# Patient Record
Sex: Female | Born: 1950 | Race: Black or African American | Hispanic: No | Smoking: Former smoker
Health system: Southern US, Community
[De-identification: ages and names within clinical notes are randomized; demographics above are authoritative.]

## PROBLEM LIST (undated history)

## (undated) DIAGNOSIS — I509 Heart failure, unspecified: Secondary | ICD-10-CM

## (undated) DIAGNOSIS — C50919 Malignant neoplasm of unspecified site of unspecified female breast: Secondary | ICD-10-CM

## (undated) DIAGNOSIS — M1990 Unspecified osteoarthritis, unspecified site: Secondary | ICD-10-CM

## (undated) DIAGNOSIS — F419 Anxiety disorder, unspecified: Secondary | ICD-10-CM

## (undated) DIAGNOSIS — F329 Major depressive disorder, single episode, unspecified: Secondary | ICD-10-CM

## (undated) DIAGNOSIS — Z923 Personal history of irradiation: Secondary | ICD-10-CM

## (undated) DIAGNOSIS — F32A Depression, unspecified: Secondary | ICD-10-CM

## (undated) DIAGNOSIS — E7800 Pure hypercholesterolemia, unspecified: Secondary | ICD-10-CM

## (undated) DIAGNOSIS — R7401 Elevation of levels of liver transaminase levels: Secondary | ICD-10-CM

## (undated) DIAGNOSIS — K3184 Gastroparesis: Secondary | ICD-10-CM

## (undated) DIAGNOSIS — K5090 Crohn's disease, unspecified, without complications: Secondary | ICD-10-CM

## (undated) DIAGNOSIS — G2581 Restless legs syndrome: Secondary | ICD-10-CM

## (undated) DIAGNOSIS — R7402 Elevation of levels of lactic acid dehydrogenase (LDH): Secondary | ICD-10-CM

## (undated) DIAGNOSIS — M545 Low back pain, unspecified: Secondary | ICD-10-CM

## (undated) DIAGNOSIS — N814 Uterovaginal prolapse, unspecified: Secondary | ICD-10-CM

## (undated) DIAGNOSIS — R740 Nonspecific elevation of levels of transaminase and lactic acid dehydrogenase [LDH]: Secondary | ICD-10-CM

## (undated) DIAGNOSIS — I10 Essential (primary) hypertension: Secondary | ICD-10-CM

## (undated) DIAGNOSIS — G459 Transient cerebral ischemic attack, unspecified: Secondary | ICD-10-CM

## (undated) DIAGNOSIS — R350 Frequency of micturition: Secondary | ICD-10-CM

## (undated) DIAGNOSIS — E569 Vitamin deficiency, unspecified: Secondary | ICD-10-CM

## (undated) HISTORY — PX: ARTHROPLASTY: SHX135

## (undated) HISTORY — DX: Crohn's disease, unspecified, without complications: K50.90

## (undated) HISTORY — DX: Elevation of levels of liver transaminase levels: R74.01

## (undated) HISTORY — DX: Personal history of irradiation: Z92.3

## (undated) HISTORY — DX: Restless legs syndrome: G25.81

## (undated) HISTORY — DX: Unspecified osteoarthritis, unspecified site: M19.90

## (undated) HISTORY — DX: Vitamin deficiency, unspecified: E56.9

## (undated) HISTORY — PX: TOTAL KNEE ARTHROPLASTY: SHX125

## (undated) HISTORY — DX: Elevation of levels of lactic acid dehydrogenase (LDH): R74.02

## (undated) HISTORY — DX: Gastroparesis: K31.84

## (undated) HISTORY — DX: Nonspecific elevation of levels of transaminase and lactic acid dehydrogenase (ldh): R74.0

## (undated) HISTORY — DX: Major depressive disorder, single episode, unspecified: F32.9

## (undated) HISTORY — DX: Low back pain, unspecified: M54.50

## (undated) HISTORY — PX: WRIST SURGERY: SHX841

## (undated) HISTORY — DX: Malignant neoplasm of unspecified site of unspecified female breast: C50.919

## (undated) HISTORY — DX: Heart failure, unspecified: I50.9

## (undated) HISTORY — DX: Depression, unspecified: F32.A

## (undated) HISTORY — DX: Uterovaginal prolapse, unspecified: N81.4

## (undated) HISTORY — DX: Low back pain: M54.5

## (undated) HISTORY — DX: Pure hypercholesterolemia, unspecified: E78.00

## (undated) HISTORY — DX: Essential (primary) hypertension: I10

## (undated) HISTORY — DX: Transient cerebral ischemic attack, unspecified: G45.9

## (undated) HISTORY — DX: Anxiety disorder, unspecified: F41.9

## (undated) HISTORY — DX: Frequency of micturition: R35.0

---

## 1995-09-08 HISTORY — PX: ABDOMINAL HYSTERECTOMY: SHX81

## 1995-09-08 HISTORY — PX: RADICAL HYSTERECTOMY: SHX2283

## 1997-12-26 ENCOUNTER — Encounter: Admission: RE | Admit: 1997-12-26 | Discharge: 1997-12-26 | Payer: Self-pay | Admitting: Family Medicine

## 1998-02-20 ENCOUNTER — Ambulatory Visit: Admission: RE | Admit: 1998-02-20 | Discharge: 1998-02-20 | Payer: Self-pay | Admitting: Gynecology

## 1998-08-13 ENCOUNTER — Other Ambulatory Visit: Admission: RE | Admit: 1998-08-13 | Discharge: 1998-08-13 | Payer: Self-pay | Admitting: Gynecology

## 1998-08-13 ENCOUNTER — Ambulatory Visit: Admission: RE | Admit: 1998-08-13 | Discharge: 1998-08-13 | Payer: Self-pay | Admitting: Gynecology

## 1998-08-16 ENCOUNTER — Encounter: Admission: RE | Admit: 1998-08-16 | Discharge: 1998-08-16 | Payer: Self-pay | Admitting: Family Medicine

## 1998-09-04 ENCOUNTER — Encounter: Admission: RE | Admit: 1998-09-04 | Discharge: 1998-09-04 | Payer: Self-pay | Admitting: Sports Medicine

## 1999-03-03 ENCOUNTER — Encounter: Admission: RE | Admit: 1999-03-03 | Discharge: 1999-03-03 | Payer: Self-pay | Admitting: Family Medicine

## 1999-03-10 ENCOUNTER — Encounter: Admission: RE | Admit: 1999-03-10 | Discharge: 1999-03-10 | Payer: Self-pay | Admitting: Family Medicine

## 1999-03-18 ENCOUNTER — Ambulatory Visit: Admission: RE | Admit: 1999-03-18 | Discharge: 1999-03-18 | Payer: Self-pay | Admitting: Gynecology

## 1999-03-18 ENCOUNTER — Other Ambulatory Visit: Admission: RE | Admit: 1999-03-18 | Discharge: 1999-03-18 | Payer: Self-pay | Admitting: Gynecology

## 1999-03-21 ENCOUNTER — Encounter: Admission: RE | Admit: 1999-03-21 | Discharge: 1999-03-21 | Payer: Self-pay | Admitting: Family Medicine

## 1999-06-09 ENCOUNTER — Encounter: Admission: RE | Admit: 1999-06-09 | Discharge: 1999-06-09 | Payer: Self-pay | Admitting: Family Medicine

## 1999-06-12 ENCOUNTER — Encounter: Admission: RE | Admit: 1999-06-12 | Discharge: 1999-06-12 | Payer: Self-pay | Admitting: Family Medicine

## 1999-07-09 ENCOUNTER — Encounter: Admission: RE | Admit: 1999-07-09 | Discharge: 1999-07-09 | Payer: Self-pay | Admitting: Family Medicine

## 1999-09-05 ENCOUNTER — Encounter: Admission: RE | Admit: 1999-09-05 | Discharge: 1999-09-05 | Payer: Self-pay | Admitting: Family Medicine

## 1999-09-12 ENCOUNTER — Encounter: Admission: RE | Admit: 1999-09-12 | Discharge: 1999-09-12 | Payer: Self-pay | Admitting: Sports Medicine

## 1999-09-12 ENCOUNTER — Encounter: Payer: Self-pay | Admitting: Sports Medicine

## 1999-10-08 ENCOUNTER — Encounter: Admission: RE | Admit: 1999-10-08 | Discharge: 1999-10-08 | Payer: Self-pay | Admitting: Family Medicine

## 1999-10-22 ENCOUNTER — Encounter: Admission: RE | Admit: 1999-10-22 | Discharge: 2000-01-20 | Payer: Self-pay | Admitting: *Deleted

## 1999-12-05 ENCOUNTER — Encounter: Admission: RE | Admit: 1999-12-05 | Discharge: 1999-12-05 | Payer: Self-pay | Admitting: Sports Medicine

## 2000-03-01 ENCOUNTER — Encounter: Admission: RE | Admit: 2000-03-01 | Discharge: 2000-03-01 | Payer: Self-pay | Admitting: Family Medicine

## 2000-03-04 ENCOUNTER — Encounter: Admission: RE | Admit: 2000-03-04 | Discharge: 2000-03-04 | Payer: Self-pay | Admitting: Family Medicine

## 2000-03-15 ENCOUNTER — Encounter: Admission: RE | Admit: 2000-03-15 | Discharge: 2000-03-15 | Payer: Self-pay | Admitting: Family Medicine

## 2000-03-17 ENCOUNTER — Ambulatory Visit: Admission: RE | Admit: 2000-03-17 | Discharge: 2000-03-17 | Payer: Self-pay | Admitting: Gynecology

## 2000-03-17 ENCOUNTER — Other Ambulatory Visit: Admission: RE | Admit: 2000-03-17 | Discharge: 2000-03-17 | Payer: Self-pay | Admitting: Gynecology

## 2000-03-19 ENCOUNTER — Encounter: Admission: RE | Admit: 2000-03-19 | Discharge: 2000-03-19 | Payer: Self-pay | Admitting: *Deleted

## 2000-03-25 ENCOUNTER — Encounter: Admission: RE | Admit: 2000-03-25 | Discharge: 2000-03-25 | Payer: Self-pay | Admitting: Family Medicine

## 2000-04-16 ENCOUNTER — Encounter: Admission: RE | Admit: 2000-04-16 | Discharge: 2000-04-16 | Payer: Self-pay | Admitting: Family Medicine

## 2000-05-07 ENCOUNTER — Encounter: Admission: RE | Admit: 2000-05-07 | Discharge: 2000-05-07 | Payer: Self-pay | Admitting: *Deleted

## 2000-06-01 ENCOUNTER — Encounter: Admission: RE | Admit: 2000-06-01 | Discharge: 2000-06-01 | Payer: Self-pay | Admitting: Family Medicine

## 2000-06-01 ENCOUNTER — Encounter: Payer: Self-pay | Admitting: Family Medicine

## 2000-06-15 ENCOUNTER — Encounter: Admission: RE | Admit: 2000-06-15 | Discharge: 2000-06-15 | Payer: Self-pay | Admitting: Family Medicine

## 2000-07-16 ENCOUNTER — Encounter: Admission: RE | Admit: 2000-07-16 | Discharge: 2000-07-16 | Payer: Self-pay | Admitting: Family Medicine

## 2000-08-27 ENCOUNTER — Encounter: Admission: RE | Admit: 2000-08-27 | Discharge: 2000-08-27 | Payer: Self-pay | Admitting: Family Medicine

## 2000-08-27 ENCOUNTER — Inpatient Hospital Stay (HOSPITAL_COMMUNITY): Admission: AD | Admit: 2000-08-27 | Discharge: 2000-08-30 | Payer: Self-pay | Admitting: Family Medicine

## 2000-09-13 ENCOUNTER — Encounter: Admission: RE | Admit: 2000-09-13 | Discharge: 2000-09-13 | Payer: Self-pay | Admitting: Family Medicine

## 2000-12-02 ENCOUNTER — Encounter: Admission: RE | Admit: 2000-12-02 | Discharge: 2000-12-02 | Payer: Self-pay | Admitting: Family Medicine

## 2000-12-10 ENCOUNTER — Encounter: Admission: RE | Admit: 2000-12-10 | Discharge: 2000-12-10 | Payer: Self-pay | Admitting: Sports Medicine

## 2000-12-10 ENCOUNTER — Encounter: Payer: Self-pay | Admitting: Sports Medicine

## 2001-03-08 ENCOUNTER — Encounter: Admission: RE | Admit: 2001-03-08 | Discharge: 2001-03-08 | Payer: Self-pay | Admitting: Family Medicine

## 2001-05-10 ENCOUNTER — Other Ambulatory Visit: Admission: RE | Admit: 2001-05-10 | Discharge: 2001-05-10 | Payer: Self-pay | Admitting: Gynecology

## 2001-05-10 ENCOUNTER — Encounter (INDEPENDENT_AMBULATORY_CARE_PROVIDER_SITE_OTHER): Payer: Self-pay | Admitting: *Deleted

## 2001-05-10 ENCOUNTER — Ambulatory Visit: Admission: RE | Admit: 2001-05-10 | Discharge: 2001-05-10 | Payer: Self-pay | Admitting: Gynecology

## 2001-05-27 ENCOUNTER — Encounter: Payer: Self-pay | Admitting: Sports Medicine

## 2001-05-27 ENCOUNTER — Encounter: Admission: RE | Admit: 2001-05-27 | Discharge: 2001-05-27 | Payer: Self-pay | Admitting: *Deleted

## 2001-06-03 ENCOUNTER — Encounter: Admission: RE | Admit: 2001-06-03 | Discharge: 2001-06-03 | Payer: Self-pay | Admitting: Family Medicine

## 2001-06-10 ENCOUNTER — Encounter: Payer: Self-pay | Admitting: Sports Medicine

## 2001-06-10 ENCOUNTER — Encounter: Admission: RE | Admit: 2001-06-10 | Discharge: 2001-06-10 | Payer: Self-pay | Admitting: Sports Medicine

## 2001-09-02 ENCOUNTER — Encounter: Admission: RE | Admit: 2001-09-02 | Discharge: 2001-09-02 | Payer: Self-pay | Admitting: Family Medicine

## 2001-12-02 ENCOUNTER — Encounter: Admission: RE | Admit: 2001-12-02 | Discharge: 2001-12-02 | Payer: Self-pay | Admitting: Family Medicine

## 2002-02-28 ENCOUNTER — Encounter: Admission: RE | Admit: 2002-02-28 | Discharge: 2002-02-28 | Payer: Self-pay | Admitting: Family Medicine

## 2002-06-02 ENCOUNTER — Encounter: Admission: RE | Admit: 2002-06-02 | Discharge: 2002-06-02 | Payer: Self-pay | Admitting: Family Medicine

## 2002-06-09 ENCOUNTER — Encounter: Admission: RE | Admit: 2002-06-09 | Discharge: 2002-06-09 | Payer: Self-pay | Admitting: Sports Medicine

## 2002-06-09 ENCOUNTER — Encounter: Payer: Self-pay | Admitting: Sports Medicine

## 2002-06-21 ENCOUNTER — Encounter (INDEPENDENT_AMBULATORY_CARE_PROVIDER_SITE_OTHER): Payer: Self-pay | Admitting: Specialist

## 2002-06-21 ENCOUNTER — Ambulatory Visit: Admission: RE | Admit: 2002-06-21 | Discharge: 2002-06-21 | Payer: Self-pay | Admitting: Gynecology

## 2002-06-21 ENCOUNTER — Other Ambulatory Visit: Admission: RE | Admit: 2002-06-21 | Discharge: 2002-06-21 | Payer: Self-pay | Admitting: Gynecology

## 2002-09-04 ENCOUNTER — Encounter: Admission: RE | Admit: 2002-09-04 | Discharge: 2002-09-04 | Payer: Self-pay | Admitting: Family Medicine

## 2002-12-04 ENCOUNTER — Encounter: Admission: RE | Admit: 2002-12-04 | Discharge: 2002-12-04 | Payer: Self-pay | Admitting: Family Medicine

## 2003-03-01 ENCOUNTER — Encounter: Admission: RE | Admit: 2003-03-01 | Discharge: 2003-03-01 | Payer: Self-pay | Admitting: Family Medicine

## 2003-06-05 ENCOUNTER — Encounter: Admission: RE | Admit: 2003-06-05 | Discharge: 2003-06-05 | Payer: Self-pay | Admitting: Family Medicine

## 2003-06-13 ENCOUNTER — Encounter: Admission: RE | Admit: 2003-06-13 | Discharge: 2003-06-13 | Payer: Self-pay | Admitting: Family Medicine

## 2003-06-22 ENCOUNTER — Encounter: Payer: Self-pay | Admitting: Sports Medicine

## 2003-06-22 ENCOUNTER — Encounter: Admission: RE | Admit: 2003-06-22 | Discharge: 2003-06-22 | Payer: Self-pay | Admitting: Sports Medicine

## 2003-07-11 ENCOUNTER — Encounter (INDEPENDENT_AMBULATORY_CARE_PROVIDER_SITE_OTHER): Payer: Self-pay | Admitting: *Deleted

## 2003-07-11 ENCOUNTER — Other Ambulatory Visit: Admission: RE | Admit: 2003-07-11 | Discharge: 2003-07-11 | Payer: Self-pay | Admitting: Gynecology

## 2003-07-11 ENCOUNTER — Ambulatory Visit: Admission: RE | Admit: 2003-07-11 | Discharge: 2003-07-11 | Payer: Self-pay | Admitting: Gynecology

## 2003-09-09 ENCOUNTER — Emergency Department (HOSPITAL_COMMUNITY): Admission: AD | Admit: 2003-09-09 | Discharge: 2003-09-09 | Payer: Self-pay | Admitting: Family Medicine

## 2003-09-14 ENCOUNTER — Encounter: Admission: RE | Admit: 2003-09-14 | Discharge: 2003-09-14 | Payer: Self-pay | Admitting: Family Medicine

## 2003-11-12 ENCOUNTER — Encounter: Admission: RE | Admit: 2003-11-12 | Discharge: 2003-11-12 | Payer: Self-pay | Admitting: Family Medicine

## 2004-02-01 ENCOUNTER — Encounter: Admission: RE | Admit: 2004-02-01 | Discharge: 2004-02-01 | Payer: Self-pay | Admitting: Family Medicine

## 2004-02-21 ENCOUNTER — Encounter: Admission: RE | Admit: 2004-02-21 | Discharge: 2004-02-21 | Payer: Self-pay | Admitting: Family Medicine

## 2004-02-22 ENCOUNTER — Encounter: Admission: RE | Admit: 2004-02-22 | Discharge: 2004-02-22 | Payer: Self-pay

## 2004-05-14 ENCOUNTER — Ambulatory Visit: Payer: Self-pay | Admitting: Family Medicine

## 2004-07-11 ENCOUNTER — Encounter: Admission: RE | Admit: 2004-07-11 | Discharge: 2004-07-11 | Payer: Self-pay | Admitting: Sports Medicine

## 2004-08-05 ENCOUNTER — Encounter (INDEPENDENT_AMBULATORY_CARE_PROVIDER_SITE_OTHER): Payer: Self-pay | Admitting: *Deleted

## 2004-08-05 ENCOUNTER — Other Ambulatory Visit: Admission: RE | Admit: 2004-08-05 | Discharge: 2004-08-05 | Payer: Self-pay | Admitting: Gynecology

## 2004-08-05 ENCOUNTER — Ambulatory Visit: Admission: RE | Admit: 2004-08-05 | Discharge: 2004-08-05 | Payer: Self-pay | Admitting: Gynecology

## 2004-08-12 ENCOUNTER — Ambulatory Visit: Payer: Self-pay | Admitting: Family Medicine

## 2004-08-21 ENCOUNTER — Ambulatory Visit (HOSPITAL_COMMUNITY): Admission: RE | Admit: 2004-08-21 | Discharge: 2004-08-21 | Payer: Self-pay | Admitting: Family Medicine

## 2004-08-21 ENCOUNTER — Ambulatory Visit: Payer: Self-pay | Admitting: Family Medicine

## 2004-09-12 ENCOUNTER — Ambulatory Visit: Payer: Self-pay | Admitting: Cardiology

## 2004-09-16 ENCOUNTER — Ambulatory Visit: Payer: Self-pay

## 2004-09-17 ENCOUNTER — Inpatient Hospital Stay (HOSPITAL_COMMUNITY): Admission: RE | Admit: 2004-09-17 | Discharge: 2004-09-21 | Payer: Self-pay | Admitting: Orthopedic Surgery

## 2004-10-01 ENCOUNTER — Ambulatory Visit: Payer: Self-pay | Admitting: Family Medicine

## 2004-10-13 ENCOUNTER — Ambulatory Visit: Payer: Self-pay | Admitting: Sports Medicine

## 2004-10-13 ENCOUNTER — Ambulatory Visit: Payer: Self-pay | Admitting: Cardiology

## 2004-10-20 ENCOUNTER — Ambulatory Visit: Payer: Self-pay | Admitting: Sports Medicine

## 2004-11-13 ENCOUNTER — Ambulatory Visit: Payer: Self-pay | Admitting: Internal Medicine

## 2004-12-04 ENCOUNTER — Ambulatory Visit: Payer: Self-pay | Admitting: Internal Medicine

## 2004-12-25 ENCOUNTER — Ambulatory Visit: Payer: Self-pay | Admitting: Cardiology

## 2005-01-28 ENCOUNTER — Ambulatory Visit: Payer: Self-pay | Admitting: Cardiology

## 2005-03-13 ENCOUNTER — Ambulatory Visit: Payer: Self-pay | Admitting: Cardiology

## 2005-03-27 ENCOUNTER — Ambulatory Visit: Payer: Self-pay | Admitting: Family Medicine

## 2005-06-09 ENCOUNTER — Ambulatory Visit: Payer: Self-pay | Admitting: Internal Medicine

## 2005-07-16 ENCOUNTER — Ambulatory Visit: Payer: Self-pay | Admitting: Family Medicine

## 2005-08-07 ENCOUNTER — Encounter (INDEPENDENT_AMBULATORY_CARE_PROVIDER_SITE_OTHER): Payer: Self-pay | Admitting: *Deleted

## 2005-08-07 ENCOUNTER — Other Ambulatory Visit: Admission: RE | Admit: 2005-08-07 | Discharge: 2005-08-07 | Payer: Self-pay | Admitting: Gynecology

## 2005-08-07 ENCOUNTER — Ambulatory Visit: Admission: RE | Admit: 2005-08-07 | Discharge: 2005-08-07 | Payer: Self-pay | Admitting: Gynecology

## 2005-08-07 LAB — CONVERTED CEMR LAB

## 2005-08-14 ENCOUNTER — Encounter: Admission: RE | Admit: 2005-08-14 | Discharge: 2005-08-14 | Payer: Self-pay | Admitting: Sports Medicine

## 2005-08-19 ENCOUNTER — Ambulatory Visit: Payer: Self-pay | Admitting: Family Medicine

## 2005-09-21 ENCOUNTER — Ambulatory Visit: Payer: Self-pay | Admitting: Cardiology

## 2005-10-07 ENCOUNTER — Inpatient Hospital Stay (HOSPITAL_COMMUNITY): Admission: RE | Admit: 2005-10-07 | Discharge: 2005-10-11 | Payer: Self-pay | Admitting: Orthopedic Surgery

## 2006-02-09 ENCOUNTER — Ambulatory Visit: Payer: Self-pay | Admitting: Family Medicine

## 2006-04-05 ENCOUNTER — Ambulatory Visit: Payer: Self-pay | Admitting: Family Medicine

## 2006-04-30 ENCOUNTER — Ambulatory Visit: Payer: Self-pay | Admitting: Cardiology

## 2006-09-07 DIAGNOSIS — C50919 Malignant neoplasm of unspecified site of unspecified female breast: Secondary | ICD-10-CM

## 2006-09-07 HISTORY — PX: TOTAL KNEE ARTHROPLASTY: SHX125

## 2006-09-07 HISTORY — DX: Malignant neoplasm of unspecified site of unspecified female breast: C50.919

## 2006-09-17 ENCOUNTER — Encounter (INDEPENDENT_AMBULATORY_CARE_PROVIDER_SITE_OTHER): Payer: Self-pay | Admitting: Family Medicine

## 2006-09-17 ENCOUNTER — Ambulatory Visit: Payer: Self-pay | Admitting: Family Medicine

## 2006-09-17 ENCOUNTER — Encounter: Admission: RE | Admit: 2006-09-17 | Discharge: 2006-09-17 | Payer: Self-pay | Admitting: Sports Medicine

## 2006-09-17 LAB — CONVERTED CEMR LAB
ALT: 13 units/L (ref 0–35)
Alkaline Phosphatase: 104 units/L (ref 39–117)
CO2: 26 meq/L (ref 19–32)
Creatinine, Ser: 0.87 mg/dL (ref 0.40–1.20)
Total Bilirubin: 0.5 mg/dL (ref 0.3–1.2)

## 2006-09-24 ENCOUNTER — Encounter: Admission: RE | Admit: 2006-09-24 | Discharge: 2006-09-24 | Payer: Self-pay | Admitting: Sports Medicine

## 2006-10-08 ENCOUNTER — Encounter: Admission: RE | Admit: 2006-10-08 | Discharge: 2006-10-08 | Payer: Self-pay | Admitting: Sports Medicine

## 2006-10-15 ENCOUNTER — Encounter (INDEPENDENT_AMBULATORY_CARE_PROVIDER_SITE_OTHER): Payer: Self-pay | Admitting: Diagnostic Radiology

## 2006-10-15 ENCOUNTER — Encounter: Admission: RE | Admit: 2006-10-15 | Discharge: 2006-10-15 | Payer: Self-pay | Admitting: Sports Medicine

## 2006-10-15 ENCOUNTER — Encounter (INDEPENDENT_AMBULATORY_CARE_PROVIDER_SITE_OTHER): Payer: Self-pay | Admitting: Specialist

## 2006-10-29 ENCOUNTER — Encounter: Admission: RE | Admit: 2006-10-29 | Discharge: 2006-10-29 | Payer: Self-pay | Admitting: Sports Medicine

## 2006-11-04 DIAGNOSIS — N951 Menopausal and female climacteric states: Secondary | ICD-10-CM

## 2006-11-04 DIAGNOSIS — G4700 Insomnia, unspecified: Secondary | ICD-10-CM

## 2006-11-04 DIAGNOSIS — R141 Gas pain: Secondary | ICD-10-CM | POA: Insufficient documentation

## 2006-11-04 DIAGNOSIS — R143 Flatulence: Secondary | ICD-10-CM

## 2006-11-04 DIAGNOSIS — R142 Eructation: Secondary | ICD-10-CM

## 2006-11-04 DIAGNOSIS — E0821 Diabetes mellitus due to underlying condition with diabetic nephropathy: Secondary | ICD-10-CM

## 2006-11-04 DIAGNOSIS — I428 Other cardiomyopathies: Secondary | ICD-10-CM

## 2006-11-04 DIAGNOSIS — E7800 Pure hypercholesterolemia, unspecified: Secondary | ICD-10-CM | POA: Insufficient documentation

## 2006-11-04 DIAGNOSIS — M1710 Unilateral primary osteoarthritis, unspecified knee: Secondary | ICD-10-CM

## 2006-11-04 DIAGNOSIS — I10 Essential (primary) hypertension: Secondary | ICD-10-CM | POA: Insufficient documentation

## 2006-11-04 DIAGNOSIS — I429 Cardiomyopathy, unspecified: Secondary | ICD-10-CM | POA: Insufficient documentation

## 2006-11-05 ENCOUNTER — Encounter (INDEPENDENT_AMBULATORY_CARE_PROVIDER_SITE_OTHER): Payer: Self-pay | Admitting: *Deleted

## 2006-11-05 ENCOUNTER — Encounter (INDEPENDENT_AMBULATORY_CARE_PROVIDER_SITE_OTHER): Payer: Self-pay | Admitting: Family Medicine

## 2006-11-05 ENCOUNTER — Ambulatory Visit: Payer: Self-pay | Admitting: Family Medicine

## 2006-11-07 ENCOUNTER — Ambulatory Visit: Payer: Self-pay | Admitting: Internal Medicine

## 2006-11-09 ENCOUNTER — Ambulatory Visit (HOSPITAL_BASED_OUTPATIENT_CLINIC_OR_DEPARTMENT_OTHER): Admission: RE | Admit: 2006-11-09 | Discharge: 2006-11-09 | Payer: Self-pay | Admitting: Family Medicine

## 2006-11-17 ENCOUNTER — Encounter: Admission: RE | Admit: 2006-11-17 | Discharge: 2006-11-17 | Payer: Self-pay | Admitting: Surgery

## 2006-11-17 ENCOUNTER — Ambulatory Visit (HOSPITAL_COMMUNITY): Admission: RE | Admit: 2006-11-17 | Discharge: 2006-11-17 | Payer: Self-pay | Admitting: Surgery

## 2006-11-17 ENCOUNTER — Encounter (INDEPENDENT_AMBULATORY_CARE_PROVIDER_SITE_OTHER): Payer: Self-pay | Admitting: Specialist

## 2006-11-17 HISTORY — PX: BREAST LUMPECTOMY: SHX2

## 2006-12-03 ENCOUNTER — Encounter (INDEPENDENT_AMBULATORY_CARE_PROVIDER_SITE_OTHER): Payer: Self-pay | Admitting: *Deleted

## 2006-12-03 ENCOUNTER — Other Ambulatory Visit: Admission: RE | Admit: 2006-12-03 | Discharge: 2006-12-03 | Payer: Self-pay | Admitting: Gynecology

## 2006-12-03 ENCOUNTER — Ambulatory Visit: Admission: RE | Admit: 2006-12-03 | Discharge: 2006-12-03 | Payer: Self-pay | Admitting: Gynecology

## 2006-12-06 ENCOUNTER — Encounter (INDEPENDENT_AMBULATORY_CARE_PROVIDER_SITE_OTHER): Payer: Self-pay | Admitting: Family Medicine

## 2007-01-03 ENCOUNTER — Ambulatory Visit: Payer: Self-pay | Admitting: Family Medicine

## 2007-01-03 DIAGNOSIS — C50919 Malignant neoplasm of unspecified site of unspecified female breast: Secondary | ICD-10-CM | POA: Insufficient documentation

## 2007-01-03 DIAGNOSIS — M545 Low back pain: Secondary | ICD-10-CM | POA: Insufficient documentation

## 2007-01-03 LAB — CONVERTED CEMR LAB: Hgb A1c MFr Bld: 9.8 %

## 2007-01-05 ENCOUNTER — Telehealth (INDEPENDENT_AMBULATORY_CARE_PROVIDER_SITE_OTHER): Payer: Self-pay | Admitting: Family Medicine

## 2007-01-07 ENCOUNTER — Telehealth: Payer: Self-pay | Admitting: *Deleted

## 2007-01-21 ENCOUNTER — Ambulatory Visit: Payer: Self-pay | Admitting: Family Medicine

## 2007-01-21 ENCOUNTER — Encounter (INDEPENDENT_AMBULATORY_CARE_PROVIDER_SITE_OTHER): Payer: Self-pay | Admitting: Family Medicine

## 2007-01-21 LAB — CONVERTED CEMR LAB
Cholesterol: 163 mg/dL (ref 0–200)
HDL: 71 mg/dL (ref >39)
LDL Cholesterol: 76 mg/dL (ref 0–99)
Triglycerides: 82 mg/dL (ref <150)
VLDL: 16 mg/dL (ref 0–40)

## 2007-02-04 ENCOUNTER — Encounter (INDEPENDENT_AMBULATORY_CARE_PROVIDER_SITE_OTHER): Payer: Self-pay | Admitting: Surgery

## 2007-02-04 ENCOUNTER — Ambulatory Visit (HOSPITAL_COMMUNITY): Admission: RE | Admit: 2007-02-04 | Discharge: 2007-02-04 | Payer: Self-pay | Admitting: Surgery

## 2007-03-01 ENCOUNTER — Ambulatory Visit: Payer: Self-pay | Admitting: Oncology

## 2007-03-21 ENCOUNTER — Ambulatory Visit: Admission: RE | Admit: 2007-03-21 | Discharge: 2007-06-02 | Payer: Self-pay | Admitting: *Deleted

## 2007-06-07 ENCOUNTER — Ambulatory Visit: Admission: RE | Admit: 2007-06-07 | Discharge: 2007-06-07 | Payer: Self-pay | Admitting: Orthopedic Surgery

## 2007-06-16 ENCOUNTER — Ambulatory Visit: Payer: Self-pay | Admitting: Cardiology

## 2007-07-01 ENCOUNTER — Inpatient Hospital Stay (HOSPITAL_COMMUNITY): Admission: RE | Admit: 2007-07-01 | Discharge: 2007-07-04 | Payer: Self-pay | Admitting: Orthopedic Surgery

## 2007-08-17 ENCOUNTER — Encounter (INDEPENDENT_AMBULATORY_CARE_PROVIDER_SITE_OTHER): Payer: Self-pay | Admitting: *Deleted

## 2007-08-17 ENCOUNTER — Ambulatory Visit: Payer: Self-pay | Admitting: Family Medicine

## 2007-08-17 ENCOUNTER — Encounter (INDEPENDENT_AMBULATORY_CARE_PROVIDER_SITE_OTHER): Payer: Self-pay | Admitting: Family Medicine

## 2007-08-17 LAB — CONVERTED CEMR LAB
ALT: <8 units/L (ref 0–35)
AST: 12 units/L (ref 0–37)
CO2: 23 meq/L (ref 19–32)
Chloride: 106 meq/L (ref 96–112)
Cholesterol: 177 mg/dL (ref 0–200)
LDL Cholesterol: 83 mg/dL (ref 0–99)
Potassium: 3.8 meq/L (ref 3.5–5.3)
Total Bilirubin: 0.4 mg/dL (ref 0.3–1.2)
Total CHOL/HDL Ratio: 2.2
Total Protein: 7.8 g/dL (ref 6.0–8.3)

## 2007-09-16 ENCOUNTER — Ambulatory Visit: Payer: Self-pay | Admitting: Family Medicine

## 2007-10-10 ENCOUNTER — Encounter: Admission: RE | Admit: 2007-10-10 | Discharge: 2007-10-10 | Payer: Self-pay | Admitting: Internal Medicine

## 2007-10-12 ENCOUNTER — Telehealth: Payer: Self-pay | Admitting: *Deleted

## 2007-11-23 ENCOUNTER — Ambulatory Visit: Payer: Self-pay | Admitting: Oncology

## 2007-11-23 ENCOUNTER — Ambulatory Visit: Admission: RE | Admit: 2007-11-23 | Discharge: 2007-11-23 | Payer: Self-pay | Admitting: Radiation Oncology

## 2007-11-23 LAB — CBC WITH DIFFERENTIAL/PLATELET
Basophils Absolute: 0 10*3/uL (ref 0.0–0.1)
Eosinophils Absolute: 0.1 10*3/uL (ref 0.0–0.5)
HGB: 10.9 g/dL — ABNORMAL LOW (ref 11.6–15.9)
MCV: 85.4 fL (ref 81.0–101.0)
MONO#: 0.4 10*3/uL (ref 0.1–0.9)
NEUT#: 3.3 10*3/uL (ref 1.5–6.5)
RBC: 3.75 10*6/uL (ref 3.70–5.32)
RDW: 16.3 % — ABNORMAL HIGH (ref 11.3–14.5)
WBC: 5.3 10*3/uL (ref 3.9–10.0)

## 2007-12-13 ENCOUNTER — Ambulatory Visit: Admission: RE | Admit: 2007-12-13 | Discharge: 2007-12-13 | Payer: Self-pay | Admitting: Gynecology

## 2007-12-13 ENCOUNTER — Other Ambulatory Visit: Admission: RE | Admit: 2007-12-13 | Discharge: 2007-12-13 | Payer: Self-pay | Admitting: Gynecology

## 2007-12-13 ENCOUNTER — Encounter: Payer: Self-pay | Admitting: Gynecology

## 2007-12-13 LAB — CONVERTED CEMR LAB: Pap Smear: NORMAL

## 2008-01-02 ENCOUNTER — Encounter: Payer: Self-pay | Admitting: Family Medicine

## 2008-01-07 ENCOUNTER — Emergency Department (HOSPITAL_COMMUNITY): Admission: EM | Admit: 2008-01-07 | Discharge: 2008-01-07 | Payer: Self-pay | Admitting: Family Medicine

## 2008-02-09 ENCOUNTER — Encounter: Admission: RE | Admit: 2008-02-09 | Discharge: 2008-02-09 | Payer: Self-pay | Admitting: Family Medicine

## 2008-02-09 ENCOUNTER — Ambulatory Visit: Payer: Self-pay | Admitting: Family Medicine

## 2008-02-09 DIAGNOSIS — R0789 Other chest pain: Secondary | ICD-10-CM | POA: Insufficient documentation

## 2008-03-27 ENCOUNTER — Ambulatory Visit (HOSPITAL_COMMUNITY): Admission: RE | Admit: 2008-03-27 | Discharge: 2008-03-27 | Payer: Self-pay | Admitting: Radiation Oncology

## 2008-04-19 ENCOUNTER — Ambulatory Visit: Payer: Self-pay | Admitting: Family Medicine

## 2008-06-22 ENCOUNTER — Ambulatory Visit: Payer: Self-pay | Admitting: Cardiology

## 2008-07-06 ENCOUNTER — Encounter: Payer: Self-pay | Admitting: Cardiology

## 2008-07-06 ENCOUNTER — Ambulatory Visit: Payer: Self-pay

## 2008-10-22 ENCOUNTER — Ambulatory Visit: Payer: Self-pay | Admitting: Family Medicine

## 2008-10-22 ENCOUNTER — Encounter: Payer: Self-pay | Admitting: Family Medicine

## 2008-10-26 ENCOUNTER — Encounter: Payer: Self-pay | Admitting: Family Medicine

## 2008-10-26 ENCOUNTER — Ambulatory Visit: Payer: Self-pay | Admitting: Family Medicine

## 2008-10-26 ENCOUNTER — Encounter: Admission: RE | Admit: 2008-10-26 | Discharge: 2008-10-26 | Payer: Self-pay | Admitting: Surgery

## 2008-10-26 LAB — CONVERTED CEMR LAB
ALT: 16 units/L (ref 0–35)
Albumin: 4.3 g/dL (ref 3.5–5.2)
BUN: 33 mg/dL — ABNORMAL HIGH (ref 6–23)
BUN: 24 mg/dL — ABNORMAL HIGH (ref 6–23)
CO2: 18 meq/L — ABNORMAL LOW (ref 19–32)
Calcium: 9.2 mg/dL (ref 8.4–10.5)
Creatinine, Ser: 1.57 mg/dL — ABNORMAL HIGH (ref 0.40–1.20)
Glucose, Bld: 121 mg/dL — ABNORMAL HIGH (ref 70–99)
HDL: 72 mg/dL (ref >39)
LDL Cholesterol: 76 mg/dL (ref 0–99)
Total Bilirubin: 0.3 mg/dL (ref 0.3–1.2)
Total CHOL/HDL Ratio: 2.3
Total Protein: 7.1 g/dL (ref 6.0–8.3)
VLDL: 19 mg/dL (ref 0–40)

## 2009-02-01 ENCOUNTER — Ambulatory Visit: Admission: RE | Admit: 2009-02-01 | Discharge: 2009-02-01 | Payer: Self-pay | Admitting: Gynecology

## 2009-05-29 ENCOUNTER — Encounter: Payer: Self-pay | Admitting: Family Medicine

## 2009-05-29 ENCOUNTER — Ambulatory Visit: Payer: Self-pay | Admitting: Family Medicine

## 2009-05-29 DIAGNOSIS — M19049 Primary osteoarthritis, unspecified hand: Secondary | ICD-10-CM | POA: Insufficient documentation

## 2009-05-29 LAB — CONVERTED CEMR LAB: Hgb A1c MFr Bld: 11 %

## 2009-05-31 LAB — CONVERTED CEMR LAB
ALT: 21 units/L (ref 0–35)
AST: 22 units/L (ref 0–37)
Alkaline Phosphatase: 98 units/L (ref 39–117)
Creatinine, Ser: 1.13 mg/dL (ref 0.40–1.20)
LDL Cholesterol: 77 mg/dL (ref 0–99)
Total Bilirubin: 0.4 mg/dL (ref 0.3–1.2)
Total CHOL/HDL Ratio: 2.4
VLDL: 19 mg/dL (ref 0–40)

## 2009-06-05 ENCOUNTER — Encounter: Payer: Self-pay | Admitting: Family Medicine

## 2009-09-05 ENCOUNTER — Telehealth: Payer: Self-pay | Admitting: Family Medicine

## 2009-11-15 ENCOUNTER — Ambulatory Visit (HOSPITAL_COMMUNITY): Admission: RE | Admit: 2009-11-15 | Discharge: 2009-11-15 | Payer: Self-pay | Admitting: Family Medicine

## 2009-11-15 ENCOUNTER — Encounter: Payer: Self-pay | Admitting: Family Medicine

## 2009-11-15 ENCOUNTER — Ambulatory Visit: Payer: Self-pay | Admitting: Family Medicine

## 2009-11-15 LAB — CONVERTED CEMR LAB
Hgb A1c MFr Bld: 9.8 %
Microalbumin U total vol: 10 mg/L

## 2009-11-19 ENCOUNTER — Encounter: Admission: RE | Admit: 2009-11-19 | Discharge: 2009-11-19 | Payer: Self-pay | Admitting: Family Medicine

## 2009-11-19 LAB — CONVERTED CEMR LAB
Albumin: 4.5 g/dL (ref 3.5–5.2)
Alkaline Phosphatase: 84 units/L (ref 39–117)
BUN: 29 mg/dL — ABNORMAL HIGH (ref 6–23)
Creatinine, Ser: 1.06 mg/dL (ref 0.40–1.20)
Glucose, Bld: 101 mg/dL — ABNORMAL HIGH (ref 70–99)
Potassium: 3.9 meq/L (ref 3.5–5.3)
Total Bilirubin: 0.4 mg/dL (ref 0.3–1.2)

## 2009-11-20 ENCOUNTER — Encounter: Payer: Self-pay | Admitting: Family Medicine

## 2009-11-26 ENCOUNTER — Encounter: Payer: Self-pay | Admitting: Cardiology

## 2009-11-28 ENCOUNTER — Ambulatory Visit: Payer: Self-pay | Admitting: Cardiology

## 2010-04-04 ENCOUNTER — Ambulatory Visit: Admission: RE | Admit: 2010-04-04 | Discharge: 2010-04-04 | Payer: Self-pay | Admitting: Gynecology

## 2010-04-04 ENCOUNTER — Other Ambulatory Visit: Admission: RE | Admit: 2010-04-04 | Discharge: 2010-04-04 | Payer: Self-pay | Admitting: Gynecology

## 2010-04-04 ENCOUNTER — Encounter: Payer: Self-pay | Admitting: Family Medicine

## 2010-05-26 ENCOUNTER — Encounter: Payer: Self-pay | Admitting: Family Medicine

## 2010-05-26 ENCOUNTER — Ambulatory Visit: Payer: Self-pay | Admitting: Family Medicine

## 2010-05-26 LAB — CONVERTED CEMR LAB: Hgb A1c MFr Bld: 9.2 %

## 2010-05-27 ENCOUNTER — Encounter: Payer: Self-pay | Admitting: Family Medicine

## 2010-05-27 LAB — CONVERTED CEMR LAB
ALT: 25 units/L (ref 0–35)
CO2: 24 meq/L (ref 19–32)
Direct LDL: 83 mg/dL
Sodium: 142 meq/L (ref 135–145)
Total Bilirubin: 0.4 mg/dL (ref 0.3–1.2)
Total Protein: 7.1 g/dL (ref 6.0–8.3)

## 2010-06-26 ENCOUNTER — Encounter: Payer: Self-pay | Admitting: Family Medicine

## 2010-07-09 ENCOUNTER — Encounter (INDEPENDENT_AMBULATORY_CARE_PROVIDER_SITE_OTHER): Payer: Self-pay | Admitting: Pharmacist

## 2010-07-15 ENCOUNTER — Encounter: Payer: Self-pay | Admitting: Family Medicine

## 2010-08-06 ENCOUNTER — Emergency Department (HOSPITAL_COMMUNITY)
Admission: EM | Admit: 2010-08-06 | Discharge: 2010-08-06 | Payer: Self-pay | Source: Home / Self Care | Admitting: Family Medicine

## 2010-09-05 ENCOUNTER — Encounter: Payer: Self-pay | Admitting: Family Medicine

## 2010-09-05 LAB — CONVERTED CEMR LAB
ALT: 24 units/L
AST: 25 units/L
Creatinine, Ser: 1 mg/dL
Lymphocytes Relative: 34 %
MCV: 92.6 fL
Neutrophils Relative %: 55 %
RDW: 13.7 %
Total Bilirubin: 0.3 mg/dL

## 2010-09-18 ENCOUNTER — Telehealth: Payer: Self-pay | Admitting: Family Medicine

## 2010-09-19 ENCOUNTER — Encounter
Admission: RE | Admit: 2010-09-19 | Discharge: 2010-09-19 | Payer: Self-pay | Source: Home / Self Care | Attending: Obstetrics & Gynecology | Admitting: Obstetrics & Gynecology

## 2010-09-25 ENCOUNTER — Ambulatory Visit: Admission: RE | Admit: 2010-09-25 | Discharge: 2010-09-25 | Payer: Self-pay | Source: Home / Self Care

## 2010-09-25 LAB — CONVERTED CEMR LAB: Hgb A1c MFr Bld: 9.4 %

## 2010-10-07 NOTE — Consult Note (Signed)
Summary: Eye Exam  Eye Exam   Imported By: De Nurse 07/17/2010 11:12:51  _____________________________________________________________________  External Attachment:    Type:   Image     Comment:   External Document

## 2010-10-07 NOTE — Letter (Signed)
Summary: Results Follow-up Letter  [facility]  [address]   [place], [place] [address]   Phone: [phone]  Fax: [phone]    05/27/2010  [address] [place], [place]  [address]  Dear Ms. Hahne,   05/27/2010  [address] [place], [place]  [address]  Dear Ms. Polidore,   The following are the results of your recent test(s):  Kidney function tests: Normal  Liver Function tests: Normal  LDL (bad cholesterol): Slightly high at 83 (goal is less than 70) - please work on increasing exercise and adding more vegetables to your diet and removing fried foods         Sincerely,  Bobby Rumpf  MD [facility]           Appended Document: Results Follow-up Letter mailed.

## 2010-10-07 NOTE — Miscellaneous (Signed)
Summary: CKD QI   Clinical Lists Changes  Problems: Removed problem of RENAL INSUFFICIENCY, ACUTE (ICD-585.9)

## 2010-10-07 NOTE — Assessment & Plan Note (Signed)
Summary: f1y/jss  Medications Added MULTIVITAMINS   TABS (MULTIPLE VITAMIN) 1 by mouth daily VITAMIN B-12 2500 MCG SUBL (CYANOCOBALAMIN) 1 by mouth daily ASPIRIN 81 MG  TABS (ASPIRIN) 1 by mouth daily      Allergies Added: NKDA  Visit Type:  Follow-up Primary Provider:  Bobby Rumpf  MD  CC:  Cardiomyopathy.  History of Present Illness: The patient presents for followup of the above. She has a dilated cardiomyopathy with an ejection fraction in the past of 45%. In October 2009 after I last saw her I repeated the echo and her EF was 55%. She returns for routine followup. Since that time she has had no new cardiovascular complaints. She does get short of breath climbing a flight of stairs but this is not new. She does not report any resting shortness of breath and has no PND or orthopnea. She does not describe palpitations, presyncope or syncope. She does not exercise though she works a full-time job. She does not describe chest pressure, neck or arm discomfort. She has had no weight gain or edema.  Current Medications (verified): 1)  Diclofenac Sodium 75 Mg Tbec (Diclofenac Sodium) .... Take 1 Tablet By Mouth Twice A Day 2)  Zetia 10 Mg Tabs (Ezetimibe) .[name] Kitchen.. 1 By Mouth Daily 3)  Simvastatin 80 Mg Tabs (Simvastatin) .[name] Kitchen.. 1 By Mouth At Bedtime 4)  Lisinopril 40 Mg Tabs (Lisinopril) .[name] Kitchen.. 1 By Mouth Once Daily 5)  Carvedilol 25 Mg  Tabs (Carvedilol) .[name] Kitchen.. 1 Pill By Mouth 2 Times Daily 6)  Amlodipine Besylate 10 Mg  Tabs (Amlodipine Besylate) .... Once Daily 7)  Glipizide Xl 10 Mg Tb24 (Glipizide) .... 2 Tablet By Mouth Daily 8)  Metformin Hcl 1000 Mg Tabs (Metformin Hcl) .... Take 1 Tablet By Mouth Two Times A Day 9)  Hydrochlorothiazide 25 Mg  Tabs (Hydrochlorothiazide) .... Take 1 Tab By Mouth Every Morning 10)  One Touch Test Strp (Glucose Blood) .... Use As Directed 11)  One Touch Glucose Meter .... Use As Directed. 12)  Prodigy Blood Glucose Test  Strp (Glucose Blood) .... Check Blood  Glucose As Directed. 13)  Multivitamins   Tabs (Multiple Vitamin) .[name] Kitchen.. 1 By Mouth Daily 14)  Vitamin B-12 2500 Mcg Subl (Cyanocobalamin) .[name] Kitchen.. 1 By Mouth Daily 15)  Aspirin 81 Mg  Tabs (Aspirin) .[name] Kitchen.. 1 By Mouth Daily  Allergies (verified): No Known Drug Allergies  Past History:  Past Medical History: Mildly reduced ejection fraction felt to be nonischemic (EF 40-45 initially, repeat echo 55% 10/09, negative nuclear study), diabetes mellitus, hypertension, degenerative joint disease, goiter  Past Surgical History: Riight knee replacement in February 2006, left knee replacement in February 2007, abdominal hysterectomy with bilateral salpingo-oophorectomy, breast surgery with breast cancer (she has completed 34 radiation treatments), and carpal tunnel surgery.   Review of Systems       As stated in the HPI and negative for all other systems.   Vital Signs:  Patient profile:   60 year old female Height:      67.5 inches Weight:      221 pounds BMI:     34.23 Pulse rate:   88 / minute Resp:     16 per minute BP sitting:   112 / 82  (right arm)  Vitals Entered By: Marrion Coy, CNA (November 28, 2009 10:22 AM)  Physical Exam  General:  Well developed, well nourished, in no acute distress. Head:  normocephalic and atraumatic Eyes:  PERRLA/EOM intact; conjunctiva and lids normal. Mouth:  Teeth,  gums and palate normal. Oral mucosa normal. Neck:  Neck supple, no JVD. No masses, or abnormal cervical nodes.  Small goiter Chest Wall:  no deformities or breast masses noted Lungs:  Clear bilaterally to auscultation and percussion. Abdomen:  Bowel sounds positive; abdomen soft and non-tender without masses, organomegaly, or hernias noted. No hepatosplenomegaly. Msk:  Back normal, normal gait. Muscle strength and tone normal. Extremities:  No clubbing or cyanosis. Neurologic:  Alert and oriented x 3. Skin:  Intact without lesions or rashes. Cervical Nodes:  no significant  adenopathy Axillary Nodes:  no significant adenopathy Inguinal Nodes:  no significant adenopathy Psych:  Normal affect.   Detailed Cardiovascular Exam  Neck    Carotids: Carotids full and equal bilaterally without bruits.      Neck Veins: Normal, no JVD.    Heart    Inspection: no deformities or lifts noted.      Palpation: normal PMI with no thrills palpable.      Auscultation: regular rate and rhythm, S1, S2 without murmurs, rubs, gallops, or clicks.    Vascular    Abdominal Aorta: no palpable masses, pulsations, or audible bruits.      Femoral Pulses: normal femoral pulses bilaterally.      Pedal Pulses: normal pedal pulses bilaterally.      Radial Pulses: normal radial pulses bilaterally.      Peripheral Circulation: no clubbing, cyanosis, or edema noted with normal capillary refill.     Impression & Recommendations:  Problem # 1:  CARDIOMYOPATHY, IDIOPATHIC (ICD-425.4) At this point I would have no reason to believe that her ejection fraction is lower than 55%. I think she responded well to the medications. No further cardiovascular testing is suggested.  Problem # 2:  HYPERTENSION, BENIGN SYSTEMIC (ICD-401.1) Her blood pressure is controlled on the medicines as listed. She will remain on the meds as listed.  Problem # 3:  PREVENTIVE HEALTH CARE (ICD-V70.0) She he is overweight. She doesn't exercise. We discussed the need to exercise and diet for healthy lifestyle.  Problem # 4:  HYPERCHOLESTEROLEMIA (ICD-272.0)  Her last LDL was 76. I reviewed this. She hasn't had a recent HDL but in the past he was in the 51s. She will remain on the meds as listed.  Problem # 5:  DIABETES MELLITUS II, UNCOMPLICATED (ICD-250.00) I did not see a recent hemoglobin A1c and suggested that she followup with her primary doctor for this.  Prevention & Chronic Care Immunizations   Influenza vaccine: Not documented    Tetanus booster: 09/08/2003: Done.   Tetanus booster due:  09/07/2013    Pneumococcal vaccine: Done.  (06/08/1999)   Pneumococcal vaccine due: None    H. zoster vaccine: Not documented  Colorectal Screening   Hemoccult: Done.  (08/07/2005)   Hemoccult due: Not Indicated    Colonoscopy: Done.  (09/07/2005)   Colonoscopy due: 09/08/2015  Other Screening   Pap smear: normal  (12/13/2007)   Pap smear due: 12/13/2010    Mammogram: benign findings  (11/19/2009)   Mammogram due: 11/20/2010    DXA bone density scan: Not documented   Smoking status: quit  (11/15/2009)  Diabetes Mellitus   HgbA1C: 9.8  (11/15/2009)   HgbA1C action/deferral: Ordered  (11/15/2009)   Hemoglobin A1C due: 08/28/2009    Eye exam: normal  (12/05/2008)   Diabetic eye exam action/deferral: Ophthalmology referral  (11/15/2009)   Eye exam due: 12/2009    Foot exam: yes  (11/15/2009)   Foot exam action/deferral: Do today   High  risk foot: Not documented   Foot care education: Not documented   Foot exam due: 02/15/2010    Urine microalbumin/creatinine ratio: Not documented   Urine microalbumin action/deferral: Ordered   Urine microalbumin/cr due: 11/16/2010  Lipids   Total Cholesterol: 165  (05/29/2009)   Lipid panel action/deferral: Lipid Panel ordered   LDL: 77  (05/29/2009)   LDL Direct: 76  (11/15/2009)   HDL: 69  (05/29/2009)   Triglycerides: 96  (05/29/2009)   Lipid panel due: 11/26/2009    SGOT (AST): 21  (11/15/2009)   BMP action: Ordered   SGPT (ALT): 19  (11/15/2009)   Alkaline phosphatase: 84  (11/15/2009)   Total bilirubin: 0.4  (11/15/2009)   Liver panel due: 11/26/2009  Hypertension   Last Blood Pressure: 112 / 82  (11/28/2009)   Serum creatinine: 1.06  (11/15/2009)   BMP action: Ordered   Serum potassium 3.9  (11/15/2009)   Basic metabolic panel due: 11/26/2009  Self-Management Support :   Personal Goals (by the next clinic visit) :     Personal A1C goal: 9  (05/29/2009)     Personal blood pressure goal: 130/80  (05/29/2009)      Personal LDL goal: 70  (05/29/2009)    Diabetes self-management support: Copy of home glucose meter record, Written self-care plan, Education handout  (11/15/2009)    Hypertension self-management support: Written self-care plan, Education handout  (11/15/2009)    Lipid self-management support: Education handout  (11/15/2009)

## 2010-10-07 NOTE — Assessment & Plan Note (Signed)
Summary: f/u,df   Vital Signs:  Patient profile:   60 year old female Weight:      219.7 pounds BMI:     34.02 Temp:     96.9 degrees F Pulse rate:   80 / minute BP sitting:   165 / 97  (right arm)  Vitals Entered By: Starleen Blue RN (November 15, 2009 8:39 AM) CC: f/u DM2 Is Patient Diabetic? Yes Pain Assessment Patient in pain? no        Primary Care Provider:  Bobby Rumpf  MD  CC:  f/u DM2.  History of Present Illness: 1) DM2: Reports continued poor eating habits - skipped meals, poor meal choices due to work schedule. A1C today 11.0 in September 2010.  CBGs fasting 126 to 237. Eats two meals per day most days. Denies polyuria, polydypsia, vision change. States that she is taking all of her medications as directed. Checks feet occasionally. Last saw ophthalmologist in late March 2010 with good report.   2) HTN: BPs 120-130's / 60's to 80's when she checks at home. 165/97 today (did not take medications).  Denies headache, LE edema. Reports a single episode of chest pain different from her usual chest wall pain as below.   3) Chest pain: Reports single episode of chest wall pain about 2 weeks ago while she was sleeping. Felt "like heaviness on her chest" bilaterally, also felt short of breath. Resolved on its own after 2 minutes. No exertional chest pain history. History of idiopathic cardiomyopathy, followed by Dr. Antoine Poche at [facility] (plan for followup this month).       Habits & Providers  Alcohol-Tobacco-Diet     Tobacco Status: quit  Current Medications (verified): 1)  Diclofenac Sodium 75 Mg Tbec (Diclofenac Sodium) .... Take 1 Tablet By Mouth Twice A Day 2)  Zetia 10 Mg Tabs (Ezetimibe) .[name] Kitchen.. 1 By Mouth Daily 3)  Simvastatin 80 Mg Tabs (Simvastatin) .[name] Kitchen.. 1 By Mouth At Bedtime 4)  Lisinopril 40 Mg Tabs (Lisinopril) .[name] Kitchen.. 1 By Mouth Once Daily 5)  Carvedilol 25 Mg  Tabs (Carvedilol) .[name] Kitchen.. 1 Pill By Mouth 2 Times Daily 6)  Amlodipine Besylate 10 Mg  Tabs  (Amlodipine Besylate) .... Once Daily 7)  Glipizide Xl 10 Mg Tb24 (Glipizide) .... 2 Tablet By Mouth Daily 8)  Metformin Hcl 1000 Mg Tabs (Metformin Hcl) .... Take 1 Tablet By Mouth Two Times A Day 9)  Hydrochlorothiazide 25 Mg  Tabs (Hydrochlorothiazide) .... Take 1 Tab By Mouth Every Morning 10)  One Touch Test Strp (Glucose Blood) .... Use As Directed 11)  One Touch Glucose Meter .... Use As Directed. 12)  Prodigy Blood Glucose Test  Strp (Glucose Blood) .... Check Blood Glucose As Directed.  Allergies (verified): No Known Drug Allergies  Social History: Smoking Status:  quit  Physical Exam  General:  obese, NAD vitals reveiwed  Eyes:  PERRL, EOMI, vision grossly intact, no retinopathy noted on funduscopy  Neck:  no JVD or carotid bruits  Chest Wall:  non tender to palpation  Lungs:  Normal respiratory effort, chest expands symmetrically. Lungs are clear to auscultation, no crackles or wheezes.  No pain with inspiration  Heart:  Normal rate and regular rhythm. S1 and S2 normal without gallop, murmur, click, rub or other extra sounds. Pulses:  2 + pedal pulses Extremities:  no edema Neurologic:  alert & oriented X3. cranial nerves II-XII intact and strength normal in all extremities.    Diabetes Management Exam:    Foot Exam (  with socks and/or shoes not present):       Sensory-Pinprick/Light touch:          Left medial foot (L-4): normal          Left dorsal foot (L-5): normal          Left lateral foot (S-1): normal          Right medial foot (L-4): normal          Right dorsal foot (L-5): normal          Right lateral foot (S-1): normal       Sensory-Monofilament:          Left foot: normal          Right foot: normal       Inspection:          Left foot: normal          Right foot: normal       Nails:          Left foot: normal          Right foot: normal    Foot Exam by Podiatrist:       Results: no diabetic findings    Eye Exam:       Eye Exam done elsewhere           Date: 12/05/2008          Results: normal          Done by: outside ophthalmologist    Impression & Recommendations:  Problem # 1:  CHEST PAIN, ATYPICAL (ICD-786.59) Assessment Unchanged  Will check EKG for baseline. Different presentation from usual cehst wall pain symptoms, but single atypical episode as per HPI. Will continue to follow, continue risk reduction of CAD with control of HTN, DM, lipids. Advised to also follow up with cardiologist.   Orders: [facility]- Est  Level 4 (81191)  Problem # 2:  DIABETES MELLITUS II, UNCOMPLICATED (ICD-250.00)  Will increase glipizide. Handout provided regarding dietary recommendations for patients (from [email or web address]) Check labs as below.  Her updated medication list for this problem includes:    Lisinopril 40 Mg Tabs (Lisinopril) .[name] Kitchen... 1 by mouth once daily    Glipizide Xl 10 Mg Tb24 (Glipizide) .[name] Kitchen... 2 tablet by mouth daily    Metformin Hcl 1000 Mg Tabs (Metformin hcl) .[name] Kitchen... Take 1 tablet by mouth two times a day  Orders: UA Microalbumin-[facility] (47829) Comp Met-[facility] [phone]) Direct LDL-[facility] ([id number]) A1C-[facility] (41324) [facility]- Est  Level 4 (40102)  Labs Reviewed: Creat: 1.13 (05/29/2009)   Microalbumin: 10 (11/15/2009)  Last Eye Exam: normal (12/05/2008) Reviewed HgBA1c results: 9.8 (11/15/2009)  11.0 (05/29/2009)  Problem # 3:  HYPERTENSION, BENIGN SYSTEMIC (ICD-401.1) Assessment: Deteriorated  Not at goal, likely secondary to not having taken medications in 24+ hours. Advised regarding DASH diet. Handout provided.  Her updated medication list for this problem includes:    Lisinopril 40 Mg Tabs (Lisinopril) .[name] Kitchen... 1 by mouth once daily    Carvedilol 25 Mg Tabs (Carvedilol) .[name] Kitchen... 1 pill by mouth 2 times daily    Amlodipine Besylate 10 Mg Tabs (Amlodipine besylate) ..... Once daily    Hydrochlorothiazide 25 Mg Tabs (Hydrochlorothiazide) .[name] Kitchen... Take 1 tab by mouth every morning  BP today: 165/97 Prior BP: 131/83  (05/29/2009)  Prior 10 Yr Risk Heart Disease: 8 % (09/16/2007)  Labs Reviewed: K+: 3.5 (05/29/2009) Creat: : 1.13 (05/29/2009)   Chol: 165 (05/29/2009)   HDL: 69 (05/29/2009)   LDL: 77 (05/29/2009)  TG: 96 (05/29/2009)  Orders: [facility]- Est  Level 4 ([address]214)  Problem # 4:  HYPERCHOLESTEROLEMIA (ICD-272.0) Assessment: Unchanged  Check CMEt, LDL direct. Dietyary and exercise counselling for 15 minutes.  Her updated medication list for this problem includes:    Zetia 10 Mg Tabs (Ezetimibe) .[name] Kitchen... 1 by mouth daily    Simvastatin 80 Mg Tabs (Simvastatin) .[name] Kitchen... 1 by mouth at bedtime  Labs Reviewed: SGOT: 22 (05/29/2009)   SGPT: 21 (05/29/2009)  Lipid Goals: Chol Goal: 200 (09/16/2007)   HDL Goal: 40 (09/16/2007)   LDL Goal: 100 (09/16/2007)   TG Goal: 150 (09/16/2007)  Prior 10 Yr Risk Heart Disease: 8 % (09/16/2007)   HDL:69 (05/29/2009), 72 (10/22/2008)  LDL:77 (05/29/2009), 76 (10/22/2008)  Chol:165 (05/29/2009), 167 (10/22/2008)  Trig:96 (05/29/2009), 96 (10/22/2008)  Orders: [facility]- Est  Level 4 ([address]214)  Complete Medication List: 1)  Diclofenac Sodium 75 Mg Tbec (Diclofenac sodium) .... Take 1 tablet by mouth twice a day 2)  Zetia 10 Mg Tabs (Ezetimibe) .[name] Kitchen.. 1 by mouth daily 3)  Simvastatin 80 Mg Tabs (Simvastatin) .[name] Kitchen.. 1 by mouth at bedtime 4)  Lisinopril 40 Mg Tabs (Lisinopril) .[name] Kitchen.. 1 by mouth once daily 5)  Carvedilol 25 Mg Tabs (Carvedilol) .[name] Kitchen.. 1 pill by mouth 2 times daily 6)  Amlodipine Besylate 10 Mg Tabs (Amlodipine besylate) .... Once daily 7)  Glipizide Xl 10 Mg Tb24 (Glipizide) .... 2 tablet by mouth daily 8)  Metformin Hcl 1000 Mg Tabs (Metformin hcl) .... Take 1 tablet by mouth two times a day 9)  Hydrochlorothiazide 25 Mg Tabs (Hydrochlorothiazide) .... Take 1 tab by mouth every morning 10)  One Touch Test Strp (Glucose blood) .... Use as directed 11)  One Touch Glucose Meter  .... Use as directed. 12)  Prodigy Blood Glucose Test Strp (Glucose blood) .... Check blood  glucose as directed.  Other Orders: 12 Lead EKG (12 Lead EKG)  Patient Instructions: 1)  It was great to see you today!  2)  Follow your blood sugar every day. Check fasting blood sugars once a day (6-8 hours after your last meal)  3)  Try to eat 3 meals a day. This will help keep your blood sugars better regulated.  4)  Read the handout on diabetes and diet and follow recommendations.  5)  Check blood pressure three times a week at the same time each time.  6)  Record numbers for blood pressure and for blood sugar and bring them in (or bring in the meter). 7)  Take TWO pills of your Glipizide each day.  8)  Take all other medications as before. 9)  Continue exercising. Try to add 45 minutes to 1 hour of walking to your routine on the days you do not do weights and belts 10)  Follow up in three months.  11)  I will call you to discuss the results of your labs.  Prescriptions: GLIPIZIDE XL 10 MG TB24 (GLIPIZIDE) 2 tablet by mouth daily  #60 x 3   Entered and Authorized by:   Bobby Rumpf  MD   Signed by:   Bobby Rumpf  MD on 11/15/2009   Method used:   Electronically to        [facility]  [address] [phone]* (retail)       [address]       [place], [place]  [id number]       Ph: [phone] or [phone]  Fax: [phone]   RxID:   [id number]    Prevention & Chronic Care Immunizations   Influenza vaccine: Not documented    Tetanus booster: 09/08/2003: Done.   Tetanus booster due: 09/07/2013    Pneumococcal vaccine: Done.  (06/08/1999)   Pneumococcal vaccine due: None    H. zoster vaccine: Not documented  Colorectal Screening   Hemoccult: Done.  (08/07/2005)   Hemoccult due: Not Indicated    Colonoscopy: Done.  (09/07/2005)   Colonoscopy due: 09/08/2015  Other Screening   Pap smear: normal  (12/13/2007)   Pap smear due: 12/13/2010    Mammogram: Done.  (09/07/2006)   Mammogram due: 09/08/2007    DXA bone density scan: Not  documented   Smoking status: quit  (11/15/2009)  Diabetes Mellitus   HgbA1C: 9.8  (11/15/2009)   HgbA1C action/deferral: Ordered  (11/15/2009)   Hemoglobin A1C due: 08/28/2009    Eye exam: normal  (12/05/2008)   Diabetic eye exam action/deferral: Ophthalmology referral  (11/15/2009)   Eye exam due: 12/2009    Foot exam: yes  (11/15/2009)   Foot exam action/deferral: Do today   High risk foot: Not documented   Foot care education: Not documented   Foot exam due: 02/15/2010    Urine microalbumin/creatinine ratio: Not documented   Urine microalbumin action/deferral: Ordered   Urine microalbumin/cr due: 11/16/2010    Diabetes flowsheet reviewed?: Yes   Progress toward A1C goal: Improved  Lipids   Total Cholesterol: 165  (05/29/2009)   Lipid panel action/deferral: Lipid Panel ordered   LDL: 77  (05/29/2009)   LDL Direct: Not documented   HDL: 69  (05/29/2009)   Triglycerides: 96  (05/29/2009)   Lipid panel due: 11/26/2009    SGOT (AST): 22  (05/29/2009)   BMP action: Ordered   SGPT (ALT): 21  (05/29/2009) CMP ordered    Alkaline phosphatase: 98  (05/29/2009)   Total bilirubin: 0.4  (05/29/2009)   Liver panel due: 11/26/2009    Lipid flowsheet reviewed?: Yes   Progress toward LDL goal: Unchanged  Hypertension   Last Blood Pressure: 165 / 97  (11/15/2009)   Serum creatinine: 1.13  (05/29/2009)   BMP action: Ordered   Serum potassium 3.5  (05/29/2009) CMP ordered    Basic metabolic panel due: 11/26/2009    Hypertension flowsheet reviewed?: Yes   Progress toward BP goal: Deteriorated  Self-Management Support :   Personal Goals (by the next clinic visit) :     Personal A1C goal: 9  (05/29/2009)     Personal blood pressure goal: 130/80  (05/29/2009)     Personal LDL goal: 70  (05/29/2009)    Patient will work on the following items until the next clinic visit to reach self-care goals:     Medications and monitoring: take my medicines every day, check my blood  sugar, check my blood pressure, bring all of my medications to every visit, weigh myself weekly, examine my feet every day  (11/15/2009)     Eating: drink diet soda or water instead of juice or soda, eat more vegetables, use fresh or frozen vegetables, eat foods that are low in salt, eat baked foods instead of fried foods, eat fruit for snacks and desserts, limit or avoid alcohol  (11/15/2009)     Activity: take a 30 minute walk every day  (11/15/2009)    Diabetes self-management support: Copy of home glucose meter record, Written self-care plan, Education handout  (11/15/2009)   Diabetes care plan printed   Diabetes education handout printed  Hypertension self-management support: Written self-care plan, Education handout  (11/15/2009)   Hypertension self-care plan printed.   Hypertension education handout printed    Lipid self-management support: Education handout  (11/15/2009)     Lipid education handout printed  Laboratory Results   Urine Tests  Date/Time Received: November 15, 2009 9:45 AM  Date/Time Reported: November 15, 2009 1:43 PM   Microalbumin (urine): 10 mg/L Creatinine: 50mg /dL  A:C Ratio 16-109 Abnormal Comments: ...............test performed by......[name] KitchenBonnie A. [place], MLS (ASCP)cm   Blood Tests   Date/Time Received: November 15, 2009 9:45 AM  Date/Time Reported: November 15, 2009 1:41 PM   HGBA1C: 9.8%   (Normal Range: Non-Diabetic - 3-6%   Control Diabetic - 6-8%)  Comments: ...............test performed by......[name] KitchenBonnie A. [place], MLS (ASCP)cm     Last Flex Sig:  Done. (07/08/2000 12:00:00 AM) Flex Sig Next Due:  Not Indicated Last Hemoccult Result: Done. (08/07/2005 12:00:00 AM) Hemoccult Next Due:  Not Indicated Last PAP:  normal (12/13/2007 9:23:40 AM) PAP Next Due:  3 yr

## 2010-10-07 NOTE — Miscellaneous (Signed)
Summary: Orders Update   Clinical Lists Changes  Orders: Added new Test order of B12-[facility] [phone]) - Signed Added new Test order of CBC-[facility] ([id number]) - Signed  OK per Dr. Wallene Huh

## 2010-10-07 NOTE — Miscellaneous (Signed)
  Clinical Lists Changes  Observations: Added new observation of ECHOINTERP: -  Left ventricular size was at the upper limits of normal. Overall       left ventricular systolic function was normal. Left       ventricular ejection fraction was estimated to be 55 %. There       was no diagnostic evidence of left ventricular regional wall       motion abnormalities. Features were consistent with mild       diastolic dysfunction. -  The aortic valve was mildly calcified. -  There was mild thickening of the mitral valve. There was mild       mitral valvular regurgitation. -  The left atrium was moderately dilated. -  The estimated peak pulmonary artery systolic pressure was mildly       increased. (07/06/2008 15:30)      Echocardiogram  Procedure date:  07/06/2008  Findings:      -  Left ventricular size was at the upper limits of normal. Overall       left ventricular systolic function was normal. Left       ventricular ejection fraction was estimated to be 55 %. There       was no diagnostic evidence of left ventricular regional wall       motion abnormalities. Features were consistent with mild       diastolic dysfunction. -  The aortic valve was mildly calcified. -  There was mild thickening of the mitral valve. There was mild       mitral valvular regurgitation. -  The left atrium was moderately dilated. -  The estimated peak pulmonary artery systolic pressure was mildly       increased.
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
## 2010-10-09 NOTE — Assessment & Plan Note (Signed)
Summary: labs & refill/[facility]/Elmire Amrein   Vital Signs:  Patient profile:   60 year old female Height:      67.5 inches Weight:      219.1 pounds Temp:     97.8 degrees F Pulse rate:   72 / minute BP sitting:   150 / 90  (left arm)  Vitals Entered By: Theresia Lo RN (September 25, 2010 8:38 AM) CC: diabetic follow up Is Patient Diabetic? Yes Pain Assessment Patient in pain? yes     Location: left knee Intensity: 10 Type: aching   Primary Care Provider:  Bobby Rumpf  MD  CC:  diabetic follow up.  History of Present Illness: Last seen Sept 2011 -   1) DM2: Reports continued poor eating habits - skipped meals, poor meal choices due to work schedule (works nights). A1C today 11.0 in September 2010, 9.8 in March 2011, 9.2 in September, 9.4 today.  Not checking sugars because meter is broken. Still eats two meals per day many days, but has been trying to get three meals in since our last meeting. States that she is taking all of her medications as directed. Checks feet occasionally - notes some calluses on her 5th toes.   2) HTN: BP was 109/70 at home w/ arm monitor. 148/87 at last check, 150/90 today (did not take medications since 5AM yesterday morning).  Denies headache, LE edema, chest pain, dyspnea. Unable to exercise due to lack of time.   3) HLD: Last LDL direct 83. No exercise. Trying to eat better with more vegetables but still having difficulty due to work schedule   ROS: Denies chest pain, nausea, emesis, diarrhea, abdominal pain, vaginal bleeding, constipation, focal neurological signs   Habits & Providers  Alcohol-Tobacco-Diet     Tobacco Status: quit  Current Medications (verified): 1)  Zetia 10 Mg Tabs (Ezetimibe) .[name] Kitchen.. 1 By Mouth Daily 2)  Simvastatin 80 Mg Tabs (Simvastatin) .[name] Kitchen.. 1 By Mouth At Bedtime 3)  Lisinopril 40 Mg Tabs (Lisinopril) .[name] Kitchen.. 1 By Mouth Once Daily 4)  Carvedilol 25 Mg  Tabs (Carvedilol) .[name] Kitchen.. 1 Pill By Mouth 2 Times Daily 5)  Amlodipine Besylate 10  Mg  Tabs (Amlodipine Besylate) .... Once Daily 6)  Glipizide Xl 10 Mg Tb24 (Glipizide) .... 2 Tablet By Mouth Daily 7)  Metformin Hcl 1000 Mg Tabs (Metformin Hcl) .... Take 1 Tablet By Mouth Two Times A Day 8)  Hydrochlorothiazide 25 Mg  Tabs (Hydrochlorothiazide) .... Take 1 Tab By Mouth Every Morning 9)  One Touch Test Strp (Glucose Blood) .... Use As Directed 10)  One Touch Glucose Meter .... Use As Directed. 11)  Prodigy Blood Glucose Test  Strp (Glucose Blood) .... Check Blood Glucose As Directed. 12)  Multivitamins   Tabs (Multiple Vitamin) .[name] Kitchen.. 1 By Mouth Daily 13)  Vitamin B-12 2500 Mcg Subl (Cyanocobalamin) .[name] Kitchen.. 1 By Mouth Daily 14)  Aspirin 81 Mg  Tabs (Aspirin) .[name] Kitchen.. 1 By Mouth Daily  Allergies (verified): No Known Drug Allergies  Physical Exam  General:  obese, NAD vitals reveiwed    Impression & Recommendations:  Problem # 1:  HYPERTENSION, BENIGN SYSTEMIC (ICD-401.1) Assessment Unchanged  Not at goal in office again. Reports that she is at goal at home. Suspect some degree of medication non-adherence, though she states that she takes her medications as prescribed. Will continue medications as below. Will have nutrition follow up for discussion of DASH diet, exercise tailored to patient's work scheduled (works third shift). Follow in three months.   Her  updated medication list for this problem includes:    Lisinopril 40 Mg Tabs (Lisinopril) .[name] Kitchen... 1 by mouth once daily    Carvedilol 25 Mg Tabs (Carvedilol) .[name] Kitchen... 1 pill by mouth 2 times daily    Amlodipine Besylate 10 Mg Tabs (Amlodipine besylate) ..... Once daily    Hydrochlorothiazide 25 Mg Tabs (Hydrochlorothiazide) .[name] Kitchen... Take 1 tab by mouth every morning  BP today: 148/87 Prior BP: 112/82 (11/28/2009)  Prior 10 Yr Risk Heart Disease: 8 % (09/16/2007)  Labs Reviewed: K+: 3.9 (11/15/2009) Creat: : 1.06 (11/15/2009)   Chol: 165 (05/29/2009)   HDL: 69 (05/29/2009)   LDL: 77 (05/29/2009)   TG: 96  (05/29/2009)  Orders: [facility]- Est  Level 4 (99214)  Problem # 2:  DIABETES MELLITUS II, UNCOMPLICATED (ICD-250.00) Assessment: Unchanged Not at goal - stable since last visit. On medications as below. Reports that she has been unable to make many attempts to change her diet in the ways that we have discussed secondary to her working third shift. She has also been unable to exercise for the same reason. I believe that she might benefit from a nutrition consult to review her dietary history and to make appropriate recommendations. Will also refer to Pharmacy Clinic to discuss starting insulin therapy - I would like to see her attempt dietary and exercise modifications first, though motivation appears to be a large issue.   Her updated medication list for this problem includes:    Lisinopril 40 Mg Tabs (Lisinopril) .[name] Kitchen... 1 by mouth once daily    Glipizide Xl 10 Mg Tb24 (Glipizide) .[name] Kitchen... 2 tablet by mouth daily    Metformin Hcl 1000 Mg Tabs (Metformin hcl) .[name] Kitchen... Take 1 tablet by mouth two times a day    Aspirin 81 Mg Tabs (Aspirin) .[name] Kitchen... 1 by mouth daily  Orders: A1C-[facility] (16109) [facility]- Est  Level 4 (60454)  Problem # 3:  HYPERCHOLESTEROLEMIA (ICD-272.0) Assessment: Unchanged  Switch from Simva 80 mg to Lipitor 40mg  given that patient is on Norvasc 10mg . Follow up in three months.  Her updated medication list for this problem includes:    Zetia 10 Mg Tabs (Ezetimibe) .[name] Kitchen... 1 by mouth daily    Lipitor 40 Mg Tabs (Atorvastatin calcium) ..... One tab by mouth qday (disp generic)  Orders: [facility]- Est  Level 4 (09811)  Complete Medication List: 1)  Zetia 10 Mg Tabs (Ezetimibe) .[name] Kitchen.. 1 by mouth daily 2)  Lipitor 40 Mg Tabs (Atorvastatin calcium) .... One tab by mouth qday (disp generic) 3)  Lisinopril 40 Mg Tabs (Lisinopril) .[name] Kitchen.. 1 by mouth once daily 4)  Carvedilol 25 Mg Tabs (Carvedilol) .[name] Kitchen.. 1 pill by mouth 2 times daily 5)  Amlodipine Besylate 10 Mg Tabs (Amlodipine besylate) .... Once daily 6)   Glipizide Xl 10 Mg Tb24 (Glipizide) .... 2 tablet by mouth daily 7)  Metformin Hcl 1000 Mg Tabs (Metformin hcl) .... Take 1 tablet by mouth two times a day 8)  Hydrochlorothiazide 25 Mg Tabs (Hydrochlorothiazide) .... Take 1 tab by mouth every morning 9)  One Touch Test Strp (Glucose blood) .... Use as directed 10)  One Touch Glucose Meter  .... Use as directed. 11)  Prodigy Blood Glucose Test Strp (Glucose blood) .... Check blood glucose as directed. 12)  Multivitamins Tabs (Multiple vitamin) .[name] Kitchen.. 1 by mouth daily 13)  Vitamin B-12 2500 Mcg Subl (Cyanocobalamin) .[name] Kitchen.. 1 by mouth daily 14)  Aspirin 81 Mg Tabs (Aspirin) .[name] Kitchen.. 1 by mouth daily 15)  Tramadol Hcl 50 Mg Tabs (Tramadol hcl) .[name] KitchenMarland KitchenMarland Kitchen  One tab by mouth q 6 hrs as needed for pain  Patient Instructions: 1)  Schedule an appointment with our nutritionist for 1 month from now. 2)  Follow up with pharmacy clinic in one month to discuss getting started on insulin for your diabetes  3)  Follow up with me in three months  4)  We need to work on ways to get you to exercise as we have discussed  5)  Have the different doctors you saw send me the information about any tests they ran.  Prescriptions: TRAMADOL HCL 50 MG TABS (TRAMADOL HCL) one tab by mouth q 6 hrs as needed for pain  #30 x 0   Entered and Authorized by:   Bobby Rumpf  MD   Signed by:   Bobby Rumpf  MD on 09/25/2010   Method used:   [facility] to        [facility]  [address] [phone]* (retail)       [address]       [place], [place]  [id number]       Ph: [phone] or [phone]       Fax: [phone]   RxID:   [id number] LIPITOR 40 MG TABS (ATORVASTATIN CALCIUM) one tab by mouth qday (disp generic)  #30 x 3   Entered and Authorized by:   Bobby Rumpf  MD   Signed by:   Bobby Rumpf  MD on 09/25/2010   Method used:   [facility]        [facility]  [address] [phone]* (retail)       [address]       [place], [place]  [id number]       Ph: [phone] or [phone]       Fax: [phone]   RxID:   [id number]    Orders Added: 1)  A1C-[facility] [83036] 2)  [facility]- Est  Level 4 [62831]     Vital Signs:  Patient profile:   60 year old female Height:      67.5 inches Weight:      219.1 pounds Temp:     97.8 degrees F Pulse rate:   72 / minute BP sitting:   150 / 90  (left arm)  Vitals Entered By: Theresia Lo RN (September 25, 2010 8:38 AM)   Laboratory Results   Blood Tests   Date/Time Received: September 25, 2010 8:34 AM  Date/Time Reported: September 25, 2010 8:54 AM   HGBA1C: 9.4%   (Normal Range: Non-Diabetic - 3-6%   Control Diabetic - 6-8%)  Comments: ...............test performed by......[name] KitchenBonnie A. Swaz[place], MLS (ASCP)cm

## 2010-10-09 NOTE — Progress Notes (Signed)
Summary: Rx   Phone Note Call from Patient Call back at Home Phone [phone]   Reason for Call: Talk to Nurse Summary of Call: needs to speak with RN re: an rx ( I do not see on her med list)  Initial call taken by: Knox Royalty,  September 18, 2010 2:50 PM  Follow-up for Phone Call        [facility] [phone]   wants diclofenac back. has 2 tabs left. [facility] [address] [facility] amount until [facility] fills it.  she is taking meds for a 10am procedure. will forward this to pcp to handle she is making an appt to see pcp Follow-up by: Golden Circle RN,  September 18, 2010 2:53 PM  Additional Follow-up for Phone Call Additional follow up Details #1::        Attempted to call patient. No answer. I am not sure what procedure she is having and as such will not refill diclofenac at this time (given that it might interfere with her procedure). Will see patient at next appointment - if patient calls back I will speak with her as well.  Additional Follow-up by: Bobby Rumpf  MD,  September 19, 2010 10:23 AM    Additional Follow-up for Phone Call Additional follow up Details #2::    LM for her to call back to make an appt. last OV 05/26/10 Follow-up by: Golden Circle RN,  September 22, 2010 12:17 PM

## 2010-10-09 NOTE — Miscellaneous (Signed)
Summary: Labs from [facility]   Clinical Lists Changes  Observations: Added new observation of TSH: 0.924 microintl units/mL (09/05/2010 9:17) Added new observation of LYMPHS %: 34 % (09/05/2010 9:17) Added new observation of PMN %: 55 % (09/05/2010 9:17) Added new observation of PLATELETK/UL: 294 K/uL (09/05/2010 9:17) Added new observation of RDW: 13.7 % (09/05/2010 9:17) Added new observation of MCV: 92.6 fL (09/05/2010 9:17) Added new observation of HCT: 37.5 % (09/05/2010 9:17) Added new observation of HGB: 12.5 g/dL (98/07/9146 8:29) Added new observation of WBC COUNT: 6.5 10*3/microliter (09/05/2010 9:17) Added new observation of CALCIUM: 9.9 mg/dL (56/21/3086 5:78) Added new observation of ALBUMIN: 4.7 g/dL (46/96/2952 8:41) Added new observation of PROTEIN, TOT: 7.9 g/dL (32/44/0102 7:25) Added new observation of SGPT (ALT): 24 units/L (09/05/2010 9:17) Added new observation of SGOT (AST): 25 units/L (09/05/2010 9:17) Added new observation of ALK PHOS: 103 units/L (09/05/2010 9:17) Added new observation of BILI TOTAL: 0.3 mg/dL (36/64/4034 7:42) Added new observation of CREATININE: 1.00 mg/dL (59/56/3875 6:43) Added new observation of BUN: 33 mg/dL (32/95/1884 1:66) Added new observation of BG RANDOM: 174 mg/dL (03/06/1600 0:93) Added new observation of CO2 PLSM/SER: 21 meq/L (09/05/2010 9:17) Added new observation of CL SERUM: 106 meq/L (09/05/2010 9:17) Added new observation of K SERUM: 3.9 meq/L (09/05/2010 9:17) Added new observation of NA: 139 meq/L (09/05/2010 9:17)

## 2010-10-23 ENCOUNTER — Ambulatory Visit: Payer: Self-pay | Admitting: Family Medicine

## 2010-11-05 ENCOUNTER — Other Ambulatory Visit: Payer: Self-pay | Admitting: Radiation Oncology

## 2010-11-05 DIAGNOSIS — Z9889 Other specified postprocedural states: Secondary | ICD-10-CM

## 2010-11-19 LAB — URINE CULTURE: Culture  Setup Time: 201111301622

## 2010-11-19 LAB — POCT URINALYSIS DIPSTICK
Ketones, ur: NEGATIVE mg/dL
Nitrite: NEGATIVE
Protein, ur: NEGATIVE mg/dL
pH: 5 (ref 5.0–8.0)

## 2010-11-19 LAB — GC/CHLAMYDIA PROBE AMP, GENITAL: Chlamydia, DNA Probe: NEGATIVE

## 2010-11-19 LAB — POCT PREGNANCY, URINE: Preg Test, Ur: NEGATIVE

## 2010-11-19 LAB — WET PREP, GENITAL: Clue Cells Wet Prep HPF POC: NONE SEEN

## 2010-11-21 ENCOUNTER — Ambulatory Visit
Admission: RE | Admit: 2010-11-21 | Discharge: 2010-11-21 | Disposition: A | Discharge status: Home / Self Care | Payer: Commercial Indemnity | Source: Ambulatory Visit | Attending: Radiation Oncology | Admitting: Radiation Oncology

## 2010-11-21 DIAGNOSIS — Z9889 Other specified postprocedural states: Secondary | ICD-10-CM

## 2010-11-28 ENCOUNTER — Ambulatory Visit: Payer: Managed Care, Other (non HMO) | Attending: Radiation Oncology | Admitting: Radiation Oncology

## 2011-01-20 NOTE — Op Note (Signed)
NAME:  [name], [name]            ACCOUNT NO.:  [id number]   MEDICAL RECORD NO.:  [id number]          PATIENT TYPE:  INP   LOCATION:  5012                         FACILITY:  [facility]   PHYSICIAN:  Feliberto Gottron. Turner Daniels, M.D.   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   PROCEDURE:  Revision right total knee arthroplasty with removal of a  loose #9 Osteonics Scorpio tibial component and revision to a #9  Osteonics TS component with a 10-mm augment block and a 15 x 155 stem  triflange.  Components were cemented.   SURGEON:  Feliberto Gottron.  Turner Daniels, M.D.   FIRST ASSISTANT:  Skip Mayer, P.A.-C.   ANESTHETIC:  General endotracheal.   ESTIMATED BLOOD LOSS:  Minimal.   FLUID REPLACEMENT:  1200 mL of crystalloid.   DRAINS PLACED:  Two medium hemostats and a Foley catheter.   URINE OUTPUT:  300 mL.   TOURNIQUET TIME:  1 hour 45 minutes.   INDICATIONS FOR PROCEDURE:  Very nice 60 year old woman who had total  knees placed by Dr. Thurston Hole a few years ago.  The one on the right has  become loose.  The tibial component has shifted into varus and has  positive windshield wiper sign, and there has been gross movement on the  x-rays over the last few months.  She has gone into a varus deformity  and has severe unremitting pain.  Her workup has included aspiration  which was negative for infection.  She has never had any infectious  signs or symptoms.  In any event to decrease pain and increase function,  she desires elective revision right total knee arthroplasty.  Plan A is  to revise the tibia only, assuming that the patella and femur are not  lose.  If they are, of course, we will revise the other components as  well.  Risks and benefits of surgery were  discussed at length, questions  answered.   DESCRIPTION OF PROCEDURE:  The patient was identified by armband and  taken to the operating room at [facility] after a right  femoral nerve block was administered in the block area.  Appropriate  anesthetic monitors were attached and general endotracheal anesthesia  induced with the patient in supine position.  Lateral posts and foot  positioner applied to the table and tourniquet applied high to the right  thigh.  Right lower extremity was then prepped and draped in usual  sterile fashion from the ankle to the midthigh, limb wrapped with an  Esmarch bandage and the knee flexed to 90 degrees and the tourniquet  inflated to 350 mmHg.  Utilizing the old anterior midline incision, we  cut through the skin and subcutaneous tissue down to the residual  Ethilon sutures where the medial parapatellar arthrotomy was and  reproduced a medial parapatellar arthrotomy, cutting through the  Ethibond sutures.  Clear joint fluid  was encountered and sent for Gram  stain culture and came back with occasional monos and no organisms.  There was fairly abundant reactive synovial tissue present secondary to  some poly wear, and this was resected with the electrocautery, removing  the prepatellar fat pad and then elevating the superficial medial  collateral ligament off of the medial flare of the tibial condyle,  leaving it intact distally and going all the way around to the  semimembranosus insertion.  There was fracture noted of the medial  tibial plateau where the component had subsided into varus, and it was,  in fact, grossly loose.  After removing fairly large amounts of scar  tissue, we were able hyperflexed the knee, externally rotate the tibia  and then placed a McHale  retractor behind the tibia, levering off the  anterior aspect of the femoral component and also a lateral Homan.  This  gave [place] excellent exposure of the proximal tibia.  We  removed the  polyethylene bearing with a quarter inch osteotome and then easily  removed the tibial baseplate with a simple extractor.  No slap hammer  was required.  We then set about removing fairly exuberant amounts of  fractured cement and weakened bone.  The cement plug was then trephined  using the [place] claw cement chisel, and then using the back scratch  chisel, we went ahead and removed the rest of the cement from the canal.  We then sequentially reamed up to a 15-mm cylindrical reamer in  preparation for the long 155 stem.  The trial stem was then placed in  the canal, and a proximal tibial cut was accomplished off of the stem  removing essentially no bone medially and almost a centimeter of bone  laterally.  This would require the 1-cm cement augment block.  We then  placed the trial tibial baseplate with the augment block on the proximal  tibia, centered it with pins and performed the Delta fin keel cuts and  noted that it would probably require 4-mm medial offset of the  baseplate.  We then assembled a trial with a 4-mm offset, offsetting the  tibial tray 4 mm medial to the stem with a 10-mm augment.  This was  inserted, and we then used a trial 12, 15 and 18 polys.  The 18 poly had  the best ligamentous stability, and the knee still came to full  extension.  At this point, the trials were removed, and we assembled the  real implant which was a #9 tibial baseplate, 4-mm offset, setting the  tray 4-mm medial to the stem and a 155 x 15 triflange titanium stem.  After the implant itself was assembled, the bony surfaces were water  picked clean, dried with suction and sponges.  A double batch of Palacos  cement with 1500 mg Zinacef was mixed and applied to all bony and  metallic mating surfaces except for the shaft of the stem itself.  The  implant was then hammered into place,  excess cement removed and the  cement was allowed to cure.  We then snapped in the 18-mm poly  spacer,  reduced the knee.  It did come to full extension, flexed to 130 degrees  with minimal instability and the patella tracked normally.  The patellar  and femoral components were also checked for stability, using the  insertion device for the femoral component and going around the edge of  the patellar component to make sure there was no delamination.  Medium  Hemovac drains were then placed deep in the wound from a lateral  approach.  The wound was water picked clean, dried one more time.  The  parapatellar arthrotomy closed with running #1 Vicryl suture, the  subcutaneous tissue with 0 and 2-0 undyed Vicryl suture, and the skin  with skin staples.  A dressing of Xeroform, 4x4 dressing, sponges,  Webril and Ace wrap applied.  The tourniquet was let down.  The patient  was awakened and taken to the recovery room without difficulty.      Feliberto Gottron. Turner Daniels, M.D.  Electronically Signed     FJR/MEDQ  D:  07/01/2007  T:  07/02/2007  Job:  [id number]

## 2011-01-20 NOTE — Consult Note (Signed)
NAME:  LEGACY, CARRENDER NO.:  [id number]   MEDICAL RECORD NO.:  [id number]          PATIENT TYPE:  OUT   LOCATION:  GYN                          FACILITY:  [facility]   PHYSICIAN:  De Blanch, M.D.DATE OF BIRTH:  [birth year]   DATE OF CONSULTATION:  12/13/2007  DATE OF DISCHARGE:                                 CONSULTATION   CHIEF COMPLAINT:  Endometrial stromal sarcoma.   INTERVAL HISTORY:  The patient returns today for continuing follow-up.  Since her last visit, she has had no GYN, GI or abdominal symptoms.  She  has had to have her total knee replaced.  She also has developed a right  breast cancer and has had a lumpectomy and radiation therapy.  Otherwise, she is doing quite well.  She does not require any  chemotherapy.   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  She had been  followed since that time with no evidence disease and did not receive  any adjuvant therapy.   SOCIAL HISTORY:  The patient is married.  She does not smoke.  She works  full-time on a night shift.   PAST MEDICAL HISTORY/MEDICAL ILLNESSES:  1. Breast cancer.  2. Hypertension.  3. Obesity.  4. Diabetes.  5. Osteoarthritis.   PAST SURGICAL HISTORY:  1. Carpal tunnel release.  2. Total abdominal surgery.  3. Bilateral salpingo-oophorectomy.  4. Burch cystourethropexy.  5. Breast biopsies.  6. Breast lumpectomy.  7. Right total knee replacement 2006.  8. Right total knee replacement redo October 2008.   DRUG ALLERGIES:  None.   FAMILY HISTORY:  Negative for gynecologic, breast or colon cancer.   CURRENT MEDICATIONS:  Metformin, Carvedilol, glipizide, Simvastatin,  amlodipine, lisinopril, hydrochlorothiazide, Zetia, aspirin and  Benadryl.   REVIEW OF SYSTEMS:  A 10-point comprehensive review of systems negative  except as noted above.   PHYSICAL EXAMINATION:  VITAL SIGNS:  Weight  214 pounds, blood pressure  128/80, pulse 80, rest rate 20.  GENERAL:  The patient is a healthy black female in no acute distress.  HEENT:  Negative.  NECK:  Supple without thyromegaly.  There is no supraclavicular or  inguinal adenopathy.  ABDOMEN:  Soft, nontender.  No mass, organomegaly, ascites or hernias  noted.  PELVIC:  Exam EG/BUS, vagina, urethra are normal.  Cervix and uterus  surgically absent.  Adnexa without masses.  RECTOVAGINAL:  Exam confirms.  LOWER EXTREMITIES:  Without edema or varicosities.   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  Pap smears were obtained.  The patient  will return to see [place] in one year for continuing follow-up.      De Blanch, M.D.  Electronically Signed     DC/MEDQ  D:  12/13/2007  T:  12/14/2007  Job:  [id number]   cc:   Telford Nab, R.N.  [address]  [place], [place] [address]

## 2011-01-20 NOTE — Assessment & Plan Note (Signed)
[facility]                            CARDIOLOGY OFFICE NOTE   NAME:Bowns, NYSIA DELL                   MRN:          [id number]  DATE:06/16/2007                            DOB:          1951/03/17    PRIMARY CARE Abdirizak Richison:  [facility].   REASON FOR PRESENTATION:  Evaluate patient with cardiomyopathy.   HISTORY OF PRESENT ILLNESS:  The patient returns for followup.  She is  overdue for her yearly followup.  She is now 60 years old.  She is also  due to have right knee replacement.  She needed preoperative clearance,  but we have not seen her in quite a while.  She says that in the past  year she had done well.  She has not had any new shortness of breath,  and denies any PND or orthopnea.  Despite her knees, she is still  working.  She gets discomfort in her knees, but she does not get any  chest discomfort, neck or arm discomfort.  She has had no palpitations,  presyncope, or syncope.  She had a negative stress perfusion study in  2006, though she does have a mildly reduced ejection fraction of 48%.  This has been about 40% by echo.   PAST MEDICAL HISTORY:  1. 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  2. Diabetes mellitus.  3. Hypertension.  4. Degenerative joint disease.  5. Goiter.  6. Right knee replacement, February 2006.  7. Left knee replacement, February 2007.  8. Abdominal hysterectomy with bilateral salpingo-oophorectomy.  9. Breast surgery with breast cancer.  She has recently completed 34      radiation treatments.  10.Carpal tunnel surgery release.   ALLERGIES:  None.   MEDICATIONS:  1. Carvedilol 50 mg b.i.d.  2. Metformin 1000 mg b.i.d.  3. Hydrochlorothiazide 25 mg daily.  4. Glipizide 10 mg daily.  5. Lisinopril 40 mg daily.  6. Aspirin 81 mg daily.  7. Simvastatin 80 mg daily.   REVIEW OF SYSTEMS:  As stated in the HPI and otherwise negative for  other systems.   PHYSICAL EXAMINATION:   Patient is in no distress.  Blood pressure 118/78, heart rate 76 and regular.  HEENT:  Eyelids unremarkable, pupils are equal, round, and reactive to  light, fundi not visualized.  Oral mucosa unremarkable.  NECK:  No jugular venous distension at 45 degrees, carotid upstroke  brisk and symmetrical.  No bruit.  No thyromegaly.  LYMPHATICS:  No cervical, axillary, inguinal adenopathy.  LUNGS:  Clear to auscultation bilaterally.  BACK:  No costovertebral angle tenderness.  CHEST:  Unremarkable.  HEART:  PMI not displaced or sustained.  S1 and S2 are within normal  limits.  No S3, no S4.  No clicks, no rubs, no murmurs.  ABDOMEN:  Flat, positive bowel sounds, normal in frequency and pitch.  No bruits, no rebound, no guarding.  No midline pulsatile mass.  No  hepatomegaly, no splenomegaly.  SKIN:  No rashes, no nodules.  EXTREMITIES:  2+ pulses, no edema.   EKG sinus rhythm, 76, axis within normal limits, poor anterior R-wave  progression, nonspecific T-wave flattening.   ASSESSMENT AND PLAN:  1. Cardiomyopathy:  The patient is having no new symptoms.  She is on      a stable medical regimen.  This is presumed to be a nonischemic      cardiomyopathy with a negative Cardiolite in 2006.  At this point,      she is at acceptable risk for the knee surgery and does not need      further testing.  She does need strict I's and O's in followup.  I      do not think telemetry would be indicated.  2. Preoperative clearance:  As above.  3. Diabetes:  Per her primary care physician.  4. Hypertension:  Blood pressure is well controlled.  She will      continue on the medications as listed.     Rollene Rotunda, MD, [facility]  Electronically Signed    JH/MedQ  DD: 06/16/2007  DT: 06/16/2007  Job #: [id number]   cc:   Feliberto Gottron. Turner Daniels, M.D.  [facility]

## 2011-01-20 NOTE — Op Note (Signed)
NAME:  [name], [name]            ACCOUNT NO.:  [id number]   MEDICAL RECORD NO.:  [id number]          PATIENT TYPE:  AMB   LOCATION:  SDS                          FACILITY:  [facility]   PHYSICIAN:  Thomas A. Cornett, M.D.DATE OF BIRTH:  06-23-1951   DATE OF PROCEDURE:  DATE OF DISCHARGE:                               OPERATIVE REPORT   PREOP DIAGNOSIS:  History of right breast carcinoma, status post  lumpectomy with positive margin.   POSTOP DIAGNOSIS:  History of right breast carcinoma, status post  lumpectomy with positive margin.   PROCEDURE:  Re-excision, right breast lumpectomy.   SURGEON:  Harriette Bouillon, MD   ANESTHESIA:  General endotracheal anesthesia.   SPECIMEN:  Right breast tissue which is the entire right lumpectomy  cavity reexcised, oriented appropriately, and sent to pathology for  evaluation.   DRAINS:  None.   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  Her margins were close and positive, and I felt that  re-excision was warranted in this setting to obtain negative margins.  Unfortunately, she had other issues to attend to and was unable to  reschedule her re-excision lumpectomy until now.  I felt that re-  excision of the entire lumpectomy specimen was warranted as opposed to  just taking margins in and of themselves, and she returns today for me  to do that.   DESCRIPTION OF PROCEDURE:  The patient was brought to the operating  room, placed supine.  After LMA anesthesia was initiated, right breast  was prepped and draped in sterile fashion.  The previous incision in the  right lateral breast was identified.  I infiltrated this with local  anesthesia.  I excised an ellipse of skin and then excised the entire  lumpectomy cavity with attention to the inferior, lateral, and deep  margins, particularly.  I went through what appeared to be  native breast  tissue and did not appear to encounter the lumpectomy cavity and felt  that I got around this with a very large margin this time.  This was  oriented and sent to pathology.  She had a significant cavity which I  closed it with some 3-0 Vicryl.  Unfortunately, I did not want to close  up too much because it would have puckered the skin.  I then closed the  skin with 4-0 Monocryl.  There was a slight cosmetic defect, but I felt  that fluid would  help to replace this and push the skin up toward as it filled the cavity  and then, hopefully, scar in that position.  Dermabond was placed to  seal the skin.  All final counts of sponge, needle, and instruments were  found to be correct at this portion of the case.  The patient was then  awakened, taken to recovery in satisfactory condition.      Thomas A. Cornett, M.D.  Electronically Signed     TAC/MEDQ  D:  02/04/2007  T:  02/04/2007  Job:  [id number]

## 2011-01-20 NOTE — Discharge Summary (Signed)
NAME:  [name], [name]            ACCOUNT NO.:  [id number]   MEDICAL RECORD NO.:  [id number]          PATIENT TYPE:  INP   LOCATION:  5012                         FACILITY:  [facility]   PHYSICIAN:  Feliberto Gottron. Turner Daniels, M.D.   DATE OF BIRTH:  10-Mar-1951   DATE OF ADMISSION:  07/01/2007  DATE OF DISCHARGE:  07/04/2007                               DISCHARGE SUMMARY   FINAL DIAGNOSIS:  Grossly loose right total knee component.   PROCEDURE WHILE IN HOSPITAL:  Revision of right total knee arthroplasty.   HISTORY OF PRESENT ILLNESS:  The patient is a 60 year old woman who had  total knee replacement by Dr. Thurston Hole several years prior to this  admission.  The component has become grossly loose, tip of component has  shifted into varus and has positive windshield wiper sign on x-ray.  There has been gross movement over the serial x-rays of the last several  months.  She has gone into a varus deformity and has severe unremitting  pain.  Workup has included aspiration which was negative for infection.  She has never had any infectious signs or symptoms.  In any event,  because of pain and lack of function, the patient desires to proceed  with elective revision total knee arthroplasty of the right knee with  plan to revise only the tibia assuming patella and femur are not loose.  The risks and benefits of the surgery were discussed and the patient  wishes to proceed with the operation after this.   PAST MEDICAL HISTORY:  1. Significant for allergy to ampicillin which she did not actually      remember initially.  2. Usual childhood diseases.  3. History of heart disease.  4. Hypertension.  5. Diabetes.  6. Arthritis.  7. Breast cancer.   PAST SURGICAL HISTORY:  1. Mastectomy.  2. Knee replacement.  3. No difficulty with GET.   CURRENT MEDICATIONS AT TIME OF ADMISSION:  1. Zetia.  2. Metformin.  3. Glipizide.  4. Zocor.  5. Amiodarone.  6. Lisinopril.  7. Hydrochlorothiazide.  8. Baby  aspirin.  9. Diclofenac.   FAMILY HISTORY:  Significant for mother alive at 89 with history of  diabetes and DJD.  Father history unsure.   REVIEW OF SYSTEMS:  Positive for upper and lower dentures.  Reading  glasses.  Denies any recent illness, chest pain, or shortness of breath.   SOCIAL HISTORY:  The patient is married, lives with an able-bodied  husband.  She does not use tobacco or alcohol.  No history of IV drug  abuse.   PHYSICAL EXAMINATION:  The patient's physical examination is not  available for review at the time of this discharge summary.  However,  there were no abnormalities noted that would interfere with proceeding  with the procedure.   ADMITTING LABORATORY DATA:  Admitting labs including a CBC, CMP, chest x-  ray, EKG, PT and PTT were all within normal limits with the exception of  hemoglobin of 11.6, hematocrit of 34.4, potassium of 3.3, albumin of  3.4.   HOSPITAL COURSE:  On the date of admission, the patient was  taken to the  Operating Room at [facility] where she underwent a revision  right total knee arthroplasty after removal of loose #9 Osteonics  Scorpio tibial component and revision to an Osteonics #9 PS component  with a 10 mm augmentation block and a 15 x 155 stem Triflange.  Components were all cemented.  A medium Hemovac was placed in the wound  and double-armed.  A Foley catheter was placed perioperative.  The  patient was placed on perioperative antibiotics, placed on postoperative  Coumadin prophylaxis with a target INR of 1.5 to 2 and bridging Lovenox  until she became therapeutic.  Physical therapy was begun on  postoperative day one.  The patient was placed on a PCA Dilaudid pump  for pain control.  Postop day one, the patient was complaining of  moderate pain, taking p.o.'s well.  T-max 99.6.  Hemoglobin 10.2.  Vitals were stable.  INR 1.1.  Dressing was dry.  Drain was intact.  Physical therapy was begun in earnest.  Postoperative  day two, the  patient was taken p.o., voiding well, progressing well with physical  therapy.  She was afebrile.  Knee dressing was clean and dry.  Neurovascularly intact.  Hemoglobin 10.5, INR 1.2.  The drain was  discontinued.  IV and PCA pump were discontinued.  She continued with  physical therapy, but had not yet met physical therapy goals.  Postoperative day three, the patient was without complaint, she was  afebrile.  Hemoglobin 9.5, INR 1.1.  Dressing was dry, wound was fine,  calf soft and nontender.  She was otherwise medically stable and  orthopedically improved, and was discharged home after passing her  physical therapy goals for safe transfer and ambulation in independent  manner.   DISCHARGE INSTRUCTIONS:  Diet is ADA.  Dressing changes daily.  Return  to clinic in one week's time with [facility].  Call for appointment [phone].  Home Health PT, Home Health RN, CPM per [facility].  Home med rec sheet is  signed and all medications are continued other than the diclofenac and  aspirin.  She was also given prescription for Tylox and Coumadin.  She  will be followed for Coumadin by her Home Health agency.  We will also  provide DMEs  necessary for her daily care.      [name]. [name].      Feliberto Gottron. Turner Daniels, M.D.  Electronically Signed   JBR/MEDQ  D:  09/04/2007  T:  09/05/2007  Job:  [id number]

## 2011-01-20 NOTE — Assessment & Plan Note (Signed)
[facility]                            CARDIOLOGY OFFICE NOTE   NAME:[name], [name]                   MRN:          [id number]  DATE:06/22/2008                            DOB:          13-Jun-1951    PRIMARY CARE PHYSICIAN:  Ruthe Mannan, MD   REASON FOR PRESENTATION:  Evaluate the patient with fatigue and  cardiomyopathy.   HISTORY OF PRESENT ILLNESS:  The patient returns for yearly followup.  She is now 60 years old.  Since I last saw her, she has had no acute  cardiac problems.  She did have some chest pain requiring an ER visit in  May.  This was felt to be chest wall pain.  Her biggest complaint has  been fatigue.  She has been profoundly fatigued during her waking hours.  She said even just walking from the parking lot into the building, she  felt like she wanted to sit down.  She is not describing overt shortness  of breath.  She is not having any chest discomfort, neck, or arm  discomfort.  She does not have any palpitation, presyncope, or syncope.  She denies any PND or orthopnea.  She may get dyspneic doing some more  moderate exertion like climbing a flight of stairs.  Her problem is  simply tiredness.  By her report, however, she only sleeps 4 hours or  less per day.  She works in [employer]  She has had blood work, she  reports, though I cannot find this in the [facility] system.  She has been  told that she was not hypothyroid or anemic, but again I do not have  documentation of this.   PAST MEDICAL HISTORY:  Mildly reduced ejection fraction felt to be  nonischemic (EF 40-45%, negative nuclear study), diabetes mellitus,  hypertension, degenerative joint disease, goiter, right knee replacement  in February 2006, left knee replacement in February 2007, abdominal  hysterectomy with bilateral salpingo-oophorectomy, breast surgery with  breast cancer (she has completed 34 radiation treatments), and carpal  tunnel surgery.   ALLERGIES:   None.   MEDICATIONS:  1. Carvedilol 50 mg b.i.d.  2. Metformin 1000 mg b.i.d.  3. Hydrochlorothiazide 25 mg daily.  4. Glipizide 10 mg daily.  5. Lisinopril 40 mg daily.  6. Aspirin 81 mg daily.  7. Simvastatin 80 mg daily.  8. Amlodipine 10 mg daily.  9. Zetia 10 mg daily.  10.Diclofenac 75 mg b.i.d.   REVIEW OF SYSTEMS:  As stated in HPI and otherwise negative for other  systems.   PHYSICAL EXAMINATION:  GENERAL:  The patient is in no distress.  VITAL SIGNS:  Blood pressure 113/74, heart rate 84 and regular, weight  219 pounds, body mass index 33.  HEENT:  Eyelids unremarkable.  Pupils are equal, round, and reactive to  light.  Fundi not visualized.  Oral mucosa unremarkable.  NECK:  No jugular distention at 45 degrees.  Carotid upstroke is brisk  and symmetric.  No bruits.  No thyromegaly.  LYMPHATICS:  No cervical, axillary, or inguinal adenopathy.  LUNGS:  Clear to auscultation bilaterally.  BACK:  No costovertebral angle tenderness.  CHEST:  Unremarkable.  HEART:  PMI not displaced or sustained.  S1 and S2 within normal limits.  No S3, no S4, no clicks, no rubs, no murmurs.  ABDOMEN:  Obese, positive bowel sounds normal in frequency and pitch.  No bruits, rebound, guarding, or midline pulsatile mass.  No  hepatomegaly, splenomegaly.  SKIN:  No rashes, no nodules.  EXTREMITIES:  2+ pulse throughout, no edema, no cyanosis, or clubbing.  NEUROLOGIC :  Oriented to person, place, and time.  Cranial nerves II  through XII are grossly intact, motor grossly intact throughout.   ASSESSMENT/PLAN:  1. Cardiomyopathy.  The patient has a mildly reduced ejection      fraction.  She has had pronounced fatigue.  At this point, I am      going to check another echocardiogram as it has been 3 years.      However, I am not strongly suspecting a cardiac etiology.  This may      very well be related to her bad sleeping habits.  I deferred her      primary care doctor any a workup to  include vitamin D levels,      anemia workup, and thyroid.  She is due to see them soon.  2. Obesity.  We discussed the need to lose weight with diet and      exercise.  3. Diabetes mellitus.  Her hemoglobin A1c was 8.3.  This is being      followed by her primary care physician.  4. Hypertension.  Blood pressure is controlled, and she will continue      the medicines as listed.  5. Followup.  I will see her back in 1 year or sooner based on the      results of the above study.     Rollene Rotunda, MD, [facility]  Electronically Signed    JH/MedQ  DD: 06/22/2008  DT: 06/22/2008  Job #: [id number]   cc:   Ruthe Mannan, M.D.

## 2011-01-20 NOTE — Group Therapy Note (Signed)
NAME:  [name], [name] NO.:  [id number]   MEDICAL RECORD NO.:  [id number]          PATIENT TYPE:  OUT   LOCATION:  GYN                          [facility]ACILITY[facility]  [facility]   PHYSICIAN:  De Blanch, M.D.DATE OF BIRTH:  June 07, 1951                                 PROGRESS NOTE   CHIEF COMPLAINT:  Endometrial stromal sarcoma.   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  Since her last visit she has done  well.  She denies any GI or GU symptoms.  Has no pelvic pain, pressure,  vaginal bleeding or discharge.  She does not have any urinary  incontinence.   HISTORY OF PRESENT ILLNESS:  In 1995 the patient underwent a total abdominal hysterectomy with  bilateral salpingo-oophorectomy and Burch cystourethropexy.  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  She has  been followed since that time with no evidence of recurrent disease.  She did not receive any adjuvant therapy.   SOCIAL HISTORY:  The patient is married.  She does not smoke.  She works full time on the  night shift.   PAST MEDICAL HISTORY AND MEDICAL ILLNESSES:  1. Breast cancer.  2. Hypertension.  3. Obesity.  4. Diabetes.  5. Osteoarthritis.   She gets her medical care from the [facility].   PAST SURGICAL HISTORY:  1. Carpal tunnel release.  2. Total abdominal hysterectomy.  3. Bilateral salpingo-oophorectomy.  4. Burch cystourethropexy.  5. Breast biopsies.  6. Breast lumpectomy.  7. Right total knee replacement in 2006.  8. Right total knee replacement redo, October 2008.   DRUG ALLERGIES:  None.   FAMILY HISTORY:  Negative for gynecologic, breast or colon cancer.   CURRENT MEDICATIONS:  1. Metformin.  2. Carbedilol.  3. Glipizide.  4. Simvastatin.  5. Amlodopine.  6. Lisinopril.  7. Hydrochlorothiazide.  8. Zetia.  9. Aspirin.  10.Benadryl.   REVIEW OF SYSTEMS:  A 10-point comprehensive review of systems was negative, except as noted  above.   PHYSICAL EXAMINATION:  Weight 223 pounds, blood pressure 142/80.  GENERAL:  The patient is a healthy African American female in no acute  distress.  HEENT:  Negative.  NECK:  Supple without thyromegaly.  There is no supraclavicular or  inguinal adenopathy.  ABDOMEN:  Soft, nontender.  No masses or organomegaly, ascites or  hernias noted.  PELVIC EXAM:  EG BUS.  Vagina, bladder and urethra are normal.  Cervix  and uterus surgically absent.  Adnexa without masses.  Rectovaginal exam  confirms.  EXTREMITIES:  Lower extremities 1+ ankle edema.   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   PLAN:  The patient will return in one year for continuing followup.      De Blanch, M.D.  Electronically Signed     DC/MEDQ  D:  02/01/2009  T:  02/01/2009  Job:  [id number]   cc:   Telford Nab, R.N.  [address]  [place], [place] [address]   [facility]

## 2011-01-23 NOTE — Discharge Summary (Signed)
NAME:  [name], [name]            ACCOUNT NO.:  [id number]   MEDICAL RECORD NO.:  [id number]          PATIENT TYPE:  INP   LOCATION:  5015                         FACILITY:  [facility]   PHYSICIAN:  Robert A. Thurston Hole, M.D. DATE OF BIRTH:  05-20-1951   DATE OF ADMISSION:  10/07/2005  DATE OF DISCHARGE:  10/11/2005                                 DISCHARGE SUMMARY   ADMISSION DIAGNOSES:  1.  End-stage degenerative joint disease, left knee.  2.  Hypertension.  3.  Diabetes.  4.  Coronary artery disease.   DISCHARGE DIAGNOSES:  1.  End-stage degenerative joint disease, left knee.  2.  Hypertension.  3.  Diabetes.  4.  Coronary artery disease.  5.  Chronic obstructive pulmonary disease.  6.  Postoperative blood loss anemia.  7.  Hypokalemia.   HISTORY OF PRESENT ILLNESS:  The patient is a 60 year old black female who  has end-stage DJD of her left knee.  She has failed conservative care,  including anti-inflammatories, intra-articular cortisone injections, intra-  articular hyaluronic acid injections, and arthroscopic debridement.  She  understands the risks, benefits, and possible complications of a left total  knee replacement and is without question because she has had a right total  knee replacement.   PROCEDURE:  On October 07, 2005, the patient underwent a left total knee  replacement by Dr. Thurston Hole and a left femoral block by anesthesia.  She  tolerated both procedures well and was admitted for pain control, DVT  prophylaxis, and physical therapy.   HOSPITAL COURSE:  Coumadin was started on postoperative day #0 in the  evening as well as Lovenox 30 mg subcutaneously for DVT prophylaxis.   On postoperative day #1, T max was 100.5, pulse 102.  Her INR was 1.1,  hemoglobin 9.1, potassium low at 3.2; otherwise, she was metabolically  stable.  She was given potassium 40 mEq b.i.d.  Her dressing was changed.  Her PCA was discontinued.  She was started on Percocet for pain, and an  order was written to add wheels for her walker.  The diabetes team was  consulted to help manage her diabetes.  They added Lantus 20 units q.h.s.  and mealtime.  The patient mobilized on postoperative day #1 with minimal  assist and 15 feet.   On postoperative day #2, the patient continued to improve.  She had no  dizziness, no shortness of breath.  Blood pressure was 98/62, pulse 105.  Hemoglobin was 8.2.  Her INR was 1.3.  Her IV was discontinued.   On postoperative day #3, hemoglobin was 7.9.  She was transfused 2 units of  packed red blood cells.  Her surgical wound was well-approximated.   On postoperative day #4, her hemoglobin was 9.6 following 2 units of packed  red blood cells.  She felt much better and continued to improve.  She was  discharged to home in stable condition, weightbearing as tolerated, on a  diabetic diet, using a CPM 0-90 degrees eight hours a day, elevating her  left heel on a folded pillow for 30 minutes every morning.  She is to change  her  dressing daily.  She is to call with increased redness, increased  swelling, increased drainage or pain.  She is also to call if she has a  temperature greater than 101.  She will receive home health physical  therapy, occupational therapy, and will return to the office in 10 days for  suture removal and x-rays.      Kirstin Shepperson, P.A.      Robert A. Thurston Hole, M.D.  Electronically Signed    KS/MEDQ  D:  11/14/2005  T:  11/16/2005  Job:  [id number]

## 2011-01-23 NOTE — Op Note (Signed)
NAME:  [name], [name]            ACCOUNT NO.:  [id number]   MEDICAL RECORD NO.:  [id number]          PATIENT TYPE:  AMB   LOCATION:  SDS                          FACILITY:  [facility]   PHYSICIAN:  Thomas A. Cornett, M.D.DATE OF BIRTH:  1951-03-10   DATE OF PROCEDURE:  11/17/2006  DATE OF DISCHARGE:  11/17/2006                               OPERATIVE REPORT   PREOPERATIVE DIAGNOSIS:  Right breast ductal carcinoma in situ.   POSTOPERATIVE DIAGNOSIS:  Right breast ductal carcinoma in situ.   PROCEDURE:  Right breast needle-localized lumpectomy.   SURGEON:  Maisie Fus A. Cornett, MD   ANESTHESIA:  MAC with 0.25% Sensorcaine.   ESTIMATED BLOOD LOSS:  10 mL.   SPECIMEN:  Right breast tissue with calcifications and wire to  pathology.   INDICATIONS FOR PROCEDURE:  The patient is a 60 year old postmenopausal  female who had biopsy-proven right breast DCIS.  She presents today for  lumpectomy.   DESCRIPTION OF PROCEDURE:  The patient was brought to the operating room  after undergoing right breast needle localization.  The right breast was  then prepped and draped in a sterile fashion.  MAC anesthesia and local  anesthesia were infiltrated.  Incision was made around the wire and  dissection was carried down circumferentially to encompass the mass.  The entire area was excised, sent to pathology for evaluation.  An  additional right medial margin was also excised.  Radiographs revealed  this to be adequate.  The wound was closed in layers with 3-0 Vicryl and  4-0 Monocryl.  All final counts of sponge, needle and instruments were  found be correct for this portion of the case.  The patient was taken to  recovery in satisfactory condition after placement of sterile dressings.      Thomas A. Cornett, M.D.  Electronically Signed     TAC/MEDQ  D:  12/10/2006  T:  12/10/2006  Job:  [id number]

## 2011-01-23 NOTE — Consult Note (Signed)
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   INTERVAL HISTORY:  Since her last visit, the patient has had no gynecologic  problems.  She denies any GI or GU symptoms.  She has no pelvic pain,  pressure, vaginal bleeding or discharge.   Last year, she had a right total knee replacement and that is improving  significantly.  She is scheduled to have a left total knee replacement in  January 2003.  She is going to have mammograms next Friday.   HISTORY OF PRESENT ILLNESS:  The patient underwent TAH/BSO and Burch  cystourethropexy in 1995.  At that time, she was found to have a low-grade  endometrial stromal sarcoma.  She has been followed since then with no  evidence of recurrent disease.   SOCIAL HISTORY:  The patient is married.  She does not smoke.  She works  full-time on night shift.   PAST SURGICAL HISTORY:  1.  Carpal tunnel release.  2.  Total abdominal hysterectomy.  3.  Bilateral salpingo-oophorectomy.  4.  Breast biopsies.  5.  Right total knee replacement in 2006.   ALLERGIES:  No known drug allergies.   FAMILY HISTORY:  Negative for gynecologic, breast or colon cancer.   PAST MEDICAL HISTORY:  1.  Obesity.  2.  Diabetes.  3.  Hypertension.  4.  Osteoarthritis of the knees.   REVIEW OF SYSTEMS:  A 10-point comprehensive review of systems negative  except as noted above.   PHYSICAL EXAMINATION:  VITAL SIGNS:  Weight 222 pounds, blood pressure  130/70, pulse 80, respirations 20.  GENERAL:  The  patient is a healthy, black female in no acute distress.  HEENT:  Negative.  NECK:  Supple without thyromegaly.  No supraclavicular or inguinal  adenopathy.  ABDOMEN:  Obese, soft, nontender.  No masses, organomegaly, ascites or  hernias noted.  PELVIC:  EG/BUS reveals two large sebaceous cysts on the right labia majora.  These are entirely asymptomatic.  Bimanual and rectovaginal exam reveal no  masses, induration or nodularity.  EXTREMITIES:  Lower extremities reveal a well-healed knee scar.   IMPRESSION:  Stage I, low-grade endometrial stromal carcinoma in 1995.  No  evidence of recurrent disease.   PLAN:  Pap smear was obtained.  The patient will follow through with  mammograms as scheduled for next Friday.  She will return to see me in 1  year for a continuing surveillance.      De Blanch, M.D.  Electronically Signed     DC/MEDQ  D:  08/07/2005  T:  08/07/2005  Job:  [id number]   cc:   Telford Nab, R.N.  [address]  [place], [place] [address]   Bing Neighbors. Clearance Coots, M.D.  Fax: [phone]   [facility]

## 2011-01-23 NOTE — Consult Note (Signed)
[facility]  Patient:    [name], [name] Visit Number: [id number] MRN: [id number]          Service Type: GON Location: GYN Attending Physician:  Jeannette Corpus Dictated by:   Rande Brunt. Clarke-Pearson, M.D. Proc. Date: 05/10/01 Admit Date:  05/10/2001   CC:         Heath Gold, M.D.  Telford Nab, R.N.  Charles A. Clearance Coots, M.D.   Consultation Report  REASON FOR CONSULTATION:  The patient is a 60 year old African-American female who returns for continue in followup of a low grade endometrial stromal sarcoma initially resected with abdominal hysterectomy in 1995.  She has been followed with no evidence of recurrent disease.  INTERVAL HISTORY:  Since her last visit she has done well.  She denies any GI or GU symptoms, has no pelvic pain, pressure, vaginal bleeding, or discharge. She has continued taking Depo-Provera 150 mg every three months.  She has no hot flashes.  Her main complaint is that of pain in her knees.  REVIEW OF SYSTEMS:  Negative, except as noted above.  She does have hypertension, diabetes mellitus.  Mammograms are scheduled on an annual basis through the [facility].  FAMILY HISTORY:  Reviewed, and unchanged.  SOCIAL HISTORY:  Reviewed, and unchanged.  The patient works full-time.  PHYSICAL EXAMINATION:  VITAL SIGNS:  Weight 200 pounds (stable), blood pressure 132/100.  GENERAL:  The patient is a healthy black female in no acute distress.  HEENT:  Negative.  NECK:  Supple without thyromegaly.  There is no supraclavicular or inguinal adenopathy.  ABDOMEN:  Soft, nontender, no mass, organomegaly, ascites, hernias are noted.  PELVIC:  EGBUS is normal.  Vagina is clean, well supported, no lesions are noted.  Bimanual and rectovaginal exam reveal no masses, induration, or nodularity.  IMPRESSION: 1. Endometrial stromal carcinoma, status post total abdominal hysterectomy in    1995, no  evidence of recurrent disease. 2. Hot flashes, currently being managed with Depo-Provera.  Pap smears are repeated today.  The patient returns to see [place] in one year for continuing followup. Dictated by:   Rande Brunt. Clarke-Pearson, M.D. Attending Physician:  Jeannette Corpus DD:  05/10/01 TD:  05/10/01 Job: [id number]

## 2011-01-23 NOTE — Cardiovascular Report (Signed)
[place]  Patient:    [name], [name]                     MRN: [id number] Proc. Date: 08/30/00 Adm. Date:  [id number] Attending:  Willow Ora CC:         Asencion Partridge, M.D., Teaching Service, [facility]  Gerrit Friends. Dietrich Pates, M.D. [facility]  Cardiopulmonary Lab   Cardiac Catheterization  INDICATIONS:  Ms. [name] is 59 years old and has no prior history of known heart disease, but does have multiple risk factors, including hypertension, diabetes and hyperlipidemia.  She was admitted with chest pain suggestive of angina and seen in consultation by Dr. Dietrich Pates and scheduled for catheterization.  PROCEDURE:  The procedure was performed with a right femoral arterial sheath and 6-French preformed coronary catheters.  The following ______ were performed and Omnipaque contrast was used.  A distal aortogram was performed to rule out renal vascular causes for hypertension.  The patient tolerated the procedure well and left the laboratory in satisfactory condition.  RESULTS:  The aortic pressure was 100/62 with a mean of 77.  Left ventricular pressure was 100/4.  The left main coronary artery was free of significant disease.  Left anterior descending artery gave rise to two diagonal branches and two septal perforators.  These in the ______   were free of significant disease.  The circumflex artery gave rise to an intermediate branch, marginal branch, an atrial branch and a posterolateral branch.  These vessels were free of significant disease.  The right coronary artery was a small to moderate-sized vessel.  It gave rise only to a posterior descending branch.  This vessel was free of significant disease.  LEFT VENTRICULOGRAM:  Performed in the RAO projection showed global hypokinesis with an estimated ejection fraction of 40%.  DISTAL AORTOGRAM:  Showed no renal artery stenosis and no significant aortoiliac obstruction.  CONCLUSION: 1.  Normal coronary angiography. 2. Mild to moderate global left ventricular dysfunction with an ejection    fraction of 40%.  RECOMMENDATIONS:  Patient has no source for ischemia, but does have left ventricular dysfunction.  This indicates a cardiomyopathy of some etiology. This could possibly be diabetic.  There is no alcohol history.  She is currently on Ace and I would recommend further evaluation of her LV function with an echocardiogram and continued treatment with the Ace inhibitor.  She may be able to go home later today. DD:  08/30/00 TD:  08/30/00 Job: [id number]

## 2011-01-23 NOTE — Consult Note (Signed)
NAME:  [name], [name]                        ACCOUNT NO.:  [id number]   MEDICAL RECORD NO.:  [id number]                   PATIENT TYPE:  OUT   LOCATION:  GYN                                  FACILITY:  [facility]   PHYSICIAN:  De Blanch, M.D.         DATE OF BIRTH:  04-07-1951   DATE OF CONSULTATION:  07/11/2003  DATE OF DISCHARGE:                                   CONSULTATION   REASON FOR CONSULTATION:  A 60 year old African-American female returns for  continuing followup of low grade endometrial stromal sarcoma.   INTERVAL HISTORY:  Since her last visit, the patient has done well. She has  had no change in any medical problems. She specifically denies any GI or GU  symptoms and has no pelvic pain, pressure, vaginal bleeding or discharge.  Her functional status is excellent.   HISTORY OF PRESENT ILLNESS:  The patient underwent total abdominal  hysterectomy and bilateral salpingo-oophorectomy and a Burch  cystourethropexy in 1995. She was found to have a low grade endometrial  stromal sarcoma at that time and has been followed subsequently with no  evidence of recurrent disease. She uses Depo-Provera every three months  administered at the [facility].   SOCIAL HISTORY:  The patient is married, she does not smoke, she works full  time.   PAST SURGICAL HISTORY:  1. Carpal tunnel release.  2. Total abdominal hysterectomy.  3. Bilateral salpingo-oophorectomy.  4. Breast biopsies.   ALLERGIES:  None.   FAMILY HISTORY:  Negative for gynecologic, breast or colon cancers.   MEDICAL ILLNESSES:  1. Hypertension.  2. Diabetes.   REVIEW OF SYMPTOMS:  Negative.   PHYSICAL EXAMINATION:  VITAL SIGNS:  Weight 207 pounds, blood pressure  142/88.  GENERAL:  The patient is a healthy African-American female in no acute  distress.  HEENT:  Negative.  NECK:  Supple without thyromegaly. There was no supraclavicular or inguinal  adenopathy.  ABDOMEN:  Soft,  nontender, no mass, organomegaly, ascites or hernias are  noted.  PELVIC:  EGBUS, vagina, bladder, urethra are normal except for modest  cystocele and rectocele. No lesions are noted. Bimanual and rectovaginal  exam reveal no masses, induration or nodularity.   IMPRESSION:  Endometrial stromal sarcoma in 1995, no evidence of recurrent  disease.   PLAN:  Pap smears are obtained.   At the end of the conversation, the patient indicated that she has had a  minimal amount of pruritus associated with her rectocele and cystocele.  Given that I see no lesions to biopsy, I think we will treat this  symptomatically using Aristocort 0.1% twice a day as needed for pruritus.  She was given a prescription for this. She will return to see me in one year  for continuing followup.  De Blanch, M.D.    DC/MEDQ  D:  07/11/2003  T:  07/11/2003  Job:  [id number]   cc:   Telford Nab, R.N.  [address]  [place], [place] [address]   Bing Neighbors. Clearance Coots, M.D.  [address]  [place]  [place] [address]  Fax: [phone]   [facility]

## 2011-01-23 NOTE — Discharge Summary (Signed)
[place]  Patient:    [name], BEGIN                     MRN: [id number] Adm. Date:  [id number] Disc. Date: 08/30/00 Attending:  Willow Ora                           Discharge Summary  DATE OF BIRTH:  June 08, 1951.  DISCHARGE DIAGNOSES: 1. Chest pain, rule out unstable angina versus myocardial infarction. 2. Shortness of breath and dyspnea on exertion. 3. 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 4. Hypertension. 5. Diabetes mellitus, Type 2. 6. Hypercholesterolemia. 7. Goiter with normal thyroid function. 8. Osteoarthritis.  DISCHARGE MEDICATIONS: 1. Aspirin q.d. 2. Glucophage 1 g p.o. b.i.d. 3. Glucotrol 5 mg p.o. q.d. 4. HCTZ 25 mg p.o. q.d. 5. Prinivil 40 mg p.o. q.d. 6. Zocor 20 mg p.o. q.d. 7. Depo-Provera 300 mg IM q 3 months.  ACTIVITY:  Patient is to avoid any heavy lifting or exertion x 3 days.  DIET:  1800-calorie ADA diet.  FOLLOW-UP:  With Dr. Arlie Solomons at [facility] in the next 1-2 weeks for a recheck.  CONSULTATIONS:  Dr. Juanda Chance in cardiology, December 23.  STUDIES: 1. Cardiac catheterization on December 24 demonstrated normal coronaries,    global hypokinesis with an EF of 40%. 2. 2D echocardiogram on December 24 is pending.  BRIEF HISTORY OF PRESENT ILLNESS:  This is a very pleasant 60 year old African-American female with multiple cardiac risk factors who presented to the [facility] with 2 weeks of gradually worsening substernal chest pressure associated with exertion, diaphoresis, nausea, and shortness of breath.  She had also had increasing fatigue and dyspnea on exertion.  She was admitted to the hospital for what was thought to be unstable angina.  HOSPITAL COURSE:  Problem 1. Chest pain.  The patient is admitted to telemetry.  She was continued on her home medication regimen.  Since she was not having chest pain, she was not started on IV  nitroglycerin or heparin.  She subsequently did not develop any further chest pain while in the hospital.  Her cardiac enzymes were negative.  Follow-up EKG was normal sinus rhythm.  She remained in sinus rhythm while on telemetry.  Cardiology was consulted, and she underwent cardiac catheterization by Dr. Juanda Chance which demonstrated normal coronary arteries and global hypokinesis with an estimated EF of 40%.  Her cardiomyopathy is felt to be secondary either to diabetes mellitus versus idiopathic since she does not have an alcohol history or other risk factors for a cardiomyopathy.  She is discharged on her home medication regimen which already includes an ACE inhibitor.  She will be followed up at [facility].  If she continues to have shortness of breath and dyspnea on exertion, we can increase her ACE inhibitor.  She underwent 2D echo on December 24 which is still pending.  We will check that result when she follows up.  Problem 2. Hypertension.  Upon admission, she was started on a beta blocker; however, since she does not have any significant coronary artery disease, she is switched back to her medication regimen at discharge which had controlled her blood pressure well prior to admission.  Problem 3. Diabetes.  Her sugars remained stable while in house.  We did hold her Glucophage for her cardiac catheterization.  This was restarted at discharge.  She is followed by nutrition  diabetic management, and she will continue to attend classes.  She is very determined to improve her diet and begin exercise.  Problem 4. Hypercholesterolemia.  The patient has been on Zocor.  A fasting lipid panel was checked while in house which demonstrated a total cholesterol 156, triglycerides 59, HDL 66, LDL 78.  She will continue on the same dose of Zocor.  Subsequently, her liver enzymes were also normal on this admission.  Problem 5. Goiter.  A full RA panel was checked and was all within  normal limits.  She will continue to have q 6 months thyroid ultrasounds to follow up the hypoacholic area in the lower pole.  She otherwise remains stable. Follow-up is as above with Arlie Solomons at [facility] in the next 1-2 weeks. DD:  08/30/00 TD:  08/30/00 Job: [id number]

## 2011-01-23 NOTE — Consult Note (Signed)
[facility]  Patient:    [name], [name]                     MRN: [id number] Adm. Date:  [id number] Attending:  Jeannette Corpus CC:         [facility]             Telford Nab, N.P.             Bing Neighbors. Clearance Coots, M.D.                          Consultation Report  CHIEF COMPLAINT:  This 60 year old black female returns for continuing followup of an endometrial stromal sarcoma (low grade), initially diagnosed in 1995.  She had an abdominal hysterectomy and has been followed since that time with no evidence of recurrent disease.  HISTORY OF PRESENT ILLNESS:  She has developed menopause over the past year, and has been taking Depo-Provera 150 mg q.3 months.  Prior to the end of that three-month interval, she starts having hot flashes, and is fairly uncomfortable with these.  Otherwise the patient has no GI or GU symptoms.  She has no pelvic  pain, pressure, vaginal bleed, or discharge.  REVIEW OF SYSTEMS:  The patient is being managed in the [facility] for hypertension and diabetes mellitus.  FAMILY HISTORY/SOCIAL HISTORY:  Is unchanged.  The patient continues to work full-time.  PHYSICAL EXAMINATION:  VITAL SIGNS:  Weight 196 pounds, blood pressure 130/80.  GENERAL:  The patient is a healthy woman, in no acute distress.  HEENT:  Negative.  NECK:  Supple without thyromegaly.  NODES:  There is no supraclavicular or inguinal adenopathy.  ABDOMEN:  Soft, nontender.  No masses or organomegaly, ascites, or hernia noted.  PELVIC:  EG, BUS normal.  Vagina is clean, well-supported.  BIMANUAL/RECTOVAGINAL:  Examinations reveal no masses, induration, or nodularity.  EXTREMITIES:  Lower extremities are without varicosities or edema.  IMPRESSION: 1. Endometrial stromal sarcoma, status post total abdominal hysterectomy in    1995.  No evidence of recurrent disease. 2. Hot  flashes.  PLAN:  We will increase her dose of Depo-Provera to 300 mg q. three months. Pap smears are returned today.  She will return to see me in one year for continued  followup. DD:  03/17/00 TD:  03/17/00 Job: [id number]

## 2011-01-23 NOTE — Consult Note (Signed)
NAME:  [name], [name] NO.:  [id number]   MEDICAL RECORD NO.:  [id number]          PATIENT TYPE:  OUT   LOCATION:  GYN                          FACILITY:  [facility]   PHYSICIAN:  De Blanch, M.D.DATE OF BIRTH:  1951-08-24   DATE OF CONSULTATION:  08/05/2004  DATE OF DISCHARGE:                                   CONSULTATION   A 60 year old African-American female returns for continuing follow-up of a  low grade endometrial stromal sarcoma initially diagnosed in 1995.   INTERVAL HISTORY:  Since her last visit patient has had no gynecologic  symptoms.  She denies any pelvic pain, pressure, vaginal bleeding,  discharge, or any GI/GU symptoms.  She has had difficulty with pain in her  right knee and is scheduled to undergo total knee replacement in the near  future.  She has also stopped her Depo Provera approximately a year ago.  She does have hot flushes, but claims she is tolerating them sufficiently.  She had mammograms recently that were negative.   HISTORY OF PRESENT ILLNESS:  In 1995 the patient underwent a total abdominal  hysterectomy, bilateral salpingo-oophorectomy, and Burch cystourethropexy.  Final pathology showed a low grade endometrial stromal sarcoma.  The patient  received no adjuvant therapy and has been followed with no evidence of  recurrent disease for the past 10 years.   SOCIAL HISTORY:  The patient is married.  She does not smoke.  She works  full-time on the night shift.   PAST SURGICAL HISTORY:  1.  Carpal tunnel release.  2.  Total abdominal hysterectomy.  3.  Bilateral salpingo-oophorectomy.  4.  Breast biopsies.   ALLERGIES:  None.   FAMILY HISTORY:  Negative for gynecologic, breast, or colon cancer.   PAST MEDICAL HISTORY:  1.  Diabetes.  2.  Hypertension.  3.  Osteoarthritis of the knees.   REVIEW OF SYSTEMS:  Negative except as noted above.   PHYSICAL EXAMINATION:  VITAL SIGNS:  Weight 214 pounds.  GENERAL:  The  patient is a healthy black female in no acute distress.  HEENT:  Negative.  NECK:  Supple without thyromegaly.  LYMPH:  There is no supraclavicular or inguinal adenopathy.  BREASTS:  Without masses, discharge, or skin changes.  ABDOMEN:  Obese, soft, nontender.  No masses, organomegaly, ascites, or  hernias are noted.  PELVIC:  EGBUS, vagina, bladder, urethra are normal.  There are no lesions  normal.  Bimanual and rectovaginal examination reveal no masses, induration,  or nodularity.  EXTREMITIES:  Lower extremities without edema or varicosities.  BACK:  Without pain.   IMPRESSION:  Endometrial stromal sarcoma 1995.  No evidence of recurrent  disease.   PLAN:  Pap smears are obtained.  The patient will return to see [place] in one  year.  She is encouraged to watch her weight and begin a weight reduction  program.     Valentino Saxon   DC/MEDQ  D:  08/05/2004  T:  08/05/2004  Job:  [id number]   cc:   Telford Nab, R.N.  [address]  [place], [place] [address]   Bing Neighbors. Clearance Coots, M.D.  [facility]

## 2011-01-23 NOTE — Assessment & Plan Note (Signed)
[facility]                              CARDIOLOGY OFFICE NOTE   NAME:[name]                   MRN:          [id number]  DATE:04/30/2006                            DOB:          [birth year]    PRIMARY CARE PHYSICIAN:  [facility].   REASON FOR PRESENTATION:  A patient with cardiomyopathy.   HISTORY OF PRESENT ILLNESS:  The patient returns for followup.  She has had  her knee replaced since I last saw her.  She did well with this.  She is  tired because she does not sleep well.  She works nights.  She denies any  chest discomfort or shortness of breath.  She has had no PND or orthopnea.  She has had no palpations, presyncope, or syncope.   PAST MEDICAL HISTORY:  1. 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  2. Diabetes mellitus.  3. Hypertension.  4. Degenerative joint disease.  5. Goiter.  6. Right knee replacement, February 2006.  7. Left knee replacement, February 2007.  8. Abdominal hysterectomy with bilateral salpingo-oophorectomy.  9. Breast biopsy.  10.Carpal tunnel surgery release.   ALLERGIES:  NONE.   MEDICATIONS:  1. Prinivil 40 mg every day.  2. Glucophage 1,000 mg b.i.d.  3. Aspirin.  4. Glipizide 10 mg every day.  5. Diclofenac.  6. Hydrochlorothiazide 25 mg every day.  7. Zocor 80 mg every day.  8. Norvasc 10 mg.  9. Coreg 50 mg b.i.d.   REVIEW OF SYSTEMS:  As stated in the HPI and otherwise negative for other  systems.   PHYSICAL EXAMINATION:  GENERAL:  The patient is in no distress.  VITAL SIGNS:  Blood pressure 112/72, heart rate 70 and regular, body mass  index 31.  HEENT:  Eyes unremarkable.  Pupils are equal, round, and reactive to light.  Fundi not visualized.  NECK:  No jugular venous distention.  Wave form within normal limits.  Carotid upstroke brisk and symmetric.  No bruits.  No thyromegaly.  LYMPHATICS:  No nodes.  LUNGS:  Clear to auscultation bilaterally.  BACK:  No  costovertebral angle tenderness.  CHEST:  Unremarkable.  HEART:  PMI not displaced or sustained.  S1 and S2 within normal limits.  No  S3, no S4, no murmurs.  ABDOMEN:  Obese.  Positive bowel sounds, normal in frequency and pitch.  No  bruits.  No rebound.  No guarding.  No midline pulsatile mass.  No  organomegaly.  SKIN:  No rashes.  No nodules.  EXTREMITIES:  With 2+ pulses.  No edema.   EKG:  Sinus rhythm, rate 71, axis within normal limits, intervals within  normal limits, no acute ST-T wave changes.   ASSESSMENT/PLAN:  1. Cardiomyopathy.  The patient's cardiomyopathy is mild.  She is not      having symptoms.  No further cardiovascular testing is suggested.  2. Hypertension.  Blood pressure is well controlled.  She will continue      medications as listed.  3. Insomnia.  The patient is bothered by this.  It keeps her from      exercising  because she is so tired during the day.  I took the liberty      of giving her 5 mg of Ambien p.o. q.h.s. p.r.n.  I gave her 25 pills      only with no refills.  I have asked her that if this works and she      needs refills to consult with her primary physician.   FOLLOWUP:  I will see her in 12 months or sooner if needed.                               Rollene Rotunda, MD, [facility]    JH/MedQ  DD:  04/30/2006  DT:  04/30/2006  Job #:  [id number]   cc:   [facility]

## 2011-01-23 NOTE — Procedures (Signed)
NAME:  [name], FAIR NO.:  [id number]   MEDICAL RECORD NO.:  [id number]          PATIENT TYPE:  OUT   LOCATION:  SLEEP CENTER                 FACILITY:  [facility]   PHYSICIAN:  Makayla D. Young, MD, FCCP, FACPDATE OF BIRTH:  1951/05/09   DATE OF STUDY:                            NOCTURNAL POLYSOMNOGRAM   INDICATION FOR STUDY:  Hypersomnia with sleep apnea.  Epworth sleepiness  score 10/24, BMP 33.3, weight 213 pounds.   HOSPITAL COURSE:  Listed and reviewed.  Diagnostic MPSG protocol was  requested.   SLEEP ARCHITECTURE:  Total sleep time 453 minutes with sleep efficiency  93%.  Stage I was 5%, stage II was 63%, stages III and IV 1%.  REM 27%  of total sleep time.  Sleep latency was 3 minutes.  REM latency was 52  minutes.  Awake after sleep onset 30 minutes.  Arousal index 10.2.  No  bedtime medication was taken.   RESPIRATORY DATA:  Apnea-hypopnea index (AHI, RDI) 4.2 obstructive  events per hour, which is within normal limits (normal range 0-5 per  hour).  There were 3 obstructive apneas and 29 hypopneas.  REM AHI 13.2  per hour.  Events were not positional.   OXYGEN DATA:  Moderate to loud snoring with oxygen desaturation to a  nadir of 84%.  Mean oxygen saturation through the study was 93% on room  air.   CARDIAC DATA:  Normal sinus rhythm.   MOVEMENT/PARASOMNIA:  A total of 33 limb jerks were recorded, of which 6  were associated with arousal or awakening, for a periodic limb movement  with arousal index of 0.8 per hour, which is insignificant.   IMPRESSION/RECOMMENDATION:  1. Unremarkable sleep architecture without medication.  2. Occasional sleep disordered breathing events, AHI 4.2 per hour,      which is within normal limits (normal range 0.25 per hour).  Events      were not positional.  Snoring was moderate to loud with oxygen      desaturation to a nadir of 84%.  Mean oxygen saturation was normal      at 93%.  3. Consider encouraging weight  loss and therapy to address any      significant nasal congestion.  CPAP therapy would not ordinarily be      indication for scores in this range.     Makayla D. Maple Hudson, MD, [facility], FACP  Diplomate, Biomedical engineer of Sleep Medicine  Electronically Signed    CDY/MEDQ  D:  11/07/2006 12:03:04  T:  11/07/2006 20:33:00  Job:  [id number]

## 2011-01-23 NOTE — Discharge Summary (Signed)
NAME:  [name], CARACCI            ACCOUNT NO.:  [id number]   MEDICAL RECORD NO.:  [id number]          PATIENT TYPE:  INP   LOCATION:  5008                         FACILITY:  [facility]   PHYSICIAN:  Robert A. Thurston Hole, M.D. DATE OF BIRTH:  May 02, 1951   DATE OF ADMISSION:  09/17/2004  DATE OF DISCHARGE:  09/21/2004                                 DISCHARGE SUMMARY   ADMISSION DIAGNOSIS:  1.  End stage degenerative joint disease, right knee.  2.  Hypertension.  3.  Diabetes.   DISCHARGE DIAGNOSIS:  1.  End stage degenerative joint disease, right knee, status post total knee      replacement.  2.  Hypertension.  3.  Diabetes.  4.  High cholesterol.   HISTORY OF PRESENT ILLNESS:  The patient is a 60 year old white female with  end stage DJD of both knees, her right is  more painful than her left.  She  has failed conservative care including anti-inflammatories, cortisone  injections, and shoe parts.  She understands the risks, benefits, and  possible complications of the right total knee replacement and is without  question.   HOSPITAL COURSE:  On September 17, 2004, the patient underwent right total  knee replacement by Dr. Thurston Hole.  Postoperatively, she underwent a right  femoral nerve block by anesthesia, she tolerated both procedures well.  She  was admitted postoperatively for pain control, DVT prophylaxis, and physical  therapy.  On postoperative day one, vital signs were stable, she was  afebrile.  The surgical wound was well approximated, her drain was  discontinued.  Her hemoglobin is 9.3, glucose was high at 236.  Her  potassium was 3.9.  Physical therapy was started.  She was placed on sliding  scale for insulin.  Pharmacy was consulted for Coumadin management.  Diabetes was consulted for hyperglycemia.  On postop day two, the patient  was doing well, her INR was 1.2, her hemoglobin was 8.8.  She was given a  suppository to move her bowels.  Postop day three, the patient was  tired and  struggling with physical therapy.  Her hemoglobin was 7.9, her heart rate  was rapid but regular.  She was given 2 units of packed red blood cells with  10 mg Lasix between units.  She tolerated the transfusion well.  She did  much better with physical therapy post transfusion.  On postop day four, she  was feeling better.  Her surgical wound was well approximated.  Her  hemoglobin was 10.4.  She was discharged to home in stable condition.  Weight bearing as tolerated on a regular diet on Percocet for pain 1-2 p.o.  q.4-6h. p.r.n. pain, Robaxin 500 mg 1 p.o. q. 4-6h. p.r.n. spasm, Coumadin  per pharmacy protocol to maintain her INR between 2.0 and 3, Glipizide ER 10  mg 1 p.o. daily, Toprol XL 100 mg 1  p.o. daily, Lisinopril 40 mg 1 p.o. daily, Zocor 40 mg 1 p.o. daily,  hydrochlorothiazide 25 mg 1 p.o. daily, Metformin 1000 mg p.o. b.i.d.,  aspirin 81 mg p.o. b.i.d.  She is weight bearing as tolerated on a regular  diabetic diet.  She will follow up with Dr. Thurston Hole two weeks after surgery.       KS/MEDQ  D:  11/28/2004  T:  11/28/2004  Job:  [id number]

## 2011-01-23 NOTE — Consult Note (Signed)
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  She underwent total abdominal  hysterectomy and bilateral salpingo-oophorectomy and Burch cystourethropexy  in 1995 when the primary tumor was found.  She has been followed since that  time with no evidence of recurrent disease being treated with suppressive  Depo Provera.   Since her last visit the patient has noted increasing bulging in her vagina.  She denies any stress incontinence and the bulging is only noticeable when  she is standing up at work and straining lifting boxes.  She denies any  other GI or GU symptoms and has no pelvic or abdominal pain.  Her functional  status is excellent.   SOCIAL HISTORY:  The patient has recently become married.  She does not  smoke.   PAST SURGICAL HISTORY:  Carpal tunnel release, total abdominal hysterectomy,  breast biopsies.   ALLERGIES:  None.   FAMILY HISTORY:  Negative for gynecologic, breast, or colon cancers.   REVIEW OF SYMPTOMS:  Otherwise negative.   PHYSICAL EXAMINATION:  GENERAL:  The patient is a healthy black female in no  acute distress.  HEENT:  Negative.  NECK:  Supple without thyromegaly.  LYMPH:  There is no supraclavicular or inguinal adenopathy.  ABDOMEN:  Soft and nontender.  No mass, organomegaly, ascites, or hernias  are noted.  PELVIC:  EGBUS, vagina, bladder, urethra are normal except for a second  degree cystocele and an enterocele which appears to extend down to at least  the introitus.  She has a  small rectocele.  Bimanual and rectovaginal  examination reveal no masses, induration, or nodularity.  EXTREMITIES:  Lower extremities without edema or varicosities.   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  No  evidence of recurrent disease.   The patient is reassured regarding the findings.  She will continue to  receive Depo Provera every three months and will return to see [place] in one  year.                                                 De Blanch, MD    DC/MEDQ  D:  06/21/2002  T:  06/21/2002  Job:  [id number]   cc:   Telford Nab, R.N.  [address] [place], [place] [address]  Fax: 1   Charles A. Clearance Coots, M.D.   [facility]

## 2011-01-23 NOTE — Consult Note (Signed)
NAME:  [name], [name] NO.:  [id number]   MEDICAL RECORD NO.:  [id number]          PATIENT TYPE:  OUT   LOCATION:  GYN                          FACILITY:  [facility]   PHYSICIAN:  De Blanch, M.D.DATE OF BIRTH:  September 27, 1950   DATE OF CONSULTATION:  12/03/2006  DATE OF DISCHARGE:                                 CONSULTATION   GYNECOLOGY/ONCOLOGY CLINIC   CHIEF COMPLAINT:  Endometrial stromal sarcoma.   INTERVAL HISTORY:  Since her last visit, the patient has done well.  She  denies any GI or GU symptoms.  She has no pelvic pain, pressure, vaginal  bleed or discharge.  Unfortunately, she has apparently been diagnosed  with an early breast cancer.  She has undergone breast biopsy and is  scheduled to begin radiation therapy.  The patient did not have an  axillary node dissection.   HISTORY OF PRESENT ILLNESS:  The patient has stage I, low grade  endometrial stromal sarcoma found at the time of TAH/BSO and Burch  cystourethropexy in 1995.  She had been followed since that time with no  evidence of recurrent disease.   SOCIAL HISTORY:  The patient is married.  She does not smoke.  She works  full-time on night shift.   PAST SURGICAL HISTORY:  1. Carpal tunnel release.  2. Total abdominal hysterectomy with bilateral salpingo-oophorectomy.  3. Breast biopsies.  4. Right total knee replacement in 2006.   ALLERGIES:  No known drug allergies.   FAMILY HISTORY:  Negative for gynecologic, breast or colon cancer.   PAST MEDICAL HISTORY:  1. Obesity.  2. Diabetes.  3. Hypertension.  4. Osteoarthritis.   CURRENT MEDICATIONS:  Metformin, Carvedilol, glipizide, simvastatin,  amlodipine besylate, lisinopril, hydrochlorothiazide, Zetia, aspirin and  Benadryl.   REVIEW OF SYSTEMS:  A 10-point comprehensive review of systems negative  except as noted above.   PHYSICAL EXAMINATION:  VITAL SIGNS:  Weight 210 pounds.  GENERAL:  The patient is a healthy,  African-American female in no acute  distress.  HEENT:  Negative.  NECK:  Supple without thyromegaly.  There is no supraclavicular or  inguinal adenopathy.  ABDOMEN:  Soft, nontender.  No mass, organomegaly, ascites or hernias  noted.  PELVIC:  EG/BUS, vagina and urethra are normal.  Cervix and uterus  surgically absent.  Adnexa without masses.  Rectovaginal exam confirms.  EXTREMITIES:  Lower extremities without edema or varicosities.   IMPRESSION:  Low-grade endometrial stromal sarcoma in 1995.  No evidence  of recurrent disease.   PLAN:  Pap smear was obtained.  The patient will return to see [place] in 6  months.      De Blanch, M.D.  Electronically Signed     DC/MEDQ  D:  12/03/2006  T:  12/04/2006  Job:  [id number]   cc:   Telford Nab, R.N.  [address]  [place], [place] [address]   Bing Neighbors. Clearance Coots, M.D.  Fax: [phone]

## 2011-01-23 NOTE — Op Note (Signed)
NAME:  [name], [name]            ACCOUNT NO.:  [id number]   MEDICAL RECORD NO.:  [id number]          PATIENT TYPE:  INP   LOCATION:  2550                         FACILITY:  [facility]   PHYSICIAN:  Robert A. Thurston Hole, M.D. DATE OF BIRTH:  Sep 25, 1950   DATE OF PROCEDURE:  09/17/2004  DATE OF DISCHARGE:                                 OPERATIVE REPORT   PREOPERATIVE DIAGNOSIS:  Right knee degenerative joint disease.   POSTOPERATIVE DIAGNOSIS:  Right knee degenerative joint disease.   PROCEDURE:  Right total knee replacement using Osteonic Scorpio total knee  system with #9 cemented femur, #9 cemented tibia with 15 mm polyethelene  flexed tibial spacer and 20 mm polyethelene cemented patella.   SURGEON:  Elana Alm. Thurston Hole, M.D.   ASSISTANT:  Julien Girt, P.A.   ANESTHESIA:  General.   OPERATIVE TIME:  One hour and 45 minutes.   COMPLICATIONS:  None.   DESCRIPTION OF PROCEDURE:  [name] is brought to the operating room  on September 17, 2004 after a femoral nerve block had been placed in the  holding room by anesthesia for postoperative pain control.  She was placed  on the operating table in the supine position.  After being placed under  general anesthesia, she received Ancef 1 gram IV preoperatively for  prophylaxis.  She had a Foley catheter placed under sterile conditions.  Her  right knee was examined.  From from minus 8 to 125 degrees, mild varus  deformity, knee stability and ligamentous exam were normal with patellar  tracking.  The right leg was then prepped using sterile DuraPrep and draped  using sterile technique.  The leg was exsanguinated and a thigh tourniquet  elevated 375 mm.  Initially, through a 15 cm longitudinal incision based  over the patella, initial exposure was made.  The line of the subcutaneous  tissues were incised along with the skin incision.  A median arthrotomy was  performed revealing an excessive amount of normal-appearing joint  fluid.  The articular surfaces were inspected.  She had grade IV changes medially,  grade III and IV changes laterally and grade III and IV changes in the  patellofemoral joint.  Osteophytes were removed off the femoral condyles and  tibial plateau.  The intermedullary drill was then drilled up the femoral  canal for placement of the distal femoral cutting jig which was placed in  the appropriate amount of rotation and the distal 10 mm cut was made.  The  distal femur was incised.  A #9 was found to be the appropriate size.  A #9  cutting jig was placed and then these cuts were made.  After this was done,  the proximal tibia was exposed.  The tibial spines were removed with an  oscillating saw.  Each medullary drill was then drilled down the tibial  canal for placement of proximal tibial cutting jig which was placed in the  appropriate amount of rotation and the proximal 6 mm cut was made.  After  this was done, the Scorpio PCL cutter was placed back on the distal femur  and these cuts were made.  At this point, the #9 femoral trial was placed.  The #9 tibial base plate trial was placed and with a 15 mm polyethelene  flexed tibial spacer, there was found to be excellent restoration of normal  alignment, excellent stability, range of motion 0-125 degrees.  The tibial  base plate was then marked for rotation and the keel cut was made.  After  this was done, the patella was sized.  A 26 mm was found to be the  appropriate size and recessed 10 mm x 26 mm cut was made and three locking  holes were placed.  After this was done, it was felt that all the trial  components were of excellent size, fit and stability.  They were then  removed.  The knee was then jet lavaged irrigated with 3 liters of saline  solution.  The proximal tibia was then exposed and the #9 tibial base plate  with cement backing was hammered into position with an excellent fit with  excess cement being removed from around the  edges.  The #9 femoral component  with cement backing was hammered into position also with an excellent fit  with excess cement being removed from around the edges.  The 15 mm  polyethelene flexed tibial spacer was then locked on the tibial base plate.  The knee was taken through a range of motion, 0-125 degrees with excellent  stability and no lift off on the tray.  The 26 mm polyethelene cement-backed  patella was then locked into its recessed hole and held there with a clamp.  After the cement hardened, the patellofemoral tracking was evaluated and  this was found to be normal.  At this point, it was felt that all the  components were of excellent size, fit and stability.  The knee was further  irrigated with saline and then the arthrotomy was closed with #1 Ethibond  suture over two medium Hemovac drains.  The subcutaneous tissues were closed  with 0 and 2-0 Vicryl, the skin closed with skin staples.  Sterile dressings  were applied.  The Hemovac injected with 0.25% Marcaine with epinephrine and  clamp.  The tourniquet was released.  The patient then awakened, extubated  and taken to the recovery room in stable condition.  The needle and sponge  counts were correct x2 at the end of the case.       RAW/MEDQ  D:  09/17/2004  T:  09/17/2004  Job:  [id number]

## 2011-01-23 NOTE — Op Note (Signed)
NAME:  [name], [name]            ACCOUNT NO.:  [id number]   MEDICAL RECORD NO.:  [id number]           PATIENT TYPE:   LOCATION:                               FACILITY:  [facility]   PHYSICIAN:  Robert A. Thurston Hole, M.D. DATE OF BIRTH:  Nov 02, 1950   DATE OF PROCEDURE:  10/07/2005  DATE OF DISCHARGE:                                 OPERATIVE REPORT   PREOPERATIVE DIAGNOSIS:  Left knee degenerative joint disease.   POSTOPERATIVE DIAGNOSIS:  Left knee degenerative joint disease.   PROCEDURE:  Left total knee replacement using DePuy cemented total knee  system with #4 cemented femur, #4 cemented tibia, with a 17.5-mm  polyethylene RP-tibial spacer, and 35-mm polyethylene cemented patella.   SURGEON:  Elana Alm. Thurston Hole, M.D.   ASSISTANT:  Julien Girt, P.A.   ANESTHESIA:  General.   OPERATIVE TIME:  1 hour 45 minutes.   COMPLICATIONS:  None.   DESCRIPTION OF PROCEDURE:  [name] was brought to operating room on  October 07, 2005 after a femoral nerve block had been placed in the holding  room by anesthesia. She was placed on the operating table in the supine  position. She received Ancef 1 gram IV preoperatively for prophylaxis. After  being placed under general anesthesia, she had a Foley catheter placed under  sterile conditions. Her left knee was examined. Range of motion from minus  10 to 120 degrees with moderate varus deformity; and knee stable ligamentous  exam with normal patellar tracking. The left leg was prepped using sterile  DuraPrep and draped using sterile technique. Leg was then exsanguinated and  a tourniquet elevated to 365 mm. Initially through a 15-cm longitudinal  incision, based over the patella, initial exposure was made.   The underlying subcutaneous tissues were incised along with a skin incision.  A median arthrotomy was performed revealing an excessive amount of normal-  appearing joint fluid. The articular surfaces were inspected. She had grade  4 changes medially, laterally, and in the patellofemoral joint. Osteophytes  were removed from the femoral condyles and tibial plateau. Medial and  lateral meniscal remnants were removed as well as the anterior cruciate  ligament. Intramedullary drill was then drilled up the femoral canal for  placement of the distal femoral cutting jig which was placed in the  appropriate amount of rotation and a distal 11-mm cut was made. The distal  femur was incised.  A #4 was found to be the appropriate size. A #4 cutting  jig was placed and then these cuts were made.   After this was done, then the proximal tibia was exposed. The tibial spines  were removed with an oscillating saw and intramedullary drill was drilled  down the tibial canal for placement of proximal tibial cutting jig which was  placed in the appropriate amount of rotation and a proximal 8 mm cut was  made based off the medial or lower side. After this was done, a spacer block  was placed in flexion and extension. A 17.5-mm block gave excellent  stability and balancing in both flexion and extension; and corrected her  flexion and varus deformities.  After this was done, a #4 tibial base trial  was placed on the proximal tibia and the keel cut was made. After this was  done, then the PCL box cutter was placed on the distal femur and then these  cuts were made.   At this point the #4 femoral trial was placed, a #4 tibial base plate trial  was placed in, with a 17.5 polyethylene tibial spacer. The knee was reduced,  taken through a range of motion from 0-125 degrees with excellent stability  and excellent correction of her flexion and varus deformities. The patella  was incised and a resurfacing 8-mm cut was made; and three locking holes  were placed for a 35-mm polyethylene patellar trial which was placed.  Patellofemoral tracking was then evaluated; and this was found be normal. At  this point, it was felt that all the trial components  were of excellent size  and good stability. They were then removed; and the knee was then jet lavage  irrigated with 3 liters of saline solution. The proximal tibia was then  exposed and the #4 tibial baseplate with cement backing was hammered into  position with an excellent fit with excess cement being removed from around  the edges. The #4 femoral component with cement backing was hammered into  position; also with an excellent fit with excess cement being removed around  the edges. The 17.5 mm polyethylene RP-tibial spacer was then placed on the  tibial baseplate; the knee reduced, and taken through a range of motion from  0-125 degrees with excellent stability and excellent correction of her  flexion and varus deformities. A 35-mm polyethylene cement backed patella  was then placed in its position and held there with a clamp. After the  cement hardened, patellofemoral tracking was evaluated and this was found be  normal.   At this point, it was felt that all the components were excellent size, fit,  and stability. The knee was further irrigated with saline; and then the  tourniquet was released. Hemostasis was obtained with cautery. The  arthrotomy was then closed with #1 Ethibond suture over two medium Hemovac  drains. Subcutaneous tissues were closed with 0 and 2-0 Vicryl. Skin closed  with Monocryl. Steri-Strips were applied. Sterile dressings were applied.  The Hemovac injected with 0.2 mL of 1/2% Marcaine with epinephrine and 4 mg  of morphine. Sterile dressings were applied; then a long-leg splint; and  then the patient awakened, and taken to recovery in stable condition. Needle  and sponge counts correct x2 at the end of the case.      Robert A. Thurston Hole, M.D.  Electronically Signed     RAW/MEDQ  D:  10/07/2005  T:  10/07/2005  Job:  [id number]

## 2011-01-26 ENCOUNTER — Ambulatory Visit (INDEPENDENT_AMBULATORY_CARE_PROVIDER_SITE_OTHER): Payer: Managed Care, Other (non HMO) | Admitting: Family Medicine

## 2011-01-26 ENCOUNTER — Encounter: Payer: Self-pay | Admitting: Family Medicine

## 2011-01-26 DIAGNOSIS — E7800 Pure hypercholesterolemia, unspecified: Secondary | ICD-10-CM

## 2011-01-26 DIAGNOSIS — E119 Type 2 diabetes mellitus without complications: Secondary | ICD-10-CM

## 2011-01-26 DIAGNOSIS — I10 Essential (primary) hypertension: Secondary | ICD-10-CM

## 2011-01-26 LAB — POCT GLYCOSYLATED HEMOGLOBIN (HGB A1C): Hemoglobin A1C: 9.3

## 2011-01-26 MED ORDER — TRAMADOL HCL 50 MG PO TABS
50.0000 mg | ORAL_TABLET | Freq: Two times a day (BID) | ORAL | Status: DC | PRN
Start: 2011-01-26 — End: 2013-04-29

## 2011-01-26 NOTE — Assessment & Plan Note (Signed)
Not at goal. Continue medications. Advised to take medications regularly. Advised regarding DASH diet. Follow up in three months. Would adjust regimen if still not at goal.

## 2011-01-26 NOTE — Assessment & Plan Note (Signed)
Direct LDL today. Consider stop Zetia based on results.

## 2011-01-26 NOTE — Patient Instructions (Signed)
It was great to see you today! You will have a new doctor after June 30 Follow up in three months. Get Dr. Stanford Breed and Dr. Tamela Oddi to send me their records.

## 2011-01-26 NOTE — Assessment & Plan Note (Signed)
A1C virtually unchanged. Patient does not wish to start insulin therapy. Advised on importance of continued dietary and activity change. Follow up in three months. Consider adjust regimen at that time.

## 2011-01-26 NOTE — Progress Notes (Signed)
  Subjective:    Patient ID: [name], female    DOB: [birth year], 60 y.o.   MRN: [id number]  HPI  1) DM2: Has improved eating habits - avoiding all fried foods, red meat, has increased vegetables. Still has skipped meals due to work schedule (works nights). A1C today 11.0 in September 2010, 9.8 in March 2011, 9.2 in September, 9.4 January, 9.3 today.  Not checking sugars because meter is broken - has not gotten replacement as we discussed. Referred to Pharmacy clinic regarding starting insulin but she did not follow up as she decided she did not want to start on insulin therapy. Still eats two meals per day many days, but has been trying to get three meals in since our last meeting. States that she is taking all of her medications as directed. Checks feet occasionally - notes some calluses on her 5th toes.   2) HTN: BP 150/90 today (did not take medications since 5AM yesterday morning).  Denies headache, LE edema, chest pain, dyspnea. Has increased her activity with walking   3) HLD: Last LDL direct 83 in Sept 2011.  Trying to eat better with more vegetables but still having difficulty due to work schedule. Patient would like to stop Zetia due to cost.    Pertinent past medical history reviewed.   Review of Systems As per HPI    Objective:   Physical Exam  Constitutional: She appears well-developed and well-nourished. No distress.  Neck: No JVD present.  Cardiovascular: Normal rate, regular rhythm, normal heart sounds and intact distal pulses.   No murmur heard. Pulmonary/Chest: Effort normal and breath sounds normal. She has no wheezes.          Assessment & Plan:

## 2011-01-28 ENCOUNTER — Telehealth: Payer: Self-pay | Admitting: Family Medicine

## 2011-01-28 NOTE — Telephone Encounter (Signed)
Called patient to let her know that her LDL was 75 and that we could have her stop her Zetia (given that she is unable to afford it) for now. Left message.

## 2011-03-24 ENCOUNTER — Telehealth: Payer: Self-pay | Admitting: Family Medicine

## 2011-03-24 NOTE — Telephone Encounter (Signed)
Called [facility] reguarding fax I received.  Fax reports patient has gained about 10 lbs in the past week.  It says she is fatigued but no shortness of breath or chest pain.  The fax also says she has been instructed to call for an appointment.  She has not called for an appointment that I can see.  I called to notify that she has not scheduled an appointment, and to see if there is anything I can do over the phone.  Left office number to call back if questions.

## 2011-05-20 ENCOUNTER — Ambulatory Visit (INDEPENDENT_AMBULATORY_CARE_PROVIDER_SITE_OTHER): Payer: Managed Care, Other (non HMO) | Admitting: Family Medicine

## 2011-05-20 ENCOUNTER — Encounter: Payer: Self-pay | Admitting: Family Medicine

## 2011-05-20 ENCOUNTER — Other Ambulatory Visit (HOSPITAL_COMMUNITY)
Admission: RE | Admit: 2011-05-20 | Discharge: 2011-05-20 | Disposition: A | Discharge status: Home / Self Care | Payer: Managed Care, Other (non HMO) | Source: Ambulatory Visit | Attending: Family Medicine | Admitting: Family Medicine

## 2011-05-20 VITALS — BP 152/87 | HR 92 | Ht 67.0 in | Wt 214.0 lb

## 2011-05-20 DIAGNOSIS — E7800 Pure hypercholesterolemia, unspecified: Secondary | ICD-10-CM

## 2011-05-20 DIAGNOSIS — R5383 Other fatigue: Secondary | ICD-10-CM

## 2011-05-20 DIAGNOSIS — E119 Type 2 diabetes mellitus without complications: Secondary | ICD-10-CM

## 2011-05-20 DIAGNOSIS — I10 Essential (primary) hypertension: Secondary | ICD-10-CM

## 2011-05-20 DIAGNOSIS — R5381 Other malaise: Secondary | ICD-10-CM

## 2011-05-20 DIAGNOSIS — Z124 Encounter for screening for malignant neoplasm of cervix: Secondary | ICD-10-CM

## 2011-05-20 DIAGNOSIS — Z01419 Encounter for gynecological examination (general) (routine) without abnormal findings: Secondary | ICD-10-CM | POA: Insufficient documentation

## 2011-05-20 LAB — BASIC METABOLIC PANEL
BUN: 30 mg/dL — ABNORMAL HIGH (ref 6–23)
Calcium: 9.6 mg/dL (ref 8.4–10.5)
Creat: 0.9 mg/dL (ref 0.50–1.10)
Glucose, Bld: 100 mg/dL — ABNORMAL HIGH (ref 70–99)

## 2011-05-20 LAB — LIPID PANEL
Cholesterol: 226 mg/dL — ABNORMAL HIGH (ref 0–200)
HDL: 82 mg/dL (ref >39)
Total CHOL/HDL Ratio: 2.8 Ratio
Triglycerides: 74 mg/dL (ref <150)
VLDL: 15 mg/dL (ref 0–40)

## 2011-05-20 LAB — POCT GLYCOSYLATED HEMOGLOBIN (HGB A1C): Hemoglobin A1C: 8.1

## 2011-05-20 LAB — TSH: TSH: 1.002 u[IU]/mL (ref 0.350–4.500)

## 2011-05-20 LAB — CBC
MCV: 92.5 fL (ref 78.0–100.0)
Platelets: 356 10*3/uL (ref 150–400)
RBC: 3.89 MIL/uL (ref 3.87–5.11)
RDW: 14.4 % (ref 11.5–15.5)
WBC: 5.7 10*3/uL (ref 4.0–10.5)

## 2011-05-20 NOTE — Assessment & Plan Note (Signed)
Poorly controlled in clinic, but pt did not take medications today and reports good control.  No changes today.

## 2011-05-20 NOTE — Assessment & Plan Note (Signed)
Pt is fasting, will check lipids today.

## 2011-05-20 NOTE — Progress Notes (Signed)
  Subjective:    Patient ID: [name], female    DOB: 10-Mar-1951, 60 y.o.   MRN: [id number]  HPI  Patient presents for her annual exam.  She complains of having some fatigue, but is also still having problems with insomnia.  She reports she only sleeps 4-5 hours a night and it has been like that for years.  She denies depressive symptoms.   Patient says that early this year she saw her Gynecologist for vaginal bleeding.  She had a hysterectomy about 15 years ago.  The Gynecologist got a CT scan, but [name] never heard the results of that scan, and she does not want to see that doctor any more.  She says she was told she needs a pap smear every year, but is not sure why she had her hysterectomy or if she still has a cervix.  She wears a pessary, and has been wearing it for about 4 years due to prolapse without difficulty.    Pt says she has not been checking her blood sugars because she does not like her new meter and does not know how it works.  She does not want to go on insulin.  She is taking her glipizide and metformin.    Patient says she checks her blood pressure at home sometimes, and it runs 130's/70's at home.  She did not take her medications today because she did not eat anything.  She knows she needs to have her full cholesterol panel checked, but has stopped taking her simvastatin as Dr. Wallene Stewart had recommended.   Review of Systems Negative except HPI.     Objective:   Physical Exam BP 152/87  Pulse 92  Ht 5\' 7"  (1.702 m)  Wt 214 lb (97.07 kg)  BMI 33.52 kg/m2 General appearance: alert, cooperative and no distress Throat: lips, mucosa, and tongue normal; teeth and gums normal Neck: no adenopathy, no carotid bruit, no JVD, supple, symmetrical, trachea midline and thyroid not enlarged, symmetric, no tenderness/mass/nodules Lungs: clear to auscultation bilaterally Breasts: s/p lumpectomy of Right breast, scar visible.  Normal appearance of left breast, no masses or  tenderness. Heart: regular rate and rhythm, S1, S2 normal, no murmur, click, rub or gallop Abdomen: soft, non-tender; bowel sounds normal; no masses,  no organomegaly Pelvic: external genitalia normal, no adnexal masses or tenderness, rectovaginal septum normal, uterus surgically absent, vagina normal without discharge and Normal appearance of vaginal cuff, cervix surgically absent. Pulses: 2+ and symmetric Neurologic: Grossly normal       Assessment & Plan:  Health Maintenance- patient up to date on mammograms s/p breast cancer, Pap done today.  Pt unsure reason for hysterectomy and unclear if pt needs to continue yearly paps.  Will try to get records.  DIABETES MELLITUS II, UNCOMPLICATED Awaiting A1c, anticipate poor control.  Will refer pt to Rx clinic for diabetes management, and for help with her meter (she does not have it with her today).    HYPERCHOLESTEROLEMIA Pt is fasting, will check lipids today.   HYPERTENSION, BENIGN SYSTEMIC Poorly controlled in clinic, but pt did not take medications today and reports good control.  No changes today.

## 2011-05-20 NOTE — Assessment & Plan Note (Signed)
Awaiting A1c, anticipate poor control.  Will refer pt to Rx clinic for diabetes management, and for help with her meter (she does not have it with her today).

## 2011-05-20 NOTE — Patient Instructions (Signed)
It was nice to meet you.  I am going to check your cholesterol, your kidney function, and your electrolytes, as well as your Hemoglobin A1c.  I will send you a letter with your lab and pap results.  I want you to make an appointment on your way out to see Dr. Raymondo Band in Pharmacy clinic.  Please bring your meter and all your medications to that appointment.  I want you to have a meter you are comfortable using, and I want you to check your blood sugar at least twice a day.  Your blood pressure today was BP: 152/87 mmHg.  Remember your goal blood pressure is about 120/80.  Please be sure to take your medication every day.    Please come back and see me in about 3 months for follow up.

## 2011-05-29 ENCOUNTER — Encounter: Payer: Self-pay | Admitting: Pharmacist

## 2011-05-29 ENCOUNTER — Ambulatory Visit (INDEPENDENT_AMBULATORY_CARE_PROVIDER_SITE_OTHER): Payer: Managed Care, Other (non HMO) | Admitting: Pharmacist

## 2011-05-29 DIAGNOSIS — I10 Essential (primary) hypertension: Secondary | ICD-10-CM

## 2011-05-29 DIAGNOSIS — E119 Type 2 diabetes mellitus without complications: Secondary | ICD-10-CM

## 2011-05-29 MED ORDER — GLUCOSE BLOOD VI STRP: ORAL_STRIP | Status: DC

## 2011-05-29 NOTE — Assessment & Plan Note (Addendum)
Diabetes of >20 yrs duration currently meeting goals for blood glucose control including with recent weight loss.  Improved control is evidenced by  Lab Results  Component Value Date   HGBA1C 8.1 05/20/2011     And limited home fasting CBG readings in target range.  New meter provided. Patient able to demonstrate proper use.  She will obtain supply form local pharmacy and also from mail order for strips (prescription written by Dr. Laurel Dimmer).  Denies hypoglycemic events.   Reports adherence with medication.  No changed to DM meds at this time given recent improvement and goal of losing additional weight.  Written patient instructions provided.  Follow up in Pharmacist Clinic Visit PRN after surgery.  Encouraged increase in activity after join replacement and continued success with dietary efforts for improved glycemic control.    TTFFC 40 minutes.

## 2011-05-29 NOTE — Assessment & Plan Note (Signed)
BP at goal on multi drug regimen.   Reviewed s/sx of orthostatic hypotension.  Patient will let us know if additional weight loss with improved BP control makes her symptomatic.   This could be possible following surgery while she is using narcotics as well during her rehab process.   Asked to to communicate sx of hypotension to our office.

## 2011-05-29 NOTE — Patient Instructions (Signed)
Continue to work on weight loss.  Continue glucotrol and metformin at this time.  Please use new blood glucose monitor two or three time daily.  Next visit with Dr. Linton Ham in 3 months.

## 2011-05-29 NOTE — Progress Notes (Signed)
  Subjective:    Patient ID: [name], female    DOB: February 12, 1951, 60 y.o.   MRN: [id number]  HPI Patient arrives in good spirits.  She states she has a surgery scheduled for October 3rd to have her knee replacement in her left knee "REDONE".   She has been working hard on her diet to lose weight AND to has been able to lose about 7 pounds over the last few weeks.    He was excited to find out that her last A1C = 8.1 was better than anticipated.  She understands that her weight loss ( mostly due to improved diet as she cannot exercise on her knee that is pending surgery revision).  She has had problems with her blood glucose meter.  She was provided a Medical sales representative approved "one Touch Meter" and educated on use.  She desires to use of her old lancet device with the new meter. .     Review of Systems     Objective:   Physical Exam        Assessment & Plan:   Diabetes of >20 yrs duration currently meeting goals for blood glucose control including with recent weight loss.  Improved control is evidenced by  Lab Results  Component Value Date   HGBA1C 8.1 05/20/2011     And limited home fasting CBG readings in target range.  New meter provided. Patient able to demonstrate proper use.  She will obtain supply form local pharmacy and also from mail order for strips (prescription written by Dr. Laurel Dimmer).  Denies hypoglycemic events.   Reports adherence with medication.  Written patient instructions provided.  Follow up in Pharmacist Clinic Visit PRN after surgery.  Encouraged increase in activity after join replacement and continued success with dietary efforts for improved glycemic control.    TTFFC 40 minutes.

## 2011-06-01 NOTE — Progress Notes (Signed)
  Subjective:    Patient ID: [name], female    DOB: May 15, 1951, 60 y.o.   MRN: [id number]  HPI Reviewed and agree with Dr. Macky Lower management.    Review of Systems     Objective:   Physical Exam        Assessment & Plan:

## 2011-06-02 ENCOUNTER — Ambulatory Visit (HOSPITAL_COMMUNITY)
Admission: RE | Admit: 2011-06-02 | Discharge: 2011-06-02 | Disposition: A | Discharge status: Home / Self Care | Payer: Managed Care, Other (non HMO) | Source: Ambulatory Visit | Attending: Orthopedic Surgery | Admitting: Orthopedic Surgery

## 2011-06-02 ENCOUNTER — Other Ambulatory Visit (HOSPITAL_COMMUNITY): Payer: Self-pay | Admitting: Orthopedic Surgery

## 2011-06-02 ENCOUNTER — Encounter (HOSPITAL_COMMUNITY)
Admission: RE | Admit: 2011-06-02 | Discharge: 2011-06-02 | Disposition: A | Discharge status: Home / Self Care | Payer: Managed Care, Other (non HMO) | Source: Ambulatory Visit | Attending: Orthopedic Surgery | Admitting: Orthopedic Surgery

## 2011-06-02 DIAGNOSIS — I10 Essential (primary) hypertension: Secondary | ICD-10-CM | POA: Insufficient documentation

## 2011-06-02 DIAGNOSIS — Z01812 Encounter for preprocedural laboratory examination: Secondary | ICD-10-CM | POA: Insufficient documentation

## 2011-06-02 DIAGNOSIS — Z01811 Encounter for preprocedural respiratory examination: Secondary | ICD-10-CM

## 2011-06-02 DIAGNOSIS — Z01810 Encounter for preprocedural cardiovascular examination: Secondary | ICD-10-CM | POA: Insufficient documentation

## 2011-06-02 DIAGNOSIS — Z01818 Encounter for other preprocedural examination: Secondary | ICD-10-CM | POA: Insufficient documentation

## 2011-06-02 LAB — URINALYSIS, ROUTINE W REFLEX MICROSCOPIC
Bilirubin Urine: NEGATIVE
Ketones, ur: NEGATIVE mg/dL
Nitrite: NEGATIVE
Urobilinogen, UA: 0.2 mg/dL (ref 0.0–1.0)

## 2011-06-02 LAB — DIFFERENTIAL
Basophils Absolute: 0 10*3/uL (ref 0.0–0.1)
Basophils Relative: 0 % (ref 0–1)
Monocytes Absolute: 0.4 10*3/uL (ref 0.1–1.0)
Neutro Abs: 3.3 10*3/uL (ref 1.7–7.7)
Neutrophils Relative %: 47 % (ref 43–77)

## 2011-06-02 LAB — BASIC METABOLIC PANEL
BUN: 29 mg/dL — ABNORMAL HIGH (ref 6–23)
Creatinine, Ser: 0.9 mg/dL (ref 0.50–1.10)
GFR calc Af Amer: >60 mL/min (ref >60)
GFR calc non Af Amer: >60 mL/min (ref >60)
Potassium: 3.6 mEq/L (ref 3.5–5.1)

## 2011-06-02 LAB — SURGICAL PCR SCREEN: MRSA, PCR: NEGATIVE

## 2011-06-02 LAB — CBC
Hemoglobin: 12.4 g/dL (ref 12.0–15.0)
MCHC: 34.2 g/dL (ref 30.0–36.0)
WBC: 7 10*3/uL (ref 4.0–10.5)

## 2011-06-02 LAB — APTT: aPTT: 25 seconds (ref 24–37)

## 2011-06-02 LAB — PROTIME-INR
INR: 0.99 (ref 0.00–1.49)
Prothrombin Time: 13.3 seconds (ref 11.6–15.2)

## 2011-06-02 LAB — URINE MICROSCOPIC-ADD ON

## 2011-06-05 ENCOUNTER — Ambulatory Visit
Admission: RE | Admit: 2011-06-05 | Discharge: 2011-06-05 | Disposition: A | Discharge status: Home / Self Care | Payer: Managed Care, Other (non HMO) | Source: Ambulatory Visit | Attending: Radiation Oncology | Admitting: Radiation Oncology

## 2011-06-08 ENCOUNTER — Telehealth: Payer: Self-pay | Admitting: Family Medicine

## 2011-06-08 DIAGNOSIS — E119 Type 2 diabetes mellitus without complications: Secondary | ICD-10-CM

## 2011-06-08 DIAGNOSIS — I10 Essential (primary) hypertension: Secondary | ICD-10-CM

## 2011-06-08 DIAGNOSIS — I428 Other cardiomyopathies: Secondary | ICD-10-CM

## 2011-06-08 DIAGNOSIS — E7800 Pure hypercholesterolemia, unspecified: Secondary | ICD-10-CM

## 2011-06-08 NOTE — Telephone Encounter (Signed)
Ms. Melder is calling because she was her early in September and she forgot to request refills on all of her medications.  She uses a mail order so she needs the refills to be printed and she will pick them up to mail to [facility].

## 2011-06-10 ENCOUNTER — Inpatient Hospital Stay (HOSPITAL_COMMUNITY): Payer: Managed Care, Other (non HMO)

## 2011-06-10 ENCOUNTER — Inpatient Hospital Stay (HOSPITAL_COMMUNITY)
Admission: RE | Admit: 2011-06-10 | Discharge: 2011-06-14 | DRG: 467 | Disposition: A | Discharge status: Home Health | Payer: Managed Care, Other (non HMO) | Source: Ambulatory Visit | Attending: Orthopedic Surgery | Admitting: Orthopedic Surgery

## 2011-06-10 DIAGNOSIS — J449 Chronic obstructive pulmonary disease, unspecified: Secondary | ICD-10-CM | POA: Diagnosis present

## 2011-06-10 DIAGNOSIS — D62 Acute posthemorrhagic anemia: Secondary | ICD-10-CM | POA: Diagnosis not present

## 2011-06-10 DIAGNOSIS — Y831 Surgical operation with implant of artificial internal device as the cause of abnormal reaction of the patient, or of later complication, without mention of misadventure at the time of the procedure: Secondary | ICD-10-CM | POA: Diagnosis present

## 2011-06-10 DIAGNOSIS — T84059A Periprosthetic osteolysis of unspecified internal prosthetic joint, initial encounter: Secondary | ICD-10-CM | POA: Diagnosis present

## 2011-06-10 DIAGNOSIS — E119 Type 2 diabetes mellitus without complications: Secondary | ICD-10-CM | POA: Diagnosis present

## 2011-06-10 DIAGNOSIS — J4489 Other specified chronic obstructive pulmonary disease: Secondary | ICD-10-CM | POA: Diagnosis present

## 2011-06-10 DIAGNOSIS — E669 Obesity, unspecified: Secondary | ICD-10-CM | POA: Diagnosis present

## 2011-06-10 DIAGNOSIS — I10 Essential (primary) hypertension: Secondary | ICD-10-CM | POA: Diagnosis present

## 2011-06-10 DIAGNOSIS — T84039A Mechanical loosening of unspecified internal prosthetic joint, initial encounter: Principal | ICD-10-CM | POA: Diagnosis present

## 2011-06-10 DIAGNOSIS — Z87891 Personal history of nicotine dependence: Secondary | ICD-10-CM

## 2011-06-10 DIAGNOSIS — M8560 Other cyst of bone, unspecified site: Secondary | ICD-10-CM | POA: Diagnosis present

## 2011-06-10 DIAGNOSIS — Z96659 Presence of unspecified artificial knee joint: Secondary | ICD-10-CM

## 2011-06-10 HISTORY — PX: TOTAL KNEE ARTHROPLASTY: SHX125

## 2011-06-10 LAB — GLUCOSE, CAPILLARY: Glucose-Capillary: 280 mg/dL — ABNORMAL HIGH (ref 70–99)

## 2011-06-11 ENCOUNTER — Encounter: Payer: Self-pay | Admitting: Family Medicine

## 2011-06-11 LAB — BASIC METABOLIC PANEL
Calcium: 7.8 mg/dL — ABNORMAL LOW (ref 8.4–10.5)
Creatinine, Ser: 0.78 mg/dL (ref 0.50–1.10)
GFR calc Af Amer: >90 mL/min (ref >90)
GFR calc non Af Amer: 88 mL/min — ABNORMAL LOW (ref >90)
Sodium: 135 mEq/L (ref 135–145)

## 2011-06-11 LAB — CBC
HCT: 20.9 % — ABNORMAL LOW (ref 36.0–46.0)
MCHC: 33.5 g/dL (ref 30.0–36.0)
Platelets: 177 10*3/uL (ref 150–400)
RDW: 14 % (ref 11.5–15.5)
WBC: 5.8 10*3/uL (ref 4.0–10.5)

## 2011-06-11 LAB — PROTIME-INR
INR: 1.2 (ref 0.00–1.49)
Prothrombin Time: 15.5 seconds — ABNORMAL HIGH (ref 11.6–15.2)

## 2011-06-11 LAB — GLUCOSE, CAPILLARY
Glucose-Capillary: 308 mg/dL — ABNORMAL HIGH (ref 70–99)
Glucose-Capillary: 212 mg/dL — ABNORMAL HIGH (ref 70–99)

## 2011-06-11 LAB — GRAM STAIN

## 2011-06-11 MED ORDER — VITAMIN B-12 1000 MCG SL SUBL: 1000.0000 ug | SUBLINGUAL_TABLET | Freq: Every day | SUBLINGUAL | Status: DC

## 2011-06-11 MED ORDER — HYDROCHLOROTHIAZIDE 25 MG PO TABS: 25.0000 mg | ORAL_TABLET | Freq: Every day | ORAL | Status: DC

## 2011-06-11 MED ORDER — SIMVASTATIN 40 MG PO TABS: 40.0000 mg | ORAL_TABLET | Freq: Every day | ORAL | Status: DC

## 2011-06-11 MED ORDER — MULTIVITAMINS PO TABS: 1.0000 | ORAL_TABLET | Freq: Every day | ORAL | Status: DC

## 2011-06-11 MED ORDER — GLIPIZIDE ER 10 MG PO TB24: 10.0000 mg | ORAL_TABLET | Freq: Every day | ORAL | Status: DC

## 2011-06-11 MED ORDER — CARVEDILOL 25 MG PO TABS: 25.0000 mg | ORAL_TABLET | Freq: Two times a day (BID) | ORAL | Status: DC

## 2011-06-11 MED ORDER — ASPIRIN 81 MG PO TABS: 81.0000 mg | ORAL_TABLET | Freq: Every day | ORAL | Status: DC

## 2011-06-11 MED ORDER — LISINOPRIL 40 MG PO TABS: 40.0000 mg | ORAL_TABLET | Freq: Every day | ORAL | Status: DC

## 2011-06-11 MED ORDER — METFORMIN HCL 1000 MG PO TABS: 1000.0000 mg | ORAL_TABLET | Freq: Two times a day (BID) | ORAL | Status: DC

## 2011-06-11 MED ORDER — AMLODIPINE BESYLATE 10 MG PO TABS: 10.0000 mg | ORAL_TABLET | Freq: Every day | ORAL | Status: DC

## 2011-06-11 NOTE — Telephone Encounter (Signed)
LVM for pt to pick up scripts.

## 2011-06-11 NOTE — Telephone Encounter (Signed)
Prescriptions printed placed at front desk, please notify pt.

## 2011-06-12 LAB — CBC
HCT: 22.8 % — ABNORMAL LOW (ref 36.0–46.0)
Hemoglobin: 7.8 g/dL — ABNORMAL LOW (ref 12.0–15.0)
MCH: 30.8 pg (ref 26.0–34.0)
MCHC: 34.2 g/dL (ref 30.0–36.0)
RDW: 13.8 % (ref 11.5–15.5)

## 2011-06-12 LAB — GLUCOSE, CAPILLARY
Glucose-Capillary: 144 mg/dL — ABNORMAL HIGH (ref 70–99)
Glucose-Capillary: 200 mg/dL — ABNORMAL HIGH (ref 70–99)

## 2011-06-13 LAB — WOUND CULTURE

## 2011-06-13 LAB — TYPE AND SCREEN: Unit division: 0

## 2011-06-13 LAB — PROTIME-INR
INR: 1.39 (ref 0.00–1.49)
Prothrombin Time: 17.3 seconds — ABNORMAL HIGH (ref 11.6–15.2)

## 2011-06-13 LAB — GLUCOSE, CAPILLARY: Glucose-Capillary: 224 mg/dL — ABNORMAL HIGH (ref 70–99)

## 2011-06-13 LAB — CBC
Hemoglobin: 8.7 g/dL — ABNORMAL LOW (ref 12.0–15.0)
MCH: 30 pg (ref 26.0–34.0)
Platelets: 162 10*3/uL (ref 150–400)
RBC: 2.9 MIL/uL — ABNORMAL LOW (ref 3.87–5.11)
WBC: 9.1 10*3/uL (ref 4.0–10.5)

## 2011-06-13 NOTE — Op Note (Signed)
[name], [name] NO.:  [id number]  MEDICAL RECORD NO.:  [id number]  LOCATION:  5030                         FACILITY:  [facility]  PHYSICIAN:  Feliberto Gottron. Turner Daniels, M.D.   DATE OF BIRTH:  October 17, 1950  DATE OF PROCEDURE:  06/10/2011 DATE OF DISCHARGE:                              OPERATIVE REPORT   PREOPERATIVE DIAGNOSIS:  Loose left total knee arthroplasty.  POSTOPERATIVE DIAGNOSIS:  Loose left total knee arthroplasty.  PROCEDURES:  Revision of left total knee arthroplasty with removal of DePuy Sigma RP primary components and revision to a 5 femur with a 35-mm module and a 16 x 115 femoral sleeve on the tibial side, a 5 MBT tray, 45-mm conical module and a 16 x 115 stem.  We used a 20 mm TC3 bearing #5.  The patella itself was not loose.  SURGEON:  Feliberto Gottron. Turner Daniels, MD  FIRST ASSISTANT:  Shirl Harris PA-C  ANESTHETIC:  General endotracheal.  ESTIMATED BLOOD LOSS:  500 mL.  FLUID REPLACEMENT:  1500 mL of crystalloid.  DRAINS PLACED:  Foley catheter, two medium Hemovacs.  URINE OUTPUT:  300 mL.  TOURNIQUET TIME:  2 hours.  INDICATIONS FOR PROCEDURE:  A 60 year old woman who had a primary left total knee done by another physician in town in 2006, developed aseptic loosening over the last year.  We have been following it on the plain radiographs.  She can see where the femoral component is rocking a little bit and now because of loosening of the component, she desires elective revision arthroplasty.  She has had aspiration.  The cultures were negative.  She is not on any fever or wound problems, but is getting progressively more painful.  On the tibial side, there are large cyst beneath the tibial implant.  It did not appear to be loose, but we were not revise that as well because of the progressive nature of the cyst.  Risks and benefits of surgery have been discussed, questions answered.  DESCRIPTION OF PROCEDURE:  The patient was identified by  armband, received preoperative IV antibiotics in the Holding Area at [facility] on left femoral nerve block, taken to operating room #8, appropriate anesthetic monitors were attached, general endotracheal anesthesia induced.  The patient in supine position.  Foley catheter inserted.  Lateral post and foot positioner applied to the table. Tourniquet applied high the left thigh.  The left lower extremity was prepped and draped in usual sterile fashion from the ankle to the tourniquet.  Time-out procedure performed.  We began the operation for the first hour, the tourniquet down, made an anterior midline incision and excised the old anterior midline scar.  The incision itself was about 20 cm in length extending from a handbreadth above the patella going over the patella 1 cm medial to and 2 cm distal to the tibial tubercle.  Small bleeders in the skin and subcutaneous tissue identified and cauterized.  Medial parapatellar arthrotomy accomplished and there were large amounts of frond-like synovium that had bits of polyethylene and cement and then once we entered the joint.  We elevated the superficial medial collateral ligament from anterior to posterior of the proximal tibia, everted the patella and flexed  the knee up and then performed a fairly radical synovectomy.  We worked our way around posteromedial stripping off the medial collateral ligament leaving the intact distally.  Once we had accomplished this, we were able to extract the bearing without any difficulty and the femoral component basically came off in our hands.  At this point, we assessed the patella and found it to be stable.  The tibia was also stable, but because of the large cyst in the medial tibial plateau that was progressive, we went ahead and broke the interface between the tibial implant and the cement using a small ACL saw and osteotomes, and extracted the tibial implant without too much difficulty.  We then  removed the rest of the cement column and hand reamed the tibia up to 16 mm to the appropriate depth for 115 stem. We did some modular broaching up to 45 module and left the broach with the trial stem in place and sized for #5 tibial implant.  At this point, we directed our attention to the distal femur.  The posterior condyles were quite deficient secondary to the osteolysis from the loose cement and polyethylene.  Large amounts of scar tissue removed posteriorly including frond-like synovium and once we had gotten down to bare bone, it was obvious we had have to use a stems with cones on the femoral side as well.  We went ahead and reamed up to 16 mm the appropriate depth for 115 stem, broach the modules up to a 34 broach and went ahead and checked our distal femur.  No cut was necessary.  We kept at the same level as the old cut.  The chamfer cutting guide was placed and once again, we really cut no bone.  We did cut some extra bone for the notch preparation.  A trial femur was then assembled using a 16 x 115 stem, 34 module, a #5 femoral component offset 2 mm anteriorly and we set the rotation for the 34 module and hammered the trial femoral component into place.  We then placed the plastic #5 spacer on the tibial broach and inserted a 17.5 bearing which is the same size we taken out and a 20 the best fit and filled this finally with a 20 and the collateral ligaments were actually intact in stabilizing with the 20-mm bearing.  At this point, all trial components were removed.  The surface were water picked, clean, dried with suction and sponges.  A quadruple batch of the DePuy HV cement was mixed at the back table.  After first assembling on the tibial side, a 5 MBT tray with a 45 module set at the correct rotation and a 16 x 115 stem on the femoral side, it was a 5 TC3 femur, a 34 module and a 16 x 115 stem.  All bony surfaces and metallic mating surface were then coated in cement and  the implants were hammered into place, the tibial side first and excess cement removed and on the femoral side, again hammered into place and excess cement removed and the rotation was along the epicondylar axis.  Once this had been accomplished, we inserted a 20-mm TC3 #5 bearing, reduced the knee, brought into full extension and stability was found to be excellent. Patella tracked with no thumb pressure.  All surfaces were once again pulse lavaged, cleaned.  Medium Hemovac drains were placed deep in the wound, which was then closed in layers with running #1 Vicryl suture and the capsule and  the quad tendon 0 and 2-0 undyed Vicryl suture and the subcutaneous tissue and staples in the skin.  A dressing of Xeroform, 4x4 dressing, sponges, Webril and an Ace wrap applied.  The patient was then awakened, extubated, and taken to the recovery room without difficulty after first letting the tourniquet down.     Feliberto Gottron. Turner Daniels, M.D.     Ovid Curd  D:  06/10/2011  T:  06/10/2011  Job:  [id number]  Electronically Signed by Gean Birchwood M.D. on 06/13/2011 01:29:39 PM

## 2011-06-14 LAB — GLUCOSE, CAPILLARY
Glucose-Capillary: 161 mg/dL — ABNORMAL HIGH (ref 70–99)
Glucose-Capillary: 170 mg/dL — ABNORMAL HIGH (ref 70–99)

## 2011-06-15 LAB — ANAEROBIC CULTURE

## 2011-06-17 LAB — CBC
HCT: 28.4 — ABNORMAL LOW
Hemoglobin: 11.6 — ABNORMAL LOW
MCHC: 33.2
MCHC: 33.5
MCHC: 33.6
MCV: 90
MCV: 90.3
MCV: 91.1
Platelets: 299
RBC: 3.82 — ABNORMAL LOW
RDW: 13.7
RDW: 13.8
WBC: 10.1
WBC: 4.2

## 2011-06-17 LAB — PROTIME-INR
INR: 1.1
INR: 1.1
INR: 1.2
Prothrombin Time: 13.6
Prothrombin Time: 14

## 2011-06-17 LAB — COMPREHENSIVE METABOLIC PANEL
ALT: 30
AST: 31
CO2: 26
Chloride: 106
Creatinine, Ser: 0.81
GFR calc Af Amer: >60
GFR calc non Af Amer: >60
Sodium: 139
Total Bilirubin: 0.3

## 2011-06-17 LAB — DIFFERENTIAL
Basophils Absolute: 0
Eosinophils Absolute: 0.2
Eosinophils Relative: 4

## 2011-06-17 LAB — URINALYSIS, ROUTINE W REFLEX MICROSCOPIC
Glucose, UA: NEGATIVE
Hgb urine dipstick: NEGATIVE
Ketones, ur: NEGATIVE
Protein, ur: NEGATIVE

## 2011-06-17 LAB — BASIC METABOLIC PANEL
BUN: 8
CO2: 26
Calcium: 8.5
Chloride: 100
Creatinine, Ser: 0.83
GFR calc Af Amer: >60
Glucose, Bld: 254 — ABNORMAL HIGH

## 2011-06-17 LAB — BODY FLUID CULTURE: Culture: NO GROWTH

## 2011-06-17 LAB — TYPE AND SCREEN: Antibody Screen: NEGATIVE

## 2011-06-17 LAB — ANAEROBIC CULTURE

## 2011-06-17 LAB — GRAM STAIN

## 2011-06-17 LAB — URINE MICROSCOPIC-ADD ON

## 2011-06-18 LAB — BASIC METABOLIC PANEL
BUN: 22
Chloride: 106
Potassium: 4.1

## 2011-06-18 LAB — TYPE AND SCREEN
ABO/RH(D): A NEG
Antibody Screen: NEGATIVE

## 2011-06-18 LAB — URINALYSIS, ROUTINE W REFLEX MICROSCOPIC
Glucose, UA: NEGATIVE
Hgb urine dipstick: NEGATIVE
Specific Gravity, Urine: 1.014
pH: 6.5

## 2011-06-18 LAB — DIFFERENTIAL
Eosinophils Absolute: 0.2
Eosinophils Relative: 4
Lymphs Abs: 1.4
Monocytes Absolute: 0.5
Monocytes Relative: 11

## 2011-06-18 LAB — URINE MICROSCOPIC-ADD ON

## 2011-06-18 LAB — CBC
HCT: 34.8 — ABNORMAL LOW
MCV: 92.7
Platelets: 343
RBC: 3.76 — ABNORMAL LOW
WBC: 4.6

## 2011-06-23 ENCOUNTER — Other Ambulatory Visit: Payer: Self-pay | Admitting: Family Medicine

## 2011-06-23 DIAGNOSIS — E7800 Pure hypercholesterolemia, unspecified: Secondary | ICD-10-CM

## 2011-06-23 DIAGNOSIS — I10 Essential (primary) hypertension: Secondary | ICD-10-CM

## 2011-06-23 DIAGNOSIS — I428 Other cardiomyopathies: Secondary | ICD-10-CM

## 2011-06-23 DIAGNOSIS — E119 Type 2 diabetes mellitus without complications: Secondary | ICD-10-CM

## 2011-06-23 MED ORDER — CARVEDILOL 25 MG PO TABS: 25.0000 mg | ORAL_TABLET | Freq: Two times a day (BID) | ORAL | Status: DC

## 2011-06-23 MED ORDER — METFORMIN HCL 1000 MG PO TABS: 1000.0000 mg | ORAL_TABLET | Freq: Two times a day (BID) | ORAL | Status: DC

## 2011-06-23 MED ORDER — AMLODIPINE BESYLATE 10 MG PO TABS: 10.0000 mg | ORAL_TABLET | Freq: Every day | ORAL | Status: DC

## 2011-06-23 MED ORDER — SIMVASTATIN 40 MG PO TABS: 40.0000 mg | ORAL_TABLET | Freq: Every day | ORAL | Status: DC

## 2011-06-23 MED ORDER — LISINOPRIL 40 MG PO TABS: 40.0000 mg | ORAL_TABLET | Freq: Every day | ORAL | Status: DC

## 2011-06-23 MED ORDER — HYDROCHLOROTHIAZIDE 25 MG PO TABS: 25.0000 mg | ORAL_TABLET | Freq: Every day | ORAL | Status: DC

## 2011-06-23 MED ORDER — GLIPIZIDE ER 10 MG PO TB24: 10.0000 mg | ORAL_TABLET | Freq: Every day | ORAL | Status: DC

## 2011-06-25 ENCOUNTER — Other Ambulatory Visit: Payer: Self-pay | Admitting: Family Medicine

## 2011-06-25 DIAGNOSIS — E119 Type 2 diabetes mellitus without complications: Secondary | ICD-10-CM

## 2011-06-25 MED ORDER — GLIPIZIDE ER 10 MG PO TB24: 20.0000 mg | ORAL_TABLET | Freq: Every day | ORAL | Status: DC

## 2011-06-29 ENCOUNTER — Other Ambulatory Visit: Payer: Self-pay | Admitting: Family Medicine

## 2011-06-29 DIAGNOSIS — E7800 Pure hypercholesterolemia, unspecified: Secondary | ICD-10-CM

## 2011-06-29 MED ORDER — SIMVASTATIN 20 MG PO TABS: 20.0000 mg | ORAL_TABLET | Freq: Every day | ORAL | Status: DC

## 2011-07-03 NOTE — Discharge Summary (Signed)
NAMEMAHALIA, [name] NO.:  [id number]  MEDICAL RECORD NO.:  [id number]  LOCATION:  5030                         FACILITY:  [facility]  PHYSICIAN:  Feliberto Gottron. Turner Daniels, M.D.   DATE OF BIRTH:  24-Dec-1950  DATE OF ADMISSION:  06/10/2011 DATE OF DISCHARGE:  06/14/2011                              DISCHARGE SUMMARY   CHIEF COMPLAINT:  Left knee pain.  HISTORY OF PRESENT ILLNESS:  This is a 60 year old lady who underwent primary left knee replacement in 2006.  She developed aseptic loosening. She has had aspiration with negative cultures.  No fever or other constitutional symptoms.  She now desires surgical revision of her knee. All risks and benefits of surgery were discussed with the patient.  PAST MEDICAL HISTORY:  Significant for diabetes and hypertension.  PAST SURGICAL HISTORY:  Significant for bilateral knee replacement, hysterectomy, and breast cancer surgery.  ALLERGIES:  She has no known drug allergies.  SOCIAL HISTORY:  She denies use of alcohol or tobacco.  OBJECTIVE:  Gross examination of the left knee demonstrates a well- healed surgical incision.  Range of motion is 5-60 degrees.  She is diffusely tender to palpation and is neurovascularly intact.  X-ray of the left knee demonstrates delamination between cement and the implant on the femoral side.  She also has cystic changes under the tibial plate.  The patella appears to be well fixed.  PREOPERATIVE LABS:  White blood cells 7.0, red blood cells 4.02, hemoglobin 12.4, hematocrit 36.3, platelets 304.  PT 13.3, INR 0.99, PTT 25.  Sodium 137, potassium 3.6, glucose 101, BUN 29, creatinine 0.90. Urinalysis was within normal limits.  HOSPITAL COURSE:  Mr. Templin was admitted to [facility] on June 10, 2011 when she underwent revision of the tibial and femoral components of her total knee.  The procedure was performed by Dr. Gean Birchwood and the patient tolerated it well.  Two Hemovac drains were  placed.  She was transferred to the floor on Lovenox and Coumadin for DVT prophylaxis.  A perioperative Foley catheter was placed.  On the first postoperative day, she was awake and alert and denied any nausea or vomiting.  Her surgical drain was removed by Dr. Turner Daniels.  On the second postoperative day, she was making slow progress with physical therapy.  She complained of fatigue, hemoglobin was 7.8, surgical site remained clean and she was transfused 1 unit of packed red blood cells.  On postoperative day 3, she reported improvement in her energy level.  Her hemoglobin had increased to 9.0 and she was making more progress with physical therapy. The following day, she was doing well and had met all of her physical therapy goals, so she was discharged home.  DISPOSITION:  The patient was discharged home on June 14, 2011.  She was weightbearing as tolerated and would return to the clinic in 10 days for x-rays and staple removal.  She would remain on Coumadin for a total of 2 weeks with a target INR of 1.5-2.0.  Home health care would manage her wound, Coumadin, and physical therapy.  FINAL DIAGNOSIS:  Loose left total knee with a secondary diagnosis of acute blood loss anemia.     Makayla Dike  Mayford Stewart, [place]   ______________________________ Feliberto Gottron. Turner Daniels, M.D.    JW/MEDQ  D:  07/01/2011  T:  07/01/2011  Job:  [id number]  Electronically Signed by Shirl Harris PA on 07/02/2011 10:48:27 AM Electronically Signed by Gean Birchwood M.D. on 07/03/2011 01:28:12 PM

## 2011-07-08 ENCOUNTER — Ambulatory Visit (INDEPENDENT_AMBULATORY_CARE_PROVIDER_SITE_OTHER): Payer: Managed Care, Other (non HMO) | Admitting: Family Medicine

## 2011-07-08 ENCOUNTER — Encounter: Payer: Self-pay | Admitting: Family Medicine

## 2011-07-08 VITALS — BP 143/85 | HR 87 | Temp 98.4°F | Ht 66.5 in | Wt 206.0 lb

## 2011-07-08 DIAGNOSIS — L299 Pruritus, unspecified: Secondary | ICD-10-CM | POA: Insufficient documentation

## 2011-07-08 LAB — URIC ACID: Uric Acid, Serum: 5.7 mg/dL (ref 2.4–7.0)

## 2011-07-08 MED ORDER — CAMPHOR-MENTHOL 0.5-0.5 % EX LOTN: TOPICAL_LOTION | CUTANEOUS | Status: AC | PRN

## 2011-07-08 MED ORDER — HYDROXYZINE HCL 25 MG PO TABS: 25.0000 mg | ORAL_TABLET | Freq: Three times a day (TID) | ORAL | Status: AC | PRN

## 2011-07-08 NOTE — Patient Instructions (Signed)
Thank you for coming in today. We will do some labs today.  Use the sarna cream as needed.  Take the hydroxyzine at night as needed for itching.  Come back in 1 week unless you get all better.  Good luck.

## 2011-07-08 NOTE — Assessment & Plan Note (Signed)
I can't see any definitive etiology. Plan to start hydroxyzine and Sarna. We'll obtain CMP and uric acid, to assess for other causes of pruritus. Follow up with PCP in one week

## 2011-07-08 NOTE — Progress Notes (Signed)
Ms. Cammon notes itching over her entire body for one week. She denies any rash. She denies any new medications or any possibility of causes itching. She's tried Benadryl which has helped some. She has been taking Vicodin but hasn't taken any in over a week.  4 weeks ago she had a knee replacement surgery. No chest pain fevers palpitations upset stomach or abdominal pain.  PMH reviewed.  ROS as above otherwise neg Medications reviewed.  Exam:  BP 143/85  Pulse 87  Temp(Src) 98.4 F (36.9 C) (Oral)  Ht 5' 6.5" (1.689 m)  Wt 206 lb (93.441 kg)  BMI 32.75 kg/m2 Gen: Well NAD, appears uncomfortable and. Itchy HEENT: EOMI,  MMM Lungs: CTABL Nl WOB Heart: RRR no MRG Abd: NABS, NT, ND Skin: No rash some excoriation noted.

## 2011-07-09 LAB — COMPLETE METABOLIC PANEL WITH GFR
AST: 18 U/L (ref 0–37)
Alkaline Phosphatase: 126 U/L — ABNORMAL HIGH (ref 39–117)
BUN: 20 mg/dL (ref 6–23)
Calcium: 9.7 mg/dL (ref 8.4–10.5)
Creat: 0.82 mg/dL (ref 0.50–1.10)
GFR, Est Non African American: 77 mL/min — ABNORMAL LOW (ref >90)
Glucose, Bld: 150 mg/dL — ABNORMAL HIGH (ref 70–99)

## 2011-07-16 ENCOUNTER — Encounter: Payer: Self-pay | Admitting: Family Medicine

## 2011-08-03 ENCOUNTER — Encounter: Payer: Self-pay | Admitting: Family Medicine

## 2011-08-03 ENCOUNTER — Ambulatory Visit (INDEPENDENT_AMBULATORY_CARE_PROVIDER_SITE_OTHER): Payer: Managed Care, Other (non HMO) | Admitting: Family Medicine

## 2011-08-03 VITALS — BP 131/78 | HR 92 | Temp 98.1°F | Ht 66.6 in | Wt 211.6 lb

## 2011-08-03 DIAGNOSIS — L299 Pruritus, unspecified: Secondary | ICD-10-CM

## 2011-08-03 MED ORDER — HYDROXYZINE HCL 25 MG PO TABS: 25.0000 mg | ORAL_TABLET | Freq: Three times a day (TID) | ORAL | Status: AC | PRN

## 2011-08-03 NOTE — Patient Instructions (Signed)
I am sorry you are still itching.  I am not sure what is causing it, but I do not think it is dangerous.  It may be dry skin.  Please keep taking the hydroxyzine as needed and using the Sarna lotion.   Also, remember to keep your showers short and use warm, not hot water.  Also, make sure all your lotions, detergents and soaps are dye and fragrance free.

## 2011-08-03 NOTE — Assessment & Plan Note (Signed)
Again, no definite cause seen on exam.  No anaphylaxis symptoms, no rash.  Labs normal last time.  I am concerned this may be an eczematous picture.  Discussed proper skin care for drys skin, see patient instructions.  Continue Sarna and hydroxyzine as needed.

## 2011-08-03 NOTE — Progress Notes (Signed)
  Subjective:    Patient ID: [name], female    DOB: 11/17/1950, 60 y.o.   MRN: [id number]  HPI  Ms. Mertz comes in for follow up of her itching all over. It has been going on for several weeks.  It started about two weeks after her total knee replacement.  She saw Dr. Denyse Amass about this, and he started Hydroxyzine and Sarna.  She says she only takes the hydroxyzine once a day instead of three times a day.  She puts the lotion on twice a day.  This does help, but she feels like the itching should have gone away by now.   She cannot think of any new soaps, detergents, lotions, foods, etc.  She does not have a history of eczema.    She did have percocet after her knee surgery, but has not taken it in more than a week, and is still itching.  She has no other new medications. She denies any wheezing, shortness of breath, mouth or throat swelling.  She denies any rash.   Review of Systems Pertinent items noted in HPI.     Objective:   Physical Exam  BP 131/78  Pulse 92  Temp(Src) 98.1 F (36.7 C) (Oral)  Ht 5' 6.6" (1.692 m)  Wt 211 lb 9 oz (95.964 kg)  BMI 33.53 kg/m2 General appearance: alert, cooperative and no distress Eyes: conjunctivae/corneas clear. PERRL, EOM's intact. Fundi benign. Ears: normal TM's and external ear canals both ears Nose: Nares normal. Septum midline. Mucosa normal. No drainage or sinus tenderness. Throat: lips, mucosa, and tongue normal; teeth and gums normal Neck: no adenopathy, supple, symmetrical, trachea midline and thyroid not enlarged, symmetric, no tenderness/mass/nodules Lungs: clear to auscultation bilaterally Heart: regular rate and rhythm, S1, S2 normal, no murmur, click, rub or gallop Skin: Skin color, texture, turgor normal. No rashes or lesions or Skin is mildly dry.       Assessment & Plan:

## 2011-11-17 ENCOUNTER — Other Ambulatory Visit: Payer: Self-pay | Admitting: Internal Medicine

## 2011-11-17 DIAGNOSIS — Z853 Personal history of malignant neoplasm of breast: Secondary | ICD-10-CM

## 2011-11-25 ENCOUNTER — Ambulatory Visit
Admission: RE | Admit: 2011-11-25 | Discharge: 2011-11-25 | Disposition: A | Discharge status: Home / Self Care | Payer: Managed Care, Other (non HMO) | Source: Ambulatory Visit | Attending: Internal Medicine | Admitting: Internal Medicine

## 2011-11-25 DIAGNOSIS — Z853 Personal history of malignant neoplasm of breast: Secondary | ICD-10-CM

## 2011-12-16 ENCOUNTER — Other Ambulatory Visit: Payer: Self-pay | Admitting: *Deleted

## 2011-12-28 ENCOUNTER — Encounter: Payer: Self-pay | Admitting: *Deleted

## 2011-12-28 DIAGNOSIS — M1990 Unspecified osteoarthritis, unspecified site: Secondary | ICD-10-CM | POA: Insufficient documentation

## 2011-12-28 DIAGNOSIS — C50919 Malignant neoplasm of unspecified site of unspecified female breast: Secondary | ICD-10-CM | POA: Insufficient documentation

## 2011-12-28 DIAGNOSIS — Z923 Personal history of irradiation: Secondary | ICD-10-CM | POA: Insufficient documentation

## 2011-12-28 NOTE — Progress Notes (Signed)
Married, works for [employer]

## 2012-01-01 ENCOUNTER — Ambulatory Visit
Admission: RE | Admit: 2012-01-01 | Discharge: 2012-01-01 | Disposition: A | Discharge status: Home / Self Care | Payer: Managed Care, Other (non HMO) | Source: Ambulatory Visit | Attending: Radiation Oncology | Admitting: Radiation Oncology

## 2012-01-01 ENCOUNTER — Encounter: Payer: Self-pay | Admitting: Radiation Oncology

## 2012-01-01 VITALS — BP 149/83 | HR 93 | Temp 97.3°F | Resp 20 | Wt 215.3 lb

## 2012-01-01 DIAGNOSIS — C50419 Malignant neoplasm of upper-outer quadrant of unspecified female breast: Secondary | ICD-10-CM

## 2012-01-01 DIAGNOSIS — Z853 Personal history of malignant neoplasm of breast: Secondary | ICD-10-CM | POA: Insufficient documentation

## 2012-01-01 NOTE — Progress Notes (Signed)
Pt had left knee replacement 06/10/11. States no problems w/right breast.

## 2012-01-01 NOTE — Progress Notes (Signed)
Radiation Oncology         ([phone]) [phone] ________________________________  Name: [name] MRN: [id number]  Date: 01/01/2012  DOB: [birth year]  Follow-Up Visit Note  CC: Oneal Grout, MD, MD  Bobby Rumpf, MD Harriette Bouillon M.D.   Diagnosis:   DCIS of the right breast  Interval Since Last Radiation:  Patient completed treatment in 2008   Narrative:  The patient returns today for routine follow-up.  She indicates that she has done well. The patient did have a left knee replacement last October. This was her fourth one and she feels that she is done well with this. She denies any new symptoms in the breast area. No noted masses no nipple changes or discharge. The patient had a mammogram in March of 2013 with benign findings.                              ALLERGIES:  is allergic to hydrocodone.  Meds: Current Outpatient Prescriptions  Medication Sig Dispense Refill  . amLODipine (NORVASC) 10 MG tablet Take 1 tablet (10 mg total) by mouth daily.  90 tablet  3  . aspirin 81 MG tablet Take 1 tablet (81 mg total) by mouth daily.  30 tablet  11  . Blood Glucose Monitoring Suppl (ONE TOUCH TEST STRIP HOLDER) MISC as directed.        . camphor-menthol (SARNA) lotion Apply topically as needed for itching.  222 mL  0  . carvedilol (COREG) 25 MG tablet Take 1 tablet (25 mg total) by mouth 2 (two) times daily with a meal.  180 tablet  3  . Cyanocobalamin (VITAMIN B-12) 1000 MCG SUBL Place 1 tablet (1,000 mcg total) under the tongue daily.  30 each  11  . glipiZIDE (GLUCOTROL XL) 10 MG 24 hr tablet Take 2 tablets (20 mg total) by mouth daily.  180 tablet  3  . glucose blood (ONE TOUCH TEST STRIPS) test strip Use as instructed- supply enough for twice daily testing.  100 each  12  . hydrochlorothiazide (HYDRODIURIL) 25 MG tablet Take 1 tablet (25 mg total) by mouth daily.  90 tablet  3  . metFORMIN (GLUCOPHAGE) 1000 MG tablet Take 1 tablet (1,000 mg total) by mouth 2 (two) times daily with a  meal.  180 tablet  3  . multivitamin (THERAGRAN) per tablet Take 1 tablet by mouth daily.  30 tablet  11  . simvastatin (ZOCOR) 20 MG tablet Take 1 tablet (20 mg total) by mouth at bedtime.  90 tablet  3  . traMADol (ULTRAM) 50 MG tablet Take 1 tablet (50 mg total) by mouth 2 (two) times daily as needed for pain.  60 tablet  3    Physical Findings: The patient is in no acute distress. Patient is alert and oriented.  weight is 215 lb 4.8 oz (97.659 kg). Her oral temperature is 97.3 F (36.3 C). Her blood pressure is 149/83 and her pulse is 93. Her respiration is 20. [name] Kitchen   General: Well-developed, in no acute distress HEENT: Normocephalic, atraumatic; oral cavity clear Neck: Supple without any lymphadenopathy Cardiovascular: Regular rate and rhythm Respiratory: Clear to auscultation bilaterally GI: Soft, nontender, normal bowel sounds Extremities: No edema present Neuro: No focal deficits Breast exam: Postoperative changes are present within the lateral aspect of the right breast. No suspicious nodules. No nipple changes. No axillary adenopathy. No suspicious findings on the left within the breast or axilla  Lab Findings: Lab Results  Component Value Date   WBC 9.1 06/13/2011   HGB 8.7* 06/13/2011   HCT 25.8* 06/13/2011   MCV 89.0 06/13/2011   PLT 162 06/13/2011     Radiographic Findings: No results found.  Impression:    Pleasant 61 year old female with a history of right-sided DCIS  Plan:  She is doing well and remains clinically NED. The patient's mammogram in March looked fine and she is due for repeat mammogram in March of 2014. I scheduled her to return to our clinic in 6 months for ongoing followup. I believe that we could see her once again after her next mammogram and then release her from our clinic if all looks good.  I spent 10 minutes with the patient today, the majority of which was spent counseling the patient on the diagnosis of cancer and coordinating care.   Radene Gunning, M.D., Ph.D.

## 2012-06-30 ENCOUNTER — Encounter: Payer: Self-pay | Admitting: Radiation Oncology

## 2012-06-30 ENCOUNTER — Ambulatory Visit
Admission: RE | Admit: 2012-06-30 | Discharge: 2012-06-30 | Disposition: A | Discharge status: Home / Self Care | Payer: Managed Care, Other (non HMO) | Source: Ambulatory Visit | Attending: Radiation Oncology | Admitting: Radiation Oncology

## 2012-06-30 VITALS — BP 148/81 | HR 91 | Temp 97.6°F | Resp 20 | Wt 202.7 lb

## 2012-06-30 DIAGNOSIS — C50419 Malignant neoplasm of upper-outer quadrant of unspecified female breast: Secondary | ICD-10-CM

## 2012-06-30 NOTE — Progress Notes (Signed)
Patient here f/u right breast rad txs: 04/04/07-05/20/07 No c/o pain, alert,oriented x3, slightly depressed still loss of spouse back August 10/2013meds unchanged, last mammogram March 2013 10:25 AM

## 2012-07-01 ENCOUNTER — Ambulatory Visit: Payer: Managed Care, Other (non HMO) | Admitting: Radiation Oncology

## 2012-07-01 NOTE — Progress Notes (Signed)
Radiation Oncology         [phone]) [phone] ________________________________  Name: [name] MRN: [id number]  Date: 06/30/2012  DOB: Nov 26, 1950  Follow-Up Visit Note  CC: Oneal Grout, MD  Oneal Grout, MD  Diagnosis:   DCIS of the right breast  Interval Since Last Radiation:  Patient completed treatment in 2008   Narrative:  The patient returns today for routine follow-up.  She states that she is done very well since she was last seen. She denies any new symptoms in the breast area. She has not had any mammogram since her last visit but is scheduled for her next one in March of 2014.                              ALLERGIES:  is allergic to hydrocodone.  Meds: Current Outpatient Prescriptions  Medication Sig Dispense Refill  . amLODipine (NORVASC) 10 MG tablet Take 1 tablet (10 mg total) by mouth daily.  90 tablet  3  . aspirin 81 MG tablet Take 1 tablet (81 mg total) by mouth daily.  30 tablet  11  . Blood Glucose Monitoring Suppl (ONE TOUCH TEST STRIP HOLDER) MISC as directed.        . camphor-menthol (SARNA) lotion Apply topically as needed for itching.  222 mL  0  . carvedilol (COREG) 25 MG tablet Take 1 tablet (25 mg total) by mouth 2 (two) times daily with a meal.  180 tablet  3  . Cyanocobalamin (VITAMIN B-12) 1000 MCG SUBL Place 1 tablet (1,000 mcg total) under the tongue daily.  30 each  11  . glipiZIDE (GLUCOTROL XL) 10 MG 24 hr tablet Take 2 tablets (20 mg total) by mouth daily.  180 tablet  3  . hydrochlorothiazide (HYDRODIURIL) 25 MG tablet Take 1 tablet (25 mg total) by mouth daily.  90 tablet  3  . metFORMIN (GLUCOPHAGE) 1000 MG tablet Take 1 tablet (1,000 mg total) by mouth 2 (two) times daily with a meal.  180 tablet  3  . multivitamin (THERAGRAN) per tablet Take 1 tablet by mouth daily.  30 tablet  11  . simvastatin (ZOCOR) 20 MG tablet Take 1 tablet (20 mg total) by mouth at bedtime.  90 tablet  3  . traMADol (ULTRAM) 50 MG tablet Take 1 tablet (50 mg  total) by mouth 2 (two) times daily as needed for pain.  60 tablet  3  . glucose blood (ONE TOUCH TEST STRIPS) test strip Use as instructed- supply enough for twice daily testing.  100 each  12    Physical Findings: The patient is in no acute distress. Patient is alert and oriented.  weight is 202 lb 11.2 oz (91.944 kg). Her oral temperature is 97.6 F (36.4 C). Her blood pressure is 148/81 and her pulse is 91. Her respiration is 20. .   No suspicious findings within the right breast region. Continue dimpling in the lateral aspect. No axillary adenopathy. General: Well-developed, in no acute distress HEENT: Normocephalic, atraumatic Cardiovascular: Regular rate and rhythm Respiratory: Clear to auscultation bilaterally GI: Soft, nontender, normal bowel sounds Extremities: No edema present   Lab Findings: Lab Results  Component Value Date   WBC 9.1 06/13/2011   HGB 8.7* 06/13/2011   HCT 25.8* 06/13/2011   MCV 89.0 06/13/2011   PLT 162 06/13/2011     Radiographic Findings: No results found.  Impression:    The patient is a  61 year old female who remains clinically NED for DCIS of the right breast.  Plan:  She will undergo a mammogram in March of 2014 and will followup in our clinic in 6 months.  I spent 10 minutes with the patient today, the majority of which was spent counseling the patient on the diagnosis of cancer and coordinating care.   Radene Gunning, M.D., Ph.D.

## 2012-10-04 ENCOUNTER — Other Ambulatory Visit: Payer: Self-pay | Admitting: Internal Medicine

## 2012-10-04 DIAGNOSIS — R1011 Right upper quadrant pain: Secondary | ICD-10-CM

## 2012-10-05 ENCOUNTER — Ambulatory Visit
Admission: RE | Admit: 2012-10-05 | Discharge: 2012-10-05 | Disposition: A | Discharge status: Home / Self Care | Payer: Managed Care, Other (non HMO) | Source: Ambulatory Visit | Attending: Internal Medicine | Admitting: Internal Medicine

## 2012-10-05 DIAGNOSIS — R1011 Right upper quadrant pain: Secondary | ICD-10-CM

## 2012-11-01 ENCOUNTER — Other Ambulatory Visit: Payer: Self-pay

## 2012-11-01 ENCOUNTER — Other Ambulatory Visit: Payer: Self-pay | Admitting: Internal Medicine

## 2012-11-01 DIAGNOSIS — Z853 Personal history of malignant neoplasm of breast: Secondary | ICD-10-CM

## 2012-11-25 ENCOUNTER — Ambulatory Visit
Admission: RE | Admit: 2012-11-25 | Discharge: 2012-11-25 | Disposition: A | Discharge status: Home / Self Care | Payer: Managed Care, Other (non HMO) | Source: Ambulatory Visit | Attending: Internal Medicine | Admitting: Internal Medicine

## 2012-11-25 DIAGNOSIS — Z853 Personal history of malignant neoplasm of breast: Secondary | ICD-10-CM

## 2012-12-01 ENCOUNTER — Other Ambulatory Visit: Payer: Self-pay | Admitting: *Deleted

## 2012-12-01 DIAGNOSIS — I10 Essential (primary) hypertension: Secondary | ICD-10-CM

## 2012-12-01 DIAGNOSIS — E1365 Other specified diabetes mellitus with hyperglycemia: Secondary | ICD-10-CM

## 2012-12-01 DIAGNOSIS — E785 Hyperlipidemia, unspecified: Secondary | ICD-10-CM

## 2012-12-12 ENCOUNTER — Other Ambulatory Visit: Payer: Managed Care, Other (non HMO)

## 2012-12-12 DIAGNOSIS — E785 Hyperlipidemia, unspecified: Secondary | ICD-10-CM

## 2012-12-12 DIAGNOSIS — I10 Essential (primary) hypertension: Secondary | ICD-10-CM

## 2012-12-12 DIAGNOSIS — E138 Other specified diabetes mellitus with unspecified complications: Secondary | ICD-10-CM

## 2012-12-13 LAB — HEMOGLOBIN A1C
Est. average glucose Bld gHb Est-mCnc: 160 mg/dL
Hgb A1c MFr Bld: 7.2 % — ABNORMAL HIGH (ref 4.8–5.6)

## 2012-12-13 LAB — CBC WITH DIFFERENTIAL/PLATELET
Basos: 0 % (ref 0–3)
Hemoglobin: 11.5 g/dL (ref 11.1–15.9)
Immature Grans (Abs): 0 10*3/uL (ref 0.0–0.1)
Lymphs: 39 % (ref 14–46)
Monocytes: 7 % (ref 4–12)
Neutrophils Absolute: 3.7 10*3/uL (ref 1.4–7.0)
Neutrophils Relative %: 52 % (ref 40–74)
RBC: 3.74 x10E6/uL — ABNORMAL LOW (ref 3.77–5.28)
WBC: 7.2 10*3/uL (ref 3.4–10.8)

## 2012-12-13 LAB — COMPREHENSIVE METABOLIC PANEL
ALT: 26 IU/L (ref 0–32)
AST: 30 IU/L (ref 0–40)
CO2: 22 mmol/L (ref 19–28)
Calcium: 9.4 mg/dL (ref 8.6–10.2)
Chloride: 99 mmol/L (ref 97–108)
Creatinine, Ser: 1.52 mg/dL — ABNORMAL HIGH (ref 0.57–1.00)
Potassium: 3.5 mmol/L (ref 3.5–5.2)
Sodium: 140 mmol/L (ref 134–144)

## 2012-12-14 ENCOUNTER — Ambulatory Visit (INDEPENDENT_AMBULATORY_CARE_PROVIDER_SITE_OTHER): Payer: Managed Care, Other (non HMO) | Admitting: Internal Medicine

## 2012-12-14 ENCOUNTER — Encounter: Payer: Self-pay | Admitting: Internal Medicine

## 2012-12-14 VITALS — BP 142/86 | HR 61 | Temp 97.8°F | Resp 16 | Ht 67.0 in | Wt 194.6 lb

## 2012-12-14 DIAGNOSIS — E0821 Diabetes mellitus due to underlying condition with diabetic nephropathy: Secondary | ICD-10-CM

## 2012-12-14 DIAGNOSIS — R141 Gas pain: Secondary | ICD-10-CM

## 2012-12-14 DIAGNOSIS — R634 Abnormal weight loss: Secondary | ICD-10-CM

## 2012-12-14 DIAGNOSIS — E1329 Other specified diabetes mellitus with other diabetic kidney complication: Secondary | ICD-10-CM

## 2012-12-14 DIAGNOSIS — K3184 Gastroparesis: Secondary | ICD-10-CM | POA: Insufficient documentation

## 2012-12-14 DIAGNOSIS — I10 Essential (primary) hypertension: Secondary | ICD-10-CM

## 2012-12-14 DIAGNOSIS — Z1211 Encounter for screening for malignant neoplasm of colon: Secondary | ICD-10-CM

## 2012-12-14 DIAGNOSIS — N058 Unspecified nephritic syndrome with other morphologic changes: Secondary | ICD-10-CM

## 2012-12-14 DIAGNOSIS — R142 Eructation: Secondary | ICD-10-CM

## 2012-12-14 DIAGNOSIS — R143 Flatulence: Secondary | ICD-10-CM

## 2012-12-14 DIAGNOSIS — N289 Disorder of kidney and ureter, unspecified: Secondary | ICD-10-CM

## 2012-12-14 DIAGNOSIS — I4892 Unspecified atrial flutter: Secondary | ICD-10-CM

## 2012-12-14 LAB — SPECIMEN STATUS REPORT

## 2012-12-14 MED ORDER — LISINOPRIL 5 MG PO TABS: 5.0000 mg | ORAL_TABLET | Freq: Every day | ORAL | Status: DC

## 2012-12-14 MED ORDER — SIMETHICONE 80 MG PO CHEW: 80.0000 mg | CHEWABLE_TABLET | Freq: Four times a day (QID) | ORAL | Status: DC | PRN

## 2012-12-14 NOTE — Assessment & Plan Note (Addendum)
New symptoms and is associated with weight loss and early satiety. Had nausea and vomiting for few weeks as well. Will provide gi referral for EGD to rule out gastric mass/ malignancy vs gastritis/ ulcer. Will provide simethicone prn for gas. Will also check h.pylori ig G ab for now

## 2012-12-14 NOTE — Patient Instructions (Signed)
Keep your appointment with gastroenterology and cardiology Get blood work prior to your next visit Continue blood pressure and sugar check

## 2012-12-14 NOTE — Assessment & Plan Note (Signed)
Reviewed home bp reading- average is 126/71- SBP 100-136 and DBP 68-80. SBP slightly elevated this office visit- working overnight and rushing for her appointment could be contributing to this. Continue current dose of amlodipine, carvedilol and hctz and continue bp monitor at home.

## 2012-12-14 NOTE — Assessment & Plan Note (Signed)
Symptoms under control at present. Pt off metoclopramide.

## 2012-12-14 NOTE — Assessment & Plan Note (Signed)
Last colonoscopy in 2007 showed diverticulae but otherwise normal and repeat recommended in 10 years. Will provide fobt card today.

## 2012-12-14 NOTE — Assessment & Plan Note (Addendum)
Recent a1c is 7.2 and is trending down. cbg at home 70-200 with 2 readings of 59 and 60 Continue metformin 1000 mg bid and glipizide 20 mg daily. No hypoglycemic episodes- warned about hypoglycemic episodes. Normal foot exam. Due for her eye exam. Urine microalbumin in September was normal but creatinine clearance has decreased in recent lab. Will add lisinopril 5 mg daily for renal protection. Continue to monitor cbg. Continue asa and simvastatin Nausea and vomiting have resolved. Metoclopramide has been discontinued by pt due to resolving of symptoms

## 2012-12-14 NOTE — Assessment & Plan Note (Addendum)
New rise in creatinine and decrease in cr clearance. htn and dm (long standing) could be contributing to this. Will encourage hydration and avoid nsaids for now. Recheck bmp in 3 months. Will also add lisinopril 5 mg daily for renal protection

## 2012-12-15 DIAGNOSIS — I4892 Unspecified atrial flutter: Secondary | ICD-10-CM | POA: Insufficient documentation

## 2012-12-15 DIAGNOSIS — R634 Abnormal weight loss: Secondary | ICD-10-CM | POA: Insufficient documentation

## 2012-12-15 NOTE — Assessment & Plan Note (Signed)
New ekg changes. Has history of htn and dm. Will provide cardiology referral for echocardiogram to assess ventricular functions and atrial function and further management. Currently on coreg 25 mg bid for rate control

## 2012-12-15 NOTE — Assessment & Plan Note (Signed)
New and unintentional weight loss with early satiety. Will rule out h.pylori infection but will also probably need EGD to rule out mass/ lesion vs ulcer/ gastritis given recent nausea, vomiting and now this new symptoms. usg abdomen and lft normal

## 2012-12-15 NOTE — Progress Notes (Signed)
PCP: Oneal Grout, MD   Allergies  Allergen Reactions  . Hydrocodone Nausea And Vomiting    Chief Complaint: physical, htn, dm  HPI:  Patient here for her routine visit. She has been having early satiety and decreased appetite. She has not taken her medication this am. She works night shift and came here from work directly. She has been compliant with her medications. Her sugar readings have been well controlled at home. Reviewed her labs. a1c is improved. bp reading elevated in office today  Review of Systems:  Review of Systems - General ROS: negative for - chills, fever, hot flashes or night sweats Psychological ROS: negative Ophthalmic ROS: negative ENT ROS: negative Hematological and Lymphatic ROS: negative Endocrine ROS: negative for - breast changes, malaise/lethargy, mood swings, palpitations, polydipsia/polyuria or temperature intolerance Breast ROS: negative for breast lumps Respiratory ROS: no cough, shortness of breath, or wheezing Cardiovascular ROS: no chest pain or dyspnea on exertion Gastrointestinal ROS: no abdominal pain, change in bowel habits, or black or bloody stools Genito-Urinary ROS: no dysuria, trouble voiding, or hematuria Musculoskeletal ROS: negative for - joint pain, joint stiffness, joint swelling or muscle pain Neurological ROS: no TIA or stroke symptoms Dermatological ROS: negative  Past Medical History  Diagnosis Date  . Hypertension   . Arthritis     osteoarthritis  . Diabetes mellitus     TYPE 2  . Breast cancer 2008    right, ER/PR -  . Hx of radiation therapy 04/04/07 to 05/20/07    right breast  . Hypercholesterolemia   . CHF (congestive heart failure)   . Gastroparesis   . Nonspecific elevation of levels of transaminase or lactic acid dehydrogenase (LDH)   . Regional enteritis of unspecified site   . Restless legs syndrome (RLS)   . Vitamin deficiency   . Depression   . Anxiety   . Malignant neoplasm of breast (female),  unspecified site   . Uterine prolapse without mention of vaginal wall prolapse   . Osteoarthrosis, unspecified whether generalized or localized, unspecified site   . Lumbago   . Urinary frequency    Past Surgical History  Procedure Laterality Date  . Breast lumpectomy  11/17/06    re-excision 01/13/07  . Arthroplasty      left knee  . Wrist surgery      carpel tunnel  . Total knee arthroplasty  06/10/2011    right  . Abdominal hysterectomy  1997    TAH/BSO, ENDOMETRIAL SARCOMA   Social History:   reports that she quit smoking about 21 years ago. She does not have any smokeless tobacco history on file. She reports that she does not drink alcohol or use illicit drugs.  Family History  Problem Relation Age of Onset  . Diabetes Mother   . Diabetes Father   . Aneurysm Sister   . Arthritis Sister   . Diabetes Brother   . Heart Problems Brother     Medications: reviewed   Physical Exam:  Filed Vitals:   12/14/12 0835  BP: 142/86  Pulse: 61  Temp: 97.8 F (36.6 C)  TempSrc: Oral  Resp: 16  Height: 5\' 7"  (1.702 m)  Weight: 194 lb 9.6 oz (88.27 kg)   Adult female patient in no acute distress Moist mucus membrane, normal ear exam, no sinus tenderness, EOMI, PERRLA Normal s1,s2, no murmur normal b/l air entry, no wheeze/ rhonchi Bowel sounds present, non tender, no mass/ guarding or rigidity Skin intact, no rash Able to move all  4 extremities and normal ROM No cranial nerve deficit, normal reflexes, normal pinprick and vibration Alert and oriented x 3, normal behavior  Labs reviewed: Basic Metabolic Panel:  Recent Labs  16/10/96 0827  NA 140  K 3.5  CL 99  CO2 22  GLUCOSE 64*  BUN 27  CREATININE 1.52*  CALCIUM 9.4   Liver Function Tests:  Recent Labs  12/12/12 0827  AST 30  ALT 26  ALKPHOS 103  BILITOT 0.3  PROT 6.8   CBC:  Recent Labs  12/12/12 0827  WBC 7.2  NEUTROABS 3.7  HGB 11.5  HCT 34.6  MCV 93    EKG: Independently reviewed. New  atrial flutter noted  Assessment/Plan HYPERTENSION, BENIGN SYSTEMIC Reviewed home bp reading- average is 126/71- SBP 100-136 and DBP 68-80. SBP slightly elevated this office visit- working overnight and rushing for her appointment could be contributing to this. Continue current dose of amlodipine, carvedilol and hctz and continue bp monitor at home.  Diabetes mellitus due to underlying condition with diabetic nephropathy Recent a1c is 7.2 and is trending down. cbg at home 70-200 with 2 readings of 59 and 60 Continue metformin 1000 mg bid and glipizide 20 mg daily. No hypoglycemic episodes- warned about hypoglycemic episodes. Normal foot exam. Due for her eye exam. Urine microalbumin in September was normal but creatinine clearance has decreased in recent lab. Will add lisinopril 5 mg daily for renal protection. Continue to monitor cbg. Continue asa and simvastatin Nausea and vomiting have resolved. Metoclopramide has been discontinued by pt due to resolving of symptoms  Gastroparesis Symptoms under control at present. Pt off metoclopramide.  Special screening for malignant neoplasms, colon Last colonoscopy in 2007 showed diverticulae but otherwise normal and repeat recommended in 10 years. Will provide fobt card today.  GAS/BLOATING New symptoms and is associated with weight loss and early satiety. Had nausea and vomiting for few weeks as well. Will provide gi referral for EGD to rule out gastric mass/ malignancy vs gastritis/ ulcer. Will provide simethicone prn for gas. Will also check h.pylori ig G ab for now  Renal impairment New rise in creatinine and decrease in cr clearance. htn and dm (long standing) could be contributing to this. Will encourage hydration and avoid nsaids for now. Recheck bmp in 3 months. Will also add lisinopril 5 mg daily for renal protection  Atrial flutter New ekg changes. Has history of htn and dm. Will provide cardiology referral for echocardiogram to assess  ventricular functions and atrial function and further management. Currently on coreg 25 mg bid for rate control  Loss of weight New and unintentional weight loss with early satiety. Will rule out h.pylori infection but will also probably need EGD to rule out mass/ lesion vs ulcer/ gastritis given recent nausea, vomiting and now this new symptoms. usg abdomen and lft normal

## 2012-12-20 LAB — H. PYLORI ANTIBODY, IGG: H Pylori IgG: 6.9 U/mL — ABNORMAL HIGH (ref 0.0–0.8)

## 2012-12-21 ENCOUNTER — Ambulatory Visit (INDEPENDENT_AMBULATORY_CARE_PROVIDER_SITE_OTHER): Payer: Managed Care, Other (non HMO) | Admitting: Internal Medicine

## 2012-12-21 ENCOUNTER — Encounter: Payer: Self-pay | Admitting: Internal Medicine

## 2012-12-21 VITALS — BP 144/82 | HR 79 | Temp 98.0°F | Resp 15 | Ht 67.0 in | Wt 192.0 lb

## 2012-12-21 DIAGNOSIS — K2970 Gastritis, unspecified, without bleeding: Secondary | ICD-10-CM

## 2012-12-21 DIAGNOSIS — I10 Essential (primary) hypertension: Secondary | ICD-10-CM

## 2012-12-21 DIAGNOSIS — R768 Other specified abnormal immunological findings in serum: Secondary | ICD-10-CM

## 2012-12-21 DIAGNOSIS — R894 Abnormal immunological findings in specimens from other organs, systems and tissues: Secondary | ICD-10-CM

## 2012-12-21 DIAGNOSIS — K2990 Gastroduodenitis, unspecified, without bleeding: Secondary | ICD-10-CM

## 2012-12-21 MED ORDER — OMEPRAZOLE 20 MG PO CPDR: 20.0000 mg | DELAYED_RELEASE_CAPSULE | Freq: Every day | ORAL | Status: DC

## 2012-12-21 MED ORDER — AMOXICILLIN 500 MG PO TABS: 500.0000 mg | ORAL_TABLET | Freq: Two times a day (BID) | ORAL | Status: DC

## 2012-12-21 MED ORDER — CLARITHROMYCIN 500 MG PO TABS: 500.0000 mg | ORAL_TABLET | Freq: Two times a day (BID) | ORAL | Status: DC

## 2012-12-21 NOTE — Progress Notes (Signed)
  Subjective:    Patient ID: [name], female    DOB: [birth year], 62 y.o.   MRN: [id number]  HPI Patient here with complaints of continuing epigastric discomfort, ocassional nausea, gasey feeling and loss of appetite.She has been having early satiety. She had blood test for h.pylori antibody IgG last visit and results are back as positive. Denies hematemesis Denies melena Has been losing weight   Review of Systems    General ROS: negative for - chills, fever, hot flashes or night sweats Psychological ROS: negative Respiratory ROS: no cough, shortness of breath, or wheezing Cardiovascular ROS: no chest pain or dyspnea on exertion Gastrointestinal ROS: no abdominal pain, change in bowel habits, or black or bloody stools Neurological ROS: no TIA or stroke symptoms Dermatological ROS: negative Objective:   Physical Exam  BP 144/82  Pulse 79  Temp(Src) 98 F (36.7 C) (Oral)  Resp 15  Ht 5\' 7"  (1.702 m)  Wt 192 lb (87.091 kg)  BMI 30.06 kg/m2  GI- Mild epigastric discomfort on palpation, no abdominal distension,no guarding, no rigidity, normal bowel sounds HEENT- Moist mucus membrane, PERRLA cvs- normal s1,s2, no murmur respi- b/l cta     Assessment & Plan:  Gastritis with h.pylori infection- hpylori ab result is positive s/o h.pylori infection. Will treat her with triple therapy with amoxicillin 1 g bid, clarithromycin 500 mg bid and omeprazole 20 mg bid for 2 weeks. Has set up gi appointment given her age, early satiey and weight loss along with symptoms of gastritis/ ulcer.she will probably need EGD to rule out mass vs malignancy. Encouraged to avoid skipping meals and complete 2 weeks therapy. counselled about alarming signs like sever epigastric pain, vomiting blood, feeling dizzy/lightheaded to report immediately.   htn- bp overall stable this visit. Continue current regimen and monitor

## 2012-12-29 ENCOUNTER — Ambulatory Visit: Payer: Managed Care, Other (non HMO) | Admitting: Radiation Oncology

## 2012-12-30 ENCOUNTER — Encounter: Payer: Self-pay | Admitting: Gastroenterology

## 2013-01-02 ENCOUNTER — Ambulatory Visit: Payer: Managed Care, Other (non HMO) | Admitting: Gastroenterology

## 2013-01-03 ENCOUNTER — Encounter: Payer: Self-pay | Admitting: Radiation Oncology

## 2013-01-04 ENCOUNTER — Encounter: Payer: Self-pay | Admitting: Radiation Oncology

## 2013-01-04 ENCOUNTER — Ambulatory Visit
Admission: RE | Admit: 2013-01-04 | Discharge: 2013-01-04 | Disposition: A | Discharge status: Home / Self Care | Payer: Managed Care, Other (non HMO) | Source: Ambulatory Visit | Attending: Radiation Oncology | Admitting: Radiation Oncology

## 2013-01-04 VITALS — BP 110/69 | HR 78 | Temp 98.1°F | Ht 67.0 in | Wt 193.5 lb

## 2013-01-04 DIAGNOSIS — C50411 Malignant neoplasm of upper-outer quadrant of right female breast: Secondary | ICD-10-CM

## 2013-01-04 NOTE — Progress Notes (Signed)
Ms. Gates is here for a fu asssesament  Following radiation to her right breast in 2008.   Denies any pain nor discomfort.  #/21/14 mammogram states "No evidence of malignancy."    Her husband died 6 months ago and she states she is coping better.  She works full-time presently.

## 2013-01-04 NOTE — Progress Notes (Signed)
Radiation Oncology         [phone]) [phone] ________________________________  Name: [name] MRN: [id number]  Date: 01/04/2013  DOB: 1951/06/13  Follow-Up Visit Note  CC: Oneal Grout, MD  Bobby Rumpf, MD  Diagnosis:   DCIS of the right breast  Interval Since Last Radiation:  5-1/2 years   Narrative:  The patient returns today for routine follow-up.  The patient states that she is done very well. She denies any symptoms in the breast area. No masses, skin changes, nipple changes. She had a recent mammogram which showed no evidence of malignancy.                              ALLERGIES:  is allergic to hydrocodone.  Meds: Current Outpatient Prescriptions  Medication Sig Dispense Refill  . amLODipine (NORVASC) 10 MG tablet Take 1 tablet (10 mg total) by mouth daily.  90 tablet  3  . aspirin 81 MG tablet Take 1 tablet (81 mg total) by mouth daily.  30 tablet  11  . atorvastatin (LIPITOR) 40 MG tablet Take one tablet by mouth once daily for cholesterol      . Blood Glucose Monitoring Suppl (ONE TOUCH TEST STRIP HOLDER) MISC as directed.        . carvedilol (COREG) 25 MG tablet Take 1 tablet (25 mg total) by mouth 2 (two) times daily with a meal.  180 tablet  3  . Cyanocobalamin (VITAMIN B-12) 1000 MCG SUBL Place 1 tablet (1,000 mcg total) under the tongue daily.  30 each  11  . glipiZIDE (GLUCOTROL XL) 10 MG 24 hr tablet Take 2 tablets (20 mg total) by mouth daily.  180 tablet  3  . hydrochlorothiazide (HYDRODIURIL) 25 MG tablet Take 1 tablet (25 mg total) by mouth daily.  90 tablet  3  . lisinopril (PRINIVIL,ZESTRIL) 5 MG tablet Take 1 tablet (5 mg total) by mouth daily.  90 tablet  3  . metFORMIN (GLUCOPHAGE) 1000 MG tablet Take 1 tablet (1,000 mg total) by mouth 2 (two) times daily with a meal.  180 tablet  3  . multivitamin (THERAGRAN) per tablet Take 1 tablet by mouth daily.  30 tablet  11  . traMADol (ULTRAM) 50 MG tablet Take 1 tablet (50 mg total) by mouth 2 (two) times  daily as needed for pain.  60 tablet  3  . glucose blood (ONE TOUCH TEST STRIPS) test strip Use as instructed- supply enough for twice daily testing.  100 each  12   No current facility-administered medications for this encounter.    Physical Findings: The patient is in no acute distress. Patient is alert and oriented.  height is 5\' 7"  (1.702 m) and weight is 193 lb 8 oz (87.771 kg). Her temperature is 98.1 F (36.7 C). Her blood pressure is 110/69 and her pulse is 78. .   General: Well-developed, in no acute distress HEENT: Normocephalic, atraumatic Cardiovascular: Regular rate and rhythm Respiratory: Clear to auscultation bilaterally Breasts: Right sided lumpectomy incision laterally with some depression, no nodularity or worrisome findings. No masses in the right breast and right axilla. Left breast exam and left axilla unremarkable. GI: Soft, nontender, normal bowel sounds Extremities: No edema present   Lab Findings: Lab Results  Component Value Date   WBC 7.2 12/12/2012   HGB 11.5 12/12/2012   HCT 34.6 12/12/2012   MCV 93 12/12/2012   PLT 162 06/13/2011  Radiographic Findings: No results found.  Impression:    The patient is doing very well, she remains clinically NED from her right-sided breast cancer.   Plan:  The patient will continue routine medical management with her primary care physician. She will return to our clinic on a period basis.   I spent 10 minutes with the patient today, the majority of which was spent counseling the patient on the diagnosis of cancer and coordinating care.   Radene Gunning, M.D., Ph.D.

## 2013-01-17 ENCOUNTER — Ambulatory Visit (INDEPENDENT_AMBULATORY_CARE_PROVIDER_SITE_OTHER): Payer: Managed Care, Other (non HMO) | Admitting: Internal Medicine

## 2013-01-17 ENCOUNTER — Encounter: Payer: Self-pay | Admitting: Internal Medicine

## 2013-01-17 VITALS — BP 106/62 | HR 80 | Ht 67.0 in | Wt 197.0 lb

## 2013-01-17 DIAGNOSIS — I4892 Unspecified atrial flutter: Secondary | ICD-10-CM

## 2013-01-17 DIAGNOSIS — I428 Other cardiomyopathies: Secondary | ICD-10-CM

## 2013-01-17 DIAGNOSIS — N289 Disorder of kidney and ureter, unspecified: Secondary | ICD-10-CM

## 2013-01-17 DIAGNOSIS — I10 Essential (primary) hypertension: Secondary | ICD-10-CM

## 2013-01-17 MED ORDER — APIXABAN 5 MG PO TABS: 5.0000 mg | ORAL_TABLET | Freq: Two times a day (BID) | ORAL | Status: DC

## 2013-01-17 NOTE — Assessment & Plan Note (Signed)
Her blood pressure is falling. We will decreased amlodipine

## 2013-01-17 NOTE — Assessment & Plan Note (Signed)
  The patient was identified as having atrial flutter. We have not yet had a chance to review these ECGs. She however has had an episode where she had transient left body numbness that lasted about 30 minutes concerning for a thromboembolic episode. Her thromboembolic risks are high with a history of cardiomyopathy, diabetes, hypertension, gender and now this for a CHADS-VASc score of 6. We'll begin her on apixaban 5 mg twice daily.

## 2013-01-17 NOTE — Assessment & Plan Note (Signed)
As above Renal function and other parameters are sufficient to allow her to take apixaban 5 mg twice daily

## 2013-01-17 NOTE — Patient Instructions (Signed)
Your physician has recommended you make the following change in your medication:  1) Stop aspirin. 2) Start Eliquis 5 mg one tablet by mouth twice daily 3) Decrease amlodipine to 10 mg 1/2 tablet by mouth once daily.  Your physician has requested that you have an echocardiogram. Echocardiography is a painless test that uses sound waves to create images of your heart. It provides your doctor with information about the size and shape of your heart and how well your heart's chambers and valves are working. This procedure takes approximately one hour. There are no restrictions for this procedure.  Your physician recommends that you schedule a follow-up appointment in: 4 weeks with Dr. Graciela Husbands.

## 2013-01-17 NOTE — Assessment & Plan Note (Addendum)
The patient carries a diagnosis of cardiomyopathy is on ACE inhibitors and beta blockers for care by Dr. Davonna Belling years ago. 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 This patient had been exposed to chemotherapy for breast cancer and so there are a variety of other explanations for her cardiomyopathy process of trying to understand the family risks would be important.  We'll plan to reassess left ventricular function by echo. She notes that her blood pressures decreasing. We will keep her on the lisinopril and carvedilol. The event that we can get her off amlodipine and her LV function is in fact poor I would consider the use of hydralazine nitrates  Surprisingly she has only class II symptoms  There also issues related to her family. Her brother had a defibrillator in no nonobstructive disease. She has 6 surviving family member

## 2013-01-17 NOTE — Progress Notes (Signed)
ELECTROPHYSIOLOGY CONSULT NOTE  Patient ID: [name], MRN: [id number], DOB/AGE: Jul 04, 1951 62 y.o. Admit date: (Not on file) Date of Consult: 01/17/2013  Primary Physician: Oneal Grout, MD Primary Cardiologist:  new  Chief Complaint: flutter   HPI [name] is a 62 y.o. female   Referred because "something was wrong with my ECG." The records suggest that she had atrial flutter. The patient had no interval symptoms of palpitations or exercise intolerance.  She has a remote history of a cardiac problem from which she understood that it was weak and beta blockers and ACE inhibitors were started. She understood that she was released from cardiac followup. We have no records and the current medical record related to her prior care at  [facility]    Functional status is notable for some limitations. She is short of breath at the top of a flight of stairs she denies nocturnal dyspnea orthopnea or peripheral edema. She has no chest pain.   SHe describes an episode a few weeks ago where she had transient left body numbness that lasted about 30 minutes. She has had no residual issues    Past Medical History  Diagnosis Date  . Hypertension   . Arthritis     osteoarthritis  . Diabetes mellitus     TYPE 2  . Breast cancer 2008    right, ER/PR -  . Hx of radiation therapy 04/04/07 to 05/20/07    right breast/6260 cGy  . Hypercholesterolemia   . CHF (congestive heart failure)   . Gastroparesis   . Nonspecific elevation of levels of transaminase or lactic acid dehydrogenase (LDH)   . Regional enteritis of unspecified site   . Restless legs syndrome (RLS)   . Vitamin deficiency   . Depression   . Anxiety   . Malignant neoplasm of breast (female), unspecified site   . Uterine prolapse without mention of vaginal wall prolapse   . Osteoarthrosis, unspecified whether generalized or localized, unspecified site   . Lumbago   . Urinary frequency        Surgical History:  Past Surgical History  Procedure Laterality Date  . Breast lumpectomy  11/17/06    re-excision 01/13/07  . Arthroplasty      left knee  . Wrist surgery      carpel tunnel  . Total knee arthroplasty  06/10/2011    right  . Abdominal hysterectomy  1997    TAH/BSO, ENDOMETRIAL SARCOMA     Home Meds: Prior to Admission medications   Medication Sig Start Date End Date Taking? Authorizing Provider  amLODipine (NORVASC) 10 MG tablet Take 1 tablet (10 mg total) by mouth daily. 06/23/11  Yes Ardyth Gal, MD  aspirin 81 MG tablet Take 1 tablet (81 mg total) by mouth daily. 06/11/11  Yes Ardyth Gal, MD  atorvastatin (LIPITOR) 40 MG tablet Take one tablet by mouth once daily for cholesterol   Yes Historical Provider, MD  Blood Glucose Monitoring Suppl (ONE TOUCH TEST STRIP HOLDER) MISC as directed.     Yes Historical Provider, MD  carvedilol (COREG) 25 MG tablet Take 1 tablet (25 mg total) by mouth 2 (two) times daily with a meal. 06/23/11  Yes Ardyth Gal, MD  Cyanocobalamin (VITAMIN B-12) 1000 MCG SUBL Place 1 tablet (1,000 mcg total) under the tongue daily. 06/11/11  Yes Ardyth Gal, MD  glipiZIDE (GLUCOTROL XL) 10 MG 24 hr tablet Take 2 tablets (20 mg total) by mouth daily. 06/25/11  Yes Ardyth Gal,  MD  hydrochlorothiazide (HYDRODIURIL) 25 MG tablet Take 1 tablet (25 mg total) by mouth daily. 06/23/11  Yes Ardyth Gal, MD  lisinopril (PRINIVIL,ZESTRIL) 5 MG tablet Take 1 tablet (5 mg total) by mouth daily. 12/14/12  Yes Mahima Glade Lloyd, MD  metFORMIN (GLUCOPHAGE) 1000 MG tablet Take 1 tablet (1,000 mg total) by mouth 2 (two) times daily with a meal. 06/23/11  Yes Ardyth Gal, MD  multivitamin [facility]) per tablet Take 1 tablet by mouth daily. 06/11/11  Yes Ardyth Gal, MD  traMADol (ULTRAM) 50 MG tablet Take 1 tablet (50 mg total) by mouth 2 (two) times daily as needed for pain. 01/26/11  Yes Bobby Rumpf, MD  glucose blood  (ONE TOUCH TEST STRIPS) test strip Use as instructed- supply enough for twice daily testing. 05/29/11 05/28/12  Sanjuana Letters, MD      Allergies:  Allergies  Allergen Reactions  . Hydrocodone Nausea And Vomiting    History   Social History  . Marital Status: Married    Spouse Name: N/A    Number of Children: N/A  . Years of Education: N/A   Occupational History  . Not on file.   Social History Main Topics  . Smoking status: Former Smoker -- 0.50 packs/day for 20 years    Quit date: 09/08/1991  . Smokeless tobacco: Not on file  . Alcohol Use: No  . Drug Use: No  . Sexually Active: Not on file   Other Topics Concern  . Not on file   Social History Narrative  . No narrative on file     Family History  Problem Relation Age of Onset  . Diabetes Mother   . Diabetes Father   . Aneurysm Sister   . Arthritis Sister   . Diabetes Brother   . Heart Problems Brother      ROS:  Please see the history of present illness.     All other systems reviewed and negative.    Physical Exam   Blood pressure 106/62, pulse 80, height 5\' 7"  (1.702 m), weight 197 lb (89.359 kg). General: Well developed, well nourished female in no acute distress. Head: Normocephalic, atraumatic, sclera non-icteric, no xanthomas, nares are without discharge. EENT: normal Lymph Nodes:  none Back: without scoliosis/kyphosis , no CVA tendersness Neck: Negative for carotid bruits. JVD not elevated. Lungs: Clear bilaterally to auscultation without wheezes, rales, or rhonchi. Breathing is unlabored. Heart: RRR with S1 S2. +s4  No murmur , rubs,  appreciated. Abdomen: Soft, non-tender, non-distended with normoactive bowel sounds. No hepatomegaly. No rebound/guarding. No obvious abdominal masses. Msk:  Strength and tone appear normal for age. Extremities: No clubbing or cyanosis. No edema.  Distal pedal pulses are 2+ and equal bilaterally. Skin: Warm and Dry Neuro: Alert and oriented X 3. CN III-XII  intact Grossly normal sensory and motor function . Psych:  Responds to questions appropriately with a normal affect.      Labs: Cardiac Enzymes No results found for this basename: CKTOTAL, CKMB, TROPONINI,  in the last 72 hours CBC Lab Results  Component Value Date   WBC 7.2 12/12/2012   HGB 11.5 12/12/2012   HCT 34.6 12/12/2012   MCV 93 12/12/2012   PLT 162 06/13/2011   PROTIME: No results found for this basename: LABPROT, INR,  in the last 72 hours Chemistry No results found for this basename: NA, K, CL, CO2, BUN, CREATININE, CALCIUM, LABALBU, PROT, BILITOT, ALKPHOS, ALT, AST, GLUCOSE,  in the last 168 hours Lipids Lab Results  Component  Value Date   CHOL 226* 05/20/2011   HDL 82 05/20/2011   LDLCALC 129* 05/20/2011   TRIG 74 05/20/2011   BNP No results found for this basename: probnp   Miscellaneous No results found for this basename: DDIMER    Radiology/Studies:  No results found.  EKG:  Sinus rhythm at 80 intervals 23/09/38 Axis    Assessment and Plan:    Sherryl Manges

## 2013-01-18 ENCOUNTER — Other Ambulatory Visit: Payer: Self-pay | Admitting: Internal Medicine

## 2013-01-23 ENCOUNTER — Ambulatory Visit: Payer: Managed Care, Other (non HMO) | Admitting: Gastroenterology

## 2013-01-24 ENCOUNTER — Encounter: Payer: Self-pay | Admitting: *Deleted

## 2013-01-24 ENCOUNTER — Ambulatory Visit (INDEPENDENT_AMBULATORY_CARE_PROVIDER_SITE_OTHER): Payer: Managed Care, Other (non HMO) | Admitting: Nurse Practitioner

## 2013-01-24 ENCOUNTER — Encounter: Payer: Self-pay | Admitting: Nurse Practitioner

## 2013-01-24 VITALS — BP 154/90 | HR 88 | Temp 98.2°F | Resp 20 | Ht 67.0 in | Wt 186.0 lb

## 2013-01-24 DIAGNOSIS — T7840XA Allergy, unspecified, initial encounter: Secondary | ICD-10-CM

## 2013-01-24 MED ORDER — PREDNISONE 20 MG PO TABS: ORAL_TABLET | ORAL | Status: DC

## 2013-01-24 NOTE — Progress Notes (Signed)
Patient ID: [name], female   DOB: 1951-07-03, 62 y.o.   MRN: [id number]  Allergies  Allergen Reactions  . Hydrocodone Nausea And Vomiting    Chief Complaint  Patient presents with  . Acute Visit    face swollen and sore since yesterday    HPI: Patient is a 62 y.o. female seen in the office today for swelling in her face. Reports this has been going on for a year on and off. Reports a lump comes up in her jaw and her face swells. This lump will move from side to side. She noticed the past 2 times she has peanuts her face starts to swell and is tender.  The most recent episode was 4 days ago and she had peanuts and coke and then she started itching- no rash or hives; Has had chills but is unsure of fever Sees dentist regularly who she ordinally reported episodes too a year ago when they first started happening; dentist could not find cause  Denies shortness of breath, difficulty swallowing, swollen tongue but occasionally will have swollen lips. No chest pain or palpitations. No hives or rashes noted.   Review of Systems:  Review of Systems  Constitutional: Positive for chills. Negative for fever and malaise/fatigue.  HENT: Negative for sore throat.   Respiratory: Negative for cough, shortness of breath, wheezing and stridor.   Cardiovascular: Negative for chest pain and leg swelling.  Gastrointestinal: Negative for heartburn, abdominal pain, diarrhea and constipation.  Skin: Positive for itching. Negative for rash.  Neurological: Negative for dizziness and weakness.     Past Medical History  Diagnosis Date  . Hypertension   . Arthritis     osteoarthritis  . Diabetes mellitus     TYPE 2  . Breast cancer 2008    right, ER/PR -  . Hx of radiation therapy 04/04/07 to 05/20/07    right breast/6260 cGy  . Hypercholesterolemia   . CHF (congestive heart failure)   . Gastroparesis   . Nonspecific elevation of levels of transaminase or lactic acid dehydrogenase (LDH)   .  Regional enteritis of unspecified site   . Restless legs syndrome (RLS)   . Vitamin deficiency   . Depression   . Anxiety   . Malignant neoplasm of breast (female), unspecified site   . Uterine prolapse without mention of vaginal wall prolapse   . Osteoarthrosis, unspecified whether generalized or localized, unspecified site   . Lumbago   . Urinary frequency    Past Surgical History  Procedure Laterality Date  . Breast lumpectomy  11/17/06    re-excision 01/13/07  . Arthroplasty      left knee  . Wrist surgery      carpel tunnel  . Total knee arthroplasty  06/10/2011    right  . Abdominal hysterectomy  1997    TAH/BSO, ENDOMETRIAL SARCOMA   Social History:   reports that she quit smoking about 21 years ago. She does not have any smokeless tobacco history on file. She reports that she does not drink alcohol or use illicit drugs.  Family History  Problem Relation Age of Onset  . Diabetes Mother   . Diabetes Father   . Aneurysm Sister   . Arthritis Sister   . Diabetes Brother   . Heart Problems Brother     Medications: Patient's Medications  New Prescriptions   No medications on file  Previous Medications   AMLODIPINE (NORVASC) 10 MG TABLET    Take 1/2 tablet by mouth  once daily   APIXABAN (ELIQUIS) 5 MG TABS TABLET    Take 1 tablet (5 mg total) by mouth 2 (two) times daily.   ATORVASTATIN (LIPITOR) 40 MG TABLET    Take one tablet by mouth once daily for cholesterol   BLOOD GLUCOSE MONITORING SUPPL (ONE TOUCH TEST STRIP HOLDER) MISC    as directed.     CARVEDILOL (COREG) 25 MG TABLET    Take 1 tablet (25 mg total) by mouth 2 (two) times daily with a meal.   [employer] GAS RELIEF 80 MG CHEWABLE TABLET    CHEW 1 TABLET (80 MG TOTAL) BY MOUTH EVERY 6 (SIX) HOURS AS NEEDED FOR FLATULENCE.   CYANOCOBALAMIN (VITAMIN B-12) 1000 MCG SUBL    Place 1 tablet (1,000 mcg total) under the tongue daily.   GLIPIZIDE (GLUCOTROL XL) 10 MG 24 HR TABLET    Take 2 tablets (20 mg total) by mouth daily.    GLUCOSE BLOOD (ONE TOUCH TEST STRIPS) TEST STRIP    Use as instructed- supply enough for twice daily testing.   HYDROCHLOROTHIAZIDE (HYDRODIURIL) 25 MG TABLET    TAKE ONE TABLET BY MOUTH DAILY FOR BLOOD PRESSURE   LISINOPRIL (PRINIVIL,ZESTRIL) 5 MG TABLET    Take 1 tablet (5 mg total) by mouth daily.   METFORMIN (GLUCOPHAGE) 1000 MG TABLET    Take 1 tablet (1,000 mg total) by mouth 2 (two) times daily with a meal.   MULTIVITAMIN (THERAGRAN) PER TABLET    Take 1 tablet by mouth daily.   OMEPRAZOLE (PRILOSEC) 20 MG CAPSULE    TAKE ONE CAPSULE BY MOUTH EVERY DAY   TRAMADOL (ULTRAM) 50 MG TABLET    Take 1 tablet (50 mg total) by mouth 2 (two) times daily as needed for pain.  Modified Medications   No medications on file  Discontinued Medications   ASPIRIN 81 MG TABLET    Take 1 tablet (81 mg total) by mouth daily.     Physical Exam: Physical Exam  Constitutional: She is oriented to person, place, and time and well-developed, well-nourished, and in no distress. No distress.  HENT:  Head: Normocephalic and atraumatic.  Nose: Nose normal.  Mouth/Throat: Oropharynx is clear and moist.  Eyes: Conjunctivae and EOM are normal. Pupils are equal, round, and reactive to light.  Neck: Normal range of motion. Neck supple.  Cardiovascular: Normal rate, regular rhythm and normal heart sounds.   Pulmonary/Chest: Effort normal and breath sounds normal.  Abdominal: Soft. Bowel sounds are normal.  Neurological: She is alert and oriented to person, place, and time.  Skin: Skin is warm and dry. No rash noted. She is not diaphoretic.    Filed Vitals:   01/24/13 1442  BP: 154/90  Pulse: 88  Temp: 98.2 F (36.8 C)  TempSrc: Oral  Resp: 20  Height: 5\' 7"  (1.702 m)  Weight: 186 lb (84.369 kg)  SpO2: 96%      Labs reviewed: Basic Metabolic Panel:  Recent Labs  16/10/96 0827  NA 140  K 3.5  CL 99  CO2 22  GLUCOSE 64*  BUN 27  CREATININE 1.52*  CALCIUM 9.4   Liver Function  Tests:  Recent Labs  12/12/12 0827  AST 30  ALT 26  ALKPHOS 103  BILITOT 0.3  PROT 6.8   No results found for this basename: LIPASE, AMYLASE,  in the last 8760 hours No results found for this basename: AMMONIA,  in the last 8760 hours CBC:  Recent Labs  12/12/12 0827  WBC  7.2  NEUTROABS 3.7  HGB 11.5  HCT 34.6  MCV 93   Lipid Panel: No results found for this basename: CHOL, HDL, LDLCALC, TRIG, CHOLHDL, LDLDIRECT,  in the last 8760 hours   Assessment/Plan Allergy, initial encounter- educated pt to avoid peanuts and anything with potential peanuts  Will give prednisone and referral to allergist to confirm peanut allergy Educated on when to seek medical attention because allergic reactions can progress Pt understands when to go to ER

## 2013-01-24 NOTE — Patient Instructions (Addendum)
AVOID PEANUTS AND PEANUT PRODUCTS    Allergic Reaction, Mild to Moderate Allergies may happen from anything your body is sensitive to. This may be food, medications, pollens, chemicals, and nearly anything around you in everyday life that produces allergens. An allergen is anything that causes an allergy producing substance. Allergens cause your body to release allergic antibodies. Through a chain of events, they cause a release of histamine into the blood stream. Histamines are meant to protect you, but they also cause your discomfort. This is why antihistamines are often used for allergies. Heredity is often a factor in causing allergic reactions. This means you may have some of the same allergies as your parents. Allergies happen in all age groups. You may have some idea of what caused your reaction. There are many allergens around [place]. It may be difficult to know what caused your reaction. If this is a first time event, it may never happen again. Allergies cannot be cured but can be controlled with medications. SYMPTOMS  You may get some or all of the following problems from allergies.  Swelling and itching in and around the mouth.   Tearing, itchy eyes.   Nasal congestion and runny nose.   Sneezing and coughing.   An itchy red rash or hives.   Vomiting or diarrhea.   Difficulty breathing.  Seasonal allergies occur in all age groups. They are seasonal because they usually occur during the same season every year. They may be a reaction to molds, grass pollens, or tree pollens. Other causes of allergies are house dust mite allergens, pet dander and mold spores. These are just a common few of the thousands of allergens around [place]. All of the symptoms listed above happen when you come in contact with pollens and other allergens. Seasonal allergies are usually not life threatening. They are generally more of a nuisance that can often be handled using medications. Hay fever is a combination of all  or some of the above listed allergy problems. It may often be treated with simple over-the-counter medications such as diphenhydramine. Take medication as directed. Check with your caregiver or package insert for child dosages. TREATMENT AND HOME CARE INSTRUCTIONS If hives or rash are present:  Take medications as directed.   You may use an over-the-counter antihistamine (diphenhydramine) for hives and itching as needed. Do not drive or drink alcohol until medications used to treat the reaction have worn off. Antihistamines tend to make people sleepy.   Apply cold cloths (compresses) to the skin or take baths in cool water. This will help itching. Avoid hot baths or showers. Heat will make a rash and itching worse.   If your allergies persist and become more severe, and over the counter medications are not effective, there are many new medications your caretaker can prescribe. Immunotherapy or desensitizing injections can be used if all else fails. Follow up with your caregiver if problems continue.  SEEK MEDICAL CARE IF:   Your allergies are becoming progressively more troublesome.   You suspect a food allergy. Symptoms generally happen within 30 minutes of eating a food.   Your symptoms have not gone away within 2 days or are getting worse.   You develop new symptoms.   You want to retest yourself or your child with a food or drink you think causes an allergic reaction. Never test yourself or your child of a suspected allergy without being under the watchful eye of your caregivers. A second exposure to an allergen may be life-threatening.  SEEK IMMEDIATE MEDICAL CARE IF:  You develop difficulty breathing or wheezing, or have a tight feeling in your chest or throat.   You develop a swollen mouth, hives, swelling, or itching all over your body.  A severe reaction with any of the above problems should be considered life-threatening. If you suddenly develop difficulty breathing call for  local emergency medical help. THIS IS AN EMERGENCY. MAKE SURE YOU:   Understand these instructions.   Will watch your condition.   Will get help right away if you are not doing well or get worse.  Document Released: 06/21/2007 Document Revised: 08/13/2011 Document Reviewed: 06/21/2007 [facility] Patient Information 2012 [place], [place].  Anaphylactic Reaction An anaphylactic reaction is a sudden, severe allergic reaction that involves the whole body. It can be life threatening. A hospital stay is often required. People with asthma, eczema, or hay fever are slightly more likely to have an anaphylactic reaction. CAUSES  An anaphylactic reaction may be caused by anything to which you are allergic. After being exposed to the allergic substance, your immune system becomes sensitized to it. When you are exposed to that allergic substance again, an allergic reaction can occur. Common causes of an anaphylactic reaction include:  Medicines.  Foods, especially peanuts, wheat, shellfish, milk, and eggs.  Insect bites or stings.  Blood products.  Chemicals, such as dyes, latex, and contrast material used for imaging tests. SYMPTOMS  When an allergic reaction occurs, the body releases histamine and other substances. These substances cause symptoms such as tightening of the airway. Symptoms often develop within seconds or minutes of exposure. Symptoms may include:  Skin rash or hives.  Itching.  Chest tightness.  Swelling of the eyes, tongue, or lips.  Trouble breathing or swallowing.  Lightheadedness or fainting.  Anxiety or confusion.  Stomach pains, vomiting, or diarrhea.  Nasal congestion.  A fast or irregular heartbeat (palpitations). DIAGNOSIS  Diagnosis is based on your history of recent exposure to allergic substances, your symptoms, and a physical exam. Your caregiver may also perform blood or urine tests to confirm the diagnosis. TREATMENT  Epinephrine medicine is the main  treatment for an anaphylactic reaction. Other medicines that may be used for treatment include antihistamines, steroids, and albuterol. In severe cases, fluids and medicine to support blood pressure may be given through an intravenous line (IV). Even if you improve after treatment, you need to be observed to make sure your condition does not get worse. This may require a stay in the hospital. HOME CARE INSTRUCTIONS   Wear a medical alert bracelet or necklace stating your allergy.  You and your family must learn how to use an anaphylaxis kit or give an epinephrine injection to temporarily treat an emergency allergic reaction. Always carry your epinephrine injection or anaphylaxis kit with you. This can be lifesaving if you have a severe reaction.  Do not drive or perform tasks after treatment until the medicines used to treat your reaction have worn off, or until your caregiver says it is okay.  If you have hives or a rash:  Take medicines as directed by your caregiver.  You may use an over-the-counter antihistamine (diphenhydramine) as needed.  Apply cold compresses to the skin or take baths in cool water. Avoid hot baths or showers. SEEK MEDICAL CARE IF:   You develop symptoms of an allergic reaction to a new substance. Symptoms may start right away or minutes later.  You develop a rash, hives, or itching.  You develop new symptoms. SEEK IMMEDIATE  MEDICAL CARE IF:   You have swelling of the mouth, difficulty breathing, or wheezing.  You have a tight feeling in your chest or throat.  You develop hives, swelling, or itching all over your body.  You develop severe vomiting or diarrhea.  You feel faint or pass out. This is an emergency. Use your epinephrine injection or anaphylaxis kit as you have been instructed. Call your local emergency services (911 in [place] Even if you improve after the injection, you need to be examined at a hospital emergency department. MAKE SURE YOU:    Understand these instructions.  Will watch your condition.  Will get help right away if you are not doing well or get worse. Document Released: 08/24/2005 Document Revised: 02/23/2012 Document Reviewed: 11/25/2011 [facility] Patient Information 2013 [place], [place].

## 2013-01-25 ENCOUNTER — Telehealth: Payer: Self-pay | Admitting: Nurse Practitioner

## 2013-01-25 MED ORDER — EPINEPHRINE 0.3 MG/0.3ML IJ SOAJ: 0.3000 mg | Freq: Once | INTRAMUSCULAR | Status: DC

## 2013-01-25 NOTE — Telephone Encounter (Signed)
Per Makayla Celeste NP, call patient and tell them that she has phoned in an epi pen just in case she has another reaction, not to use for any type of reaction only when her throat closes, and hard for her to breathe  and call 911.   Daughter [name] has been notified

## 2013-01-27 ENCOUNTER — Other Ambulatory Visit: Payer: Self-pay | Admitting: Internal Medicine

## 2013-02-15 ENCOUNTER — Encounter: Payer: Self-pay | Admitting: Gastroenterology

## 2013-02-15 ENCOUNTER — Ambulatory Visit (INDEPENDENT_AMBULATORY_CARE_PROVIDER_SITE_OTHER): Payer: Managed Care, Other (non HMO) | Admitting: Gastroenterology

## 2013-02-15 VITALS — BP 100/60 | HR 80 | Ht 67.0 in | Wt 188.0 lb

## 2013-02-15 DIAGNOSIS — R194 Change in bowel habit: Secondary | ICD-10-CM

## 2013-02-15 DIAGNOSIS — R198 Other specified symptoms and signs involving the digestive system and abdomen: Secondary | ICD-10-CM

## 2013-02-15 DIAGNOSIS — K3189 Other diseases of stomach and duodenum: Secondary | ICD-10-CM

## 2013-02-15 DIAGNOSIS — R1013 Epigastric pain: Secondary | ICD-10-CM

## 2013-02-15 DIAGNOSIS — R634 Abnormal weight loss: Secondary | ICD-10-CM

## 2013-02-15 DIAGNOSIS — F5000 Anorexia nervosa, unspecified: Secondary | ICD-10-CM

## 2013-02-15 NOTE — Patient Instructions (Addendum)
We will get records sent from your previous gastroenterologist for review.  This will include any endoscopic (colonoscopy or upper endoscopy) procedures and any associated pathology reports (was done here in [place], if you can recall the name of the physician or location of their practice that would help [place] track down those records).  These records may be at Dr. Tad Moore office. You will be set up for a colonoscopy (for change in bowels, diarrhea). You will be set up for an upper endoscopy (for anorexia, nausea). We will contact Dr. Graciela Husbands about holding the eloquis prior to those procedures for 2-3 days.  Please call [phone] to schedule your appointment after reviewing your schedule.                                               We are excited to introduce MyChart, a new best-in-class service that provides you online access to important information in your electronic medical record. We want to make it easier for you to view your health information - all in one secure location - when and where you need it. We expect MyChart will enhance the quality of care and service we provide.  When you register for MyChart, you can:    View your test results.    Request appointments and receive appointment reminders via email.    Request medication renewals.    View your medical history, allergies, medications and immunizations.    Communicate with your physician's office through a password-protected site.    Conveniently print information such as your medication lists.  To find out if MyChart is right for you, please talk to a member of our clinical staff today. We will gladly answer your questions about this free health and wellness tool.  If you are age 64 or older and want a member of your family to have access to your record, you must provide written consent by completing a proxy form available at our office. Please speak to our clinical staff about guidelines regarding accounts for patients  younger than age 25.  As you activate your MyChart account and need any technical assistance, please call the MyChart technical support line at [phone]) or email your question to [email or web address]. If you email your question(s), please include your name, a return phone number and the best time to reach you.  If you have non-urgent health-related questions, you can send a message to our office through MyChart at [email or web address]. If you have a medical emergency, call 911.  Thank you for using MyChart as your new health and wellness resource!   MyChart licensed from [employer],  [phone]. Patents Pending.

## 2013-02-15 NOTE — Progress Notes (Signed)
HPI: This is a  very pleasant 62 year old woman whom I am meeting for first time today.   She had a colonoscopy here in [place] somewhere about 5-6 years ago. She does not recall exactly who did it or where they're practice was located and she does not recall findings either.  She was told she should have another colonoscopy at 10 year interval.  Recently seen by cardiologist and started on a blood thinner called eloquis.  She was also set up with an echocardiogram however she has not had it yet.  Sent by Dr. Audrie Gallus.  Has been having a lot of belching, vomiting. Started about 6 months ago. Started shortly after her husband died last summer.  Vomiting completely stopped.  She was given some "pills" for 7 days and the vomiting went away. This was many months ago. She does not know what pills these were.    She has lost 20-30 pounds in past year.  She has lack of appetite.  No pain after eating.  CBC, complete metabolic profile were both normal 2 months ago except for slightly elevated creatinine. H. pylori antibody was quite elevated.    Does have bloating.    Has been having diarrhea for many months.  This is not getting better. She will take 1-2 immodium per day.  NEver sees blood  In her stool. Without imodium she would have 3-4 stools per day.  She was on abx about a month ago.  She believes she is depressed.  Everything started after her husband died.  Has knee pains, was on aleve periodically but no others.  Usually takes tylenol.   Review of systems: Pertinent positive and negative review of systems were noted in the above HPI section. Complete review of systems was performed and was otherwise normal.    Past Medical History  Diagnosis Date  . Hypertension   . Arthritis     osteoarthritis  . Diabetes mellitus     TYPE 2  . Breast cancer 2008    right, ER/PR -  . Hx of radiation therapy 04/04/07 to 05/20/07    right breast/6260 cGy  . Hypercholesterolemia   . CHF  (congestive heart failure)   . Gastroparesis   . Nonspecific elevation of levels of transaminase or lactic acid dehydrogenase (LDH)   . Regional enteritis of unspecified site   . Restless legs syndrome (RLS)   . Vitamin deficiency   . Depression   . Anxiety   . Malignant neoplasm of breast (female), unspecified site   . Uterine prolapse without mention of vaginal wall prolapse   . Osteoarthrosis, unspecified whether generalized or localized, unspecified site   . Lumbago   . Urinary frequency     Past Surgical History  Procedure Laterality Date  . Breast lumpectomy  11/17/06    re-excision 01/13/07  . Arthroplasty      left knee  . Wrist surgery      carpel tunnel  . Total knee arthroplasty  06/10/2011    right  . Abdominal hysterectomy  1997    TAH/BSO, ENDOMETRIAL SARCOMA    Current Outpatient Prescriptions  Medication Sig Dispense Refill  . amLODipine (NORVASC) 10 MG tablet Take 1/2 tablet by mouth once daily      . apixaban (ELIQUIS) 5 MG TABS tablet Take 1 tablet (5 mg total) by mouth 2 (two) times daily.  60 tablet  11  . atorvastatin (LIPITOR) 40 MG tablet TAKE 1 TABLET DAILY FOR CHOLESTEROL  90 tablet  3  . Blood Glucose Monitoring Suppl (ONE TOUCH TEST STRIP HOLDER) MISC as directed.        . carvedilol (COREG) 25 MG tablet Take 1 tablet (25 mg total) by mouth 2 (two) times daily with a meal.  180 tablet  3  . [employer] GAS RELIEF 80 MG chewable tablet CHEW 1 TABLET (80 MG TOTAL) BY MOUTH EVERY 6 (SIX) HOURS AS NEEDED FOR FLATULENCE.  30 tablet  0  . Cyanocobalamin (VITAMIN B-12) 1000 MCG SUBL Place 1 tablet (1,000 mcg total) under the tongue daily.  30 each  11  . EPINEPHrine (EPIPEN) 0.3 mg/0.3 mL DEVI Inject 0.3 mLs (0.3 mg total) into the muscle once.  1 Device  0  . glipiZIDE (GLUCOTROL XL) 10 MG 24 hr tablet Take 2 tablets (20 mg total) by mouth daily.  180 tablet  3  . hydrochlorothiazide (HYDRODIURIL) 25 MG tablet TAKE ONE TABLET BY MOUTH DAILY FOR BLOOD PRESSURE  90  tablet  0  . lisinopril (PRINIVIL,ZESTRIL) 5 MG tablet Take 1 tablet (5 mg total) by mouth daily.  90 tablet  3  . metFORMIN (GLUCOPHAGE) 1000 MG tablet Take 1 tablet (1,000 mg total) by mouth 2 (two) times daily with a meal.  180 tablet  3  . multivitamin (THERAGRAN) per tablet Take 1 tablet by mouth daily.  30 tablet  11  . omeprazole (PRILOSEC) 20 MG capsule TAKE ONE CAPSULE BY MOUTH EVERY DAY  28 capsule  0  . predniSONE (DELTASONE) 20 MG tablet Take 3 tablets by mouth for 5 days then decrease by one tablet daily until done  18 tablet  0  . traMADol (ULTRAM) 50 MG tablet Take 1 tablet (50 mg total) by mouth 2 (two) times daily as needed for pain.  60 tablet  3  . glucose blood (ONE TOUCH TEST STRIPS) test strip Use as instructed- supply enough for twice daily testing.  100 each  12   No current facility-administered medications for this visit.    Allergies as of 02/15/2013 - Review Complete 02/15/2013  Allergen Reaction Noted  . Hydrocodone Nausea And Vomiting 01/01/2012    Family History  Problem Relation Age of Onset  . Diabetes Mother   . Diabetes Father   . Aneurysm Sister   . Arthritis Sister   . Diabetes Brother   . Heart Problems Brother     History   Social History  . Marital Status: Married    Spouse Name: N/A    Number of Children: N/A  . Years of Education: N/A   Occupational History  . Not on file.   Social History Main Topics  . Smoking status: Former Smoker -- 0.50 packs/day for 20 years    Quit date: 09/08/1991  . Smokeless tobacco: Never Used  . Alcohol Use: No  . Drug Use: No  . Sexually Active: Not on file   Other Topics Concern  . Not on file   Social History Narrative  . No narrative on file       Physical Exam: BP 100/60  Pulse 80  Ht 5\' 7"  (1.702 m)  Wt 188 lb (85.276 kg)  BMI 29.44 kg/m2 Constitutional: generally well-appearing Psychiatric: alert and oriented x3 Eyes: extraocular movements intact Mouth: oral pharynx moist, no  lesions Neck: supple no lymphadenopathy Cardiovascular: heart regular rate and rhythm Lungs: clear to auscultation bilaterally Abdomen: soft, nontender, nondistended, no obvious ascites, no peritoneal signs, normal bowel sounds Extremities: no lower extremity edema bilaterally Skin: no  lesions on visible extremities    Assessment and plan: 62 y.o. female with  change in bowels, looser stools than usual, anorexia, nausea, weight loss  we will try to track down her previous gastroenterology testing. It was done somewhere here in [place] but she does not remember exactly where or when. That was 5-6 years ago and since her symptoms have clearly started just in the past few months I think we will need to proceed with colonoscopy for her chronic diarrhea, bowel changes as well as upper endoscopy for her dyspepsia, bloating, anorexia and nausea. She was just started on a blood thinner for I believe cardiomyopathy. She is due for an echocardiogram later this week and I would like to wait to see exactly what that shows prior to scheduling her tests. We will also need permission to hold her blood thinning medicine from her cardiologist which we will look into.

## 2013-02-16 ENCOUNTER — Telehealth: Payer: Self-pay | Admitting: Gastroenterology

## 2013-02-16 NOTE — Telephone Encounter (Signed)
Noted  

## 2013-02-17 ENCOUNTER — Other Ambulatory Visit (HOSPITAL_COMMUNITY): Payer: Managed Care, Other (non HMO)

## 2013-02-17 ENCOUNTER — Other Ambulatory Visit: Payer: Self-pay

## 2013-02-17 ENCOUNTER — Ambulatory Visit (HOSPITAL_COMMUNITY): Payer: Managed Care, Other (non HMO) | Attending: Internal Medicine | Admitting: Radiology

## 2013-02-17 ENCOUNTER — Ambulatory Visit (INDEPENDENT_AMBULATORY_CARE_PROVIDER_SITE_OTHER): Payer: Managed Care, Other (non HMO) | Admitting: Internal Medicine

## 2013-02-17 ENCOUNTER — Encounter: Payer: Self-pay | Admitting: Internal Medicine

## 2013-02-17 VITALS — BP 120/72 | HR 80 | Ht 67.0 in | Wt 191.0 lb

## 2013-02-17 DIAGNOSIS — I4892 Unspecified atrial flutter: Secondary | ICD-10-CM

## 2013-02-17 DIAGNOSIS — E785 Hyperlipidemia, unspecified: Secondary | ICD-10-CM | POA: Insufficient documentation

## 2013-02-17 DIAGNOSIS — C50919 Malignant neoplasm of unspecified site of unspecified female breast: Secondary | ICD-10-CM | POA: Insufficient documentation

## 2013-02-17 DIAGNOSIS — I428 Other cardiomyopathies: Secondary | ICD-10-CM | POA: Insufficient documentation

## 2013-02-17 DIAGNOSIS — I10 Essential (primary) hypertension: Secondary | ICD-10-CM | POA: Insufficient documentation

## 2013-02-17 DIAGNOSIS — I6350 Cerebral infarction due to unspecified occlusion or stenosis of unspecified cerebral artery: Secondary | ICD-10-CM

## 2013-02-17 DIAGNOSIS — I509 Heart failure, unspecified: Secondary | ICD-10-CM | POA: Insufficient documentation

## 2013-02-17 DIAGNOSIS — I639 Cerebral infarction, unspecified: Secondary | ICD-10-CM

## 2013-02-17 DIAGNOSIS — E119 Type 2 diabetes mellitus without complications: Secondary | ICD-10-CM | POA: Insufficient documentation

## 2013-02-17 NOTE — Progress Notes (Signed)
Echocardiogram performed.  

## 2013-02-17 NOTE — Assessment & Plan Note (Signed)
Well controlled. We'll discontinue her amlodipine. If she needs more medication up titration of her ACE inhibitor might make more sense

## 2013-02-17 NOTE — Assessment & Plan Note (Signed)
We have discussed the importance of the presence of atrial fibrillation given her TIA. I am thinking in terms of using an implantable recorder as her thromboembolic risk profile is still very high and continuing the apixaban until we know for sure. We'll also undertake an MRI to see if we can find evidence of ischemic infarct

## 2013-02-17 NOTE — Patient Instructions (Addendum)
Your physician wants you to follow-up in:   6 MONTHS WITH  DR Logan Bores will receive a reminder letter in the mail two months in advance. If you don't receive a letter, please call our office to schedule the follow-up appointment. Your physician has recommended you make the following change in your medication: STOP  AMLODIPINE MRI OF BRAIN

## 2013-02-17 NOTE — Assessment & Plan Note (Signed)
We'll continue her on her beta blockers and ACE inhibitors. She has diabetes; the latter can be used for renal protection

## 2013-02-17 NOTE — Progress Notes (Signed)
Patient Care Team: Oneal Grout, MD as PCP - General (Internal Medicine)   HPI  [name] is a 62 y.o. female Seen in followup for a diagnosis of  atrial flutter/fibrillation in the context of a cardiomyopathy and a prior TIA. At our last visit in May she was started on apixaban.  She had an echo  this morning  We reviewed the electrocardiogram that was diagnosed his atrial flutter. It shows sinus rhythm. I have reviewed all ECGs in epic. There is no evidence of atrial arrhythmia.  She feels she is doing much better. When I ask her to it, she said it was because she was getting were used to life after her husband died about a year ago. The 2 of them have been married 10 years. She had [facility] at [facility]  Catheterization 2001 demonstrated ejection fraction of 40%1; repeat assessment in 2006 by nuclear study had an ejection fraction of 48% By  2009 ejection fraction had normalized by echocardiogram. Preliminary report for echocardiogram today confirms normalization of LV function  Hospital records reviewed   Past Medical History  Diagnosis Date  . Hypertension   . Arthritis     osteoarthritis  . Diabetes mellitus     TYPE 2  . Breast cancer 2008    right, ER/PR -  . Hx of radiation therapy 04/04/07 to 05/20/07    right breast/6260 cGy  . Hypercholesterolemia   . CHF (congestive heart failure)   . Gastroparesis   . Nonspecific elevation of levels of transaminase or lactic acid dehydrogenase (LDH)   . Regional enteritis of unspecified site   . Restless legs syndrome (RLS)   . Vitamin deficiency   . Depression   . Anxiety   . Malignant neoplasm of breast (female), unspecified site   . Uterine prolapse without mention of vaginal wall prolapse   . Osteoarthrosis, unspecified whether generalized or localized, unspecified site   . Lumbago   . Urinary frequency     Past Surgical History  Procedure Laterality Date  . Breast lumpectomy  11/17/06    re-excision 01/13/07   . Arthroplasty      left knee  . Wrist surgery      carpel tunnel  . Total knee arthroplasty  06/10/2011    right  . Abdominal hysterectomy  1997    TAH/BSO, ENDOMETRIAL SARCOMA    Current Outpatient Prescriptions  Medication Sig Dispense Refill  . amLODipine (NORVASC) 10 MG tablet Take 1/2 tablet by mouth once daily      . apixaban (ELIQUIS) 5 MG TABS tablet Take 1 tablet (5 mg total) by mouth 2 (two) times daily.  60 tablet  11  . atorvastatin (LIPITOR) 40 MG tablet TAKE 1 TABLET DAILY FOR CHOLESTEROL  90 tablet  3  . Blood Glucose Monitoring Suppl (ONE TOUCH TEST STRIP HOLDER) MISC as directed.        . carvedilol (COREG) 25 MG tablet Take 1 tablet (25 mg total) by mouth 2 (two) times daily with a meal.  180 tablet  3  . [employer]S GAS RELIEF 80 MG chewable tablet CHEW 1 TABLET (80 MG TOTAL) BY MOUTH EVERY 6 (SIX) HOURS AS NEEDED FOR FLATULENCE.  30 tablet  0  . Cyanocobalamin (VITAMIN B-12) 1000 MCG SUBL Place 1 tablet (1,000 mcg total) under the tongue daily.  30 each  11  . EPINEPHrine (EPIPEN) 0.3 mg/0.3 mL DEVI Inject 0.3 mLs (0.3 mg total) into the muscle once.  1 Device  0  . glipiZIDE (GLUCOTROL XL) 10 MG 24 hr tablet Take 2 tablets (20 mg total) by mouth daily.  180 tablet  3  . glucose blood (ONE TOUCH TEST STRIPS) test strip Use as instructed- supply enough for twice daily testing.  100 each  12  . hydrochlorothiazide (HYDRODIURIL) 25 MG tablet TAKE ONE TABLET BY MOUTH DAILY FOR BLOOD PRESSURE  90 tablet  0  . lisinopril (PRINIVIL,ZESTRIL) 5 MG tablet Take 1 tablet (5 mg total) by mouth daily.  90 tablet  3  . metFORMIN (GLUCOPHAGE) 1000 MG tablet Take 1 tablet (1,000 mg total) by mouth 2 (two) times daily with a meal.  180 tablet  3  . multivitamin (THERAGRAN) per tablet Take 1 tablet by mouth daily.  30 tablet  11  . omeprazole (PRILOSEC) 20 MG capsule TAKE ONE CAPSULE BY MOUTH EVERY DAY  28 capsule  0  . predniSONE (DELTASONE) 20 MG tablet Take 3 tablets by mouth for 5 days then  decrease by one tablet daily until done  18 tablet  0  . traMADol (ULTRAM) 50 MG tablet Take 1 tablet (50 mg total) by mouth 2 (two) times daily as needed for pain.  60 tablet  3   No current facility-administered medications for this visit.    Allergies  Allergen Reactions  . Hydrocodone Nausea And Vomiting    Review of Systems negative except from HPI and PMH  Physical Exam BP 120/72  Pulse 80  Ht 5\' 7"  (1.702 m)  Wt 191 lb (86.637 kg)  BMI 29.91 kg/m2 Well developed and well nourished in no acute distress HENT normal E scleral and icterus clear Neck Supple JVP flat; carotids brisk and full Clear to ausculation  Regular rate and rhythm, no murmurs gallops or rub Soft with active bowel sounds No clubbing cyanosis none Edema Alert and oriented, grossly normal motor and sensory function Skin Warm and Dry   ECG today demonstrates sinus rhythm at 79 Intervals 20/08/37 Possible septal MI Nonspecific ST changes   Assessment and  Plan

## 2013-02-20 ENCOUNTER — Telehealth: Payer: Self-pay | Admitting: Internal Medicine

## 2013-02-20 DIAGNOSIS — I10 Essential (primary) hypertension: Secondary | ICD-10-CM

## 2013-02-20 NOTE — Telephone Encounter (Signed)
BMP needs to be ordered for the patient and she also needs something for claustrophobia for her MRI. Will forward to Dr. Graciela Husbands for pre-med order.

## 2013-02-21 ENCOUNTER — Other Ambulatory Visit (INDEPENDENT_AMBULATORY_CARE_PROVIDER_SITE_OTHER): Payer: Managed Care, Other (non HMO)

## 2013-02-21 DIAGNOSIS — I10 Essential (primary) hypertension: Secondary | ICD-10-CM

## 2013-02-22 LAB — BASIC METABOLIC PANEL
BUN: 32 mg/dL — ABNORMAL HIGH (ref 6–23)
Chloride: 99 mEq/L (ref 96–112)
Creatinine, Ser: 1.4 mg/dL — ABNORMAL HIGH (ref 0.4–1.2)
GFR: 47.89 mL/min — ABNORMAL LOW (ref >60.00)
Glucose, Bld: 191 mg/dL — ABNORMAL HIGH (ref 70–99)

## 2013-02-24 ENCOUNTER — Other Ambulatory Visit: Payer: Managed Care, Other (non HMO)

## 2013-02-27 ENCOUNTER — Other Ambulatory Visit: Payer: Managed Care, Other (non HMO)

## 2013-02-27 ENCOUNTER — Telehealth: Payer: Self-pay

## 2013-02-27 NOTE — Telephone Encounter (Signed)
Message copied by Donata Duff on Mon Feb 27, 2013  8:13 AM ------      Message from: Donata Duff      Created: Thu Feb 16, 2013  9:24 AM       Pt would like the endo colon scheduled for 8/13 or 8/14.  Schedule is not out yet I will check and get it scheduled  ------

## 2013-02-28 NOTE — Telephone Encounter (Signed)
No available appt for 8/13 or 8/14 pt was asleep and family member was notified to have the pt call as soon as available.

## 2013-03-01 ENCOUNTER — Telehealth: Payer: Self-pay

## 2013-03-01 NOTE — Telephone Encounter (Signed)
Waiting for anticoag from Dr Graciela Husbands

## 2013-03-01 NOTE — Telephone Encounter (Signed)
See alternate note  

## 2013-03-01 NOTE — Telephone Encounter (Signed)
Message copied by Donata Duff on Wed Mar 01, 2013  8:17 AM ------      Message from: Donata Duff      Created: Wed Feb 15, 2013  1:47 PM       Waiting for anticoag from Dr Graciela Husbands       ------

## 2013-03-01 NOTE — Telephone Encounter (Signed)
Pt has been unable to schedule procedures because of her work schedule she will call back and schedule as soon as she can get time off.  Anti coag letter also resent to Dr Graciela Husbands

## 2013-03-02 ENCOUNTER — Telehealth: Payer: Self-pay | Admitting: Gastroenterology

## 2013-03-02 NOTE — Telephone Encounter (Signed)
Message copied by Rachael Fee on Thu Mar 02, 2013  2:00 PM ------      Message from: Duke Salvia      Created: Thu Mar 02, 2013  1:09 PM       Jesusita Oka, . Issue on holding her   apixaban is the concern based on her recent evaluation of transient numbness that she had a relatively recent TIA. Unfortunately her insurance company is not making it is he to clarify this unless the need for her colonoscopy is urgent/emergent I would recommend that it be deferred thanks [name] ------

## 2013-03-02 NOTE — Telephone Encounter (Signed)
Pt endo colon has not been scheduled pt is aware and will call to make appt in 5-6 weeks

## 2013-03-02 NOTE — Telephone Encounter (Signed)
[name], Thanks.  I agree with holding on her endoscopic procedures for now since she may have had TIA recently    [name], Can you call her, cancel the upcoming colonoscopy/egd if she already has times set.  ROV with me in 5-6 weeks to discuss further.

## 2013-03-24 ENCOUNTER — Other Ambulatory Visit: Payer: Managed Care, Other (non HMO)

## 2013-03-24 DIAGNOSIS — E0821 Diabetes mellitus due to underlying condition with diabetic nephropathy: Secondary | ICD-10-CM

## 2013-03-25 LAB — BASIC METABOLIC PANEL
Chloride: 104 mmol/L (ref 97–108)
GFR calc Af Amer: 58 mL/min/{1.73_m2} — ABNORMAL LOW (ref >59)
GFR calc non Af Amer: 50 mL/min/{1.73_m2} — ABNORMAL LOW (ref >59)
Glucose: 66 mg/dL (ref 65–99)
Potassium: 3.7 mmol/L (ref 3.5–5.2)
Sodium: 140 mmol/L (ref 134–144)

## 2013-03-25 LAB — HEMOGLOBIN A1C: Hgb A1c MFr Bld: 7.5 % — ABNORMAL HIGH (ref 4.8–5.6)

## 2013-03-28 ENCOUNTER — Ambulatory Visit (INDEPENDENT_AMBULATORY_CARE_PROVIDER_SITE_OTHER): Payer: Managed Care, Other (non HMO) | Admitting: Internal Medicine

## 2013-03-28 ENCOUNTER — Encounter: Payer: Self-pay | Admitting: Internal Medicine

## 2013-03-28 VITALS — BP 144/82 | HR 62 | Temp 98.1°F | Resp 14 | Ht 67.0 in | Wt 190.8 lb

## 2013-03-28 DIAGNOSIS — I10 Essential (primary) hypertension: Secondary | ICD-10-CM

## 2013-03-28 DIAGNOSIS — E0821 Diabetes mellitus due to underlying condition with diabetic nephropathy: Secondary | ICD-10-CM

## 2013-03-28 DIAGNOSIS — E7800 Pure hypercholesterolemia, unspecified: Secondary | ICD-10-CM

## 2013-03-28 DIAGNOSIS — R634 Abnormal weight loss: Secondary | ICD-10-CM

## 2013-03-28 DIAGNOSIS — N058 Unspecified nephritic syndrome with other morphologic changes: Secondary | ICD-10-CM

## 2013-03-28 DIAGNOSIS — I428 Other cardiomyopathies: Secondary | ICD-10-CM

## 2013-03-28 DIAGNOSIS — E1329 Other specified diabetes mellitus with other diabetic kidney complication: Secondary | ICD-10-CM

## 2013-03-28 MED ORDER — LOSARTAN POTASSIUM 25 MG PO TABS: 25.0000 mg | ORAL_TABLET | Freq: Every day | ORAL | Status: DC

## 2013-03-28 NOTE — Progress Notes (Signed)
Patient ID: [name], female   DOB: 09-Apr-1951, 62 y.o.   MRN: [id number]  Chief Complaint  Patient presents with  . Medical Managment of Chronic Issues   Allergies  Allergen Reactions  . Hydrocodone Nausea And Vomiting    HPI:   Patient here for her routine visit. She has been doing well. She has been seen by cardiology and started on apixaban for cardiomyopathy. Reviewed cardiology note and echocardiogram showing normal LV function. She has also been seen by GI since I saw her last for her weight loss and early satiety. She had H Pylori infection and was treated in the office- completed treatment. But her symptoms persisted and was seen by Gi. Reviewed notes from GI - plan is to get EGD and colonoscopy. She has also seen the allergist and there is concerns for allergy to lisinopril. She continues to take this and takes allergy medications. Her diarrhea has resolved. her appetite is improving and she has gained some weight She has been compliant with her medications.  Her sugar readings was not checked this am. Her cbg was 126 in the afternoon. cbg at hoem ranging between 80-90 in am and 110-150 after meals. No reading above 200   Review of Systems:  General ROS: negative for - chills, fever, hot flashes or night sweats Psychological ROS: negative, mood improved Ophthalmic ROS: negative ENT ROS: negative Hematological and Lymphatic ROS: negative Endocrine ROS: negative for - breast changes, malaise/lethargy, mood swings, palpitations, polydipsia/polyuria or temperature intolerance Breast ROS: negative for breast lumps Respiratory ROS: no cough, shortness of breath, or wheezing Cardiovascular ROS: no chest pain or dyspnea on exertion Gastrointestinal ROS: no abdominal pain, change in bowel habits, or black or bloody stools Genito-Urinary ROS: no dysuria, trouble voiding, or hematuria Musculoskeletal ROS: negative for - joint pain, joint stiffness, joint swelling or muscle  pain Neurological ROS: no TIA or stroke symptoms Dermatological ROS: negative   Physical Exam  Constitutional: She is oriented to person, place, and time. She appears well-developed and well-nourished. No distress.  HENT:  Head: Normocephalic and atraumatic.  Mouth/Throat: Oropharynx is clear and moist.  Eyes: Conjunctivae are normal. Pupils are equal, round, and reactive to light.  Neck: Normal range of motion. Neck supple. No JVD present.  Cardiovascular: Normal rate, regular rhythm and normal heart sounds.   No murmur heard. Pulmonary/Chest: Effort normal and breath sounds normal. No respiratory distress. She has no wheezes. She exhibits no tenderness.  Abdominal: Soft. Bowel sounds are normal. She exhibits no distension and no mass. There is no tenderness.  Musculoskeletal: Normal range of motion. She exhibits no edema and no tenderness.  Lymphadenopathy:    She has no cervical adenopathy.  Neurological: She is alert and oriented to person, place, and time. No cranial nerve deficit.  Skin: Skin is warm and dry. No rash noted. She is not diaphoretic. No erythema.  Psychiatric: She has a normal mood and affect. Her behavior is normal.    CBC    Component Value Date/Time   WBC 7.2 12/12/2012 0827   WBC 9.1 06/13/2011 0500   WBC 5.3 11/23/2007 1536   RBC 3.74* 12/12/2012 0827   RBC 2.90* 06/13/2011 0500   RBC 3.75 11/23/2007 1536   HGB 11.5 12/12/2012 0827   HGB 10.9* 11/23/2007 1536   HCT 34.6 12/12/2012 0827   HCT 32.0* 11/23/2007 1536   PLT 162 06/13/2011 0500   PLT 288 11/23/2007 1536   MCV 93 12/12/2012 0827   MCV 85.4 11/23/2007  1536   MCH 30.7 12/12/2012 0827   MCH 30.0 06/13/2011 0500   MCH 29.2 11/23/2007 1536   MCHC 33.2 12/12/2012 0827   MCHC 33.7 06/13/2011 0500   MCHC 34.2 11/23/2007 1536   RDW 14.2 12/12/2012 0827   RDW 15.0 06/13/2011 0500   RDW 16.3* 11/23/2007 1536   LYMPHSABS 2.8 12/12/2012 0827   LYMPHSABS 2.8 06/02/2011 1123   LYMPHSABS 1.6 11/23/2007 1536   MONOABS 0.4  06/02/2011 1123   MONOABS 0.4 11/23/2007 1536   EOSABS 0.1 12/12/2012 0827   EOSABS 0.5 06/02/2011 1123   BASOSABS 0.0 12/12/2012 0827   BASOSABS 0.0 06/02/2011 1123   BASOSABS 0.0 11/23/2007 1536   CMP     Component Value Date/Time   NA 140 03/24/2013 0832   NA 134* 02/21/2013 0953   K 3.7 03/24/2013 0832   CL 104 03/24/2013 0832   CO2 21 03/24/2013 0832   GLUCOSE 66 03/24/2013 0832   GLUCOSE 191* 02/21/2013 0953   BUN 24 03/24/2013 0832   BUN 32* 02/21/2013 0953   CREATININE 1.15* 03/24/2013 0832   CREATININE 0.82 07/08/2011 1637   CALCIUM 9.7 03/24/2013 0832   PROT 6.8 12/12/2012 0827   PROT 7.4 07/08/2011 1637   ALBUMIN 4.3 07/08/2011 1637   AST 30 12/12/2012 0827   ALT 26 12/12/2012 0827   ALKPHOS 103 12/12/2012 0827   BILITOT 0.3 12/12/2012 0827   GFRNONAA 50* 03/24/2013 0832   GFRAA 58* 03/24/2013 0832   a1c- 7.5  ASSESSMENT/PLAN  HYPERTENSION, BENIGN SYSTEMIC bp under control. With concerns for swelling being caused by lisinopril, will d/c this and start her on losartan 25 mg daily. Continue current dose of amlodipine, carvedilol and hctz   Diabetes mellitus due to underlying condition with diabetic nephropathy Recent a1c is 7.5 . Continue metformin 1000 mg bid and glipizide 20 mg daily. No hypoglycemic episodes- warned about hypoglycemic episodes.uptodate with foot exam, urine microalbumin. Continue asa and simvastatin. Gastroparesis under control  Cardiomyopathy Continue b blocker and apixaban. ARB started today  Loss of weight Weight has been stable now - she has gained some. Has pending EGD and colonoscopy  Hyperlipidemia continue lipitor for now

## 2013-04-29 ENCOUNTER — Other Ambulatory Visit: Payer: Self-pay | Admitting: Internal Medicine

## 2013-06-01 ENCOUNTER — Telehealth: Payer: Self-pay

## 2013-06-01 NOTE — Telephone Encounter (Signed)
Pt procedures were cancelled and the pt is to call back to schedule follow up

## 2013-06-01 NOTE — Telephone Encounter (Signed)
Message copied by Donata Duff on Thu Jun 01, 2013  8:46 AM ------      Message from: Donata Duff      Created: Wed Mar 01, 2013  2:27 PM       Dr Graciela Husbands anti coag ------

## 2013-07-17 ENCOUNTER — Other Ambulatory Visit: Payer: Managed Care, Other (non HMO)

## 2013-07-17 DIAGNOSIS — E0821 Diabetes mellitus due to underlying condition with diabetic nephropathy: Secondary | ICD-10-CM

## 2013-07-17 DIAGNOSIS — E7800 Pure hypercholesterolemia, unspecified: Secondary | ICD-10-CM

## 2013-07-17 DIAGNOSIS — I10 Essential (primary) hypertension: Secondary | ICD-10-CM

## 2013-07-18 LAB — BASIC METABOLIC PANEL
CO2: 21 mmol/L (ref 18–29)
Calcium: 9.5 mg/dL (ref 8.6–10.2)
Creatinine, Ser: 1.29 mg/dL — ABNORMAL HIGH (ref 0.57–1.00)
GFR calc Af Amer: 51 mL/min/{1.73_m2} — ABNORMAL LOW (ref >59)
GFR calc non Af Amer: 44 mL/min/{1.73_m2} — ABNORMAL LOW (ref >59)
Glucose: 58 mg/dL — ABNORMAL LOW (ref 65–99)
Sodium: 141 mmol/L (ref 134–144)

## 2013-07-18 LAB — LIPID PANEL
Cholesterol, Total: 204 mg/dL — ABNORMAL HIGH (ref 100–199)
HDL: 104 mg/dL (ref >39)
Triglycerides: 78 mg/dL (ref 0–149)
VLDL Cholesterol Cal: 16 mg/dL (ref 5–40)

## 2013-07-22 ENCOUNTER — Other Ambulatory Visit: Payer: Self-pay | Admitting: Internal Medicine

## 2013-07-29 ENCOUNTER — Other Ambulatory Visit: Payer: Self-pay | Admitting: Internal Medicine

## 2013-08-04 ENCOUNTER — Other Ambulatory Visit: Payer: Self-pay | Admitting: Internal Medicine

## 2013-08-15 ENCOUNTER — Encounter: Payer: Self-pay | Admitting: *Deleted

## 2013-08-16 ENCOUNTER — Ambulatory Visit (INDEPENDENT_AMBULATORY_CARE_PROVIDER_SITE_OTHER): Payer: Managed Care, Other (non HMO) | Admitting: Internal Medicine

## 2013-08-16 ENCOUNTER — Encounter: Payer: Self-pay | Admitting: Internal Medicine

## 2013-08-16 VITALS — BP 158/90 | HR 75 | Temp 98.3°F | Resp 12 | Wt 196.8 lb

## 2013-08-16 DIAGNOSIS — N189 Chronic kidney disease, unspecified: Secondary | ICD-10-CM | POA: Insufficient documentation

## 2013-08-16 DIAGNOSIS — E1329 Other specified diabetes mellitus with other diabetic kidney complication: Secondary | ICD-10-CM

## 2013-08-16 DIAGNOSIS — N182 Chronic kidney disease, stage 2 (mild): Secondary | ICD-10-CM

## 2013-08-16 DIAGNOSIS — G4700 Insomnia, unspecified: Secondary | ICD-10-CM

## 2013-08-16 DIAGNOSIS — E0821 Diabetes mellitus due to underlying condition with diabetic nephropathy: Secondary | ICD-10-CM

## 2013-08-16 DIAGNOSIS — M1710 Unilateral primary osteoarthritis, unspecified knee: Secondary | ICD-10-CM

## 2013-08-16 DIAGNOSIS — E119 Type 2 diabetes mellitus without complications: Secondary | ICD-10-CM

## 2013-08-16 DIAGNOSIS — I10 Essential (primary) hypertension: Secondary | ICD-10-CM

## 2013-08-16 DIAGNOSIS — N058 Unspecified nephritic syndrome with other morphologic changes: Secondary | ICD-10-CM

## 2013-08-16 MED ORDER — MELATONIN 5 MG PO CAPS: 1.0000 | ORAL_CAPSULE | Freq: Every day | ORAL | Status: DC

## 2013-08-16 MED ORDER — METFORMIN HCL 1000 MG PO TABS: 1000.0000 mg | ORAL_TABLET | Freq: Two times a day (BID) | ORAL | Status: DC

## 2013-08-16 MED ORDER — TRAMADOL HCL 50 MG PO TABS: ORAL_TABLET | ORAL | Status: DC

## 2013-08-16 MED ORDER — ATORVASTATIN CALCIUM 40 MG PO TABS: ORAL_TABLET | ORAL | Status: DC

## 2013-08-16 MED ORDER — GLIPIZIDE ER 5 MG PO TB24: 5.0000 mg | ORAL_TABLET | Freq: Every day | ORAL | Status: DC

## 2013-08-16 MED ORDER — LOSARTAN POTASSIUM 50 MG PO TABS: 50.0000 mg | ORAL_TABLET | Freq: Every day | ORAL | Status: DC

## 2013-08-16 MED ORDER — ZOSTER VACCINE LIVE 19400 UNT/0.65ML ~~LOC~~ SOLR: 0.6500 mL | Freq: Once | SUBCUTANEOUS | Status: DC

## 2013-08-16 MED ORDER — HYDROCHLOROTHIAZIDE 25 MG PO TABS: ORAL_TABLET | ORAL | Status: DC

## 2013-08-16 MED ORDER — CARVEDILOL 25 MG PO TABS: 25.0000 mg | ORAL_TABLET | Freq: Two times a day (BID) | ORAL | Status: DC

## 2013-08-16 NOTE — Progress Notes (Signed)
Patient ID: [name], female   DOB: 1951/04/25, 62 y.o.   MRN: [id number]  Chief Complaint  Patient presents with  . Follow-up  . Sleeping Problem    Trouble falling and staying asleep     Allergies  Allergen Reactions  . Hydrocodone Nausea And Vomiting  . Lisinopril     HPI 62 y/o female patient is here for routine follow up. Her bp has been running high at home. She is unable to stay asleep. She works at night and then goes to sleep during daytime. Denies any new stressors. Compliant with her other medications. Labs reviewed. No other concerns. No hypoglycemic episodes after reducing the dose of glipizide    Review of Systems:  Constitutional: Negative for fever, chills, weight loss, malaise/fatigue and diaphoresis.  HENT: Negative for congestion, hearing loss and sore throat.   Eyes: Negative for blurred vision, double vision and discharge.  Respiratory: Negative for cough, sputum production, shortness of breath and wheezing.   Cardiovascular: Negative for chest pain, palpitations, orthopnea and leg swelling.  Gastrointestinal: Negative for heartburn, nausea, vomiting, abdominal pain, diarrhea and constipation.  Genitourinary: Negative for dysuria, urgency, frequency and flank pain.  Musculoskeletal: Negative for back pain, falls, joint pain and myalgias.  Skin: Negative for itching and rash.  Neurological: Negative for dizziness, tingling, focal weakness and headaches.  Psychiatric/Behavioral: Negative for depression and memory loss. The patient is not nervous/anxious.    Physical Exam  BP 158/90  Pulse 75  Temp(Src) 98.3 F (36.8 C) (Oral)  Resp 12  Wt 196 lb 12.8 oz (89.268 kg)  SpO2 99%  Constitutional: She is oriented to person, place, and time. She appears well-developed and well-nourished. No distress.  HENT:   Head: Normocephalic and atraumatic.   Mouth/Throat: Oropharynx is clear and moist.  Eyes: Conjunctivae are normal. Pupils are equal, round, and  reactive to light.  Neck: Normal range of motion. Neck supple. No JVD present.  Cardiovascular: Normal rate, regular rhythm and normal heart sounds.    No murmur heard. Pulmonary/Chest: Effort normal and breath sounds normal. No respiratory distress. She has no wheezes. She exhibits no tenderness.  Abdominal: Soft. Bowel sounds are normal. She exhibits no distension and no mass. There is no tenderness.  Musculoskeletal: Normal range of motion. She exhibits no edema and no tenderness.  Lymphadenopathy:    She has no cervical adenopathy.  Neurological: She is alert and oriented to person, place, and time. No cranial nerve deficit.  Skin: Skin is warm and dry. No rash noted. She is not diaphoretic. No erythema.  Psychiatric: She has a normal mood and affect. Her behavior is normal.   Labs- Lab Results  Component Value Date   HGBA1C 6.7* 07/17/2013   CBC    Component Value Date/Time   WBC 7.2 12/12/2012 0827   WBC 9.1 06/13/2011 0500   WBC 5.3 11/23/2007 1536   RBC 3.74* 12/12/2012 0827   RBC 2.90* 06/13/2011 0500   RBC 3.75 11/23/2007 1536   HGB 11.5 12/12/2012 0827   HGB 10.9* 11/23/2007 1536   HCT 34.6 12/12/2012 0827   HCT 32.0* 11/23/2007 1536   PLT 162 06/13/2011 0500   PLT 288 11/23/2007 1536   MCV 93 12/12/2012 0827   MCV 85.4 11/23/2007 1536   MCH 30.7 12/12/2012 0827   MCH 30.0 06/13/2011 0500   MCH 29.2 11/23/2007 1536   MCHC 33.2 12/12/2012 0827   MCHC 33.7 06/13/2011 0500   MCHC 34.2 11/23/2007 1536   RDW 14.2  12/12/2012 0827   RDW 15.0 06/13/2011 0500   RDW 16.3* 11/23/2007 1536   LYMPHSABS 2.8 12/12/2012 0827   LYMPHSABS 2.8 06/02/2011 1123   LYMPHSABS 1.6 11/23/2007 1536   MONOABS 0.4 06/02/2011 1123   MONOABS 0.4 11/23/2007 1536   EOSABS 0.1 12/12/2012 0827   EOSABS 0.5 06/02/2011 1123   BASOSABS 0.0 12/12/2012 0827   BASOSABS 0.0 06/02/2011 1123   BASOSABS 0.0 11/23/2007 1536    CMP     Component Value Date/Time   NA 141 07/17/2013 0854   NA 134* 02/21/2013 0953   K 3.4* 07/17/2013 0854    CL 98 07/17/2013 0854   CO2 21 07/17/2013 0854   GLUCOSE 58* 07/17/2013 0854   GLUCOSE 191* 02/21/2013 0953   BUN 25 07/17/2013 0854   BUN 32* 02/21/2013 0953   CREATININE 1.29* 07/17/2013 0854   CREATININE 0.82 07/08/2011 1637   CALCIUM 9.5 07/17/2013 0854   PROT 6.8 12/12/2012 0827   PROT 7.4 07/08/2011 1637   ALBUMIN 4.3 07/08/2011 1637   AST 30 12/12/2012 0827   ALT 26 12/12/2012 0827   ALKPHOS 103 12/12/2012 0827   BILITOT 0.3 12/12/2012 0827   GFRNONAA 44* 07/17/2013 0854   GFRAA 51* 07/17/2013 0854    ASSESSMENT/PLAN  1. HYPERTENSION, BENIGN SYSTEMIC Elevated bp readings at home and in offcie. Will increase his lisinopril to 50 mg daily.  Continue coreg and hctz - carvedilol (COREG) 25 MG tablet; Take 1 tablet (25 mg total) by mouth 2 (two) times daily with a meal.  Dispense: 180 tablet; Refill: 3 - Basic Metabolic Panel; Future  2. Cardiomyopathy Continue b blocker and apixaban with ARB  3. Diabetes mellitus due to underlying condition with diabetic nephropathy With improved sugar reading and a1c, will decrease glipizide to 5 mg daily. Continue metformin. Monitor renal function. Normal foot exam and normal urine microalbumin. Continue asa and simvastatin.  - glipiZIDE (GLUCOTROL XL) 5 MG 24 hr tablet; Take 1 tablet (5 mg total) by mouth daily.  Dispense: 90 tablet; Refill: 3 - metFORMIN (GLUCOPHAGE) 1000 MG tablet; Take 1 tablet (1,000 mg total) by mouth 2 (two) times daily with a meal.  Dispense: 180 tablet; Refill: 3 - Hemoglobin A1c; Future  4. OSTEOARTHRITIS, LOWER LEG Continue prn tramadol which has been helpful  5. CKD (chronic kidney disease), stage 2 (mild) Avoid NSAIDs. Monitor sugar, BP and renal function  6. Insomnia Will start her on melatonin 5 mg daily and reassess

## 2013-08-18 ENCOUNTER — Ambulatory Visit (INDEPENDENT_AMBULATORY_CARE_PROVIDER_SITE_OTHER): Payer: Managed Care, Other (non HMO) | Admitting: Internal Medicine

## 2013-08-18 ENCOUNTER — Encounter: Payer: Self-pay | Admitting: Internal Medicine

## 2013-08-18 VITALS — BP 152/90 | HR 72 | Ht 67.0 in | Wt 198.4 lb

## 2013-08-18 DIAGNOSIS — I10 Essential (primary) hypertension: Secondary | ICD-10-CM

## 2013-08-18 DIAGNOSIS — I428 Other cardiomyopathies: Secondary | ICD-10-CM

## 2013-08-18 DIAGNOSIS — I4892 Unspecified atrial flutter: Secondary | ICD-10-CM

## 2013-08-18 NOTE — Assessment & Plan Note (Signed)
Continue apixoban

## 2013-08-18 NOTE — Progress Notes (Signed)
Patient Care Team: Oneal Grout, MD as PCP - General (Internal Medicine)   HPI  [name] is a 62 y.o. female Seen in followup for a diagnosis of atrial flutter/fibrillation in the context of a cardiomyopathy and a prior TIA.    At our last visit in May she was started on apixaban. Echo 6/14   Echo >> normal EF  We reviewed the electrocardiogram that was diagnosed his atrial flutter. It shows sinus rhythm. I have reviewed all ECGs in epic. There is no evidence of atrial arrhythmia.   The patient denies chest pain, shortness of breath, nocturnal dyspnea, orthopnea or peripheral edema.  There have been no palpitations, lightheadedness or syncope.     Catheterization 2001 demonstrated ejection fraction of 40%1; repeat assessment in 2006 by nuclear study had an ejection fraction of 48% By 2009 ejection fraction had normalized by echocardiogram.    She works at night  And loves to crochet  And knit   Past Medical History  Diagnosis Date  . Hypertension   . Arthritis     osteoarthritis  . Diabetes mellitus     TYPE 2  . Breast cancer 2008    right, ER/PR -  . Hx of radiation therapy 04/04/07 to 05/20/07    right breast/6260 cGy  . Hypercholesterolemia   . CHF (congestive heart failure)   . Gastroparesis   . Nonspecific elevation of levels of transaminase or lactic acid dehydrogenase (LDH)   . Regional enteritis of unspecified site   . Restless legs syndrome (RLS)   . Vitamin deficiency   . Depression   . Anxiety   . Malignant neoplasm of breast (female), unspecified site   . Uterine prolapse without mention of vaginal wall prolapse   . Osteoarthrosis, unspecified whether generalized or localized, unspecified site   . Lumbago   . Urinary frequency     Past Surgical History  Procedure Laterality Date  . Breast lumpectomy Right 11/17/06    re-excision 01/13/07  . Arthroplasty      left knee  . Wrist surgery      carpel tunnel  . Total knee arthroplasty  Left 06/10/2011       . Abdominal hysterectomy  1997    TAH/BSO, ENDOMETRIAL SARCOMA  . Total knee arthroplasty Right 2006, 2009    Dr Wyline Mood  . Total knee arthroplasty Left 2008    Dr Wyline Mood  . Radical hysterectomy  1997    Current Outpatient Prescriptions  Medication Sig Dispense Refill  . amLODipine (NORVASC) 10 MG tablet Take 1/2 tablet by mouth once daily      . apixaban (ELIQUIS) 5 MG TABS tablet Take 1 tablet (5 mg total) by mouth 2 (two) times daily.  60 tablet  11  . atorvastatin (LIPITOR) 40 MG tablet TAKE 1 TABLET DAILY FOR CHOLESTEROL  90 tablet  3  . Blood Glucose Monitoring Suppl (ONE TOUCH TEST STRIP HOLDER) MISC as directed.        . carvedilol (COREG) 25 MG tablet Take 1 tablet (25 mg total) by mouth 2 (two) times daily with a meal.  180 tablet  3  . Cyanocobalamin (VITAMIN B-12) 1000 MCG SUBL Place 1 tablet (1,000 mcg total) under the tongue daily.  30 each  11  . EPINEPHrine (EPIPEN) 0.3 mg/0.3 mL DEVI Inject 0.3 mLs (0.3 mg total) into the muscle once.  1 Device  0  . glipiZIDE (GLUCOTROL XL) 5 MG 24 hr tablet Take 1 tablet (  5 mg total) by mouth daily.  90 tablet  3  . hydrochlorothiazide (HYDRODIURIL) 25 MG tablet TAKE 1 TABLET BY MOUTH DAILY FOR BLOOD PRESSURE  90 tablet  3  . losartan (COZAAR) 50 MG tablet Take 1 tablet (50 mg total) by mouth daily.  90 tablet  3  . Melatonin 5 MG CAPS Take 1 capsule (5 mg total) by mouth daily.  30 capsule  3  . metFORMIN (GLUCOPHAGE) 1000 MG tablet Take 1 tablet (1,000 mg total) by mouth 2 (two) times daily with a meal.  180 tablet  3  . multivitamin (THERAGRAN) per tablet Take 1 tablet by mouth daily.  30 tablet  11  . rOPINIRole (REQUIP) 0.25 MG tablet As directed      . traMADol (ULTRAM) 50 MG tablet TAKE 1 TABLET BY MOUTH TWICE DAILY AS NEEDED FOR PAIN  180 tablet  1  . zoster vaccine live, PF, (ZOSTAVAX) 96045 UNT/0.65ML injection Inject 19,400 Units into the skin once.  1 each  0  . glucose blood (ONE TOUCH TEST STRIPS) test  strip Use as instructed- supply enough for twice daily testing.  100 each  12   No current facility-administered medications for this visit.    Allergies  Allergen Reactions  . Hydrocodone Nausea And Vomiting  . Lisinopril     Review of Systems negative except from HPI and PMH  Physical Exam BP 152/90  Pulse 72  Ht 5\' 7"  (1.702 m)  Wt 198 lb 6.4 oz (89.994 kg)  BMI 31.07 kg/m2 Well developed and well nourished in no acute distress HENT normal E scleral and icterus clear Neck Supple JVP flat; carotids brisk and full Clear to ausculation Regular rate and rhythm, no murmurs gallops or rub Soft with active bowel sounds No clubbing cyanosis none Edema Alert and oriented, grossly normal motor and sensory function Skin Warm and Dry  NSR  septal mi   Assessment and  Plan

## 2013-08-18 NOTE — Patient Instructions (Signed)
Your physician recommends that you continue on your current medications as directed. Please refer to the Current Medication list given to you today.  Please increase your Losartan 2 weeks before you see your PCP, per Dr. Graciela Husbands.  Your physician wants you to follow-up in: 1 year with Dr. Graciela Husbands.  You will receive a reminder letter in the mail two months in advance. If you don't receive a letter, please call our office to schedule the follow-up appointment.

## 2013-08-18 NOTE — Assessment & Plan Note (Signed)
Continue current meds 

## 2013-08-18 NOTE — Assessment & Plan Note (Signed)
BP is mildly elevated we will have her increase her losartan 50--102 weeks before she sees Dr. Annice Pih

## 2013-09-27 ENCOUNTER — Telehealth: Payer: Self-pay | Admitting: Internal Medicine

## 2013-09-27 NOTE — Telephone Encounter (Signed)
New problem:  Pt is requesting a cheaper option for her medication elliquis... Pt states it is too expensive.. Pt would like to know what other medications she can try. Pt would like a call back from the nurse.

## 2013-09-28 NOTE — Telephone Encounter (Signed)
Pt explains that she has already contacted manufacturer and they are giving her the medication for $10 a month for next year. (this is what I was going to advise her to do).

## 2013-10-30 ENCOUNTER — Other Ambulatory Visit: Payer: Self-pay | Admitting: Internal Medicine

## 2013-11-03 ENCOUNTER — Other Ambulatory Visit: Payer: Self-pay | Admitting: Internal Medicine

## 2013-11-03 DIAGNOSIS — Z853 Personal history of malignant neoplasm of breast: Secondary | ICD-10-CM

## 2013-11-03 DIAGNOSIS — Z9889 Other specified postprocedural states: Secondary | ICD-10-CM

## 2013-11-27 ENCOUNTER — Other Ambulatory Visit: Payer: BC Managed Care – PPO

## 2013-11-27 DIAGNOSIS — I10 Essential (primary) hypertension: Secondary | ICD-10-CM

## 2013-11-27 DIAGNOSIS — E119 Type 2 diabetes mellitus without complications: Secondary | ICD-10-CM

## 2013-11-28 LAB — HEMOGLOBIN A1C
Est. average glucose Bld gHb Est-mCnc: 151 mg/dL
Hgb A1c MFr Bld: 6.9 % — ABNORMAL HIGH (ref 4.8–5.6)

## 2013-11-28 LAB — BASIC METABOLIC PANEL
BUN / CREAT RATIO: 20 (ref 11–26)
BUN: 26 mg/dL (ref 8–27)
CHLORIDE: 99 mmol/L (ref 97–108)
CO2: 23 mmol/L (ref 18–29)
CREATININE: 1.29 mg/dL — AB (ref 0.57–1.00)
Calcium: 9.3 mg/dL (ref 8.7–10.3)
GFR calc non Af Amer: 44 mL/min/{1.73_m2} — ABNORMAL LOW (ref >59)
GFR, EST AFRICAN AMERICAN: 51 mL/min/{1.73_m2} — AB (ref >59)
Glucose: 61 mg/dL — ABNORMAL LOW (ref 65–99)
Potassium: 3.6 mmol/L (ref 3.5–5.2)
Sodium: 140 mmol/L (ref 134–144)

## 2013-11-29 ENCOUNTER — Encounter: Payer: Self-pay | Admitting: Internal Medicine

## 2013-11-29 ENCOUNTER — Ambulatory Visit (INDEPENDENT_AMBULATORY_CARE_PROVIDER_SITE_OTHER): Payer: BC Managed Care – PPO | Admitting: Internal Medicine

## 2013-11-29 VITALS — BP 134/76 | HR 76 | Wt 195.0 lb

## 2013-11-29 DIAGNOSIS — G4700 Insomnia, unspecified: Secondary | ICD-10-CM

## 2013-11-29 DIAGNOSIS — E0821 Diabetes mellitus due to underlying condition with diabetic nephropathy: Secondary | ICD-10-CM

## 2013-11-29 DIAGNOSIS — E785 Hyperlipidemia, unspecified: Secondary | ICD-10-CM | POA: Insufficient documentation

## 2013-11-29 DIAGNOSIS — G2581 Restless legs syndrome: Secondary | ICD-10-CM | POA: Insufficient documentation

## 2013-11-29 DIAGNOSIS — Z23 Encounter for immunization: Secondary | ICD-10-CM

## 2013-11-29 DIAGNOSIS — N058 Unspecified nephritic syndrome with other morphologic changes: Secondary | ICD-10-CM

## 2013-11-29 DIAGNOSIS — I10 Essential (primary) hypertension: Secondary | ICD-10-CM

## 2013-11-29 DIAGNOSIS — E1329 Other specified diabetes mellitus with other diabetic kidney complication: Secondary | ICD-10-CM

## 2013-11-29 DIAGNOSIS — M1990 Unspecified osteoarthritis, unspecified site: Secondary | ICD-10-CM

## 2013-11-29 MED ORDER — CLONIDINE HCL 0.1 MG PO TABS: 0.1000 mg | ORAL_TABLET | Freq: Every day | ORAL | Status: DC | PRN

## 2013-11-29 NOTE — Progress Notes (Signed)
Patient ID: [name], female   DOB: [birth year], 63 y.o.   MRN: [id number]    Chief Complaint  Patient presents with  . Medical Managment of Chronic Issues    3 month follow-up    Allergies  Allergen Reactions  . Hydrocodone Nausea And Vomiting  . Lisinopril    HPI 63 y/o female patient is here for routine follow up. She was under stress recently with her receiving a call from her insurance about paying $ 400 a month for her eliquis and her car breaking down. During this period her bp was running 160-200/ 90-110. Now she was informed that her eliquis will come out to be $10 a month and her car has been fixed. She has been careful with her diet recently. No exercises.Compliant with her other medications. Labs reviewed. No other concerns. No hypoglycemic episodes     Review of Systems:  Constitutional: Negative for fever, chills, weight loss, malaise/fatigue and diaphoresis.  HENT: Negative for congestion, hearing loss and sore throat.   Eyes: Negative for blurred vision, double vision and discharge.  Respiratory: Negative for cough, sputum production, shortness of breath and wheezing.   Cardiovascular: Negative for chest pain, palpitations, orthopnea and leg swelling.  Gastrointestinal: Negative for heartburn, nausea, vomiting, abdominal pain, diarrhea and constipation.  Genitourinary: Negative for dysuria, urgency, frequency and flank pain.  Musculoskeletal: Negative for back pain, falls, joint pain and myalgias.  Skin: Negative for itching and rash.  Neurological: Negative for dizziness, tingling, focal weakness and headaches.  Psychiatric/Behavioral: Negative for depression and memory loss. The patient is not nervous/anxious.    Past Medical History  Diagnosis Date  . Hypertension   . Arthritis     osteoarthritis  . Diabetes mellitus     TYPE 2  . Breast cancer 2008    right, ER/PR -  . Hx of radiation therapy 04/04/07 to 05/20/07    right breast/6260 cGy  .  Hypercholesterolemia   . CHF (congestive heart failure)   . Gastroparesis   . Nonspecific elevation of levels of transaminase or lactic acid dehydrogenase (LDH)   . Regional enteritis of unspecified site   . Restless legs syndrome (RLS)   . Vitamin deficiency   . Depression   . Anxiety   . Malignant neoplasm of breast (female), unspecified site   . Uterine prolapse without mention of vaginal wall prolapse   . Osteoarthrosis, unspecified whether generalized or localized, unspecified site   . Lumbago   . Urinary frequency    Past Surgical History  Procedure Laterality Date  . Breast lumpectomy Right 11/17/06    re-excision 01/13/07  . Arthroplasty      left knee  . Wrist surgery      carpel tunnel  . Total knee arthroplasty Left 06/10/2011       . Abdominal hysterectomy  1997    TAH/BSO, ENDOMETRIAL SARCOMA  . Total knee arthroplasty Right 2006, 2009    Dr Para March  . Total knee arthroplasty Left 2008    Dr Para March  . Radical hysterectomy  1997   Current Outpatient Prescriptions on File Prior to Visit  Medication Sig Dispense Refill  . amLODipine (NORVASC) 10 MG tablet Take 1/2 tablet by mouth once daily      . apixaban (ELIQUIS) 5 MG TABS tablet Take 1 tablet (5 mg total) by mouth 2 (two) times daily.  60 tablet  11  . atorvastatin (LIPITOR) 40 MG tablet TAKE 1 TABLET DAILY FOR CHOLESTEROL  90 tablet  3  . Blood Glucose Monitoring Suppl (ONE TOUCH TEST STRIP HOLDER) MISC as directed.        . carvedilol (COREG) 25 MG tablet Take 1 tablet (25 mg total) by mouth 2 (two) times daily with a meal.  180 tablet  3  . Cyanocobalamin (VITAMIN B-12) 1000 MCG SUBL Place 1 tablet (1,000 mcg total) under the tongue daily.  30 each  11  . EPINEPHrine (EPIPEN) 0.3 mg/0.3 mL DEVI Inject 0.3 mLs (0.3 mg total) into the muscle once.  1 Device  0  . glipiZIDE (GLUCOTROL XL) 5 MG 24 hr tablet Take 1 tablet (5 mg total) by mouth daily.  90 tablet  3  . hydrochlorothiazide (HYDRODIURIL) 25 MG tablet  TAKE 1 TABLET BY MOUTH DAILY FOR BLOOD PRESSURE  90 tablet  3  . losartan (COZAAR) 50 MG tablet Take 1 tablet (50 mg total) by mouth daily.  90 tablet  3  . Melatonin 5 MG CAPS Take 1 capsule (5 mg total) by mouth daily.  30 capsule  3  . metFORMIN (GLUCOPHAGE) 1000 MG tablet Take 1 tablet (1,000 mg total) by mouth 2 (two) times daily with a meal.  180 tablet  3  . multivitamin (THERAGRAN) per tablet Take 1 tablet by mouth daily.  30 tablet  11  . rOPINIRole (REQUIP) 0.25 MG tablet TAKE 1 TABLET BY MOUTH 2 TIMES DAILY FOR RESTLESS LEGS SYNDROME  60 tablet  3  . traMADol (ULTRAM) 50 MG tablet TAKE 1 TABLET BY MOUTH TWICE DAILY AS NEEDED FOR PAIN  180 tablet  1  . glucose blood (ONE TOUCH TEST STRIPS) test strip Use as instructed- supply enough for twice daily testing.  100 each  12   No current facility-administered medications on file prior to visit.    Physical exam BP 134/76  Pulse 76  Wt 195 lb (88.451 kg)  SpO2 96%  Constitutional: She is oriented to person, place, and time. She appears well-developed and well-nourished. No distress.   HENT:   Head: Normocephalic and atraumatic.   Mouth/Throat: Oropharynx is clear and moist.   Eyes: Conjunctivae are normal. Pupils are equal, round, and reactive to light.   Neck: Normal range of motion. Neck supple. No JVD present.   Cardiovascular: Normal rate, regular rhythm and normal heart sounds.    No murmur heard. Pulmonary/Chest: Effort normal and breath sounds normal. No respiratory distress. She has no wheezes. She exhibits no tenderness.   Abdominal: Soft. Bowel sounds are normal. She exhibits no distension and no mass. There is no tenderness.  Musculoskeletal: Normal range of motion. She exhibits no edema and no tenderness.  Lymphadenopathy:    She has no cervical adenopathy.  Neurological: She is alert and oriented to person, place, and time. No cranial nerve deficit.   Skin: Skin is warm and dry. No rash noted. She is not diaphoretic.  No erythema.  Psychiatric: She has a normal mood and affect. Her behavior is normal.   Labs-  Lab Results  Component Value Date   HGBA1C 6.9* 11/27/2013   CMP     Component Value Date/Time   NA 140 11/27/2013 0823   NA 134* 02/21/2013 0953   K 3.6 11/27/2013 0823   CL 99 11/27/2013 0823   CO2 23 11/27/2013 0823   GLUCOSE 61* 11/27/2013 0823   GLUCOSE 191* 02/21/2013 0953   BUN 26 11/27/2013 0823   BUN 32* 02/21/2013 0953   CREATININE 1.29* 11/27/2013 0823   CREATININE 0.82 07/08/2011  1637   CALCIUM 9.3 11/27/2013 0823   PROT 6.8 12/12/2012 0827   PROT 7.4 07/08/2011 1637   ALBUMIN 4.3 07/08/2011 1637   AST 30 12/12/2012 0827   ALT 26 12/12/2012 0827   ALKPHOS 103 12/12/2012 0827   BILITOT 0.3 12/12/2012 0827   GFRNONAA 44* 11/27/2013 0823   GFRAA 51* 11/27/2013 0823   cbg- home readings:  in 150, lowest is 98, highest 170  Assessment/plan  1. Need for prophylactic vaccination with combined diphtheria-tetanus-pertussis (DTP) vaccine Script provided - Tdap vaccine greater than or equal to 7yo IM  2. HYPERTENSION, BENIGN SYSTEMIC Well controlled bp in office. Also checked bp reading today with her home machine and provides same reading as office. Continue lisinopril 50 mg daily, HCTZ 25 mg daily and coreg 25 mg bid for now. Monitor renal function - CMP; Future  3. Diabetes mellitus due to underlying condition with diabetic nephropathy Slight increase in average cbg reading per a1c. Continue metformin 1000 mg bid and glipizide 5 mg daily. Reviewed renal function. If cr > 1.4, consider switching metformin. If a1c on next reading is less than 7, consider decreasing metformin dosing. Check urine microalbumin and foot exam next visit. Continue lipitor and losartan - Hemoglobin A1c; Future - CMP; Future - CBC with Differential; Future  4. Insomnia Melatonin has been helpful. Continue this  5. Restless leg syndrome Continue requip, pt finds this helpful  6. Other and unspecified  hyperlipidemia Check lipid next visit. Continue lipitor and dietary modification for now - Lipid Panel; Future  7. Osteoarthritis Continue prn tramadol. Check vitamin d level - Vitamin D, 1,25-dihydroxy; Future

## 2013-12-04 ENCOUNTER — Ambulatory Visit
Admission: RE | Admit: 2013-12-04 | Discharge: 2013-12-04 | Disposition: A | Discharge status: Home / Self Care | Payer: BC Managed Care – PPO | Source: Ambulatory Visit | Attending: Internal Medicine | Admitting: Internal Medicine

## 2013-12-04 DIAGNOSIS — Z853 Personal history of malignant neoplasm of breast: Secondary | ICD-10-CM

## 2013-12-04 DIAGNOSIS — Z9889 Other specified postprocedural states: Secondary | ICD-10-CM

## 2014-02-25 ENCOUNTER — Other Ambulatory Visit: Payer: Self-pay | Admitting: Internal Medicine

## 2014-03-05 ENCOUNTER — Other Ambulatory Visit: Payer: BC Managed Care – PPO

## 2014-03-05 DIAGNOSIS — I10 Essential (primary) hypertension: Secondary | ICD-10-CM

## 2014-03-05 DIAGNOSIS — E785 Hyperlipidemia, unspecified: Secondary | ICD-10-CM

## 2014-03-05 DIAGNOSIS — M1990 Unspecified osteoarthritis, unspecified site: Secondary | ICD-10-CM

## 2014-03-05 DIAGNOSIS — E0821 Diabetes mellitus due to underlying condition with diabetic nephropathy: Secondary | ICD-10-CM

## 2014-03-06 LAB — LIPID PANEL
CHOL/HDL RATIO: 1.9 ratio (ref 0.0–4.4)
Cholesterol, Total: 178 mg/dL (ref 100–199)
HDL: 96 mg/dL (ref >39)
LDL CALC: 70 mg/dL (ref 0–99)
TRIGLYCERIDES: 58 mg/dL (ref 0–149)
VLDL CHOLESTEROL CAL: 12 mg/dL (ref 5–40)

## 2014-03-06 LAB — CBC WITH DIFFERENTIAL/PLATELET
Basophils Absolute: 0 10*3/uL (ref 0.0–0.2)
Basos: 0 %
EOS: 2 %
Eosinophils Absolute: 0.1 10*3/uL (ref 0.0–0.4)
HEMATOCRIT: 31.3 % — AB (ref 34.0–46.6)
Hemoglobin: 10.5 g/dL — ABNORMAL LOW (ref 11.1–15.9)
IMMATURE GRANS (ABS): 0 10*3/uL (ref 0.0–0.1)
Immature Granulocytes: 0 %
LYMPHS: 45 %
Lymphocytes Absolute: 2.7 10*3/uL (ref 0.7–3.1)
MCH: 30.7 pg (ref 26.6–33.0)
MCHC: 33.5 g/dL (ref 31.5–35.7)
MCV: 92 fL (ref 79–97)
Monocytes Absolute: 0.5 10*3/uL (ref 0.1–0.9)
Monocytes: 8 %
NEUTROS PCT: 45 %
Neutrophils Absolute: 2.6 10*3/uL (ref 1.4–7.0)
RBC: 3.42 x10E6/uL — ABNORMAL LOW (ref 3.77–5.28)
RDW: 15.2 % (ref 12.3–15.4)
WBC: 5.9 10*3/uL (ref 3.4–10.8)

## 2014-03-06 LAB — COMPREHENSIVE METABOLIC PANEL
A/G RATIO: 1.6 (ref 1.1–2.5)
ALBUMIN: 3.9 g/dL (ref 3.6–4.8)
ALT: 31 IU/L (ref 0–32)
AST: 39 IU/L (ref 0–40)
Alkaline Phosphatase: 88 IU/L (ref 39–117)
BUN/Creatinine Ratio: 19 (ref 11–26)
BUN: 22 mg/dL (ref 8–27)
CALCIUM: 9.5 mg/dL (ref 8.7–10.3)
CO2: 22 mmol/L (ref 18–29)
CREATININE: 1.14 mg/dL — AB (ref 0.57–1.00)
Chloride: 104 mmol/L (ref 97–108)
GFR calc Af Amer: 58 mL/min/{1.73_m2} — ABNORMAL LOW (ref >59)
GFR calc non Af Amer: 51 mL/min/{1.73_m2} — ABNORMAL LOW (ref >59)
GLOBULIN, TOTAL: 2.4 g/dL (ref 1.5–4.5)
Glucose: 53 mg/dL — ABNORMAL LOW (ref 65–99)
Potassium: 3.8 mmol/L (ref 3.5–5.2)
Sodium: 142 mmol/L (ref 134–144)
Total Bilirubin: 0.3 mg/dL (ref 0.0–1.2)
Total Protein: 6.3 g/dL (ref 6.0–8.5)

## 2014-03-06 LAB — VITAMIN D 1,25 DIHYDROXY: VIT D 1 25 DIHYDROXY: 46.3 pg/mL (ref 19.9–79.3)

## 2014-03-06 LAB — HEMOGLOBIN A1C
ESTIMATED AVERAGE GLUCOSE: 137 mg/dL
Hgb A1c MFr Bld: 6.4 % — ABNORMAL HIGH (ref 4.8–5.6)

## 2014-03-07 ENCOUNTER — Encounter: Payer: Self-pay | Admitting: Internal Medicine

## 2014-03-07 ENCOUNTER — Ambulatory Visit (INDEPENDENT_AMBULATORY_CARE_PROVIDER_SITE_OTHER): Payer: BC Managed Care – PPO | Admitting: Internal Medicine

## 2014-03-07 VITALS — BP 140/80 | HR 90 | Temp 97.5°F | Resp 10 | Ht 66.5 in | Wt 199.0 lb

## 2014-03-07 DIAGNOSIS — E7800 Pure hypercholesterolemia, unspecified: Secondary | ICD-10-CM

## 2014-03-07 DIAGNOSIS — IMO0002 Reserved for concepts with insufficient information to code with codable children: Secondary | ICD-10-CM

## 2014-03-07 DIAGNOSIS — E0821 Diabetes mellitus due to underlying condition with diabetic nephropathy: Secondary | ICD-10-CM

## 2014-03-07 DIAGNOSIS — N183 Chronic kidney disease, stage 3 unspecified: Secondary | ICD-10-CM | POA: Insufficient documentation

## 2014-03-07 DIAGNOSIS — N058 Unspecified nephritic syndrome with other morphologic changes: Secondary | ICD-10-CM

## 2014-03-07 DIAGNOSIS — E2839 Other primary ovarian failure: Secondary | ICD-10-CM

## 2014-03-07 DIAGNOSIS — G4700 Insomnia, unspecified: Secondary | ICD-10-CM

## 2014-03-07 DIAGNOSIS — G2581 Restless legs syndrome: Secondary | ICD-10-CM

## 2014-03-07 DIAGNOSIS — Z5181 Encounter for therapeutic drug level monitoring: Secondary | ICD-10-CM

## 2014-03-07 DIAGNOSIS — Z1211 Encounter for screening for malignant neoplasm of colon: Secondary | ICD-10-CM

## 2014-03-07 DIAGNOSIS — M1710 Unilateral primary osteoarthritis, unspecified knee: Secondary | ICD-10-CM

## 2014-03-07 DIAGNOSIS — I10 Essential (primary) hypertension: Secondary | ICD-10-CM

## 2014-03-07 DIAGNOSIS — E1129 Type 2 diabetes mellitus with other diabetic kidney complication: Secondary | ICD-10-CM

## 2014-03-07 DIAGNOSIS — E1122 Type 2 diabetes mellitus with diabetic chronic kidney disease: Secondary | ICD-10-CM

## 2014-03-07 DIAGNOSIS — Z0000 Encounter for general adult medical examination without abnormal findings: Secondary | ICD-10-CM

## 2014-03-07 DIAGNOSIS — E1329 Other specified diabetes mellitus with other diabetic kidney complication: Secondary | ICD-10-CM

## 2014-03-07 DIAGNOSIS — Z23 Encounter for immunization: Secondary | ICD-10-CM

## 2014-03-07 MED ORDER — HYDROCHLOROTHIAZIDE 25 MG PO TABS: ORAL_TABLET | ORAL | Status: DC

## 2014-03-07 NOTE — Patient Instructions (Signed)
Stop taking your GLIPIZIDE  Cut your hydrochlorthiazide into half and take half a tablet daily for now  Check blood pressure while on reduced dose of medication and bring reading to office for review on next appointment

## 2014-03-07 NOTE — Progress Notes (Signed)
Patient ID: [name], female   DOB: [birth year], 63 y.o.   MRN: [id number]      Allergies  Allergen Reactions  . Hydrocodone Nausea And Vomiting  . Lisinopril     Chief Complaint  Patient presents with  . Annual Exam    Yearly check-up, EKG is up-to-date, Pap completed by GYN   . Referral    Colonoscopy- [facility], Bone Density referral (ALL appointments to be scheduled for Friday's)     HPI:  63 y/o female patient is here for her annual exam. She has history of hypertension, gastroparesis, atrial flutter, DM, OA, hyperlipidemia and CKD She had mammogram 12/04/13 was normal and pap smear is pending in aug 2015 with her gyn Never had colonoscopy. Denies melena, rectal bleed, constipation Never had dexa scan before Quit smoking 20 years back She has been complaint with her medications Did not check her cbg for couple of days at present- last checked a week and a half back, cbg in am was 130. Most of her cbgs has been less than 200. No hypoglycemic symptom or readings reported  Review of Systems:  Constitutional: Negative for fever, chills, weight loss, malaise/fatigue and diaphoresis.  HENT: Negative for congestion, hearing loss and sore throat.   Eyes: Negative for eye pain, blurred vision, double vision and discharge. diabetic eye exam 12/09/13 Respiratory: Negative for cough, sputum production, shortness of breath and wheezing.   Cardiovascular: Negative for chest pain, palpitations, orthopnea. Continues to have leg swelling towards end of the day but denies claudication  Gastrointestinal: Negative for heartburn, nausea, vomiting, abdominal pain, diarrhea and constipation.  Genitourinary: Negative for dysuria, urgency, frequency, hematuria and flank pain.  Musculoskeletal: Negative for back pain, falls, joint pain and myalgias.  Skin: Negative for itching and rash.  Neurological: Negative for weakness,dizziness, tingling, focal weakness and headaches.    Psychiatric/Behavioral: Negative for depression and memory loss. The patient is not nervous/anxious. Unable to sleep for more than 3 hours at a stretch    Wt Readings from Last 3 Encounters:  03/07/14 199 lb (90.266 kg)  11/29/13 195 lb (88.451 kg)  08/18/13 198 lb 6.4 oz (89.994 kg)    Past Medical History  Diagnosis Date  . Hypertension   . Arthritis     osteoarthritis  . Diabetes mellitus     TYPE 2  . Breast cancer 2008    right, ER/PR -  . Hx of radiation therapy 04/04/07 to 05/20/07    right breast/6260 cGy  . Hypercholesterolemia   . CHF (congestive heart failure)   . Gastroparesis   . Nonspecific elevation of levels of transaminase or lactic acid dehydrogenase (LDH)   . Regional enteritis of unspecified site   . Restless legs syndrome (RLS)   . Vitamin deficiency   . Depression   . Anxiety   . Malignant neoplasm of breast (female), unspecified site   . Uterine prolapse without mention of vaginal wall prolapse   . Osteoarthrosis, unspecified whether generalized or localized, unspecified site   . Lumbago   . Urinary frequency    Past Surgical History  Procedure Laterality Date  . Breast lumpectomy Right 11/17/06    re-excision 01/13/07  . Arthroplasty      left knee  . Wrist surgery      carpel tunnel  . Total knee arthroplasty Left 06/10/2011       . Abdominal hysterectomy  1997    TAH/BSO, ENDOMETRIAL SARCOMA  . Total knee arthroplasty Right 2006, 2009  Dr Para March  . Total knee arthroplasty Left 2008    Dr Para March  . Radical hysterectomy  1997   Social History:   reports that she quit smoking about 22 years ago. She has never used smokeless tobacco. She reports that she does not drink alcohol or use illicit drugs.  Family History  Problem Relation Age of Onset  . Diabetes Mother   . Diabetes Father   . Aneurysm Sister   . Arthritis Sister   . Diabetes Brother   . Heart Problems Brother     Medications: Patient's Medications  New Prescriptions    No medications on file  Previous Medications   AMLODIPINE (NORVASC) 10 MG TABLET    Take 1/2 tablet by mouth once daily   ATORVASTATIN (LIPITOR) 40 MG TABLET    TAKE 1 TABLET DAILY FOR CHOLESTEROL   BLOOD GLUCOSE MONITORING SUPPL (ONE TOUCH TEST STRIP HOLDER) MISC    as directed.     CARVEDILOL (COREG) 25 MG TABLET    Take 1 tablet (25 mg total) by mouth 2 (two) times daily with a meal.   CLONIDINE (CATAPRES) 0.1 MG TABLET    Take 1 tablet (0.1 mg total) by mouth daily as needed. Only if blood pressure is greater than 160/100   CYANOCOBALAMIN (VITAMIN B-12) 1000 MCG SUBL    Place 1 tablet (1,000 mcg total) under the tongue daily.   ELIQUIS 5 MG TABS TABLET    TAKE 1 TABLET BY MOUTH TWICE A DAY   EPINEPHRINE (EPIPEN) 0.3 MG/0.3 ML DEVI    Inject 0.3 mLs (0.3 mg total) into the muscle once.   GLIPIZIDE (GLUCOTROL XL) 5 MG 24 HR TABLET    Take 1 tablet (5 mg total) by mouth daily.   GLUCOSE BLOOD (ONE TOUCH TEST STRIPS) TEST STRIP    Use as instructed- supply enough for twice daily testing.   HYDROCHLOROTHIAZIDE (HYDRODIURIL) 25 MG TABLET    TAKE 1 TABLET BY MOUTH DAILY FOR BLOOD PRESSURE   LOSARTAN (COZAAR) 50 MG TABLET    Take 1 tablet (50 mg total) by mouth daily.   MELATONIN 5 MG CAPS    Take 1 capsule (5 mg total) by mouth daily.   METFORMIN (GLUCOPHAGE) 1000 MG TABLET    Take 1 tablet (1,000 mg total) by mouth 2 (two) times daily with a meal.   MULTIVITAMIN (THERAGRAN) PER TABLET    Take 1 tablet by mouth daily.   ROPINIROLE (REQUIP) 0.25 MG TABLET    TAKE 1 TABLET BY MOUTH 2 TIMES DAILY FOR RESTLESS LEGS SYNDROME   TRAMADOL (ULTRAM) 50 MG TABLET    TAKE 1 TABLET BY MOUTH TWICE DAILY AS NEEDED FOR PAIN  Modified Medications   No medications on file  Discontinued Medications   TDAP (BOOSTRIX) 5-2.5-18.5 LF-MCG/0.5 INJECTION    Inject 0.5 mLs into the muscle once.     Physical Exam:  Filed Vitals:   03/07/14 0819  BP: 140/80  Pulse: 90  Temp: 97.5 F (36.4 C)  TempSrc: Oral  Resp:  10  Height: 5' 6.5" (1.689 m)  Weight: 199 lb (90.266 kg)  SpO2: 98%    General- elderly female in no acute distress Head- atraumatic, normocephalic Eyes- PERRLA, EOMI, no pallor, no icterus, no discharge Neck- no lymphadenopathy, no thyromegaly, no jugular vein distension, no carotid bruit Ears- left ear normal tympanic membrane and normal external ear canal , right ear normal tympanic membrane and normal external ear canal Throat- moist mucus membrane, normal oropharynx Nose-  normal nasal mucosa, no maxillary or frontal sinus tenderness Chest- no chest wall deformities, no chest wall tenderness Breast- no masses, no palpable lumps, normal nipple and areola exam, no axillary lymphadenopathy Cardiovascular- normal s1,s2, no murmurs/ rubs/ gallops, intact distal pulses Respiratory- bilateral clear to auscultation, no wheeze, no rhonchi, no crackles, no use of accessory muscles Abdomen- bowel sounds present, soft, non tender, no organomegaly, no abdominal bruits, no guarding or rigidity, no CVA tenderness Pelvic exam- with obgyn Musculoskeletal- able to move all 4 extremities, no spinal and paraspinal tenderness, steady gait, no use of assistive device, normal range of motion, no leg edema Neurological- no focal deficit, normal reflexes, normal muscle strength, normal sensation to fine touch and vibration Skin- warm and dry, varicose veins Psychiatry- alert and oriented to person, place and time, normal mood and affect    Labs reviewed: Basic Metabolic Panel:  Recent Labs  07/17/13 0854 11/27/13 0823 03/05/14 0809  NA 141 140 142  K 3.4* 3.6 3.8  CL 98 99 104  CO2 21 23 22   GLUCOSE 58* 61* 53*  BUN 25 26 22   CREATININE 1.29* 1.29* 1.14*  CALCIUM 9.5 9.3 9.5   Liver Function Tests:  Recent Labs  03/05/14 0809  AST 39  ALT 31  ALKPHOS 88  BILITOT 0.3  PROT 6.3   CBC:  Recent Labs  03/05/14 0809  WBC 5.9  NEUTROABS 2.6  HGB 10.5*  HCT 31.3*  MCV 92   Lipid  Panel     Component Value Date/Time   CHOL 226* 05/20/2011 0947   TRIG 58 03/05/2014 0809   HDL 96 03/05/2014 0809   HDL 82 05/20/2011 0947   CHOLHDL 1.9 03/05/2014 0809   CHOLHDL 2.8 05/20/2011 0947   VLDL 15 05/20/2011 0947   LDLCALC 70 03/05/2014 0809   LDLCALC 129* 05/20/2011 0947   Lab Results  Component Value Date   HGBA1C 6.4* 03/05/2014   Normal vitamin d level  Immunization History  Administered Date(s) Administered  . Influenza-Unspecified 07/05/2013  . Pneumococcal Polysaccharide-23 06/08/1999  . Td 09/08/2003  . Tdap 11/29/2013  . Zoster 08/08/2013   Assessment/Plan  1. Encounter for therapeutic drug monitoring Genetic swab done to check efficacy and interaction of her medications  2. Routine general medical examination at a health care facility the patient was counseled regarding the appropriate use of alcohol, regular self-examination of the breasts on a monthly basis, prevention of dental and periodontal disease, diet, regular sustained exercise for at least 30 minutes 5 times per week, routine screening interval for mammogram as recommended by the [place] and ACOG, the proper use of sunscreen and protective clothing, tobacco use, and recommended schedule for GI hemoccult testing, colonoscopy, cholesterol, thyroid and diabetes screening. prevnar immunization provided FOBT card provided. Reviewed recent ekg. Labs reviewed. dexa scan ordered. Does not have a living will and POA. Patient to work on her paper work  3. HYPERTENSION, BENIGN SYSTEMIC Stable, impeoved infact. Has not required her prn clonidine at all. With her impaired renal function, will decrease her HCTZ to 12.5 mg daily for now. Check bp at home. Reassess in 2 months. Warning signs with elevated bp provided. Continue losartan and coreg and amlodipine at current dose. On eliquis, won't add aspirin as was taen off by cardiology with increased risk for bleeding  4. Diabetes mellitus due to  underlying condition with diabetic nephropathy Reviewed a1c 6.4 from 6.9 and 7.4. Will discontinue glipizide for now. Continue metformin and monitor cbg. uptodate with  eye exam. Continue lipitor and losartan.  - Microalbumin/Creatinine Ratio, Urine  5. OSTEOARTHRITIS, LOWER LEG Continue prn tramadol. Check dexa scan. Normal vit d level  6. CKD stage 3 due to type 2 diabetes mellitus Avoid NSAIDs. Continue losartan for renal protection  7. Estrogen deficiency - [facility] Density; Future  8. HYPERCHOLESTEROLEMIA Continue lipitor 40 mg daily for now  9. Insomnia Melatonin not working. She mentions plain tea helps her relax before going to bed. i want to avoid medicated sleeping aids given she works at night  10. Restless leg syndrome requip helping at present, continue this  Blanchie Serve, MD  [facility] [phone] (Monday-Friday 8 am - 5 pm) [phone] (afterhours)

## 2014-03-12 ENCOUNTER — Other Ambulatory Visit: Payer: BC Managed Care – PPO

## 2014-03-12 DIAGNOSIS — E0821 Diabetes mellitus due to underlying condition with diabetic nephropathy: Secondary | ICD-10-CM

## 2014-03-12 DIAGNOSIS — Z1211 Encounter for screening for malignant neoplasm of colon: Secondary | ICD-10-CM

## 2014-03-13 LAB — MICROALBUMIN / CREATININE URINE RATIO
CREATININE UR: 52.6 mg/dL (ref 15.0–278.0)
MICROALB/CREAT RATIO: <5.7 mg/g creat (ref 0.0–30.0)

## 2014-03-14 LAB — FECAL OCCULT BLOOD, IMMUNOCHEMICAL: Fecal Occult Bld: NEGATIVE

## 2014-03-20 ENCOUNTER — Other Ambulatory Visit: Payer: Self-pay | Admitting: Nurse Practitioner

## 2014-05-08 ENCOUNTER — Ambulatory Visit (INDEPENDENT_AMBULATORY_CARE_PROVIDER_SITE_OTHER): Payer: BC Managed Care – PPO | Admitting: Internal Medicine

## 2014-05-08 ENCOUNTER — Encounter: Payer: Self-pay | Admitting: Internal Medicine

## 2014-05-08 VITALS — BP 146/70 | HR 83 | Temp 98.0°F | Resp 18 | Ht 66.5 in | Wt 197.0 lb

## 2014-05-08 DIAGNOSIS — E119 Type 2 diabetes mellitus without complications: Secondary | ICD-10-CM

## 2014-05-08 DIAGNOSIS — L853 Xerosis cutis: Secondary | ICD-10-CM

## 2014-05-08 DIAGNOSIS — I10 Essential (primary) hypertension: Secondary | ICD-10-CM

## 2014-05-08 DIAGNOSIS — L851 Acquired keratosis [keratoderma] palmaris et plantaris: Secondary | ICD-10-CM

## 2014-05-08 MED ORDER — AMLODIPINE BESYLATE 10 MG PO TABS: ORAL_TABLET | ORAL | Status: DC

## 2014-05-08 MED ORDER — ATORVASTATIN CALCIUM 40 MG PO TABS: ORAL_TABLET | ORAL | Status: DC

## 2014-05-08 MED ORDER — VITAMIN B-12 1000 MCG SL SUBL: 1000.0000 ug | SUBLINGUAL_TABLET | Freq: Every day | SUBLINGUAL | Status: DC

## 2014-05-08 MED ORDER — CARVEDILOL 25 MG PO TABS: 25.0000 mg | ORAL_TABLET | Freq: Two times a day (BID) | ORAL | Status: DC

## 2014-05-08 MED ORDER — TRAMADOL HCL 50 MG PO TABS: ORAL_TABLET | ORAL | Status: DC

## 2014-05-08 MED ORDER — ROPINIROLE HCL 0.25 MG PO TABS: ORAL_TABLET | ORAL | Status: DC

## 2014-05-08 MED ORDER — METFORMIN HCL 1000 MG PO TABS: 1000.0000 mg | ORAL_TABLET | Freq: Two times a day (BID) | ORAL | Status: DC

## 2014-05-08 MED ORDER — CLONIDINE HCL 0.1 MG PO TABS: 0.1000 mg | ORAL_TABLET | Freq: Every day | ORAL | Status: DC | PRN

## 2014-05-08 MED ORDER — LOSARTAN POTASSIUM 50 MG PO TABS: 50.0000 mg | ORAL_TABLET | Freq: Every day | ORAL | Status: DC

## 2014-05-08 NOTE — Patient Instructions (Signed)
Stop hydrochlorthiazide  Start taking amlodipine 10 mg daily

## 2014-05-08 NOTE — Progress Notes (Signed)
Patient ID: [name], female   DOB: 01-28-1951, 63 y.o.   MRN: [id number]    Chief Complaint  Patient presents with  . Medical Management of Chronic Issues    f/u on BP   HPI:   63 y/o female patient is here for follow up on her bp. Her bp has been stable overall with few readings above 140/90. She is talking half a tablet of amlodipine and continues to take half a tab of her hctz (has not stopped it). Denies any headache or chest pain. No dyspnea. Has leg swelling that increases by end of the day and comes down with rest.  She has noticed a dry spot on her nose fold that itches and has noticed a small bump in the area  Review of Systems:  Constitutional: Negative for fever, chills, weight loss, malaise/fatigue and diaphoresis.  HENT: Negative for congestion, hearing loss and sore throat.   Eyes: Negative for eye pain, blurred vision, double vision and discharge. diabetic eye exam 12/09/13 Respiratory: Negative for cough, sputum production, shortness of breath and wheezing.   Cardiovascular: Negative for chest pain, palpitations, orthopnea. Continues to have leg swelling towards end of the day but denies claudication  Psychiatric/Behavioral: Negative for depression and memory loss.   Past Medical History  Diagnosis Date  . Hypertension   . Arthritis     osteoarthritis  . Diabetes mellitus     TYPE 2  . Breast cancer 2008    right, ER/PR -  . Hx of radiation therapy 04/04/07 to 05/20/07    right breast/6260 cGy  . Hypercholesterolemia   . CHF (congestive heart failure)   . Gastroparesis   . Nonspecific elevation of levels of transaminase or lactic acid dehydrogenase (LDH)   . Regional enteritis of unspecified site   . Restless legs syndrome (RLS)   . Vitamin deficiency   . Depression   . Anxiety   . Malignant neoplasm of breast (female), unspecified site   . Uterine prolapse without mention of vaginal wall prolapse   . Osteoarthrosis, unspecified whether generalized or  localized, unspecified site   . Lumbago   . Urinary frequency    Current Outpatient Prescriptions on File Prior to Visit  Medication Sig Dispense Refill  . Blood Glucose Monitoring Suppl (ONE TOUCH TEST STRIP HOLDER) MISC as directed.        [name] Kitchen ELIQUIS 5 MG TABS tablet TAKE 1 TABLET BY MOUTH TWICE A DAY  60 tablet  5  . EPINEPHrine (EPIPEN) 0.3 mg/0.3 mL DEVI Inject 0.3 mLs (0.3 mg total) into the muscle once.  1 Device  0  . hydrochlorothiazide (HYDRODIURIL) 25 MG tablet TAKE half a TABLET BY MOUTH DAILY FOR BLOOD PRESSURE  90 tablet  3  . multivitamin (THERAGRAN) per tablet Take 1 tablet by mouth daily.  30 tablet  11  . glucose blood (ONE TOUCH TEST STRIPS) test strip Use as instructed- supply enough for twice daily testing.  100 each  12   No current facility-administered medications on file prior to visit.   Physical exam BP 146/70  Pulse 83  Temp(Src) 98 F (36.7 C) (Oral)  Resp 18  Ht 5' 6.5" (1.689 m)  Wt 197 lb (89.359 kg)  BMI 31.32 kg/m2  SpO2 98%  Constitutional: She is oriented to person, place, and time. She appears well-developed and well-nourished. No distress.   Neck: Normal range of motion. Neck supple. No JVD present.   Cardiovascular: Normal rate, regular rhythm and normal heart  sounds.    No murmur heard. Pulmonary/Chest: Effort normal and breath sounds normal. No respiratory distress. She has no wheezes. She exhibits no tenderness.   Abdominal: Soft. Bowel sounds are normal. She exhibits no distension and no mass. There is no tenderness.  Musculoskeletal: Normal range of motion. She exhibits trace edema Neurological: She is alert and oriented to person, place, and time. No cranial nerve deficit.   Skin: Skin is warm and dry. Has a dry skin area on both nasolabial fold and a small raised surface on right , no pus noted, no drainage   Lab Results  Component Value Date   HGBA1C 6.4* 03/05/2014   Lab Results  Component Value Date   CREATININE 1.14* 03/05/2014    Assessment/plan  1. DIABETES MELLITUS II, UNCOMPLICATED - metFORMIN (GLUCOPHAGE) 1000 MG tablet; Take 1 tablet (1,000 mg total) by mouth 2 (two) times daily with a meal.  Dispense: 180 tablet; Refill: 3 - Hemoglobin A1c; Future  2. HYPERTENSION, BENIGN SYSTEMIC Will increase her amlodipine to 10 mg daily and d/c hctz completely for now. Continue coreg and losartan current dose and prn clonidine - carvedilol (COREG) 25 MG tablet; Take 1 tablet (25 mg total) by mouth 2 (two) times daily with a meal.  Dispense: 180 tablet; Refill: 3 - amLODipine (NORVASC) 10 MG tablet; Take one tablet by mouth once daily  Dispense: 90 tablet; Refill: 3 - Comprehensive metabolic panel; Future - TSH; Future  3. Dry skin Advised to use moisturizer and to apply hydrocortisone cream 0.01% on the nasolabial fold at night for a week. Reassess if no improvement

## 2014-06-07 ENCOUNTER — Other Ambulatory Visit: Payer: Self-pay | Admitting: *Deleted

## 2014-06-07 MED ORDER — TRAMADOL HCL 50 MG PO TABS: ORAL_TABLET | ORAL | Status: DC

## 2014-06-07 NOTE — Telephone Encounter (Signed)
[facility] 

## 2014-06-15 ENCOUNTER — Other Ambulatory Visit: Payer: Self-pay | Admitting: *Deleted

## 2014-06-15 MED ORDER — TRAMADOL HCL 50 MG PO TABS: ORAL_TABLET | ORAL | Status: DC

## 2014-06-15 NOTE — Telephone Encounter (Signed)
[facility] 

## 2014-07-14 ENCOUNTER — Other Ambulatory Visit: Payer: Self-pay | Admitting: Internal Medicine

## 2014-08-09 ENCOUNTER — Other Ambulatory Visit: Payer: Self-pay | Admitting: Internal Medicine

## 2014-08-10 ENCOUNTER — Other Ambulatory Visit: Payer: BC Managed Care – PPO

## 2014-08-15 ENCOUNTER — Ambulatory Visit: Payer: BC Managed Care – PPO | Admitting: Internal Medicine

## 2014-08-21 ENCOUNTER — Other Ambulatory Visit: Payer: Self-pay | Admitting: *Deleted

## 2014-08-21 MED ORDER — TRAMADOL HCL 50 MG PO TABS: ORAL_TABLET | ORAL | Status: DC

## 2014-08-21 NOTE — Telephone Encounter (Signed)
[facility] [address] 

## 2014-09-06 ENCOUNTER — Other Ambulatory Visit: Payer: BC Managed Care – PPO

## 2014-09-06 DIAGNOSIS — I10 Essential (primary) hypertension: Secondary | ICD-10-CM

## 2014-09-06 DIAGNOSIS — E119 Type 2 diabetes mellitus without complications: Secondary | ICD-10-CM

## 2014-09-07 LAB — COMPREHENSIVE METABOLIC PANEL
A/G RATIO: 2 (ref 1.1–2.5)
ALBUMIN: 4.4 g/dL (ref 3.6–4.8)
ALT: 14 IU/L (ref 0–32)
AST: 20 IU/L (ref 0–40)
Alkaline Phosphatase: 97 IU/L (ref 39–117)
BUN/Creatinine Ratio: 24 (ref 11–26)
BUN: 25 mg/dL (ref 8–27)
CO2: 21 mmol/L (ref 18–29)
Calcium: 9.1 mg/dL (ref 8.7–10.3)
Chloride: 103 mmol/L (ref 97–108)
Creatinine, Ser: 1.04 mg/dL — ABNORMAL HIGH (ref 0.57–1.00)
GFR calc Af Amer: 65 mL/min/{1.73_m2} (ref >59)
GFR, EST NON AFRICAN AMERICAN: 57 mL/min/{1.73_m2} — AB (ref >59)
GLUCOSE: 73 mg/dL (ref 65–99)
Globulin, Total: 2.2 g/dL (ref 1.5–4.5)
Potassium: 3.8 mmol/L (ref 3.5–5.2)
Sodium: 140 mmol/L (ref 134–144)
TOTAL PROTEIN: 6.6 g/dL (ref 6.0–8.5)
Total Bilirubin: 0.4 mg/dL (ref 0.0–1.2)

## 2014-09-07 LAB — TSH: TSH: 0.86 u[IU]/mL (ref 0.450–4.500)

## 2014-09-07 LAB — HEMOGLOBIN A1C
Est. average glucose Bld gHb Est-mCnc: 154 mg/dL
Hgb A1c MFr Bld: 7 % — ABNORMAL HIGH (ref 4.8–5.6)

## 2014-09-11 ENCOUNTER — Ambulatory Visit (INDEPENDENT_AMBULATORY_CARE_PROVIDER_SITE_OTHER): Payer: BLUE CROSS/BLUE SHIELD | Admitting: Internal Medicine

## 2014-09-11 ENCOUNTER — Encounter: Payer: Self-pay | Admitting: Internal Medicine

## 2014-09-11 VITALS — BP 120/70 | HR 83 | Temp 98.4°F | Resp 18 | Ht 66.5 in | Wt 183.8 lb

## 2014-09-11 DIAGNOSIS — I10 Essential (primary) hypertension: Secondary | ICD-10-CM

## 2014-09-11 DIAGNOSIS — E7800 Pure hypercholesterolemia, unspecified: Secondary | ICD-10-CM

## 2014-09-11 DIAGNOSIS — R600 Localized edema: Secondary | ICD-10-CM | POA: Insufficient documentation

## 2014-09-11 DIAGNOSIS — E0821 Diabetes mellitus due to underlying condition with diabetic nephropathy: Secondary | ICD-10-CM

## 2014-09-11 DIAGNOSIS — E1122 Type 2 diabetes mellitus with diabetic chronic kidney disease: Secondary | ICD-10-CM

## 2014-09-11 DIAGNOSIS — N183 Chronic kidney disease, stage 3 (moderate): Secondary | ICD-10-CM

## 2014-09-11 DIAGNOSIS — M1990 Unspecified osteoarthritis, unspecified site: Secondary | ICD-10-CM

## 2014-09-11 MED ORDER — AMBULATORY NON FORMULARY MEDICATION: Status: DC

## 2014-09-11 MED ORDER — TRAMADOL HCL 50 MG PO TABS: ORAL_TABLET | ORAL | Status: DC

## 2014-09-11 NOTE — Progress Notes (Signed)
Patient ID: [name], female   DOB: [birth year], 64 y.o.   MRN: [id number]    Chief Complaint  Patient presents with  . Medical Management of Chronic Issues   Allergies  Allergen Reactions  . Hydrocodone Nausea And Vomiting  . Lisinopril    HPI 64 y/o pt is here for routine visit. She lost her mother on new year's eve and is tearful this visit. She has been having increased edema when on her feet and this subsides with rest. Complaint with her medication, bp readings have been good at home. Reviewed her labs, a1c suggestive of worsening sugar control. Denies any concerns this visit. cbg < 200 at home She has history of hypertension, gastroparesis, atrial flutter, DM, OA, hyperlipidemia and CKD  ROS Constitutional: Negative for fever, chills, weight loss, malaise/fatigue and diaphoresis.  HENT: Negative for congestion, hearing loss and sore throat.   Eyes: Negative for eye pain, blurred vision, double vision and discharge. diabetic eye exam 12/09/13 Respiratory: Negative for cough, sputum production, shortness of breath and wheezing.   Cardiovascular: Negative for chest pain, palpitations, orthopnea. Continues to have leg swelling towards end of the day but denies claudication  Gastrointestinal: Negative for heartburn, nausea, vomiting, abdominal pain, diarrhea and constipation.  Genitourinary: Negative for dysuria, urgency, frequency, hematuria and flank pain.  Musculoskeletal: Negative for back pain, falls, joint pain and myalgias.  Skin: Negative for itching and rash.  Neurological: Negative for weakness,dizziness, tingling, focal weakness and headaches.  Psychiatric/Behavioral: Negative for depression and memory loss. The patient is not nervous/anxious. Unable to sleep for more than 3-4 hours at a stretch   Past Medical History  Diagnosis Date  . Hypertension   . Arthritis     osteoarthritis  . Diabetes mellitus     TYPE 2  . Breast cancer 2008    right, ER/PR -  . Hx of  radiation therapy 04/04/07 to 05/20/07    right breast/6260 cGy  . Hypercholesterolemia   . CHF (congestive heart failure)   . Gastroparesis   . Nonspecific elevation of levels of transaminase or lactic acid dehydrogenase (LDH)   . Regional enteritis of unspecified site   . Restless legs syndrome (RLS)   . Vitamin deficiency   . Depression   . Anxiety   . Malignant neoplasm of breast (female), unspecified site   . Uterine prolapse without mention of vaginal wall prolapse   . Osteoarthrosis, unspecified whether generalized or localized, unspecified site   . Lumbago   . Urinary frequency    Current Outpatient Prescriptions on File Prior to Visit  Medication Sig Dispense Refill  . amLODipine (NORVASC) 10 MG tablet Take one tablet by mouth once daily 90 tablet 3  . atorvastatin (LIPITOR) 40 MG tablet TAKE 1 TABLET DAILY FOR CHOLESTEROL 90 tablet 3  . Blood Glucose Monitoring Suppl (ONE TOUCH TEST STRIP HOLDER) MISC as directed.      . carvedilol (COREG) 25 MG tablet Take 1 tablet (25 mg total) by mouth 2 (two) times daily with a meal. 180 tablet 3  . cloNIDine (CATAPRES) 0.1 MG tablet Take 1 tablet (0.1 mg total) by mouth daily as needed. Only if blood pressure is greater than 160/100 30 tablet 1  . Cyanocobalamin (VITAMIN B-12) 1000 MCG SUBL Place 1 tablet (1,000 mcg total) under the tongue daily. 30 each 11  . ELIQUIS 5 MG TABS tablet TAKE 1 TABLET BY MOUTH TWICE A DAY 60 tablet 1  . EPINEPHrine (EPIPEN) 0.3 mg/0.3 mL DEVI Inject  0.3 mLs (0.3 mg total) into the muscle once. 1 Device 0  . hydrochlorothiazide (HYDRODIURIL) 25 MG tablet TAKE half a TABLET BY MOUTH DAILY FOR BLOOD PRESSURE 90 tablet 3  . losartan (COZAAR) 50 MG tablet Take 1 tablet (50 mg total) by mouth daily. 90 tablet 3  . metFORMIN (GLUCOPHAGE) 1000 MG tablet Take 1 tablet (1,000 mg total) by mouth 2 (two) times daily with a meal. 180 tablet 3  . multivitamin (THERAGRAN) per tablet Take 1 tablet by mouth daily. 30 tablet 11   . rOPINIRole (REQUIP) 0.25 MG tablet TAKE 1 TABLET BY MOUTH 2 TIMES DAILY FOR RESTLESS LEGS SYNDROME 180 tablet 3  . glucose blood (ONE TOUCH TEST STRIPS) test strip Use as instructed- supply enough for twice daily testing. 100 each 12   No current facility-administered medications on file prior to visit.    Physical exam BP 120/70 mmHg  Pulse 83  Temp(Src) 98.4 F (36.9 C) (Oral)  Resp 18  Ht 5' 6.5" (1.689 m)  Wt 183 lb 12.8 oz (83.371 kg)  BMI 29.23 kg/m2  SpO2 98%   General- elderly female in no acute distress Head- atraumatic, normocephalic Eyes- PERRLA, EOMI, no pallor, no icterus, no discharge Neck- no lymphadenopathy, no thyromegaly, no jugular vein distension, no carotid bruit Throat- moist mucus membrane, normal oropharynx Cardiovascular- normal s1,s2, no murmurs/ rubs/ gallops, intact distal pulses Respiratory- bilateral clear to auscultation, no wheeze, no rhonchi, no crackles, no use of accessory muscles Abdomen- bowel sounds present, soft, non tender Musculoskeletal- able to move all 4 extremities, no spinal and paraspinal tenderness, steady gait, no use of assistive device, normal range of motion, 1+ leg edema Neurological- no focal deficit, normal reflexes, normal muscle strength, normal sensation to fine touch and vibration Skin- warm and dry, varicose veins Psychiatry- alert and oriented to person, place and time, normal mood and affect    Labs  Lipid Panel     Component Value Date/Time   CHOL 226* 05/20/2011 0947   TRIG 58 03/05/2014 0809   HDL 96 03/05/2014 0809   HDL 82 05/20/2011 0947   CHOLHDL 1.9 03/05/2014 0809   CHOLHDL 2.8 05/20/2011 0947   VLDL 15 05/20/2011 0947   LDLCALC 70 03/05/2014 0809   LDLCALC 129* 05/20/2011 0947   LDLDIRECT 75 01/26/2011 0932   CMP Latest Ref Rng 09/06/2014 03/05/2014 11/27/2013  Glucose 65 - 99 mg/dL 73 53(L) 61(L)  BUN 8 - 27 mg/dL 25 22 26   Creatinine 0.57 - 1.00 mg/dL 1.04(H) 1.14(H) 1.29(H)  Sodium 134 -  144 mmol/L 140 142 140  Potassium 3.5 - 5.2 mmol/L 3.8 3.8 3.6  Chloride 97 - 108 mmol/L 103 104 99  CO2 18 - 29 mmol/L 21 22 23   Calcium 8.7 - 10.3 mg/dL 9.1 9.5 9.3  Total Protein 6.0 - 8.5 g/dL 6.6 6.3 -  Albumin 3.6 - 4.8 g/dL 4.4 3.9 -  Total Bilirubin 0.0 - 1.2 mg/dL 0.4 0.3 -  Alkaline Phos 39 - 117 IU/L 97 88 -  AST 0 - 40 IU/L 20 39 -  ALT 0 - 32 IU/L 14 31 -   CBC Latest Ref Rng 03/05/2014 12/12/2012 06/13/2011  WBC 3.4 - 10.8 x10E3/uL 5.9 7.2 9.1  Hemoglobin 11.1 - 15.9 g/dL 10.5(L) 11.5 8.7(L)  Hematocrit 34.0 - 46.6 % 31.3(L) 34.6 25.8(L)  Platelets 150 - 400 K/uL - - 162   Lab Results  Component Value Date   HGBA1C 7.0* 09/06/2014    Assessment/plan  1. HYPERTENSION,  BENIGN SYSTEMIC Stable, continue norvasc 10 mg daily, coreg 25 mg bid, hctz 25 mg daily and clonidine 0.1 mg prn for > 160/90 and losartan 50 mg daily - CMP; Future - CBC with Differential; Future - Lipid Panel; Future - Hemoglobin A1c; Future  2. Diabetes mellitus due to underlying condition with diabetic nephropathy Reviewed a1c, continue metformin 1000 mg bid, monitor cbg, if a1c > 7.5, consider adding second agent. Continue lipitor, losartan  - Lipid Panel; Future - Hemoglobin A1c; Future  3. Osteoarthritis, unspecified osteoarthritis type, unspecified site Continue prn tramadol for now  4. HYPERCHOLESTEROLEMIA Continue lipitor 40 mg daily, monitor clinically - Lipid Panel; Future  5. CKD stage 3 due to type 2 diabetes mellitus Monitor renal function, off NSAIDs, encouraged hydration  6. Bilateral edema of lower extremity With varicose veins, advised to wear ted hose when out of bed, script provided

## 2014-09-12 LAB — LIPID PANEL W/O CHOL/HDL RATIO
CHOLESTEROL TOTAL: 206 mg/dL — AB (ref 100–199)
HDL: 106 mg/dL (ref >39)
LDL Calculated: 88 mg/dL (ref 0–99)
Triglycerides: 59 mg/dL (ref 0–149)
VLDL Cholesterol Cal: 12 mg/dL (ref 5–40)

## 2014-09-12 LAB — SPECIMEN STATUS REPORT

## 2014-09-13 ENCOUNTER — Ambulatory Visit (INDEPENDENT_AMBULATORY_CARE_PROVIDER_SITE_OTHER): Payer: BLUE CROSS/BLUE SHIELD | Admitting: Internal Medicine

## 2014-09-13 ENCOUNTER — Encounter: Payer: Self-pay | Admitting: Internal Medicine

## 2014-09-13 VITALS — BP 160/84 | HR 69 | Ht 66.0 in | Wt 180.4 lb

## 2014-09-13 DIAGNOSIS — I429 Cardiomyopathy, unspecified: Secondary | ICD-10-CM

## 2014-09-13 DIAGNOSIS — I483 Typical atrial flutter: Secondary | ICD-10-CM

## 2014-09-13 NOTE — Patient Instructions (Signed)
Your physician recommends that you continue on your current medications as directed. Please refer to the Current Medication list given to you today.  Your physician wants you to follow-up in: 1 year with Dr. Klein.  You will receive a reminder letter in the mail two months in advance. If you don't receive a letter, please call our office to schedule the follow-up appointment.  

## 2014-09-13 NOTE — Progress Notes (Signed)
Patient Care Team: Blanchie Serve, MD as PCP - General (Internal Medicine)   HPI  [name] is a 64 y.o. female Seen in followup for a diagnosis of atrial flutter/fibrillation in the context of a cardiomyopathy and a prior TIA.  She also has hypertension  She was started on apixaban. Echo 6/14   Echo >> normal EF   We reviewed the electrocardiogram that was diagnosed his atrial flutter. It shows sinus rhythm. I have reviewed all ECGs in epic. There is no evidence of atrial arrhythmia.   The patient denies chest pain, shortness of breath, nocturnal dyspnea, orthopnea or peripheral edema.  There have been no palpitations, lightheadedness or syncope.     Catheterization 2001 demonstrated ejection fraction of 40%1; repeat assessment in 2006 by nuclear study had an ejection fraction of 48% By 2009 ejection fraction had normalized by echocardiogram.    She works at night  And loves to crochet  And knit   Her mom died after 37 yrs of Altzheimers  She is to be buried on Saturday  Past Medical History  Diagnosis Date  . Hypertension   . Arthritis     osteoarthritis  . Diabetes mellitus     TYPE 2  . Breast cancer 2008    right, ER/PR -  . Hx of radiation therapy 04/04/07 to 05/20/07    right breast/6260 cGy  . Hypercholesterolemia   . CHF (congestive heart failure)   . Gastroparesis   . Nonspecific elevation of levels of transaminase or lactic acid dehydrogenase (LDH)   . Regional enteritis of unspecified site   . Restless legs syndrome (RLS)   . Vitamin deficiency   . Depression   . Anxiety   . Malignant neoplasm of breast (female), unspecified site   . Uterine prolapse without mention of vaginal wall prolapse   . Osteoarthrosis, unspecified whether generalized or localized, unspecified site   . Lumbago   . Urinary frequency     Past Surgical History  Procedure Laterality Date  . Breast lumpectomy Right 11/17/06    re-excision 01/13/07  . Arthroplasty     left knee  . Wrist surgery      carpel tunnel  . Total knee arthroplasty Left 06/10/2011       . Abdominal hysterectomy  1997    TAH/BSO, ENDOMETRIAL SARCOMA  . Total knee arthroplasty Right 2006, 2009    Dr Para March  . Total knee arthroplasty Left 2008    Dr Para March  . Radical hysterectomy  1997    Current Outpatient Prescriptions  Medication Sig Dispense Refill  . AMBULATORY NON FORMULARY MEDICATION Medication Name: ted stocking- thigh high- wear it when out of bed everyday, remove it before going to bed 2 each 1  . amLODipine (NORVASC) 10 MG tablet Take one tablet by mouth once daily 90 tablet 3  . atorvastatin (LIPITOR) 40 MG tablet TAKE 1 TABLET DAILY FOR CHOLESTEROL 90 tablet 3  . Blood Glucose Monitoring Suppl (ONE TOUCH TEST STRIP HOLDER) MISC as directed.      . carvedilol (COREG) 25 MG tablet Take 1 tablet (25 mg total) by mouth 2 (two) times daily with a meal. 180 tablet 3  . cloNIDine (CATAPRES) 0.1 MG tablet Take 1 tablet (0.1 mg total) by mouth daily as needed. Only if blood pressure is greater than 160/100 30 tablet 1  . Elastic Bandages & Supports (T.E.D. KNEE LENGTH/S-REGULAR) MISC by Does not apply route. Apply it when out of bed,  remove before going to bed    . ELIQUIS 5 MG TABS tablet TAKE 1 TABLET BY MOUTH TWICE A DAY 60 tablet 1  . EPINEPHrine (EPIPEN) 0.3 mg/0.3 mL DEVI Inject 0.3 mLs (0.3 mg total) into the muscle once. 1 Device 0  . hydrochlorothiazide (HYDRODIURIL) 25 MG tablet TAKE half a TABLET BY MOUTH DAILY FOR BLOOD PRESSURE 90 tablet 3  . losartan (COZAAR) 50 MG tablet Take 1 tablet (50 mg total) by mouth daily. 90 tablet 3  . metFORMIN (GLUCOPHAGE) 1000 MG tablet Take 1 tablet (1,000 mg total) by mouth 2 (two) times daily with a meal. 180 tablet 3  . multivitamin (THERAGRAN) per tablet Take 1 tablet by mouth daily. 30 tablet 11  . rOPINIRole (REQUIP) 0.25 MG tablet TAKE 1 TABLET BY MOUTH 2 TIMES DAILY FOR RESTLESS LEGS SYNDROME 180 tablet 3  . traMADol  (ULTRAM) 50 MG tablet Take one tablet by mouth twice daily as needed for pain 180 tablet 0  . glucose blood (ONE TOUCH TEST STRIPS) test strip Use as instructed- supply enough for twice daily testing. 100 each 12   No current facility-administered medications for this visit.    Allergies  Allergen Reactions  . Hydrocodone Nausea And Vomiting  . Lisinopril     Review of Systems negative except from HPI and PMH  Physical Exam BP 160/84 mmHg  Pulse 69  Ht 5\' 6"  (1.676 m)  Wt 180 lb 6.4 oz (81.829 kg)  BMI 29.13 kg/m2 Well developed and well nourished in no acute distress HENT normal E scleral and icterus clear Neck Supple JVP flat; carotids brisk and full Clear to ausculation Regular rate and rhythm, no murmurs gallops or rub Soft with active bowel sounds No clubbing cyanosis none Edema Alert and oriented, grossly normal motor and sensory function Skin Warm and Dry  NSR  septal mi   Assessment and  Plan  Hypertension  Afib  TIA  Her blood pressure is elevated. She says however, at home it is in the 120 range. She explains it today as she has been coming [place] the [facility] home.    She's had no symptomatically atrial fibrillation. She continues to take apixoban . She was called from the insurance company last night and told her it would be 90 valve incentive $10 a month. I told her we would consider changing her NOAC.

## 2014-09-14 ENCOUNTER — Other Ambulatory Visit: Payer: Self-pay | Admitting: *Deleted

## 2014-09-14 DIAGNOSIS — E119 Type 2 diabetes mellitus without complications: Secondary | ICD-10-CM

## 2014-09-14 DIAGNOSIS — I10 Essential (primary) hypertension: Secondary | ICD-10-CM

## 2014-09-14 MED ORDER — ATORVASTATIN CALCIUM 40 MG PO TABS: ORAL_TABLET | ORAL | Status: DC

## 2014-09-14 MED ORDER — CARVEDILOL 25 MG PO TABS: 25.0000 mg | ORAL_TABLET | Freq: Two times a day (BID) | ORAL | Status: DC

## 2014-09-14 MED ORDER — TRAMADOL HCL 50 MG PO TABS: ORAL_TABLET | ORAL | Status: DC

## 2014-09-14 MED ORDER — ROPINIROLE HCL 0.25 MG PO TABS: ORAL_TABLET | ORAL | Status: DC

## 2014-09-14 MED ORDER — METFORMIN HCL 1000 MG PO TABS: 1000.0000 mg | ORAL_TABLET | Freq: Two times a day (BID) | ORAL | Status: DC

## 2014-09-14 NOTE — Telephone Encounter (Signed)
[facility]

## 2014-09-17 ENCOUNTER — Other Ambulatory Visit: Payer: Self-pay | Admitting: Internal Medicine

## 2014-10-05 ENCOUNTER — Other Ambulatory Visit: Payer: Self-pay | Admitting: Internal Medicine

## 2014-10-30 ENCOUNTER — Encounter: Payer: Self-pay | Admitting: Internal Medicine

## 2014-11-06 ENCOUNTER — Other Ambulatory Visit: Payer: Self-pay

## 2014-11-06 DIAGNOSIS — Z1231 Encounter for screening mammogram for malignant neoplasm of breast: Secondary | ICD-10-CM

## 2014-11-20 ENCOUNTER — Telehealth: Payer: Self-pay | Admitting: Internal Medicine

## 2014-11-20 NOTE — Telephone Encounter (Signed)
Called pt left message, need to know if she had a flu shot for 2015...[facility]

## 2014-11-26 ENCOUNTER — Encounter: Payer: Self-pay | Admitting: Nurse Practitioner

## 2014-11-26 ENCOUNTER — Ambulatory Visit (INDEPENDENT_AMBULATORY_CARE_PROVIDER_SITE_OTHER): Payer: BLUE CROSS/BLUE SHIELD | Admitting: Nurse Practitioner

## 2014-11-26 DIAGNOSIS — R634 Abnormal weight loss: Secondary | ICD-10-CM

## 2014-11-26 DIAGNOSIS — E119 Type 2 diabetes mellitus without complications: Secondary | ICD-10-CM

## 2014-11-26 NOTE — Progress Notes (Signed)
Patient ID: [name], female   DOB: [birth year], 64 y.o.   MRN: [id number]    PCP: Gildardo Cranker, DO  Allergies  Allergen Reactions  . Hydrocodone Nausea And Vomiting  . Lisinopril     Chief Complaint  Patient presents with  . Acute Visit    Patient c/o weakness and fatigue   . FYI    Patient stopped wearing ted hose     HPI: Patient is a 64 y.o. female seen in the office today for feeling bad. Has felt bad for several weeks. Only wants to go to bed and stay there. Decreased appetite. Having diarrhea off and on for a few years. No blood in her stools or dark stools. Early satiety. Having nausea when she tires to eat Reports she had a colonoscopy unsure when but it has been many years.   No pain when urinating.  No abdominal pain. Always cold. Does not sleep well at night- this is not new No wounds or skin issues Starting have a cold, cough with runny nose for 2-3 days.  No new medications Does not smoke cigarettes. Does not drink ETOH No fevers or chills No sick contacts   Review of Systems:  Review of Systems  Constitutional: Positive for fatigue and unexpected weight change. Negative for fever and chills.  HENT: Negative for congestion, dental problem, sinus pressure and sore throat.   Respiratory: Negative for cough and shortness of breath.   Cardiovascular: Negative for chest pain, palpitations and leg swelling.  Gastrointestinal: Positive for nausea. Negative for abdominal pain, diarrhea, constipation and abdominal distention.  Genitourinary: Negative for difficulty urinating.  Skin: Negative for rash and wound.  Neurological: Negative for dizziness, weakness, light-headedness and headaches.    Past Medical History  Diagnosis Date  . Hypertension   . Arthritis     osteoarthritis  . Diabetes mellitus     TYPE 2  . Breast cancer 2008    right, ER/PR -  . Hx of radiation therapy 04/04/07 to 05/20/07    right breast/6260 cGy  . Hypercholesterolemia   .  CHF (congestive heart failure)   . Gastroparesis   . Nonspecific elevation of levels of transaminase or lactic acid dehydrogenase (LDH)   . Regional enteritis of unspecified site   . Restless legs syndrome (RLS)   . Vitamin deficiency   . Depression   . Anxiety   . Malignant neoplasm of breast (female), unspecified site   . Uterine prolapse without mention of vaginal wall prolapse   . Osteoarthrosis, unspecified whether generalized or localized, unspecified site   . Lumbago   . Urinary frequency    Past Surgical History  Procedure Laterality Date  . Breast lumpectomy Right 11/17/06    re-excision 01/13/07  . Arthroplasty      left knee  . Wrist surgery      carpel tunnel  . Total knee arthroplasty Left 06/10/2011       . Abdominal hysterectomy  1997    TAH/BSO, ENDOMETRIAL SARCOMA  . Total knee arthroplasty Right 2006, 2009    Dr Para March  . Total knee arthroplasty Left 2008    Dr Para March  . Radical hysterectomy  1997   Social History:   reports that she quit smoking about 23 years ago. She has never used smokeless tobacco. She reports that she does not drink alcohol or use illicit drugs.  Family History  Problem Relation Age of Onset  . Diabetes Mother   . Diabetes Father   .  Aneurysm Sister   . Arthritis Sister   . Diabetes Brother   . Heart Problems Brother     Medications: Patient's Medications  New Prescriptions   No medications on file  Previous Medications   AMLODIPINE (NORVASC) 10 MG TABLET    Take one tablet by mouth once daily   ATORVASTATIN (LIPITOR) 40 MG TABLET    Take one tablet by mouth once daily for cholesterol   BLOOD GLUCOSE MONITORING SUPPL (ONE TOUCH TEST STRIP HOLDER) MISC    as directed.     CARVEDILOL (COREG) 25 MG TABLET    Take 1 tablet (25 mg total) by mouth 2 (two) times daily with a meal.   CLONIDINE (CATAPRES) 0.1 MG TABLET    Take 1 tablet (0.1 mg total) by mouth daily as needed. Only if blood pressure is greater than 160/100   ELIQUIS 5  MG TABS TABLET    TAKE 1 TABLET BY MOUTH TWICE A DAY   EPINEPHRINE (EPIPEN) 0.3 MG/0.3 ML DEVI    Inject 0.3 mLs (0.3 mg total) into the muscle once.   GLUCOSE BLOOD (ONE TOUCH TEST STRIPS) TEST STRIP    Use as instructed- supply enough for twice daily testing.   HYDROCHLOROTHIAZIDE (HYDRODIURIL) 25 MG TABLET    TAKE half a TABLET BY MOUTH DAILY FOR BLOOD PRESSURE   LOSARTAN (COZAAR) 50 MG TABLET    Take 1 tablet (50 mg total) by mouth daily.   METFORMIN (GLUCOPHAGE) 1000 MG TABLET    Take 1 tablet (1,000 mg total) by mouth 2 (two) times daily with a meal.   MULTIVITAMIN (THERAGRAN) PER TABLET    Take 1 tablet by mouth daily.   ROPINIROLE (REQUIP) 0.25 MG TABLET    Take one tablet by mouth twice daily for restless leg syndrome   TRAMADOL (ULTRAM) 50 MG TABLET    TAKE 1 TABLET BY MOUTH TWICE A DAY AS NEEDED FOR PAIN  Modified Medications   No medications on file  Discontinued Medications   AMBULATORY NON FORMULARY MEDICATION    Medication Name: ted stocking- thigh high- wear it when out of bed everyday, remove it before going to bed   ELASTIC BANDAGES & SUPPORTS (T.E.D. KNEE LENGTH/S-REGULAR) MISC    by Does not apply route. Apply it when out of bed, remove before going to bed     Physical Exam:  Filed Vitals:   11/26/14 1405  BP: 120/70  Pulse: 80  Temp: 97.5 F (36.4 C)  TempSrc: Oral  Resp: 20  Height: 5\' 6"  (1.676 m)  Weight: 169 lb 9.6 oz (76.93 kg)  SpO2: 98%    Physical Exam  Constitutional: She is oriented to person, place, and time. She appears well-developed and well-nourished. No distress.  HENT:  Head: Normocephalic and atraumatic.  Right Ear: External ear normal.  Left Ear: External ear normal.  Mouth/Throat: Oropharynx is clear and moist. No oropharyngeal exudate.  Eyes: Conjunctivae and EOM are normal. Pupils are equal, round, and reactive to light.  Neck: Normal range of motion. Neck supple. No JVD present.  Cardiovascular: Normal rate, regular rhythm and  normal heart sounds.   No murmur heard. Pulmonary/Chest: Effort normal and breath sounds normal. No respiratory distress. She has no wheezes. She exhibits no tenderness.  Abdominal: Soft. Bowel sounds are normal. She exhibits no distension and no mass. There is no tenderness. There is no rebound and no guarding.  Musculoskeletal: Normal range of motion. She exhibits no edema or tenderness.  Lymphadenopathy:  She has no cervical adenopathy.  Neurological: She is alert and oriented to person, place, and time. No cranial nerve deficit.  Skin: Skin is warm and dry. No rash noted. She is not diaphoretic. No erythema.  Psychiatric: She has a normal mood and affect. Her behavior is normal.    Labs reviewed: Basic Metabolic Panel:  Recent Labs  11/27/13 0823 03/05/14 0809 09/06/14 0829  NA 140 142 140  K 3.6 3.8 3.8  CL 99 104 103  CO2 23 22 21   GLUCOSE 61* 53* 73  BUN 26 22 25   CREATININE 1.29* 1.14* 1.04*  CALCIUM 9.3 9.5 9.1  TSH  --   --  0.860   Liver Function Tests:  Recent Labs  03/05/14 0809 09/06/14 0829  AST 39 20  ALT 31 14  ALKPHOS 88 97  BILITOT 0.3 0.4  PROT 6.3 6.6   No results for input(s): LIPASE, AMYLASE in the last 8760 hours. No results for input(s): AMMONIA in the last 8760 hours. CBC:  Recent Labs  03/05/14 0809  WBC 5.9  NEUTROABS 2.6  HGB 10.5*  HCT 31.3*  MCV 92   Lipid Panel:  Recent Labs  03/05/14 0809 09/06/14 0829  CHOL 178 206*  HDL 96 106  LDLCALC 70 88  TRIG 58 59  CHOLHDL 1.9  --    TSH:  Recent Labs  09/06/14 0829  TSH 0.860   A1C: Lab Results  Component Value Date   HGBA1C 7.0* 09/06/2014     Assessment/Plan  1. Loss of weight With decreased appetite and some nausea -pt also reports on and off diarrhea for years -will start will lab work to follow up, pt unsure of last screening colonoscopy but due to other GI symptoms and weight loss will send to GI at this time - CBC with Differential/Platelet -  Ambulatory referral to Gastroenterology - Comprehensive metabolic panel - TSH - Lipase - Amylase  3. Type 2 diabetes mellitus without complication -blood sugars WNR at home, conts on diabetic medications - Hemoglobin A1c

## 2014-11-27 LAB — COMPREHENSIVE METABOLIC PANEL
A/G RATIO: 1.6 (ref 1.1–2.5)
ALBUMIN: 4.4 g/dL (ref 3.6–4.8)
ALK PHOS: 100 IU/L (ref 39–117)
ALT: 22 IU/L (ref 0–32)
AST: 27 IU/L (ref 0–40)
BILIRUBIN TOTAL: 0.3 mg/dL (ref 0.0–1.2)
BUN/Creatinine Ratio: 18 (ref 11–26)
BUN: 23 mg/dL (ref 8–27)
CHLORIDE: 104 mmol/L (ref 97–108)
CO2: 20 mmol/L (ref 18–29)
Calcium: 9.7 mg/dL (ref 8.7–10.3)
Creatinine, Ser: 1.3 mg/dL — ABNORMAL HIGH (ref 0.57–1.00)
GFR calc Af Amer: 50 mL/min/{1.73_m2} — ABNORMAL LOW (ref >59)
GFR calc non Af Amer: 43 mL/min/{1.73_m2} — ABNORMAL LOW (ref >59)
GLOBULIN, TOTAL: 2.8 g/dL (ref 1.5–4.5)
Glucose: 122 mg/dL — ABNORMAL HIGH (ref 65–99)
Potassium: 4.2 mmol/L (ref 3.5–5.2)
SODIUM: 142 mmol/L (ref 134–144)
Total Protein: 7.2 g/dL (ref 6.0–8.5)

## 2014-11-27 LAB — CBC WITH DIFFERENTIAL/PLATELET
Basophils Absolute: 0 10*3/uL (ref 0.0–0.2)
Basos: 0 %
EOS: 3 %
Eosinophils Absolute: 0.1 10*3/uL (ref 0.0–0.4)
HEMATOCRIT: 33.4 % — AB (ref 34.0–46.6)
Hemoglobin: 11.2 g/dL (ref 11.1–15.9)
IMMATURE GRANULOCYTES: 0 %
Immature Grans (Abs): 0 10*3/uL (ref 0.0–0.1)
LYMPHS ABS: 1.9 10*3/uL (ref 0.7–3.1)
Lymphs: 38 %
MCH: 30.8 pg (ref 26.6–33.0)
MCHC: 33.5 g/dL (ref 31.5–35.7)
MCV: 92 fL (ref 79–97)
MONOCYTES: 9 %
Monocytes Absolute: 0.4 10*3/uL (ref 0.1–0.9)
Neutrophils Absolute: 2.6 10*3/uL (ref 1.4–7.0)
Neutrophils Relative %: 50 %
PLATELETS: 294 10*3/uL (ref 150–379)
RBC: 3.64 x10E6/uL — AB (ref 3.77–5.28)
RDW: 15.7 % — ABNORMAL HIGH (ref 12.3–15.4)
WBC: 5 10*3/uL (ref 3.4–10.8)

## 2014-11-27 LAB — AMYLASE: Amylase: 91 U/L (ref 31–124)

## 2014-11-27 LAB — TSH: TSH: 0.597 u[IU]/mL (ref 0.450–4.500)

## 2014-11-27 LAB — LIPASE: Lipase: 26 U/L (ref 0–59)

## 2014-11-27 LAB — HEMOGLOBIN A1C
Est. average glucose Bld gHb Est-mCnc: 143 mg/dL
Hgb A1c MFr Bld: 6.6 % — ABNORMAL HIGH (ref 4.8–5.6)

## 2014-12-07 ENCOUNTER — Ambulatory Visit
Admission: RE | Admit: 2014-12-07 | Discharge: 2014-12-07 | Disposition: A | Discharge status: Home / Self Care | Payer: BLUE CROSS/BLUE SHIELD | Source: Ambulatory Visit

## 2014-12-07 ENCOUNTER — Ambulatory Visit: Payer: BLUE CROSS/BLUE SHIELD | Admitting: Nurse Practitioner

## 2014-12-07 DIAGNOSIS — Z1231 Encounter for screening mammogram for malignant neoplasm of breast: Secondary | ICD-10-CM

## 2014-12-10 ENCOUNTER — Other Ambulatory Visit (INDEPENDENT_AMBULATORY_CARE_PROVIDER_SITE_OTHER): Payer: BLUE CROSS/BLUE SHIELD

## 2014-12-10 ENCOUNTER — Ambulatory Visit (INDEPENDENT_AMBULATORY_CARE_PROVIDER_SITE_OTHER): Payer: BLUE CROSS/BLUE SHIELD | Admitting: Physician Assistant

## 2014-12-10 ENCOUNTER — Encounter: Payer: Self-pay | Admitting: Gastroenterology

## 2014-12-10 ENCOUNTER — Encounter: Payer: Self-pay | Admitting: Physician Assistant

## 2014-12-10 VITALS — BP 110/70 | HR 66 | Ht 66.0 in | Wt 174.2 lb

## 2014-12-10 DIAGNOSIS — R197 Diarrhea, unspecified: Secondary | ICD-10-CM

## 2014-12-10 DIAGNOSIS — R634 Abnormal weight loss: Secondary | ICD-10-CM

## 2014-12-10 DIAGNOSIS — R112 Nausea with vomiting, unspecified: Secondary | ICD-10-CM | POA: Diagnosis not present

## 2014-12-10 DIAGNOSIS — R140 Abdominal distension (gaseous): Secondary | ICD-10-CM | POA: Diagnosis not present

## 2014-12-10 DIAGNOSIS — I4891 Unspecified atrial fibrillation: Secondary | ICD-10-CM

## 2014-12-10 DIAGNOSIS — Z7901 Long term (current) use of anticoagulants: Secondary | ICD-10-CM

## 2014-12-10 LAB — CBC WITH DIFFERENTIAL/PLATELET
BASOS ABS: 0 10*3/uL (ref 0.0–0.1)
Basophils Relative: 0.3 % (ref 0.0–3.0)
EOS ABS: 0.1 10*3/uL (ref 0.0–0.7)
Eosinophils Relative: 2.6 % (ref 0.0–5.0)
HCT: 32.4 % — ABNORMAL LOW (ref 36.0–46.0)
Hemoglobin: 10.9 g/dL — ABNORMAL LOW (ref 12.0–15.0)
Lymphocytes Relative: 41.1 % (ref 12.0–46.0)
Lymphs Abs: 2.1 10*3/uL (ref 0.7–4.0)
MCHC: 33.8 g/dL (ref 30.0–36.0)
MCV: 93.1 fl (ref 78.0–100.0)
Monocytes Absolute: 0.5 10*3/uL (ref 0.1–1.0)
Monocytes Relative: 10 % (ref 3.0–12.0)
NEUTROS ABS: 2.3 10*3/uL (ref 1.4–7.7)
NEUTROS PCT: 46 % (ref 43.0–77.0)
Platelets: 192 10*3/uL (ref 150.0–400.0)
RBC: 3.48 Mil/uL — ABNORMAL LOW (ref 3.87–5.11)
RDW: 15.4 % (ref 11.5–15.5)
WBC: 5 10*3/uL (ref 4.0–10.5)

## 2014-12-10 LAB — BASIC METABOLIC PANEL
BUN: 34 mg/dL — ABNORMAL HIGH (ref 6–23)
CHLORIDE: 106 meq/L (ref 96–112)
CO2: 24 meq/L (ref 19–32)
CREATININE: 1.42 mg/dL — AB (ref 0.40–1.20)
Calcium: 9.2 mg/dL (ref 8.4–10.5)
GFR: 47.62 mL/min — ABNORMAL LOW (ref >60.00)
GLUCOSE: 143 mg/dL — AB (ref 70–99)
Potassium: 3.3 mEq/L — ABNORMAL LOW (ref 3.5–5.1)
Sodium: 140 mEq/L (ref 135–145)

## 2014-12-10 LAB — IGA: IgA: 297 mg/dL (ref 68–378)

## 2014-12-10 MED ORDER — MOVIPREP 100 G PO SOLR: 1.0000 | Freq: Once | ORAL | Status: DC

## 2014-12-10 MED ORDER — OMEPRAZOLE 20 MG PO CPDR: 20.0000 mg | DELAYED_RELEASE_CAPSULE | Freq: Every day | ORAL | Status: DC

## 2014-12-10 NOTE — Progress Notes (Signed)
Patient ID: [name], female   DOB: June 16, 1951, 64 y.o.   MRN: [id number]     History of Present Illness: [name] who was last seen here in June 2014. She reports that she had a colonoscopy 6 or so years prior to that visit. Unfortunately she does not remember who did it or where she had it done and she does not recall the findings. When she was seen in 12/05/12 she was complaining of nausea, vomiting, belching, and loss of appetite. She has lost 30 pounds at that time. She was scheduled for a colonoscopy and endoscopy but her cases were canceled when she needed to see cardiology and she never rescheduled them. She recently was seen by her PCP and found to have continued weight loss and was referred for further evaluation.  [name] states her symptoms all started in 12/05/2012 when her husband passed away. She states she has never recovered from his loss. She has felt "sick to my stomach" since then. She vomited several times per week. She is nauseous every day she states the nausea starts whenever she eats and after several bites she has to stop eating. She uses an over-the-counter medication for nausea with varying degrees of relief. She states if she tries to eat a normal sized meal she will immediately vomit it within 10 minutes or several hours later have explosive diarrhea. She denies dysphagia. She states that her stools have been alternating between days of formed stools and days of explosive diarrhea she states this started when her husband died as well. She has no bright red blood per rectum or melena and has not noticed any oily stools she feels very bloated and distended. She had a partial hysterectomy years ago but states she still has one ovary. She does see her gynecologist every September. She has a history of breast cancer diagnosed 7 years ago treated with a right lumpectomy and followed by radiation. She states her diabetes has been under fair control and says her recent A1c was 6.6.  Her symptoms of nausea and diarrhea seem to be exacerbated after the death of her mother in Oct 07, 2022 of this year. Unfortunately her brother passed away 3 days ago and she is quite distraught over this.  She has a history of atrial flutter/fibrillation in the context of cardiomyopathy and prior TIA she also has a history of hypertension. She was started on apex of the. Echocardiogram in June 2014 had a normal ejection fraction. Catheterization in 12-06-99 demonstrated ejection fraction of 40%. Repeat assessment in 12-05-04 by nuclear study had an ejection fraction of 48%. Bi-2009 ejection fraction had normalized by echocardiogram.   Past Medical History  Diagnosis Date  . Hypertension   . Arthritis     osteoarthritis  . Diabetes mellitus     TYPE 2  . Breast cancer December 06, 2006    right, ER/PR -  . Hx of radiation therapy 04/04/07 to 05/20/07    right breast/6260 cGy  . Hypercholesterolemia   . CHF (congestive heart failure)   . Gastroparesis   . Nonspecific elevation of levels of transaminase or lactic acid dehydrogenase (LDH)   . Regional enteritis of unspecified site   . Restless legs syndrome (RLS)   . Vitamin deficiency   . Depression   . Anxiety   . Malignant neoplasm of breast (female), unspecified site   . Uterine prolapse without mention of vaginal wall prolapse   . Osteoarthrosis, unspecified whether generalized or localized, unspecified site   .  Lumbago   . Urinary frequency     Past Surgical History  Procedure Laterality Date  . Breast lumpectomy Right 11/17/06    re-excision 01/13/07  . Arthroplasty      left knee  . Wrist surgery      carpel tunnel  . Total knee arthroplasty Left 06/10/2011       . Abdominal hysterectomy  1997    TAH/BSO, ENDOMETRIAL SARCOMA  . Total knee arthroplasty Right 2006, 2009    Dr Para March  . Total knee arthroplasty Left 2008    Dr Para March  . Radical hysterectomy  1997   Family History  Problem Relation Age of Onset  . Diabetes Mother   . Diabetes  Father   . Aneurysm Sister   . Arthritis Sister   . Diabetes Brother   . Heart Problems Brother   . Diabetes Brother   . Colon cancer Neg Hx    History  Substance Use Topics  . Smoking status: Former Smoker -- 0.50 packs/day for 20 years    Quit date: 09/08/1991  . Smokeless tobacco: Never Used  . Alcohol Use: No   Current Outpatient Prescriptions  Medication Sig Dispense Refill  . acetaminophen (TYLENOL) 650 MG CR tablet Take 650 mg by mouth every 8 (eight) hours as needed for pain.    [name] Kitchen amLODipine (NORVASC) 10 MG tablet Take one tablet by mouth once daily 90 tablet 3  . atorvastatin (LIPITOR) 40 MG tablet Take one tablet by mouth once daily for cholesterol 90 tablet 3  . Blood Glucose Monitoring Suppl (ONE TOUCH TEST STRIP HOLDER) MISC as directed.      . carvedilol (COREG) 25 MG tablet Take 1 tablet (25 mg total) by mouth 2 (two) times daily with a meal. 180 tablet 3  . cloNIDine (CATAPRES) 0.1 MG tablet Take 1 tablet (0.1 mg total) by mouth daily as needed. Only if blood pressure is greater than 160/100 30 tablet 1  . ELIQUIS 5 MG TABS tablet TAKE 1 TABLET BY MOUTH TWICE A DAY 60 tablet 11  . EPINEPHrine (EPIPEN) 0.3 mg/0.3 mL DEVI Inject 0.3 mLs (0.3 mg total) into the muscle once. 1 Device 0  . hydrochlorothiazide (HYDRODIURIL) 25 MG tablet TAKE half a TABLET BY MOUTH DAILY FOR BLOOD PRESSURE 90 tablet 3  . losartan (COZAAR) 50 MG tablet Take 1 tablet (50 mg total) by mouth daily. 90 tablet 3  . metFORMIN (GLUCOPHAGE) 1000 MG tablet Take 1 tablet (1,000 mg total) by mouth 2 (two) times daily with a meal. 180 tablet 3  . multivitamin (THERAGRAN) per tablet Take 1 tablet by mouth daily. 30 tablet 11  . rOPINIRole (REQUIP) 0.25 MG tablet Take one tablet by mouth twice daily for restless leg syndrome 180 tablet 3  . traMADol (ULTRAM) 50 MG tablet TAKE 1 TABLET BY MOUTH TWICE A DAY AS NEEDED FOR PAIN 60 tablet 0  . glucose blood (ONE TOUCH TEST STRIPS) test strip Use as instructed-  supply enough for twice daily testing. 100 each 12  . MOVIPREP 100 G SOLR Take 1 kit (200 g total) by mouth once. 1 kit 0  . MOVIPREP 100 G SOLR Take 1 kit (200 g total) by mouth once. 1 kit 0  . omeprazole (PRILOSEC) 20 MG capsule Take 1 capsule (20 mg total) by mouth daily. 90 capsule 3   No current facility-administered medications for this visit.   Allergies  Allergen Reactions  . Hydrocodone Nausea And Vomiting  . Lisinopril  Review of Systems: Gen: Complains of weakness, fatigue, and difficulty sleeping. CV: Denies chest pain, angina, palpitations, syncope, orthopnea, PND, peripheral edema, and claudication. Resp: Denies dyspnea at rest, dyspnea with exercise, cough, sputum, wheezing, coughing up blood, and pleurisy. GI: Denies vomiting blood, jaundice, and fecal incontinence.   Denies dysphagia or odynophagia. GU : Denies urinary burning, blood in urine, urinary frequency, urinary hesitancy, nocturnal urination, and urinary incontinence. MS: Denies joint pain, limitation of movement, and swelling, stiffness, low back pain, extremity pain. Denies muscle weakness, cramps, atrophy.  Derm: Denies rash, itching, dry skin, hives, moles, warts, or unhealing ulcers.  Psych: Denies depression, anxiety, memory loss, suicidal ideation, hallucinations, paranoia, and confusion. Heme: Denies bruising, bleeding, and enlarged lymph nodes. Neuro:  Denies any headaches, dizziness, paresthesia Endo:  Denies any problems with thyroid, adrenal  LAB RESULTS: CBC 03/05/2014 had white blood count 5.9, hemoglobin 10.5, hematocrit 31.3, MCV 92. Studies:   Mm Screening Breast Tomo Bilateral  12/07/2014   CLINICAL DATA:  Screening. Malignant lumpectomy of the upper outer quadrant of the right breast in 2008. Remote benign excisional biopsies in both breasts in 1987.  EXAM: DIGITAL SCREENING BILATERAL MAMMOGRAM WITH 3D TOMO WITH CAD  COMPARISON:  Previous exam(s).  ACR Breast Density Category b: There  are scattered areas of fibroglandular density.  FINDINGS: There are no findings suspicious for malignancy. Expected post lumpectomy changes in the right breast. Images were processed with CAD.  IMPRESSION: No mammographic evidence of malignancy. A result letter of this screening mammogram will be mailed directly to the patient.  RECOMMENDATION: Screening mammogram in one year. (Code:SM-B-01Y)  BI-RADS CATEGORY  2: Benign.   Electronically Signed   By: Evangeline Dakin M.D.   On: 12/07/2014 17:13     Physical Exam: General: Pleasant, well developed ,blackfemale in no acute distress Head: Normocephalic and atraumatic Eyes:  sclerae anicteric, conjunctiva pink  Ears: Normal auditory acuity Lungs: Clear throughout to auscultation Heart: Regular rate and rhythm Abdomen: Soft, non distended, non-tender. No masses, no hepatomegaly. Normal bowel sounds Musculoskeletal: Symmetrical with no gross deformities  Extremities: No edema  Neurological: Alert oriented x 4, grossly nonfocal Psychological:  Alert and cooperative. Normal mood and affect  Assessment and Recommendations:  64 year old female with a change in bowel habits, anorexia, nausea, weight loss, and bloating referred for evaluation. Patient will be scheduled for CT of the abdomen and pelvis with contrast to evaluate an etiology for her weight loss. She will also be scheduled for an upper endoscopy to evaluate her dyspepsia, anorexia and nausea. She will be scheduled for a colonoscopy to evaluate for her diarrhea and bowel changes.The risks, benefits, and alternatives to endoscopy with possible biopsy and possible dilation were discussed with the patient and they consent to proceed. The risks, benefits, and alternatives to colonoscopy with possible biopsy and possible polypectomy were discussed with the patient and they consent to proceed. Will hold eliquis 2 days prior to endoscopic procedures - will instruct when and how to resume after  procedure. Benefits and risks of procedure explained including risks of bleeding, perforation, infection, missed lesions, reactions to medications and possible need for hospitalization and surgery for complications. Additional rare but real risk of stroke or other vascular clotting events off eliquis also explained and need to seek urgent help if any signs of these problems occur. Will communicate by phone or EMR with patient's  prescribing provider to confirm that holding eliquis is reasonable in this case.        [name], Cecille Rubin  P PA-C 12/10/2014,

## 2014-12-10 NOTE — Patient Instructions (Signed)
We have sent the following medications to your pharmacy for you to pick up at your convenience:  Omeprazole  You have been scheduled for an endoscopy and colonoscopy. Please follow the written instructions given to you at your visit today. Please pick up your prep supplies at the pharmacy within the next 1-3 days. If you use inhalers (even only as needed), please bring them with you on the day of your procedure. Your physician has requested that you go to www.startemmi.com and enter the access code given to you at your visit today. This web site gives a general overview about your procedure. However, you should still follow specific instructions given to you by our office regarding your preparation for the procedure.   You have been scheduled for a CT scan of the abdomen and pelvis at [facility] ([address]---this is in the same building as Press [facility]).   You are scheduled on 01/27/2015 at 1;30pm. You should arrive 15 minutes prior to your appointment time for registration. Please follow the written instructions below on the day of your exam:  WARNING: IF YOU ARE ALLERGIC TO IODINE/X-RAY DYE, PLEASE NOTIFY RADIOLOGY IMMEDIATELY AT [phone]! YOU WILL BE GIVEN A 13 HOUR PREMEDICATION PREP.  1) Do not eat or drink anything after 9:30am (4 hours prior to your test) 2) You have been given 2 bottles of oral contrast to drink. The solution may taste better if refrigerated, but do NOT add ice or any other liquid to this solution. Shake well before drinking.    Drink 1 bottle of contrast @ 11:30am (2 hours prior to your exam)  Drink 1 bottle of contrast @ 12:30am (1 hour prior to your exam)  You may take any medications as prescribed with a small amount of water except for the following: Metformin, Glucophage, Glucovance, Avandamet, Riomet, Fortamet, Actoplus Met, Janumet, Glumetza or Metaglip. The above medications must be held the day of the exam AND 48 hours after the  exam.  The purpose of you drinking the oral contrast is to aid in the visualization of your intestinal tract. The contrast solution may cause some diarrhea. Before your exam is started, you will be given a small amount of fluid to drink. Depending on your individual set of symptoms, you may also receive an intravenous injection of x-ray contrast/dye. Plan on being at [facility] for 30 minutes or long, depending on the type of exam you are having performed.  If you have any questions regarding your exam or if you need to reschedule, you may call the CT department at [phone] between the hours of 8:00 am and 5:00 pm, Monday-Friday.  ________________________________________________________________________

## 2014-12-10 NOTE — Progress Notes (Signed)
i agree with the above note, plan 

## 2014-12-11 ENCOUNTER — Telehealth: Payer: Self-pay | Admitting: *Deleted

## 2014-12-11 ENCOUNTER — Telehealth: Payer: Self-pay

## 2014-12-11 NOTE — Telephone Encounter (Signed)
Patient was given a letter yesterday for her work to get off for her procedure. Patient states the dates on the letter are for a Sunday. She needs a correct letter mailed to her.

## 2014-12-11 NOTE — Telephone Encounter (Signed)
  12/10/2014   RE: [name] DOB: [birth year] MRN: [id number]   Dear Dr. Caryl Comes,    We have scheduled the above patient for an endoscopic procedure. Our records show that she is on anticoagulation therapy.   Please advise as to how long the patient may come off her therapy of Eliquis prior to the procedure, which is scheduled for 01/18/2015.  Please fax back/ or route the completed form to [place] at [phone].   Sincerely,    Phillis Haggis

## 2014-12-12 LAB — TISSUE TRANSGLUTAMINASE, IGA: TISSUE TRANSGLUTAMINASE AB, IGA: 1 U/mL (ref <4)

## 2014-12-12 NOTE — Telephone Encounter (Signed)
Corrected letter and mailed.

## 2014-12-12 NOTE — Telephone Encounter (Signed)
She should hold for 24 hrs  She has prior TIA Thanks

## 2014-12-13 ENCOUNTER — Encounter: Payer: Self-pay | Admitting: *Deleted

## 2014-12-17 ENCOUNTER — Other Ambulatory Visit: Payer: Self-pay | Admitting: *Deleted

## 2014-12-17 DIAGNOSIS — I10 Essential (primary) hypertension: Secondary | ICD-10-CM

## 2014-12-17 DIAGNOSIS — E119 Type 2 diabetes mellitus without complications: Secondary | ICD-10-CM

## 2014-12-17 MED ORDER — METFORMIN HCL 1000 MG PO TABS: 1000.0000 mg | ORAL_TABLET | Freq: Two times a day (BID) | ORAL | Status: DC

## 2014-12-17 MED ORDER — ATORVASTATIN CALCIUM 40 MG PO TABS: ORAL_TABLET | ORAL | Status: DC

## 2014-12-17 MED ORDER — CARVEDILOL 25 MG PO TABS: 25.0000 mg | ORAL_TABLET | Freq: Two times a day (BID) | ORAL | Status: DC

## 2014-12-17 MED ORDER — ROPINIROLE HCL 0.25 MG PO TABS: ORAL_TABLET | ORAL | Status: DC

## 2014-12-17 NOTE — Telephone Encounter (Signed)
[facility]

## 2014-12-18 ENCOUNTER — Telehealth: Payer: Self-pay

## 2014-12-18 NOTE — Telephone Encounter (Signed)
Spoke to patient and let her know that per Dr. Caryl Comes, she should hold her Eliquis for 24 hours prior to her procedure.  Patient agreed.  I also told patient I would mail her a new work letter clarifying the day she needed off

## 2014-12-25 ENCOUNTER — Other Ambulatory Visit: Payer: Self-pay | Admitting: *Deleted

## 2014-12-25 ENCOUNTER — Telehealth: Payer: Self-pay | Admitting: *Deleted

## 2014-12-25 DIAGNOSIS — R112 Nausea with vomiting, unspecified: Secondary | ICD-10-CM

## 2014-12-25 NOTE — Telephone Encounter (Signed)
Rollene Fare, I spoke to Pe Ell in [place] and she will do limited dose contrast. Can you please have pt have a bmet drawn 3 days after CT scan? Thank you.

## 2014-12-25 NOTE — Telephone Encounter (Signed)
Spoke with Lattie Haw at [facility]. She wants to be sure that patient should have IV contrast since her Creatinine is going up. ?Limited dose. Please, advise

## 2014-12-25 NOTE — Telephone Encounter (Signed)
Lab in [facility]. Patient notified of recommendations. She will come for labs on 12/31/14.

## 2014-12-28 ENCOUNTER — Ambulatory Visit (INDEPENDENT_AMBULATORY_CARE_PROVIDER_SITE_OTHER)
Admission: RE | Admit: 2014-12-28 | Discharge: 2014-12-28 | Disposition: A | Discharge status: Home / Self Care | Payer: BLUE CROSS/BLUE SHIELD | Source: Ambulatory Visit | Attending: Physician Assistant | Admitting: Physician Assistant

## 2014-12-28 ENCOUNTER — Ambulatory Visit (INDEPENDENT_AMBULATORY_CARE_PROVIDER_SITE_OTHER): Payer: BLUE CROSS/BLUE SHIELD | Admitting: Internal Medicine

## 2014-12-28 ENCOUNTER — Encounter: Payer: Self-pay | Admitting: Internal Medicine

## 2014-12-28 VITALS — BP 130/78 | HR 78 | Temp 97.4°F | Resp 18 | Ht 66.0 in | Wt 174.2 lb

## 2014-12-28 DIAGNOSIS — R634 Abnormal weight loss: Secondary | ICD-10-CM | POA: Diagnosis not present

## 2014-12-28 DIAGNOSIS — R197 Diarrhea, unspecified: Secondary | ICD-10-CM | POA: Diagnosis not present

## 2014-12-28 DIAGNOSIS — L723 Sebaceous cyst: Secondary | ICD-10-CM

## 2014-12-28 DIAGNOSIS — E0821 Diabetes mellitus due to underlying condition with diabetic nephropathy: Secondary | ICD-10-CM

## 2014-12-28 DIAGNOSIS — R112 Nausea with vomiting, unspecified: Secondary | ICD-10-CM | POA: Diagnosis not present

## 2014-12-28 DIAGNOSIS — I10 Essential (primary) hypertension: Secondary | ICD-10-CM

## 2014-12-28 MED ORDER — IOHEXOL 300 MG/ML  SOLN
100.0000 mL | Freq: Once | INTRAMUSCULAR | Status: AC | PRN
  Administered 2014-12-28: 80 mL via INTRAVENOUS

## 2014-12-28 NOTE — Patient Instructions (Signed)
Keep appts as scheduled  Follow up in June for routine visit

## 2014-12-28 NOTE — Progress Notes (Signed)
Patient ID: [name], female   DOB: [birth year], 64 y.o.   MRN: [id number]    Facility  [facility]    Place of Service:   OFFICE    Allergies  Allergen Reactions  . Hydrocodone Nausea And Vomiting  . Lisinopril     Chief Complaint  Patient presents with  . Medical Management of Chronic Issues    1 month folow -up,discusss labs (copy pprinted)    HPI:  64 yo female seen today for f/u of weight loss. She saw GI and has CT abd/pel scheduled today as well as colonoscopy and EGD on May13th. Labs revealed anemia and reduced renal fxn. She will have repeat labs next week. Serum Celiac panel neg. A1c 6.6% in [facility]. She has loss of appetite, N/V/D. No abdominal pain. No bloody stool. Her weight fluctuates and is 174 lbs today but she did not remove shoes and is wearing jeans.  She has right nare bump x 6 mos. No bleeding. Needs derm eval.  Past Medical History  Diagnosis Date  . Hypertension   . Arthritis     osteoarthritis  . Diabetes mellitus     TYPE 2  . Breast cancer 2008    right, ER/PR -  . Hx of radiation therapy 04/04/07 to 05/20/07    right breast/6260 cGy  . Hypercholesterolemia   . CHF (congestive heart failure)   . Gastroparesis   . Nonspecific elevation of levels of transaminase or lactic acid dehydrogenase (LDH)   . Regional enteritis of unspecified site   . Restless legs syndrome (RLS)   . Vitamin deficiency   . Depression   . Anxiety   . Malignant neoplasm of breast (female), unspecified site   . Uterine prolapse without mention of vaginal wall prolapse   . Osteoarthrosis, unspecified whether generalized or localized, unspecified site   . Lumbago   . Urinary frequency   . TIA (transient ischemic attack)    Past Surgical History  Procedure Laterality Date  . Breast lumpectomy Right 11/17/06    re-excision 01/13/07  . Arthroplasty      left knee  . Wrist surgery      carpel tunnel  . Total knee arthroplasty Left 06/10/2011       . Abdominal hysterectomy   1997    TAH/BSO, ENDOMETRIAL SARCOMA  . Total knee arthroplasty Right 2006, 2009    Dr Para March  . Total knee arthroplasty Left 2008    Dr Para March  . Radical hysterectomy  1997   History   Social History  . Marital Status: Widowed    Spouse Name: N/A  . Number of Children: 4  . Years of Education: N/A   Occupational History  . [employer]    Social History Main Topics  . Smoking status: Former Smoker -- 0.50 packs/day for 20 years    Quit date: 09/08/1991  . Smokeless tobacco: Never Used  . Alcohol Use: No  . Drug Use: No  . Sexual Activity: Not on file   Other Topics Concern  . None   Social History Narrative     Medications: Patient's Medications  New Prescriptions   No medications on file  Previous Medications   ACETAMINOPHEN (TYLENOL) 650 MG CR TABLET    Take 650 mg by mouth every 8 (eight) hours as needed for pain.   AMLODIPINE (NORVASC) 10 MG TABLET    Take one tablet by mouth once daily   ATORVASTATIN (LIPITOR) 40 MG TABLET    Take one  tablet by mouth once daily for cholesterol   BLOOD GLUCOSE MONITORING SUPPL (ONE TOUCH TEST STRIP HOLDER) MISC    as directed.     CARVEDILOL (COREG) 25 MG TABLET    Take 1 tablet (25 mg total) by mouth 2 (two) times daily with a meal.   CLONIDINE (CATAPRES) 0.1 MG TABLET    Take 1 tablet (0.1 mg total) by mouth daily as needed. Only if blood pressure is greater than 160/100   ELIQUIS 5 MG TABS TABLET    TAKE 1 TABLET BY MOUTH TWICE A DAY   EPINEPHRINE (EPIPEN) 0.3 MG/0.3 ML DEVI    Inject 0.3 mLs (0.3 mg total) into the muscle once.   GLUCOSE BLOOD (ONE TOUCH TEST STRIPS) TEST STRIP    Use as instructed- supply enough for twice daily testing.   HYDROCHLOROTHIAZIDE (HYDRODIURIL) 25 MG TABLET    TAKE half a TABLET BY MOUTH DAILY FOR BLOOD PRESSURE   LOSARTAN (COZAAR) 50 MG TABLET    Take 1 tablet (50 mg total) by mouth daily.   METFORMIN (GLUCOPHAGE) 1000 MG TABLET    Take 1 tablet (1,000 mg total) by mouth 2 (two) times daily  with a meal.   MOVIPREP 100 G SOLR    Take 1 kit (200 g total) by mouth once.   MOVIPREP 100 G SOLR    Take 1 kit (200 g total) by mouth once.   MULTIVITAMIN (THERAGRAN) PER TABLET    Take 1 tablet by mouth daily.   OMEPRAZOLE (PRILOSEC) 20 MG CAPSULE    Take 1 capsule (20 mg total) by mouth daily.   ROPINIROLE (REQUIP) 0.25 MG TABLET    Take one tablet by mouth twice daily for restless leg syndrome   TRAMADOL (ULTRAM) 50 MG TABLET    TAKE 1 TABLET BY MOUTH TWICE A DAY AS NEEDED FOR PAIN  Modified Medications   No medications on file  Discontinued Medications   No medications on file     Review of Systems  Constitutional: Negative for fever, chills, diaphoresis, activity change, appetite change and fatigue.  HENT: Negative for ear pain and sore throat.   Eyes: Negative for visual disturbance.  Respiratory: Negative for cough, chest tightness and shortness of breath.   Cardiovascular: Negative for chest pain, palpitations and leg swelling.  Gastrointestinal: Negative for nausea, vomiting, abdominal pain, diarrhea, constipation and blood in stool.  Genitourinary: Negative for dysuria.  Musculoskeletal: Negative for arthralgias.  Skin:       Right nare lesion  Neurological: Negative for dizziness, tremors, numbness and headaches.  Psychiatric/Behavioral: Negative for sleep disturbance. The patient is not nervous/anxious.     Filed Vitals:   12/28/14 0836  BP: 130/78  Pulse: 78  Temp: 97.4 F (36.3 C)  TempSrc: Oral  Resp: 18  Height: $Remove'5\' 6"'GDKoYMu$  (1.676 m)  Weight: 174 lb 3.2 oz (79.017 kg)  SpO2: 98%   Body mass index is 28.13 kg/(m^2).  Physical Exam  Constitutional: She is oriented to person, place, and time. She appears well-developed and well-nourished.  HENT:  Mouth/Throat: Oropharynx is clear and moist. No oropharyngeal exudate.  Eyes: Pupils are equal, round, and reactive to light. No scleral icterus.  Neck: Neck supple. No tracheal deviation present. No thyromegaly  present.  Cardiovascular: Normal rate, regular rhythm, normal heart sounds and intact distal pulses.  Exam reveals no gallop and no friction rub.   No murmur heard. No LE edema b/l. no calf TTP. No carotid bruit b/l  Pulmonary/Chest: Effort normal  and breath sounds normal. No stridor. No respiratory distress. She has no wheezes. She has no rales.  Abdominal: Soft. Bowel sounds are normal. She exhibits no distension, no abdominal bruit and no mass. There is no hepatosplenomegaly. There is no tenderness. There is no rebound and no guarding.  Lymphadenopathy:    She has no cervical adenopathy.  Neurological: She is alert and oriented to person, place, and time. She has normal reflexes.  Skin: Skin is warm and dry. No rash noted.  Psychiatric: She has a normal mood and affect. Her behavior is normal. Judgment and thought content normal.     Labs reviewed: Appointment on 12/10/2014  Component Date Value Ref Range Status  . Sodium 12/10/2014 140  135 - 145 mEq/L Final  . Potassium 12/10/2014 3.3* 3.5 - 5.1 mEq/L Final  . Chloride 12/10/2014 106  96 - 112 mEq/L Final  . CO2 12/10/2014 24  19 - 32 mEq/L Final  . Glucose, Bld 12/10/2014 143* 70 - 99 mg/dL Final  . BUN 17/99/4050 34* 6 - 23 mg/dL Final  . Creatinine, Ser 12/10/2014 1.42* 0.40 - 1.20 mg/dL Final  . Calcium 03/60/1126 9.2  8.4 - 10.5 mg/dL Final  . GFR 86/48/5984 47.62* >60.00 mL/min Final  . WBC 12/10/2014 5.0  4.0 - 10.5 K/uL Final  . RBC 12/10/2014 3.48* 3.87 - 5.11 Mil/uL Final  . Hemoglobin 12/10/2014 10.9* 12.0 - 15.0 g/dL Final  . HCT 88/58/4186 32.4* 36.0 - 46.0 % Final  . MCV 12/10/2014 93.1  78.0 - 100.0 fl Final  . MCHC 12/10/2014 33.8  30.0 - 36.0 g/dL Final  . RDW 72/92/6728 15.4  11.5 - 15.5 % Final  . Platelets 12/10/2014 192.0  150.0 - 400.0 K/uL Final  . Neutrophils Relative % 12/10/2014 46.0  43.0 - 77.0 % Final  . Lymphocytes Relative 12/10/2014 41.1  12.0 - 46.0 % Final  . Monocytes Relative 12/10/2014  10.0  3.0 - 12.0 % Final  . Eosinophils Relative 12/10/2014 2.6  0.0 - 5.0 % Final  . Basophils Relative 12/10/2014 0.3  0.0 - 3.0 % Final  . Neutro Abs 12/10/2014 2.3  1.4 - 7.7 K/uL Final  . Lymphs Abs 12/10/2014 2.1  0.7 - 4.0 K/uL Final  . Monocytes Absolute 12/10/2014 0.5  0.1 - 1.0 K/uL Final  . Eosinophils Absolute 12/10/2014 0.1  0.0 - 0.7 K/uL Final  . Basophils Absolute 12/10/2014 0.0  0.0 - 0.1 K/uL Final  . IgA 12/10/2014 297  68 - 378 mg/dL Final      . Tissue Transglutaminase Ab, IgA 12/10/2014 1  <4 U/mL Final   Comment: Value Interpretation:   <4:   Antibody Not Detected  >=4:   Antibody Detected   Office Visit on 11/26/2014  Component Date Value Ref Range Status  . WBC 11/26/2014 5.0  3.4 - 10.8 x10E3/uL Final  . RBC 11/26/2014 3.64* 3.77 - 5.28 x10E6/uL Final  . Hemoglobin 11/26/2014 11.2  11.1 - 15.9 g/dL Final  . HCT 20/54/1092 33.4* 34.0 - 46.6 % Final  . MCV 11/26/2014 92  79 - 97 fL Final  . MCH 11/26/2014 30.8  26.6 - 33.0 pg Final  . MCHC 11/26/2014 33.5  31.5 - 35.7 g/dL Final  . RDW 73/31/0788 15.7* 12.3 - 15.4 % Final  . Platelets 11/26/2014 294  150 - 379 x10E3/uL Final  . Neutrophils Relative % 11/26/2014 50   Final  . Lymphs 11/26/2014 38   Final  . Monocytes 11/26/2014 9  Final  . Eos 11/26/2014 3   Final  . Wendi Snipes 11/26/2014 0   Final  . Neutrophils Absolute 11/26/2014 2.6  1.4 - 7.0 x10E3/uL Final  . Lymphocytes Absolute 11/26/2014 1.9  0.7 - 3.1 x10E3/uL Final  . Monocytes Absolute 11/26/2014 0.4  0.1 - 0.9 x10E3/uL Final  . Eosinophils Absolute 11/26/2014 0.1  0.0 - 0.4 x10E3/uL Final  . Basophils Absolute 11/26/2014 0.0  0.0 - 0.2 x10E3/uL Final  . Immature Granulocytes 11/26/2014 0   Final  . Immature Grans (Abs) 11/26/2014 0.0  0.0 - 0.1 x10E3/uL Final  . Hgb A1c MFr Bld 11/26/2014 6.6* 4.8 - 5.6 % Final   Comment:          Pre-diabetes: 5.7 - 6.4          Diabetes: >6.4          Glycemic control for adults with diabetes: <7.0   . Est.  average glucose Bld gHb Est-m* 11/26/2014 143   Final  . Glucose 11/26/2014 122* 65 - 99 mg/dL Final  . BUN 11/26/2014 23  8 - 27 mg/dL Final  . Creatinine, Ser 11/26/2014 1.30* 0.57 - 1.00 mg/dL Final  . GFR calc non Af Amer 11/26/2014 43* >59 mL/min/1.73 Final  . GFR calc Af Amer 11/26/2014 50* >59 mL/min/1.73 Final  . BUN/Creatinine Ratio 11/26/2014 18  11 - 26 Final  . Sodium 11/26/2014 142  134 - 144 mmol/L Final  . Potassium 11/26/2014 4.2  3.5 - 5.2 mmol/L Final  . Chloride 11/26/2014 104  97 - 108 mmol/L Final  . CO2 11/26/2014 20  18 - 29 mmol/L Final  . Calcium 11/26/2014 9.7  8.7 - 10.3 mg/dL Final  . Total Protein 11/26/2014 7.2  6.0 - 8.5 g/dL Final  . Albumin 11/26/2014 4.4  3.6 - 4.8 g/dL Final  . Globulin, Total 11/26/2014 2.8  1.5 - 4.5 g/dL Final  . Albumin/Globulin Ratio 11/26/2014 1.6  1.1 - 2.5 Final  . Bilirubin Total 11/26/2014 0.3  0.0 - 1.2 mg/dL Final  . Alkaline Phosphatase 11/26/2014 100  39 - 117 IU/L Final  . AST 11/26/2014 27  0 - 40 IU/L Final  . ALT 11/26/2014 22  0 - 32 IU/L Final  . TSH 11/26/2014 0.597  0.450 - 4.500 uIU/mL Final  . Lipase 11/26/2014 26  0 - 59 U/L Final  . Amylase 11/26/2014 91  31 - 124 U/L Final     Assessment/Plan   ICD-9-CM ICD-10-CM   1. Loss of weight - stable 783.21 R63.4   2. Frequent loose stools - possibly due to IBS 787.91 R19.7   3. Diabetes mellitus due to underlying condition with diabetic nephropathy - controlled on med 249.40 E08.21    583.81    4. Sebaceous cyst - new 706.2 L72.3   5. HYPERTENSION, BENIGN SYSTEMIC - controlled on med 401.1 I10    --will consider derm eval at next ov  --keep scheduled appt with GI  --f/u for imaging study today  --continue current meds as Rx  --will follow. May need anxiolytic to help with GI sx's if w/u neg. Keep appt in June  Elvin Banker S. Perlie Gold  [facility] [address] [place], [place] [address] [phone]  Office (Wednesdays and Fridays 8 AM - 5 PM) [phone] Cell (Monday-Friday 8 AM - 5 PM)

## 2014-12-31 ENCOUNTER — Other Ambulatory Visit (INDEPENDENT_AMBULATORY_CARE_PROVIDER_SITE_OTHER): Payer: BLUE CROSS/BLUE SHIELD

## 2014-12-31 DIAGNOSIS — R112 Nausea with vomiting, unspecified: Secondary | ICD-10-CM

## 2014-12-31 LAB — BASIC METABOLIC PANEL
BUN: 24 mg/dL — AB (ref 6–23)
CHLORIDE: 104 meq/L (ref 96–112)
CO2: 24 meq/L (ref 19–32)
CREATININE: 1.31 mg/dL — AB (ref 0.40–1.20)
Calcium: 9.4 mg/dL (ref 8.4–10.5)
GFR: 52.26 mL/min — AB (ref >60.00)
Glucose, Bld: 118 mg/dL — ABNORMAL HIGH (ref 70–99)
Potassium: 3.5 mEq/L (ref 3.5–5.1)
SODIUM: 138 meq/L (ref 135–145)

## 2015-01-14 ENCOUNTER — Telehealth: Payer: Self-pay | Admitting: Gastroenterology

## 2015-01-14 NOTE — Telephone Encounter (Signed)
Pt was given the requested information and will call back with any further concerns

## 2015-01-18 ENCOUNTER — Encounter: Payer: Self-pay | Admitting: Gastroenterology

## 2015-01-18 ENCOUNTER — Other Ambulatory Visit: Payer: Self-pay | Admitting: Gastroenterology

## 2015-01-18 ENCOUNTER — Ambulatory Visit (AMBULATORY_SURGERY_CENTER): Payer: BLUE CROSS/BLUE SHIELD | Admitting: Gastroenterology

## 2015-01-18 VITALS — BP 164/86 | HR 79 | Temp 97.6°F | Resp 19 | Ht 66.0 in | Wt 174.0 lb

## 2015-01-18 DIAGNOSIS — K2970 Gastritis, unspecified, without bleeding: Secondary | ICD-10-CM

## 2015-01-18 DIAGNOSIS — R197 Diarrhea, unspecified: Secondary | ICD-10-CM | POA: Diagnosis not present

## 2015-01-18 DIAGNOSIS — R634 Abnormal weight loss: Secondary | ICD-10-CM | POA: Diagnosis not present

## 2015-01-18 DIAGNOSIS — R112 Nausea with vomiting, unspecified: Secondary | ICD-10-CM | POA: Diagnosis not present

## 2015-01-18 DIAGNOSIS — D120 Benign neoplasm of cecum: Secondary | ICD-10-CM | POA: Diagnosis not present

## 2015-01-18 LAB — GLUCOSE, CAPILLARY
GLUCOSE-CAPILLARY: 71 mg/dL (ref 65–99)
Glucose-Capillary: 77 mg/dL (ref 65–99)

## 2015-01-18 MED ORDER — SODIUM CHLORIDE 0.9 % IV SOLN: 500.0000 mL | INTRAVENOUS | Status: DC

## 2015-01-18 NOTE — Progress Notes (Signed)
Stable to RR 

## 2015-01-18 NOTE — Progress Notes (Signed)
Called to room to assist during endoscopic procedure.  Patient ID and intended procedure confirmed with present staff. Received instructions for my participation in the procedure from the performing physician.  

## 2015-01-18 NOTE — Op Note (Signed)
[place]  [address] [place], [address]   COLONOSCOPY PROCEDURE REPORT  PATIENT: [name], [name]  MR#: [id number] BIRTHDATE: Jan 05, 1951 , 62  yrs. old GENDER: female ENDOSCOPIST: Milus Banister, MD PROCEDURE DATE:  01/18/2015 PROCEDURE:   Colonoscopy with biopsy and Colonoscopy, diagnostic First Screening Colonoscopy - Avg.  risk and is 50 yrs.  old or older - No.  Prior Negative Screening - Now for repeat screening. N/A  History of Adenoma - Now for follow-up colonoscopy & has been > or = to 3 yrs.  N/A  Polyps removed today? Yes ASA CLASS:   Class II INDICATIONS:chronic intermittent diarrhea, weight loss. MEDICATIONS: Monitored anesthesia care, Propofol 300 mg IV, and lidocaine 40mg  IV  DESCRIPTION OF PROCEDURE:   After the risks benefits and alternatives of the procedure were thoroughly explained, informed consent was obtained.  The digital rectal exam revealed no abnormalities of the rectum.   The Pentax Ped Colon [id number] endoscope was introduced through the anus and advanced to the terminal ileum which was intubated for a short distance. No adverse events experienced.   The quality of the prep was good.  The instrument was then slowly withdrawn as the colon was fully examined.   COLON FINDINGS: A sessile polyp measuring 2 mm in size was found at the cecum.  A polypectomy was performed with cold forceps.  The resection was complete, the polyp tissue was completely retrieved and sent to histology.   The examination was otherwise normal. The colon was randomly biopsied. The terminal ileum was normal.. Retroflexed views revealed no abnormalities. The time to cecum = 7.8 Withdrawal time = 6.4   The scope was withdrawn and the procedure completed. COMPLICATIONS: There were no immediate complications.  ENDOSCOPIC IMPRESSION: 1.   Sessile polyp was found at the cecum; polypectomy was performed with cold forceps 2.   The examination was otherwise  normal (colon was randomly biopsied)  RECOMMENDATIONS: If the polyp(s) removed today are proven to be adenomatous (pre-cancerous) polyps, you will need a repeat colonoscopy in 5 years.  Otherwise you should continue to follow colorectal cancer screening guidelines for "routine risk" patients with colonoscopy in 10 years.  You will receive a letter within 1-2 weeks with the results of your biopsy as well as final recommendations.  Please call my office if you have not received a letter after 3 weeks.  eSigned:  Milus Banister, MD 01/18/2015 2:07 PM   cc: Gildardo Cranker, MD

## 2015-01-18 NOTE — Op Note (Signed)
[place]  [address] [place], [address]   ENDOSCOPY PROCEDURE REPORT  PATIENT: [name], [name]  MR#: [id number] BIRTHDATE: 1951/05/23 , 50  yrs. old GENDER: female ENDOSCOPIST: Milus Banister, MD PROCEDURE DATE:  01/18/2015 PROCEDURE:  EGD w/ biopsy ASA CLASS:     Class II INDICATIONS:  intermittent nausea, vomiting, weight loss. MEDICATIONS: Monitored anesthesia care and Propofol 100 mg IV TOPICAL ANESTHETIC: none  DESCRIPTION OF PROCEDURE: After the risks benefits and alternatives of the procedure were thoroughly explained, informed consent was obtained.  The Pentax Gastroscope Peds [id number] endoscope was introduced through the mouth and advanced to the second portion of the duodenum , Without limitations.  The instrument was slowly withdrawn as the mucosa was fully examined.  There was mild to moderate pan-gastritis.  This was most significant in proximal stomach.  The stomach was biopsied and sent to pathology (distal and proximal).  The examination was otherwise normal.  Retroflexed views revealed no abnormalities.     The scope was then withdrawn from the patient and the procedure completed. COMPLICATIONS: There were no immediate complications.  ENDOSCOPIC IMPRESSION: There was mild to moderate pan-gastritis.  This was most significant in proximal stomach.  The stomach was biopsied and sent to pathology (distal and proximal).  The examination was otherwise normal  RECOMMENDATIONS: Await pathology results   eSigned:  Milus Banister, MD 01/18/2015 2:14 PM    CC: Gildardo Cranker, MD

## 2015-01-18 NOTE — Patient Instructions (Signed)
YOU HAD AN ENDOSCOPIC PROCEDURE TODAY AT [facility]:   Refer to the procedure report that was given to you for any specific questions about what was found during the examination.  If the procedure report does not answer your questions, please call your gastroenterologist to clarify.  If you requested that your care partner not be given the details of your procedure findings, then the procedure report has been included in a sealed envelope for you to review at your convenience later.  YOU SHOULD EXPECT: Some feelings of bloating in the abdomen. Passage of more gas than usual.  Walking can help get rid of the air that was put into your GI tract during the procedure and reduce the bloating. If you had a lower endoscopy (such as a colonoscopy or flexible sigmoidoscopy) you may notice spotting of blood in your stool or on the toilet paper. If you underwent a bowel prep for your procedure, you may not have a normal bowel movement for a few days.  Please Note:  You might notice some irritation and congestion in your nose or some drainage.  This is from the oxygen used during your procedure.  There is no need for concern and it should clear up in a day or so.  SYMPTOMS TO REPORT IMMEDIATELY:   Following lower endoscopy (colonoscopy or flexible sigmoidoscopy):  Excessive amounts of blood in the stool  Significant tenderness or worsening of abdominal pains  Swelling of the abdomen that is new, acute  Fever of 100F or higher   Following upper endoscopy (EGD)  Vomiting of blood or coffee ground material  New chest pain or pain under the shoulder blades  Painful or persistently difficult swallowing  New shortness of breath  Fever of 100F or higher  Black, tarry-looking stools  For urgent or emergent issues, a gastroenterologist can be reached at any hour by calling [phone].   DIET: Your first meal following the procedure should be a small meal and then it is ok to progress to  your normal diet. Heavy or fried foods are harder to digest and may make you feel nauseous or bloated.  Likewise, meals heavy in dairy and vegetables can increase bloating.  Drink plenty of fluids but you should avoid alcoholic beverages for 24 hours.  ACTIVITY:  You should plan to take it easy for the rest of today and you should NOT DRIVE or use heavy machinery until tomorrow (because of the sedation medicines used during the test).    FOLLOW UP: Our staff will call the number listed on your records the next business day following your procedure to check on you and address any questions or concerns that you may have regarding the information given to you following your procedure. If we do not reach you, we will leave a message.  However, if you are feeling well and you are not experiencing any problems, there is no need to return our call.  We will assume that you have returned to your regular daily activities without incident.  If any biopsies were taken you will be contacted by phone or by letter within the next 1-3 weeks.  Please call us at [phone] if you have not heard about the biopsies in 3 weeks.   SIGNATURES/CONFIDENTIALITY: You and/or your care partner have signed paperwork which will be entered into your electronic medical record.  These signatures attest to the fact that that the information above on your After Visit Summary has been reviewed and  is understood.  Full responsibility of the confidentiality of this discharge information lies with you and/or your care-partner.  Ok to restart Eliquis today  Continue all medications  Please read over handout about polyps and gastritis

## 2015-01-18 NOTE — Progress Notes (Signed)
Per Dr. Ardis Hughs, pt is ok to restart Eliquis today

## 2015-01-21 ENCOUNTER — Telehealth: Payer: Self-pay | Admitting: *Deleted

## 2015-01-21 NOTE — Telephone Encounter (Signed)
  Follow up Call-  Call back number 01/18/2015  Post procedure Call Back phone  # [phone]  Permission to leave phone message Yes     Patient questions:  Spoke with [facility], and she stated that she was fine.

## 2015-02-08 ENCOUNTER — Other Ambulatory Visit: Payer: BLUE CROSS/BLUE SHIELD

## 2015-02-08 DIAGNOSIS — E7800 Pure hypercholesterolemia, unspecified: Secondary | ICD-10-CM

## 2015-02-08 DIAGNOSIS — I10 Essential (primary) hypertension: Secondary | ICD-10-CM

## 2015-02-08 DIAGNOSIS — E0821 Diabetes mellitus due to underlying condition with diabetic nephropathy: Secondary | ICD-10-CM

## 2015-02-09 LAB — CBC WITH DIFFERENTIAL/PLATELET
BASOS ABS: 0 10*3/uL (ref 0.0–0.2)
Basos: 0 %
EOS (ABSOLUTE): 0.1 10*3/uL (ref 0.0–0.4)
EOS: 2 %
HEMATOCRIT: 29.1 % — AB (ref 34.0–46.6)
HEMOGLOBIN: 9.6 g/dL — AB (ref 11.1–15.9)
IMMATURE GRANS (ABS): 0 10*3/uL (ref 0.0–0.1)
Immature Granulocytes: 0 %
Lymphocytes Absolute: 2 10*3/uL (ref 0.7–3.1)
Lymphs: 45 %
MCH: 30.9 pg (ref 26.6–33.0)
MCHC: 33 g/dL (ref 31.5–35.7)
MCV: 94 fL (ref 79–97)
MONOCYTES: 10 %
MONOS ABS: 0.4 10*3/uL (ref 0.1–0.9)
NEUTROS ABS: 1.8 10*3/uL (ref 1.4–7.0)
NEUTROS PCT: 43 %
Platelets: 218 10*3/uL (ref 150–379)
RBC: 3.11 x10E6/uL — AB (ref 3.77–5.28)
RDW: 15.8 % — ABNORMAL HIGH (ref 12.3–15.4)
WBC: 4.3 10*3/uL (ref 3.4–10.8)

## 2015-02-09 LAB — COMPREHENSIVE METABOLIC PANEL
ALBUMIN: 4.2 g/dL (ref 3.6–4.8)
ALT: 23 IU/L (ref 0–32)
AST: 24 IU/L (ref 0–40)
Albumin/Globulin Ratio: 1.6 (ref 1.1–2.5)
Alkaline Phosphatase: 76 IU/L (ref 39–117)
BUN/Creatinine Ratio: 20 (ref 11–26)
BUN: 19 mg/dL (ref 8–27)
Bilirubin Total: 0.3 mg/dL (ref 0.0–1.2)
CALCIUM: 9 mg/dL (ref 8.7–10.3)
CHLORIDE: 106 mmol/L (ref 97–108)
CO2: 21 mmol/L (ref 18–29)
Creatinine, Ser: 0.96 mg/dL (ref 0.57–1.00)
GFR calc non Af Amer: 62 mL/min/{1.73_m2} (ref >59)
GFR, EST AFRICAN AMERICAN: 72 mL/min/{1.73_m2} (ref >59)
GLUCOSE: 79 mg/dL (ref 65–99)
Globulin, Total: 2.6 g/dL (ref 1.5–4.5)
POTASSIUM: 4 mmol/L (ref 3.5–5.2)
Sodium: 143 mmol/L (ref 134–144)
TOTAL PROTEIN: 6.8 g/dL (ref 6.0–8.5)

## 2015-02-09 LAB — LIPID PANEL
Chol/HDL Ratio: 1.9 ratio units (ref 0.0–4.4)
Cholesterol, Total: 179 mg/dL (ref 100–199)
HDL: 96 mg/dL (ref >39)
LDL Calculated: 74 mg/dL (ref 0–99)
Triglycerides: 47 mg/dL (ref 0–149)
VLDL Cholesterol Cal: 9 mg/dL (ref 5–40)

## 2015-02-09 LAB — HEMOGLOBIN A1C
Est. average glucose Bld gHb Est-mCnc: 148 mg/dL
HEMOGLOBIN A1C: 6.8 % — AB (ref 4.8–5.6)

## 2015-02-12 ENCOUNTER — Encounter: Payer: BLUE CROSS/BLUE SHIELD | Admitting: Internal Medicine

## 2015-02-13 ENCOUNTER — Encounter: Payer: BLUE CROSS/BLUE SHIELD | Admitting: Internal Medicine

## 2015-04-05 ENCOUNTER — Ambulatory Visit (INDEPENDENT_AMBULATORY_CARE_PROVIDER_SITE_OTHER): Payer: BLUE CROSS/BLUE SHIELD | Admitting: Internal Medicine

## 2015-04-05 ENCOUNTER — Encounter: Payer: Self-pay | Admitting: Internal Medicine

## 2015-04-05 VITALS — BP 140/70 | HR 69 | Temp 97.7°F | Resp 20 | Ht 66.0 in | Wt 171.6 lb

## 2015-04-05 DIAGNOSIS — G2581 Restless legs syndrome: Secondary | ICD-10-CM | POA: Diagnosis not present

## 2015-04-05 DIAGNOSIS — D638 Anemia in other chronic diseases classified elsewhere: Secondary | ICD-10-CM | POA: Diagnosis not present

## 2015-04-05 DIAGNOSIS — E0821 Diabetes mellitus due to underlying condition with diabetic nephropathy: Secondary | ICD-10-CM

## 2015-04-05 DIAGNOSIS — M1990 Unspecified osteoarthritis, unspecified site: Secondary | ICD-10-CM | POA: Diagnosis not present

## 2015-04-05 DIAGNOSIS — I10 Essential (primary) hypertension: Secondary | ICD-10-CM | POA: Diagnosis not present

## 2015-04-05 DIAGNOSIS — R197 Diarrhea, unspecified: Secondary | ICD-10-CM | POA: Diagnosis not present

## 2015-04-05 DIAGNOSIS — Z0000 Encounter for general adult medical examination without abnormal findings: Secondary | ICD-10-CM | POA: Diagnosis not present

## 2015-04-05 NOTE — Progress Notes (Signed)
Passed clock test 

## 2015-04-05 NOTE — Patient Instructions (Addendum)
Encouraged her to exercise 30-45 minutes 4-5 times per week. Eat a well balanced diet. Avoid smoking. Limit alcohol intake. Wear seatbelt when riding in the car. Wear sun block (SPF >50) when spending extended times outside.  Start iron supplemt OTC daily  continue other medications as ordered  Follow up in 6 mos for routine visit (fasting labs 2-3 days prior to appt)

## 2015-04-05 NOTE — Progress Notes (Signed)
Patient ID: [name], female   DOB: 11-Sep-1950, 64 y.o.   MRN: [id number] Subjective:     [name] is a 64 y.o. female and is here for a comprehensive physical exam. The patient reports problems - intermittent N/V/D. she notices it more when she gets stressed. GI w/u thus far has been negative.  History   Past Medical History  Diagnosis Date  . Hypertension   . Arthritis     osteoarthritis  . Diabetes mellitus     TYPE 2  . Breast cancer 2008    right, ER/PR -  . Hx of radiation therapy 04/04/07 to 05/20/07    right breast/6260 cGy  . Hypercholesterolemia   . CHF (congestive heart failure)   . Gastroparesis   . Nonspecific elevation of levels of transaminase or lactic acid dehydrogenase (LDH)   . Regional enteritis of unspecified site   . Restless legs syndrome (RLS)   . Vitamin deficiency   . Depression   . Anxiety   . Malignant neoplasm of breast (female), unspecified site   . Uterine prolapse without mention of vaginal wall prolapse   . Osteoarthrosis, unspecified whether generalized or localized, unspecified site   . Lumbago   . Urinary frequency   . TIA (transient ischemic attack)    Past Surgical History  Procedure Laterality Date  . Breast lumpectomy Right 11/17/06    re-excision 01/13/07  . Arthroplasty      left knee  . Wrist surgery      carpel tunnel  . Total knee arthroplasty Left 06/10/2011       . Abdominal hysterectomy  1997    TAH/BSO, ENDOMETRIAL SARCOMA  . Total knee arthroplasty Right 2006, 2009    Dr Para March  . Total knee arthroplasty Left 2008    Dr Para March  . Radical hysterectomy  1997   Family History  Problem Relation Age of Onset  . Diabetes Mother   . Diabetes Father   . Aneurysm Sister   . Arthritis Sister   . Diabetes Brother   . Heart Problems Brother   . Diabetes Brother   . Colon cancer Neg Hx     Social History  . Marital Status: Widowed    Spouse Name: N/A  . Number of Children: 4  . Years of Education: N/A     Occupational History  . [employer]    Social History Main Topics  . Smoking status: Former Smoker -- 0.50 packs/day for 20 years    Quit date: 09/08/1991  . Smokeless tobacco: Never Used  . Alcohol Use: No  . Drug Use: No  . Sexual Activity: Not on file   Other Topics Concern  . Not on file   Social History Narrative   Health Maintenance  Topic Date Due  . FOOT EXAM  02/12/1959  . DEXA SCAN  02/11/2014  . OPHTHALMOLOGY EXAM  12/10/2014  . PNA vac Low Risk Adult (2 of 2 - PPSV23) 03/08/2015  . URINE MICROALBUMIN  03/13/2015  . INFLUENZA VACCINE  04/08/2015  . HEMOGLOBIN A1C  08/10/2015  . MAMMOGRAM  12/06/2016  . TETANUS/TDAP  11/30/2023  . COLONOSCOPY  01/17/2025  . ZOSTAVAX  Completed    Review of Systems   Review of Systems  Constitutional: Negative for fever, chills and malaise/fatigue.  HENT: Negative for sore throat and tinnitus.   Eyes: Negative for blurred vision and double vision.  Respiratory: Negative for cough, shortness of breath and wheezing.   Cardiovascular: Negative  for chest pain, palpitations, orthopnea and leg swelling.  Gastrointestinal: Positive for heartburn, nausea, vomiting and diarrhea. Negative for abdominal pain, constipation and blood in stool.  Genitourinary: Negative for dysuria, urgency, frequency and hematuria.  Musculoskeletal: Negative for myalgias, joint pain and falls.  Skin: Negative for rash.  Neurological: Negative for dizziness, tingling, tremors, sensory change, focal weakness, seizures, loss of consciousness, weakness and headaches.  Endo/Heme/Allergies: Negative for environmental allergies. Does not bruise/bleed easily.  Psychiatric/Behavioral: Negative for depression and memory loss. The patient is nervous/anxious. The patient does not have insomnia.      Objective:      Physical Exam  Constitutional: She is oriented to person, place, and time and well-developed, well-nourished, and in no distress.  HENT:  Head:  Normocephalic and atraumatic.  Right Ear: External ear normal.  Left Ear: External ear normal.  Mouth/Throat: Oropharynx is clear and moist. No oropharyngeal exudate.  Eyes: Conjunctivae and EOM are normal. Pupils are equal, round, and reactive to light. No scleral icterus.  Neck: Normal range of motion. Neck supple. Carotid bruit is not present. No tracheal deviation present. No thyromegaly present.  Cardiovascular: Normal rate, regular rhythm, normal heart sounds and intact distal pulses.  Exam reveals no gallop and no friction rub.   No murmur heard. Pulmonary/Chest: Effort normal and breath sounds normal. She has no wheezes. She has no rhonchi. She has no rales. She exhibits no tenderness. Right breast exhibits skin change (scar tissue right lateral breast due to surgical scar). Right breast exhibits no inverted nipple, no mass, no nipple discharge and no tenderness. Left breast exhibits no inverted nipple, no mass, no nipple discharge, no skin change and no tenderness. Breasts are asymmetrical (due to right surgical scar).    Abdominal: Soft. Bowel sounds are normal. She exhibits no distension and no mass. There is no hepatosplenomegaly. There is no tenderness. There is no rebound and no guarding.  Genitourinary:  Deferred to GYN  Musculoskeletal: She exhibits edema.  Lymphadenopathy:    She has no cervical adenopathy.  Neurological: She is alert and oriented to person, place, and time. Gait normal.  reduced DTRs in knee b/l due to TKR  Skin: Skin is warm and dry. No rash noted.  B/l LE soft varicose veins, NT  Psychiatric: Mood, memory, affect and judgment normal.   Diabetic Foot Exam - Simple   Simple Foot Form  Diabetic Foot exam was performed with the following findings:  Yes 04/05/2015  1:13 PM  Visual Inspection  See comments:  Yes  Sensation Testing  Intact to touch and monofilament testing bilaterally:  Yes  Pulse Check  Posterior Tibialis and Dorsalis pulse intact  bilaterally:  Yes  Comments  Bunion b/l. No calluses or ulcerations. B/l LE varicose veins         MMSE - Mini Mental State Exam 04/05/2015  Orientation to time 5  Orientation to Place 5  Registration 3  Attention/ Calculation 4  Recall 2  Language- name 2 objects 2  Language- repeat 1  Language- follow 3 step command 3  Language- read & follow direction 1  Write a sentence 1  Copy design 1  Total score 28      Recent Results (from the past 2160 hour(s))  Glucose, capillary     Status: None   Collection Time: 01/18/15  1:14 PM  Result Value Ref Range   Glucose-Capillary 77 65 - 99 mg/dL  Glucose, capillary     Status: None   Collection Time: 01/18/15  2:21 PM  Result Value Ref Range   Glucose-Capillary 71 65 - 99 mg/dL  CMP     Status: None   Collection Time: 02/08/15  8:04 AM  Result Value Ref Range   Glucose 79 65 - 99 mg/dL   BUN 19 8 - 27 mg/dL   Creatinine, Ser 0.96 0.57 - 1.00 mg/dL   GFR calc non Af Amer 62 >59 mL/min/1.73   GFR calc Af Amer 72 >59 mL/min/1.73   BUN/Creatinine Ratio 20 11 - 26   Sodium 143 134 - 144 mmol/L   Potassium 4.0 3.5 - 5.2 mmol/L   Chloride 106 97 - 108 mmol/L   CO2 21 18 - 29 mmol/L   Calcium 9.0 8.7 - 10.3 mg/dL   Total Protein 6.8 6.0 - 8.5 g/dL   Albumin 4.2 3.6 - 4.8 g/dL   Globulin, Total 2.6 1.5 - 4.5 g/dL   Albumin/Globulin Ratio 1.6 1.1 - 2.5   Bilirubin Total 0.3 0.0 - 1.2 mg/dL   Alkaline Phosphatase 76 39 - 117 IU/L   AST 24 0 - 40 IU/L   ALT 23 0 - 32 IU/L  CBC with Differential     Status: Abnormal   Collection Time: 02/08/15  8:04 AM  Result Value Ref Range   WBC 4.3 3.4 - 10.8 x10E3/uL   RBC 3.11 (L) 3.77 - 5.28 x10E6/uL   Hemoglobin 9.6 (L) 11.1 - 15.9 g/dL   Hematocrit 29.1 (L) 34.0 - 46.6 %   MCV 94 79 - 97 fL   MCH 30.9 26.6 - 33.0 pg   MCHC 33.0 31.5 - 35.7 g/dL   RDW 15.8 (H) 12.3 - 15.4 %   Platelets 218 150 - 379 x10E3/uL   Neutrophils 43 %   Lymphs 45 %   Monocytes 10 %   Eos 2 %   Basos 0  %   Neutrophils Absolute 1.8 1.4 - 7.0 x10E3/uL   Lymphocytes Absolute 2.0 0.7 - 3.1 x10E3/uL   Monocytes Absolute 0.4 0.1 - 0.9 x10E3/uL   EOS (ABSOLUTE) 0.1 0.0 - 0.4 x10E3/uL   Basophils Absolute 0.0 0.0 - 0.2 x10E3/uL   Immature Granulocytes 0 %   Immature Grans (Abs) 0.0 0.0 - 0.1 x10E3/uL  Lipid Panel     Status: None   Collection Time: 02/08/15  8:04 AM  Result Value Ref Range   Cholesterol, Total 179 100 - 199 mg/dL   Triglycerides 47 0 - 149 mg/dL   HDL 96 >39 mg/dL    Comment: According to ATP-III Guidelines, HDL-C >59 mg/dL is considered a negative risk factor for CHD.    VLDL Cholesterol Cal 9 5 - 40 mg/dL   LDL Calculated 74 0 - 99 mg/dL   Chol/HDL Ratio 1.9 0.0 - 4.4 ratio units    Comment:                                   T. Chol/HDL Ratio                                             Men  Women                               1/2 Avg.Risk  3.4  3.3                                   Avg.Risk  5.0    4.4                                2X Avg.Risk  9.6    7.1                                3X Avg.Risk 23.4   11.0   Hemoglobin A1c     Status: Abnormal   Collection Time: 02/08/15  8:04 AM  Result Value Ref Range   Hgb A1c MFr Bld 6.8 (H) 4.8 - 5.6 %    Comment:          Pre-diabetes: 5.7 - 6.4          Diabetes: >6.4          Glycemic control for adults with diabetes: <7.0    Est. average glucose Bld gHb Est-mCnc 148 mg/dL    Assessment:    Healthy female exam.       ICD-9-CM ICD-10-CM   1. Well adult exam V70.0 Z00.00   2. Frequent loose stools - probably due to IBS 787.91 R19.7   3. HYPERTENSION, BENIGN SYSTEMIC - controlled 401.1 I10   4. Diabetes mellitus due to underlying condition with diabetic nephropathy - controlled 249.40 E08.21    583.81    5. Osteoarthritis, unspecified osteoarthritis type, unspecified site -stable 715.90 M19.90   6. Restless leg syndrome - controlled 333.94 G25.81   7. Anemia, chronic disease - stable 285.29 D63.8       Plan:     See After Visit Summary for Counseling Recommendations   --Pt is UTD on health maintenance. Vaccinations are UTD. Pt maintains a healthy lifestyle. Encouraged pt to exercise 30-45 minutes 4-5 times per week. Eat a well balanced diet. Avoid smoking. Limit alcohol intake. Wear seatbelt when riding in the car. Wear sun block (SPF >50) when spending extended times outside.  --Medically stable to participate in exercise programs. Recommend personal trainer if she plans to lift weights  --Start iron supplemt OTC daily  --continue other medications as ordered  --Follow up in 6 mos for routine visit (fasting labs CBC w diff, CMP, lipid panel, A1c, urine micro/creatinine ratio 2-3 days prior to appt)  Cordella Register. Perlie Gold  [facility] [address] [place], [place] [address] [phone] Cell (Monday-Friday 8 AM - 5 PM) [phone] After 5 PM and follow prompts

## 2015-05-07 ENCOUNTER — Other Ambulatory Visit: Payer: Self-pay | Admitting: Internal Medicine

## 2015-05-20 ENCOUNTER — Other Ambulatory Visit: Payer: Self-pay

## 2015-05-20 ENCOUNTER — Other Ambulatory Visit: Payer: Self-pay | Admitting: *Deleted

## 2015-05-20 MED ORDER — APIXABAN 5 MG PO TABS: 5.0000 mg | ORAL_TABLET | Freq: Two times a day (BID) | ORAL | Status: DC

## 2015-05-20 MED ORDER — LOSARTAN POTASSIUM 50 MG PO TABS: ORAL_TABLET | ORAL | Status: DC

## 2015-05-20 MED ORDER — AMLODIPINE BESYLATE 10 MG PO TABS: ORAL_TABLET | ORAL | Status: DC

## 2015-05-20 NOTE — Telephone Encounter (Signed)
Refill sent for eliquis  

## 2015-05-20 NOTE — Telephone Encounter (Signed)
[employer].

## 2015-06-03 ENCOUNTER — Telehealth: Payer: Self-pay | Admitting: *Deleted

## 2015-06-03 NOTE — Telephone Encounter (Signed)
Received Rx from [facility] [phone] for Dispensing Order to be signed for [place] Mastectomy Camisole #4, L8000 Post Surgical Bras #7, G9842 Non Silicone Breast Prosthesis, J0312 Silicone Breast Prosthesis  Given to Dr. Eulas Post to review and sign. To be faxed back to #[phone]

## 2015-07-05 ENCOUNTER — Ambulatory Visit (INDEPENDENT_AMBULATORY_CARE_PROVIDER_SITE_OTHER): Payer: BLUE CROSS/BLUE SHIELD | Admitting: Internal Medicine

## 2015-07-05 ENCOUNTER — Encounter: Payer: Self-pay | Admitting: Internal Medicine

## 2015-07-05 VITALS — BP 132/70 | HR 79 | Temp 98.1°F | Resp 18 | Ht 66.0 in | Wt 176.4 lb

## 2015-07-05 DIAGNOSIS — Z23 Encounter for immunization: Secondary | ICD-10-CM

## 2015-07-05 DIAGNOSIS — F40240 Claustrophobia: Secondary | ICD-10-CM

## 2015-07-05 NOTE — Progress Notes (Signed)
Patient ID: [name], female   DOB: 12-22-1950, 64 y.o.   MRN: [id number]    Location:    [facility]   Place of Service:   OFFICE  Chief Complaint  Patient presents with  . Medical Management of Chronic Issues    Needs letter for job of closed spaces  . Immunizations    flu shot today    HPI:  64 yo female seen today to discuss claustrophobia. 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 She requires to work in an open area. She once had to have surgery and when she walked into the OR and the door closed, she had a panic attack and had to be taken out. She was given medicine which helped and she was able to go back to the OR for the procedure without an issue. Sx's include inability to comprehend, speak, memory loss, CP, SOB and sensation of doom.  Past Medical History  Diagnosis Date  . Hypertension   . Arthritis     osteoarthritis  . Diabetes mellitus     TYPE 2  . Breast cancer ([place]) 2008    right, ER/PR -  . Hx of radiation therapy 04/04/07 to 05/20/07    right breast/6260 cGy  . Hypercholesterolemia   . CHF (congestive heart failure) ([place])   . Gastroparesis   . Nonspecific elevation of levels of transaminase or lactic acid dehydrogenase (LDH)   . Regional enteritis of unspecified site   . Restless legs syndrome (RLS)   . Vitamin deficiency   . Depression   . Anxiety   . Malignant neoplasm of breast (female), unspecified site   . Uterine prolapse without mention of vaginal wall prolapse   . Osteoarthrosis, unspecified whether generalized or localized, unspecified site   . Lumbago   . Urinary frequency   . TIA (transient ischemic attack)     Past Surgical History  Procedure Laterality Date  . Breast lumpectomy Right 11/17/06    re-excision 01/13/07  . Arthroplasty      left knee  . Wrist surgery      carpel tunnel  . Total knee arthroplasty Left 06/10/2011       . Abdominal hysterectomy   1997    TAH/BSO, ENDOMETRIAL SARCOMA  . Total knee arthroplasty Right 2006, 2009    Dr Para March  . Total knee arthroplasty Left 2008    Dr Para March  . Radical hysterectomy  1997    Patient Care Team: Gildardo Cranker, DO as PCP - General (Internal Medicine)  Social History   Social History  . Marital Status: Widowed    Spouse Name: N/A  . Number of Children: 4  . Years of Education: N/A   Occupational History  . [employer]    Social History Main Topics  . Smoking status: Former Smoker -- 0.50 packs/day for 20 years    Quit date: 09/08/1991  . Smokeless tobacco: Never Used  . Alcohol Use: No  . Drug Use: No  . Sexual Activity: Not on file   Other Topics Concern  . Not on file   Social History Narrative     reports that she quit smoking about 23 years ago. She has never used smokeless tobacco. She reports that she does not drink alcohol or use illicit drugs.  Allergies  Allergen Reactions  . Hydrocodone Nausea And Vomiting  . Lisinopril     Medications: Patient's Medications  New Prescriptions   No medications on file  Previous Medications   ACETAMINOPHEN (TYLENOL) 650 MG CR TABLET    Take 650 mg by mouth every 8 (eight) hours as needed for pain.   AMLODIPINE (NORVASC) 10 MG TABLET    Take one tablet by mouth once daily to control blood pressure   APIXABAN (ELIQUIS) 5 MG TABS TABLET    Take 1 tablet (5 mg total) by mouth 2 (two) times daily.   ATORVASTATIN (LIPITOR) 40 MG TABLET    Take one tablet by mouth once daily for cholesterol   BLOOD GLUCOSE MONITORING SUPPL (ONE TOUCH TEST STRIP HOLDER) MISC    as directed.     CARVEDILOL (COREG) 25 MG TABLET    Take 1 tablet (25 mg total) by mouth 2 (two) times daily with a meal.   CLONIDINE (CATAPRES) 0.1 MG TABLET    Take 1 tablet (0.1 mg total) by mouth daily as needed. Only if blood pressure is greater than 160/100   EPINEPHRINE (EPIPEN) 0.3 MG/0.3 ML DEVI    Inject 0.3 mLs (0.3 mg total) into the muscle once.    GLUCOSE BLOOD (ONE TOUCH TEST STRIPS) TEST STRIP    Use as instructed- supply enough for twice daily testing.   HYDROCHLOROTHIAZIDE (HYDRODIURIL) 25 MG TABLET    TAKE half a TABLET BY MOUTH DAILY FOR BLOOD PRESSURE   LOSARTAN (COZAAR) 50 MG TABLET    Take one tablet by mouth once daily to control blood pressure   METFORMIN (GLUCOPHAGE) 1000 MG TABLET    Take 1 tablet (1,000 mg total) by mouth 2 (two) times daily with a meal.   MULTIVITAMIN (THERAGRAN) PER TABLET    Take 1 tablet by mouth daily.   ROPINIROLE (REQUIP) 0.25 MG TABLET    Take one tablet by mouth twice daily for restless leg syndrome  Modified Medications   No medications on file  Discontinued Medications   OMEPRAZOLE (PRILOSEC) 20 MG CAPSULE    Take 1 capsule (20 mg total) by mouth daily.    Review of Systems  Constitutional: Negative for fever, chills, diaphoresis, activity change, appetite change and fatigue.  HENT: Negative for ear pain and sore throat.   Eyes: Negative for visual disturbance.  Respiratory: Negative for cough, chest tightness and shortness of breath.   Cardiovascular: Negative for chest pain, palpitations and leg swelling.  Gastrointestinal: Negative for nausea, vomiting, abdominal pain, diarrhea, constipation and blood in stool.  Genitourinary: Negative for dysuria.  Musculoskeletal: Positive for arthralgias.  Neurological: Negative for dizziness, tremors, numbness and headaches.  Psychiatric/Behavioral: Negative for sleep disturbance and agitation. The patient is nervous/anxious.     Filed Vitals:   07/05/15 0825  BP: 132/70  Pulse: 79  Temp: 98.1 F (36.7 C)  TempSrc: Oral  Resp: 18  Height: 5\' 6"  (1.676 m)  Weight: 176 lb 6.4 oz (80.015 kg)  SpO2: 98%   Body mass index is 28.49 kg/(m^2).  Physical Exam  Constitutional: She is oriented to person, place, and time. She appears well-developed and well-nourished. No distress.  Neurological: She is alert and oriented to person, place, and time.    Skin: Skin is warm and dry. No rash noted.  Psychiatric: Her behavior is normal. Thought content normal. Her mood appears anxious.     Labs reviewed: No visits with results within 3 Month(s) from this visit. Latest known visit with results is:  Appointment on 02/08/2015  Component Date Value Ref Range Status  . Glucose 02/08/2015 79  65 -  99 mg/dL Final  . BUN 02/08/2015 19  8 - 27 mg/dL Final  . Creatinine, Ser 02/08/2015 0.96  0.57 - 1.00 mg/dL Final  . GFR calc non Af Amer 02/08/2015 62  >59 mL/min/1.73 Final  . GFR calc Af Amer 02/08/2015 72  >59 mL/min/1.73 Final  . BUN/Creatinine Ratio 02/08/2015 20  11 - 26 Final  . Sodium 02/08/2015 143  134 - 144 mmol/L Final  . Potassium 02/08/2015 4.0  3.5 - 5.2 mmol/L Final  . Chloride 02/08/2015 106  97 - 108 mmol/L Final  . CO2 02/08/2015 21  18 - 29 mmol/L Final  . Calcium 02/08/2015 9.0  8.7 - 10.3 mg/dL Final  . Total Protein 02/08/2015 6.8  6.0 - 8.5 g/dL Final  . Albumin 02/08/2015 4.2  3.6 - 4.8 g/dL Final  . Globulin, Total 02/08/2015 2.6  1.5 - 4.5 g/dL Final  . Albumin/Globulin Ratio 02/08/2015 1.6  1.1 - 2.5 Final  . Bilirubin Total 02/08/2015 0.3  0.0 - 1.2 mg/dL Final  . Alkaline Phosphatase 02/08/2015 76  39 - 117 IU/L Final  . AST 02/08/2015 24  0 - 40 IU/L Final  . ALT 02/08/2015 23  0 - 32 IU/L Final  . WBC 02/08/2015 4.3  3.4 - 10.8 x10E3/uL Final  . RBC 02/08/2015 3.11* 3.77 - 5.28 x10E6/uL Final  . Hemoglobin 02/08/2015 9.6* 11.1 - 15.9 g/dL Final  . Hematocrit 02/08/2015 29.1* 34.0 - 46.6 % Final  . MCV 02/08/2015 94  79 - 97 fL Final  . MCH 02/08/2015 30.9  26.6 - 33.0 pg Final  . MCHC 02/08/2015 33.0  31.5 - 35.7 g/dL Final  . RDW 02/08/2015 15.8* 12.3 - 15.4 % Final  . Platelets 02/08/2015 218  150 - 379 x10E3/uL Final  . Neutrophils 02/08/2015 43   Final  . Lymphs 02/08/2015 45   Final  . Monocytes 02/08/2015 10   Final  . Eos 02/08/2015 2   Final  . Basos 02/08/2015 0   Final  . Neutrophils Absolute  02/08/2015 1.8  1.4 - 7.0 x10E3/uL Final  . Lymphocytes Absolute 02/08/2015 2.0  0.7 - 3.1 x10E3/uL Final  . Monocytes Absolute 02/08/2015 0.4  0.1 - 0.9 x10E3/uL Final  . EOS (ABSOLUTE) 02/08/2015 0.1  0.0 - 0.4 x10E3/uL Final  . Basophils Absolute 02/08/2015 0.0  0.0 - 0.2 x10E3/uL Final  . Immature Granulocytes 02/08/2015 0   Final  . Immature Grans (Abs) 02/08/2015 0.0  0.0 - 0.1 x10E3/uL Final  . Cholesterol, Total 02/08/2015 179  100 - 199 mg/dL Final  . Triglycerides 02/08/2015 47  0 - 149 mg/dL Final  . HDL 02/08/2015 96  >39 mg/dL Final   Comment: According to ATP-III Guidelines, HDL-C >59 mg/dL is considered a negative risk factor for CHD.   [name] Kitchen VLDL Cholesterol Cal 02/08/2015 9  5 - 40 mg/dL Final  . LDL Calculated 02/08/2015 74  0 - 99 mg/dL Final  . Chol/HDL Ratio 02/08/2015 1.9  0.0 - 4.4 ratio units Final   Comment:                                   T. Chol/HDL Ratio  Men  Women                               1/2 Avg.Risk  3.4    3.3                                   Avg.Risk  5.0    4.4                                2X Avg.Risk  9.6    7.1                                3X Avg.Risk 23.4   11.0   . Hgb A1c MFr Bld 02/08/2015 6.8* 4.8 - 5.6 % Final   Comment:          Pre-diabetes: 5.7 - 6.4          Diabetes: >6.4          Glycemic control for adults with diabetes: <7.0   . Est. average glucose Bld gHb Est-m* 02/08/2015 148   Final    No results found.   Assessment/Plan   ICD-9-CM ICD-10-CM   1. Claustrophobia - since childhood 300.29 F40.240   2. Encounter for immunization Z23 Z23     Letter to Employer completed  Follow up as scheduled  Flu shot given today  Meilyn Heindl S. Perlie Gold  [facility] [address] [place], [place] [address] [phone] Cell (Monday-Friday 8 AM - 5 PM) [phone] After 5 PM and follow prompts

## 2015-07-05 NOTE — Patient Instructions (Signed)
Letter to Employer completed  Follow up as scheduled  Flu shot given today

## 2015-07-15 ENCOUNTER — Other Ambulatory Visit: Payer: Self-pay | Admitting: Internal Medicine

## 2015-07-15 ENCOUNTER — Other Ambulatory Visit: Payer: Self-pay

## 2015-07-15 MED ORDER — LOSARTAN POTASSIUM 50 MG PO TABS: ORAL_TABLET | ORAL | Status: DC

## 2015-07-15 NOTE — Telephone Encounter (Signed)
Had to fax over the prescriptions pharmacy did not received electronically

## 2015-07-23 IMAGING — CT CT ABD-PELV W/ CM
2 of 5 series · 16 of 46 positions shown, 18 images · IV contrast (Omnipaque 300)
Comparison: 09/09/2010

CLINICAL DATA: Nausea, vomiting, diarrhea, weight loss

EXAM:
CT ABDOMEN AND PELVIS WITH CONTRAST
TECHNIQUE: Multidetector CT imaging of the abdomen and pelvis was performed
using the standard protocol following bolus administration of
intravenous contrast.
CONTRAST:  80mL OMNIPAQUE IOHEXOL 300 MG/ML  SOLN

[Series 2: abd/ pel 5mm · axial · 0.67mm/px · z∈[-444,-44]mm · 13 of 90 slices shown, 15 images]
[im 5/90  soft-tissue]
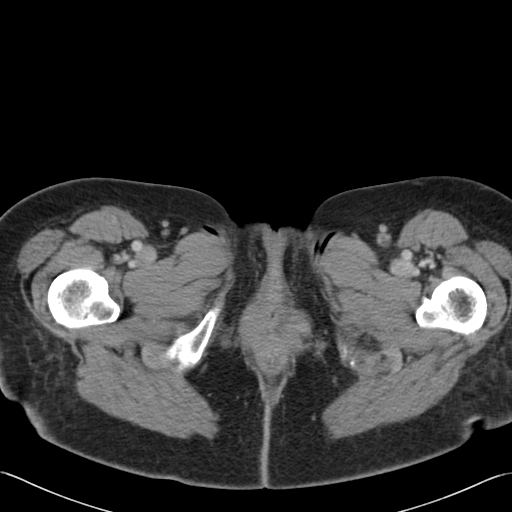
[im 5/90  bone]
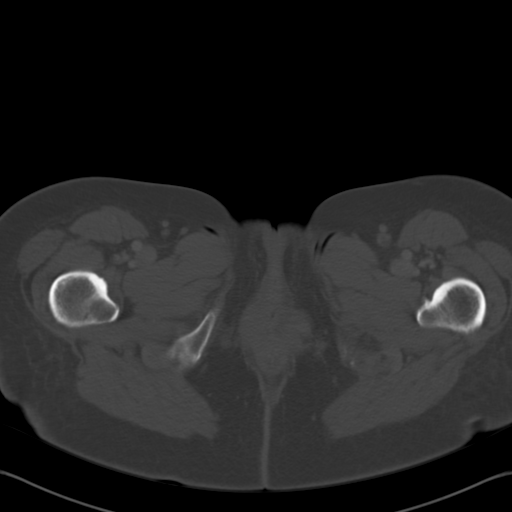
[im 15/90  soft-tissue]
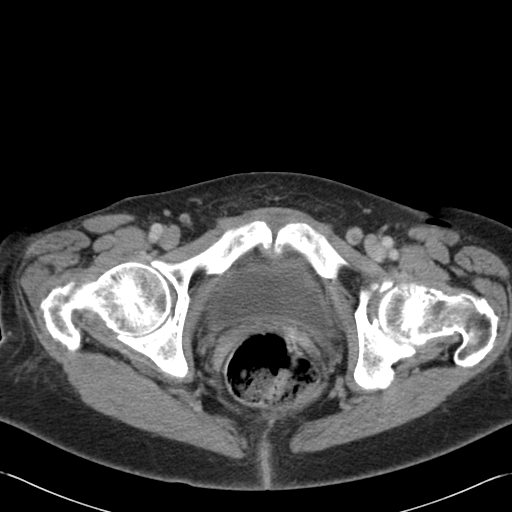
[im 19/90  soft-tissue]
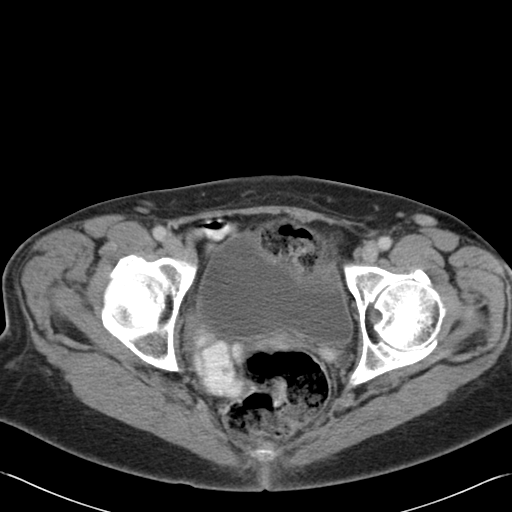
[im 24/90  soft-tissue]
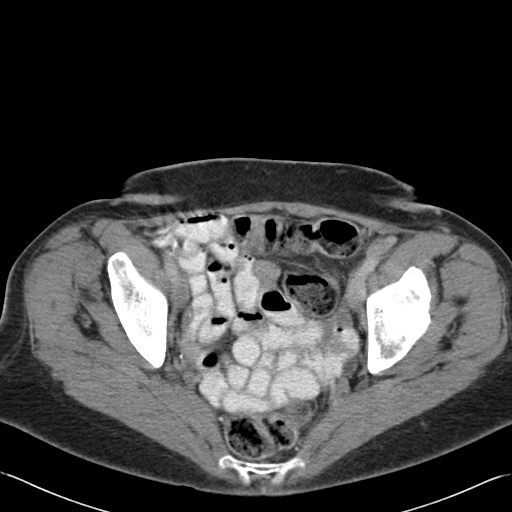
[im 33/90  soft-tissue]
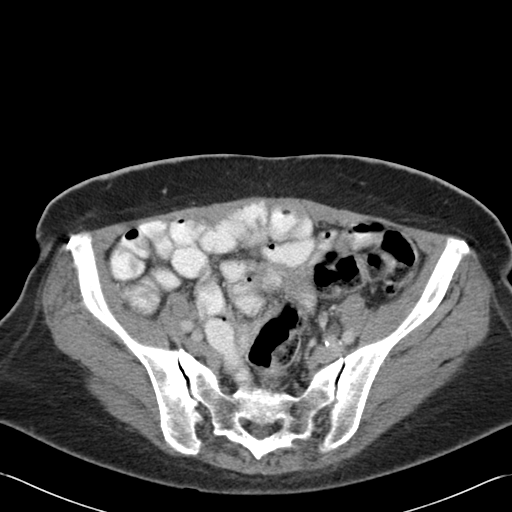
[im 38/90  soft-tissue]
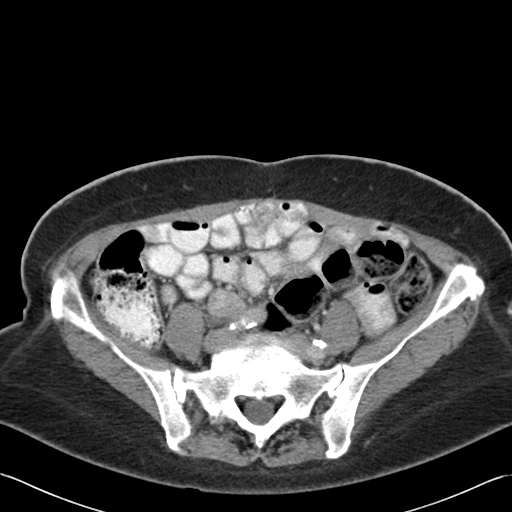
[im 47/90  soft-tissue]
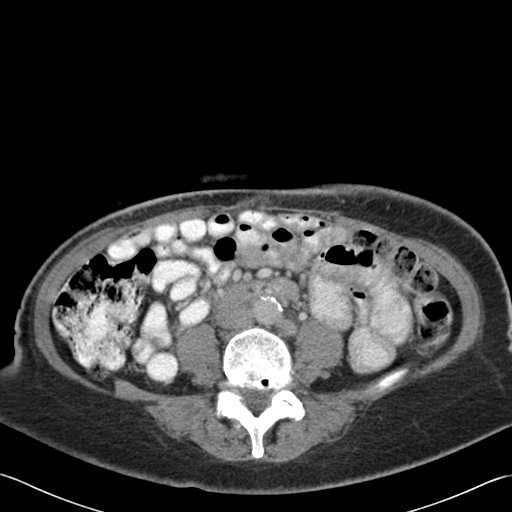
[im 52/90  soft-tissue]
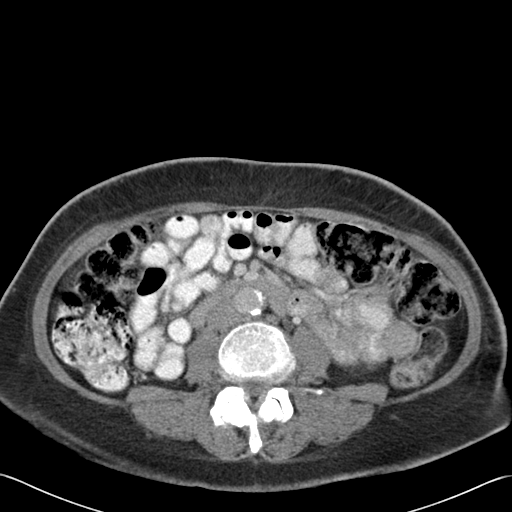
[im 57/90  soft-tissue]
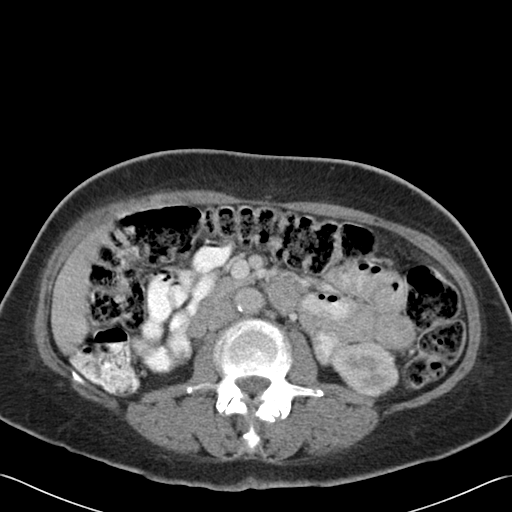
[im 57/90  bone]
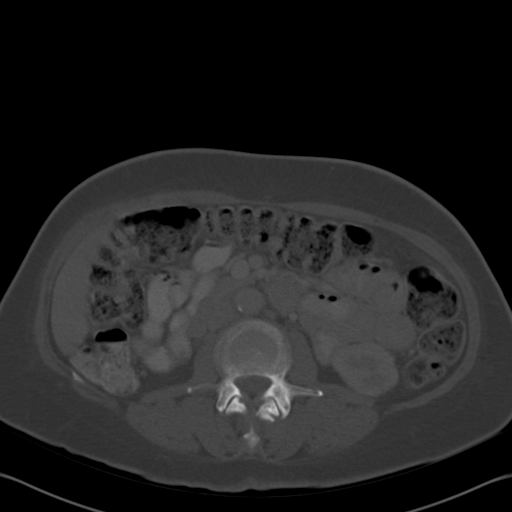
[im 66/90  soft-tissue]
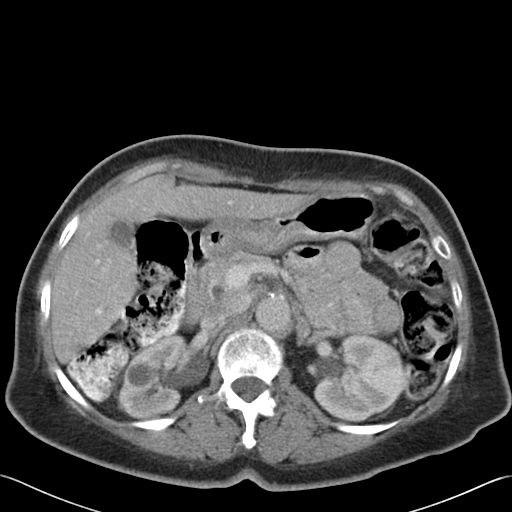
[im 71/90  soft-tissue]
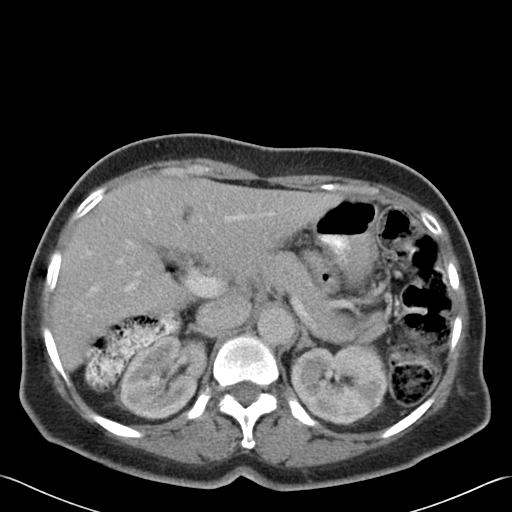
[im 75/90  soft-tissue]
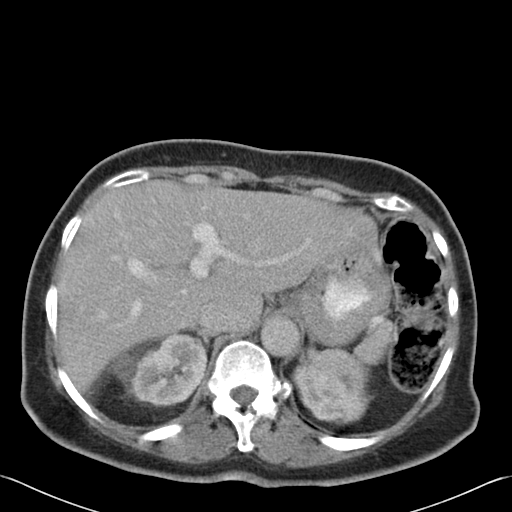
[im 85/90  soft-tissue]
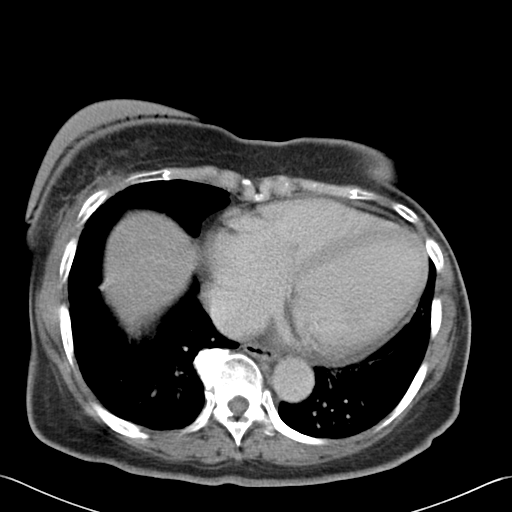

[Series 602: cor · coronal · 0.90mm/px · 3 of 108 slices shown]
[im 36/108  soft-tissue]
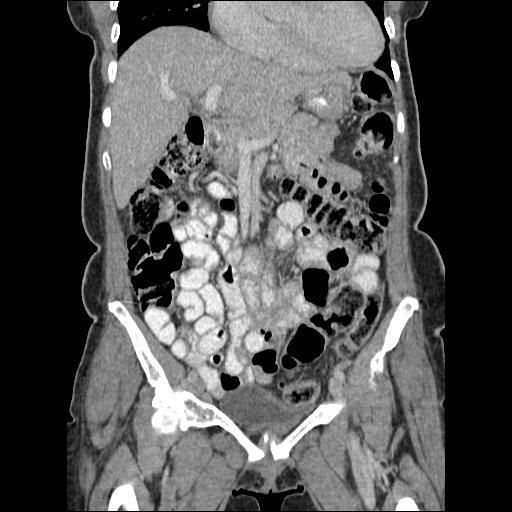
[im 48/108  soft-tissue]
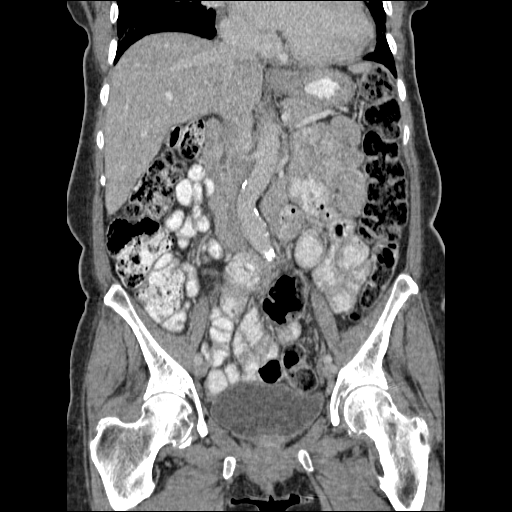
[im 60/108  soft-tissue]
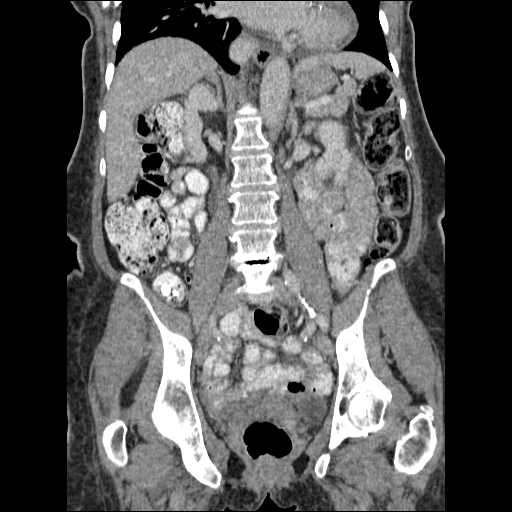

[16 of 46 positions shown; findings below may reference images not displayed]

FINDINGS: Sagittal images of the spine shows significant disc space flattening
with vacuum disc phenomenon at L4-L5 and L5-S1 level.

The lung bases are unremarkable.  Cardiomegaly is noted.

Mild hepatic fatty infiltration. No focal hepatic mass. The
gallbladder is contracted. No calcified gallstones are noted.
Atherosclerotic calcifications of abdominal aorta and iliac
arteries. No aortic aneurysm.

There is mild thickening of proximal gastric wall. Nonspecific
gastritis cannot be excluded. Clinical correlation is necessary.
Kidneys are symmetrical in size and enhancement. No hydronephrosis
or hydroureter. Nonspecific mild right perinephric fluid. There is a
cyst in midpole of the right kidney again noted measures 1.6 cm.
Delayed renal images shows bilateral renal symmetrical excretion.
Mild dilatation of extrarenal left renal pelvis.

There is no small bowel obstruction. Abundant stool noted throughout
the colon. Normal retrocecal appendix. No pericecal inflammation. No
small bowel obstruction. No ascites or free air. No adenopathy.

Moderate stool noted in rectosigmoid colon.

Degenerative changes are noted pubic symphysis. The patient is
status post hysterectomy.
IMPRESSION: 1. Mild hepatic fatty infiltration.
2. Cardiomegaly is noted.
3. Mild thickening of proximal gastric wall. Mild gastritis cannot
be excluded. Clinical correlation is necessary.
4. No hydronephrosis or hydroureter. Nonspecific mild right
perinephric fluid. Again noted a cyst in right kidney midpole
measures 1.6 cm.
5. Abundant colonic stool. No pericecal inflammation. Normal
retrocecal appendix.
6. No small bowel obstruction.
7. Status post hysterectomy.

## 2015-09-27 ENCOUNTER — Emergency Department (HOSPITAL_COMMUNITY)
Admission: EM | Admit: 2015-09-27 | Discharge: 2015-09-27 | Disposition: A | Discharge status: Home / Self Care | Payer: BLUE CROSS/BLUE SHIELD | Source: Home / Self Care | Attending: Family Medicine | Admitting: Family Medicine

## 2015-09-27 ENCOUNTER — Encounter (HOSPITAL_COMMUNITY): Payer: Self-pay | Admitting: *Deleted

## 2015-09-27 ENCOUNTER — Emergency Department (INDEPENDENT_AMBULATORY_CARE_PROVIDER_SITE_OTHER): Payer: BLUE CROSS/BLUE SHIELD

## 2015-09-27 DIAGNOSIS — J069 Acute upper respiratory infection, unspecified: Secondary | ICD-10-CM

## 2015-09-27 MED ORDER — IPRATROPIUM BROMIDE 0.06 % NA SOLN: 2.0000 | Freq: Four times a day (QID) | NASAL | Status: DC

## 2015-09-27 MED ORDER — AZITHROMYCIN 250 MG PO TABS: ORAL_TABLET | ORAL | Status: DC

## 2015-09-27 NOTE — ED Notes (Signed)
Pt  Reports  Symptoms        Of         sorethroat      X  1  Week  With   Congestion   And  cogh       Pt  Is  Not  A    Smoker           Pt  Is sitting  Upright on the  Exam table  Speaking in  Complete  sentances  In no   Acute   Distress

## 2015-09-27 NOTE — ED Provider Notes (Signed)
CSN: [id number]     Arrival date & time 09/27/15  1659 History   First MD Initiated Contact with Patient 09/27/15 1717     Chief Complaint  Patient presents with  . Sore Throat   (Consider location/radiation/quality/duration/timing/severity/associated sxs/prior Treatment) Patient is a 65 y.o. female presenting with pharyngitis. The history is provided by the patient.  Sore Throat This is a new problem. The current episode started more than 1 week ago (cough and sore throat sx for 1 week,). The problem has not changed since onset.Pertinent negatives include no chest pain, no abdominal pain and no shortness of breath.    Past Medical History  Diagnosis Date  . Hypertension   . Arthritis     osteoarthritis  . Diabetes mellitus     TYPE 2  . Breast cancer ([place]) 2008    right, ER/PR -  . Hx of radiation therapy 04/04/07 to 05/20/07    right breast/6260 cGy  . Hypercholesterolemia   . CHF (congestive heart failure) ([place])   . Gastroparesis   . Nonspecific elevation of levels of transaminase or lactic acid dehydrogenase (LDH)   . Regional enteritis of unspecified site   . Restless legs syndrome (RLS)   . Vitamin deficiency   . Depression   . Anxiety   . Malignant neoplasm of breast (female), unspecified site   . Uterine prolapse without mention of vaginal wall prolapse   . Osteoarthrosis, unspecified whether generalized or localized, unspecified site   . Lumbago   . Urinary frequency   . TIA (transient ischemic attack)    Past Surgical History  Procedure Laterality Date  . Breast lumpectomy Right 11/17/06    re-excision 01/13/07  . Arthroplasty      left knee  . Wrist surgery      carpel tunnel  . Total knee arthroplasty Left 06/10/2011       . Abdominal hysterectomy  1997    TAH/BSO, ENDOMETRIAL SARCOMA  . Total knee arthroplasty Right 2006, 2009    Dr Para March  . Total knee arthroplasty Left 2008    Dr Para March  . Radical hysterectomy  1997   Family History  Problem  Relation Age of Onset  . Diabetes Mother   . Diabetes Father   . Aneurysm Sister   . Arthritis Sister   . Diabetes Brother   . Heart Problems Brother   . Diabetes Brother   . Colon cancer Neg Hx    Social History  Substance Use Topics  . Smoking status: Former Smoker -- 0.50 packs/day for 20 years    Quit date: 09/08/1991  . Smokeless tobacco: Never Used  . Alcohol Use: No   OB History    No data available     Review of Systems  Constitutional: Negative.   HENT: Positive for congestion and postnasal drip.   Respiratory: Positive for cough. Negative for shortness of breath and wheezing.   Cardiovascular: Negative.  Negative for chest pain.  Gastrointestinal: Negative for abdominal pain.  Genitourinary: Negative.   All other systems reviewed and are negative.   Allergies  Hydrocodone and Lisinopril  Home Medications   Prior to Admission medications   Medication Sig Start Date End Date Taking? Authorizing Provider  acetaminophen (TYLENOL) 650 MG CR tablet Take 650 mg by mouth every 8 (eight) hours as needed for pain.    Historical Provider, MD  amLODipine (NORVASC) 10 MG tablet Take one tablet by mouth once daily to control blood pressure 05/20/15   Gildardo Cranker,  DO  apixaban (ELIQUIS) 5 MG TABS tablet Take 1 tablet (5 mg total) by mouth 2 (two) times daily. 05/20/15   Deboraha Sprang, MD  atorvastatin (LIPITOR) 40 MG tablet Take one tablet by mouth once daily for cholesterol 12/17/14   Tiffany L Reed, DO  azithromycin (ZITHROMAX Z-PAK) 250 MG tablet Take as directed on pack 09/27/15   Billy Fischer, MD  Blood Glucose Monitoring Suppl (ONE TOUCH TEST STRIP HOLDER) MISC as directed.      Historical Provider, MD  carvedilol (COREG) 25 MG tablet Take 1 tablet (25 mg total) by mouth 2 (two) times daily with a meal. 12/17/14   Tiffany L Reed, DO  cloNIDine (CATAPRES) 0.1 MG tablet Take 1 tablet (0.1 mg total) by mouth daily as needed. Only if blood pressure is greater than 160/100  05/08/14   Blanchie Serve, MD  EPINEPHrine (EPIPEN) 0.3 mg/0.3 mL DEVI Inject 0.3 mLs (0.3 mg total) into the muscle once. 01/25/13   Lauree Chandler, NP  glucose blood (ONE TOUCH TEST STRIPS) test strip Use as instructed- supply enough for twice daily testing. 05/29/11 02/17/13  Zenia Resides, MD  hydrochlorothiazide (HYDRODIURIL) 25 MG tablet TAKE half a TABLET BY MOUTH DAILY FOR BLOOD PRESSURE 03/07/14   Blanchie Serve, MD  ipratropium (ATROVENT) 0.06 % nasal spray Place 2 sprays into both nostrils 4 (four) times daily. 09/27/15   Billy Fischer, MD  losartan (COZAAR) 50 MG tablet TAKE 1 TABLET (50 MG TOTAL) BY MOUTH DAILY. 07/15/15   Gildardo Cranker, DO  losartan (COZAAR) 50 MG tablet Take one tablet by mouth once daily to control blood pressure 07/15/15   Tiffany L Reed, DO  metFORMIN (GLUCOPHAGE) 1000 MG tablet Take 1 tablet (1,000 mg total) by mouth 2 (two) times daily with a meal. 12/17/14   Tiffany L Reed, DO  multivitamin [facility]) per tablet Take 1 tablet by mouth daily. 06/11/11   Cletus Gash, MD  rOPINIRole (REQUIP) 0.25 MG tablet Take one tablet by mouth twice daily for restless leg syndrome 12/17/14   Gayland Curry, DO   Meds Ordered and Administered this Visit  Medications - No data to display  BP 158/68 mmHg  Pulse 74  Temp(Src) 98 F (36.7 C) (Oral)  Resp 16  SpO2 100% No data found.   Physical Exam  Constitutional: She is oriented to person, place, and time. She appears well-developed and well-nourished. No distress.  HENT:  Right Ear: External ear normal.  Left Ear: External ear normal.  Mouth/Throat: Oropharynx is clear and moist.  Eyes: Pupils are equal, round, and reactive to light.  Neck: Normal range of motion. Neck supple.  Cardiovascular: Normal heart sounds and intact distal pulses.   Pulmonary/Chest: Effort normal. She has no decreased breath sounds. She has no wheezes. She has rhonchi. She has no rales.  Lymphadenopathy:    She has no cervical adenopathy.   Neurological: She is alert and oriented to person, place, and time.  Skin: Skin is warm.  Nursing note and vitals reviewed.   ED Course  Procedures (including critical care time)  Labs Review Labs Reviewed - No data to display  Imaging Review Dg Chest 2 View  09/27/2015  CLINICAL DATA:  Productive cough for 2 weeks with sore throat, quit smoking 25 years ago, chills EXAM: CHEST  2 VIEW COMPARISON:  06/02/2011 FINDINGS: Mild cardiac enlargement. Uncoiling and calcification of the aortic arch stable. Vascular pattern normal. No consolidation or infiltrate. No pleural effusion. IMPRESSION: No  active cardiopulmonary disease. Electronically Signed   By: Skipper Cliche M.D.   On: 09/27/2015 17:53   X-rays reviewed and report per radiologist.   Visual Acuity Review  Right Eye Distance:   Left Eye Distance:   Bilateral Distance:    Right Eye Near:   Left Eye Near:    Bilateral Near:         MDM   1. URI (upper respiratory infection)     Meds ordered this encounter  Medications  . azithromycin (ZITHROMAX Z-PAK) 250 MG tablet    Sig: Take as directed on pack    Dispense:  6 tablet    Refill:  0  . ipratropium (ATROVENT) 0.06 % nasal spray    Sig: Place 2 sprays into both nostrils 4 (four) times daily.    Dispense:  15 mL    Refill:  1     Billy Fischer, MD 09/27/15 1850

## 2015-09-27 NOTE — Discharge Instructions (Signed)
Drink plenty of fluids as discussed, use medicine as prescribed, and mucinex or delsym for cough. Return or see your doctor if further problems °

## 2015-10-09 ENCOUNTER — Other Ambulatory Visit: Payer: BLUE CROSS/BLUE SHIELD

## 2015-10-09 ENCOUNTER — Other Ambulatory Visit: Payer: Self-pay | Admitting: *Deleted

## 2015-10-09 DIAGNOSIS — F40240 Claustrophobia: Secondary | ICD-10-CM

## 2015-10-09 NOTE — Addendum Note (Signed)
Addended by: Darliss Ridgel D on: 10/09/2015 09:02 AM   Modules accepted: Orders

## 2015-10-10 LAB — COMPREHENSIVE METABOLIC PANEL
ALT: 21 IU/L (ref 0–32)
AST: 27 IU/L (ref 0–40)
Albumin/Globulin Ratio: 1.6 (ref 1.1–2.5)
Albumin: 4.2 g/dL (ref 3.6–4.8)
Alkaline Phosphatase: 87 IU/L (ref 39–117)
BUN/Creatinine Ratio: 25 (ref 11–26)
BUN: 24 mg/dL (ref 8–27)
Bilirubin Total: 0.4 mg/dL (ref 0.0–1.2)
CALCIUM: 9.4 mg/dL (ref 8.7–10.3)
CO2: 20 mmol/L (ref 18–29)
CREATININE: 0.96 mg/dL (ref 0.57–1.00)
Chloride: 104 mmol/L (ref 96–106)
GFR, EST AFRICAN AMERICAN: 71 mL/min/{1.73_m2} (ref >59)
GFR, EST NON AFRICAN AMERICAN: 62 mL/min/{1.73_m2} (ref >59)
GLUCOSE: 73 mg/dL (ref 65–99)
Globulin, Total: 2.7 g/dL (ref 1.5–4.5)
Potassium: 3.6 mmol/L (ref 3.5–5.2)
Sodium: 140 mmol/L (ref 134–144)
TOTAL PROTEIN: 6.9 g/dL (ref 6.0–8.5)

## 2015-10-10 LAB — LIPID PANEL
CHOL/HDL RATIO: 1.9 ratio (ref 0.0–4.4)
Cholesterol, Total: 188 mg/dL (ref 100–199)
HDL: 101 mg/dL (ref >39)
LDL CALC: 77 mg/dL (ref 0–99)
Triglycerides: 48 mg/dL (ref 0–149)
VLDL CHOLESTEROL CAL: 10 mg/dL (ref 5–40)

## 2015-10-10 LAB — HEMOGLOBIN A1C
Est. average glucose Bld gHb Est-mCnc: 148 mg/dL
HEMOGLOBIN A1C: 6.8 % — AB (ref 4.8–5.6)

## 2015-10-11 ENCOUNTER — Ambulatory Visit (INDEPENDENT_AMBULATORY_CARE_PROVIDER_SITE_OTHER): Payer: BLUE CROSS/BLUE SHIELD | Admitting: Internal Medicine

## 2015-10-11 ENCOUNTER — Encounter: Payer: Self-pay | Admitting: Internal Medicine

## 2015-10-11 VITALS — BP 110/70 | HR 75 | Temp 97.9°F | Resp 18 | Ht 66.0 in | Wt 178.2 lb

## 2015-10-11 DIAGNOSIS — D638 Anemia in other chronic diseases classified elsewhere: Secondary | ICD-10-CM

## 2015-10-11 DIAGNOSIS — G2581 Restless legs syndrome: Secondary | ICD-10-CM | POA: Diagnosis not present

## 2015-10-11 DIAGNOSIS — I10 Essential (primary) hypertension: Secondary | ICD-10-CM | POA: Diagnosis not present

## 2015-10-11 DIAGNOSIS — E785 Hyperlipidemia, unspecified: Secondary | ICD-10-CM

## 2015-10-11 DIAGNOSIS — M1990 Unspecified osteoarthritis, unspecified site: Secondary | ICD-10-CM | POA: Diagnosis not present

## 2015-10-11 DIAGNOSIS — E119 Type 2 diabetes mellitus without complications: Secondary | ICD-10-CM | POA: Diagnosis not present

## 2015-10-11 MED ORDER — TRAMADOL HCL 50 MG PO TABS: 50.0000 mg | ORAL_TABLET | Freq: Two times a day (BID) | ORAL | Status: DC | PRN

## 2015-10-11 NOTE — Patient Instructions (Addendum)
Continue current medications as ordered  Follow up with specialists as scheduled  Will add iron studies to labs already drawn  Follow up in 6 mos for CPE. Fasting labs prior to appt

## 2015-10-11 NOTE — Progress Notes (Signed)
Patient ID: [name], female   DOB: 06-Aug-1951, 65 y.o.   MRN: [id number]    Location:    [facility]   Place of Service:   OFFICE  Chief Complaint  Patient presents with  . Medical Management of Chronic Issues    6 month follow-up for Hypertension, DM  . OTHER    Labs printed    HPI:  65 yo female seen today for f/u. She reports an URI 1/20th and was tx at urgent care with Zpak and atrovent nasal spray. She feels better. She has some arthralgias and saw Ortho. She was rx ultram which helps. She continues to maintain healthy lifestyle and exercises 3 times per week.  She plans to retire in the next 2 yrs.  DM - controlled with A1c 6.8%. Takes metformin  HTN/hx CHF/atrial flutter - BP controlled on amlodipine, clonidine, HCTZ, losartan, core. Takes eliquis for anticoagulation  Hyperlipidemia - LDL 77 on lipitor  Arthritis/LBP - on tramadol  Claustrophobia -stable  CKD - stable. Cr 0.96  Hx breast CA - tx with lumpectomy and XRT  RLS - stable. She stopped requip about 1 month ago   Past Medical History  Diagnosis Date  . Hypertension   . Arthritis     osteoarthritis  . Diabetes mellitus     TYPE 2  . Breast cancer ([place]) 2008    right, ER/PR -  . Hx of radiation therapy 04/04/07 to 05/20/07    right breast/6260 cGy  . Hypercholesterolemia   . CHF (congestive heart failure) ([place])   . Gastroparesis   . Nonspecific elevation of levels of transaminase or lactic acid dehydrogenase (LDH)   . Regional enteritis of unspecified site   . Restless legs syndrome (RLS)   . Vitamin deficiency   . Depression   . Anxiety   . Malignant neoplasm of breast (female), unspecified site   . Uterine prolapse without mention of vaginal wall prolapse   . Osteoarthrosis, unspecified whether generalized or localized, unspecified site   . Lumbago   . Urinary frequency   . TIA (transient ischemic attack)     Past Surgical History  Procedure Laterality Date  . Breast lumpectomy Right  11/17/06    re-excision 01/13/07  . Arthroplasty      left knee  . Wrist surgery      carpel tunnel  . Total knee arthroplasty Left 06/10/2011       . Abdominal hysterectomy  1997    TAH/BSO, ENDOMETRIAL SARCOMA  . Total knee arthroplasty Right 2006, 2009    Dr Para March  . Total knee arthroplasty Left 2008    Dr Para March  . Radical hysterectomy  1997    Patient Care Team: Gildardo Cranker, DO as PCP - General (Internal Medicine)  Social History   Social History  . Marital Status: Widowed    Spouse Name: N/A  . Number of Children: 4  . Years of Education: N/A   Occupational History  . [employer]    Social History Main Topics  . Smoking status: Former Smoker -- 0.50 packs/day for 20 years    Quit date: 09/08/1991  . Smokeless tobacco: Never Used  . Alcohol Use: No  . Drug Use: No  . Sexual Activity: Not on file   Other Topics Concern  . Not on file   Social History Narrative     reports that she quit smoking about 24 years ago. She has never used smokeless tobacco. She reports that she does not  drink alcohol or use illicit drugs.  Allergies  Allergen Reactions  . Hydrocodone Nausea And Vomiting  . Lisinopril     Medications: Patient's Medications  New Prescriptions   No medications on file  Previous Medications   ACETAMINOPHEN (TYLENOL) 650 MG CR TABLET    Take 650 mg by mouth every 8 (eight) hours as needed for pain.   AMLODIPINE (NORVASC) 10 MG TABLET    Take one tablet by mouth once daily to control blood pressure   APIXABAN (ELIQUIS) 5 MG TABS TABLET    Take 1 tablet (5 mg total) by mouth 2 (two) times daily.   ATORVASTATIN (LIPITOR) 40 MG TABLET    Take one tablet by mouth once daily for cholesterol   AZITHROMYCIN (ZITHROMAX Z-PAK) 250 MG TABLET    Take as directed on pack   BLOOD GLUCOSE MONITORING SUPPL (ONE TOUCH TEST STRIP HOLDER) MISC    as directed.     CARVEDILOL (COREG) 25 MG TABLET    Take 1 tablet (25 mg total) by mouth 2 (two) times daily with a  meal.   CLONIDINE (CATAPRES) 0.1 MG TABLET    Take 1 tablet (0.1 mg total) by mouth daily as needed. Only if blood pressure is greater than 160/100   EPINEPHRINE (EPIPEN) 0.3 MG/0.3 ML DEVI    Inject 0.3 mLs (0.3 mg total) into the muscle once.   GLUCOSE BLOOD (ONE TOUCH TEST STRIPS) TEST STRIP    Use as instructed- supply enough for twice daily testing.   HYDROCHLOROTHIAZIDE (HYDRODIURIL) 25 MG TABLET    TAKE half a TABLET BY MOUTH DAILY FOR BLOOD PRESSURE   IPRATROPIUM (ATROVENT) 0.06 % NASAL SPRAY    Place 2 sprays into both nostrils 4 (four) times daily.   LOSARTAN (COZAAR) 50 MG TABLET    TAKE 1 TABLET (50 MG TOTAL) BY MOUTH DAILY.   LOSARTAN (COZAAR) 50 MG TABLET    Take one tablet by mouth once daily to control blood pressure   METFORMIN (GLUCOPHAGE) 1000 MG TABLET    Take 1 tablet (1,000 mg total) by mouth 2 (two) times daily with a meal.   MULTIVITAMIN (THERAGRAN) PER TABLET    Take 1 tablet by mouth daily.   ROPINIROLE (REQUIP) 0.25 MG TABLET    Take one tablet by mouth twice daily for restless leg syndrome   TRAMADOL (ULTRAM) 50 MG TABLET    Take 50 mg by mouth 2 (two) times daily as needed.  Modified Medications   No medications on file  Discontinued Medications   No medications on file    Review of Systems  Constitutional: Negative for fever, chills, diaphoresis, activity change, appetite change and fatigue.  HENT: Negative for ear pain and sore throat.   Eyes: Negative for visual disturbance.  Respiratory: Negative for cough, chest tightness and shortness of breath.   Cardiovascular: Negative for chest pain, palpitations and leg swelling.  Gastrointestinal: Negative for nausea, vomiting, abdominal pain, diarrhea, constipation and blood in stool.  Genitourinary: Negative for dysuria.  Musculoskeletal: Positive for arthralgias.  Neurological: Negative for dizziness, tremors, numbness and headaches.  Psychiatric/Behavioral: Negative for sleep disturbance. The patient is not  nervous/anxious.     Filed Vitals:   10/11/15 0818  BP: 110/70  Pulse: 75  Temp: 97.9 F (36.6 C)  TempSrc: Oral  Resp: 18  Height: 5\' 6"  (1.676 m)  Weight: 178 lb 3.2 oz (80.831 kg)  SpO2: 94%   Body mass index is 28.78 kg/(m^2).  Physical Exam  Constitutional:  She is oriented to person, place, and time. She appears well-developed and well-nourished.  HENT:  Mouth/Throat: Oropharynx is clear and moist. No oropharyngeal exudate.  Eyes: Pupils are equal, round, and reactive to light. No scleral icterus.  Neck: Neck supple. Carotid bruit is not present. No tracheal deviation present. No thyromegaly present.  Cardiovascular: Normal rate, regular rhythm, normal heart sounds and intact distal pulses.  Exam reveals no gallop and no friction rub.   No murmur heard. No LE edema b/l. no calf TTP.   Pulmonary/Chest: Effort normal and breath sounds normal. No stridor. No respiratory distress. She has no wheezes. She has no rales.  Abdominal: Soft. Bowel sounds are normal. She exhibits no distension and no mass. There is no hepatomegaly. There is no tenderness. There is no rebound and no guarding.  Musculoskeletal: She exhibits edema.  Lymphadenopathy:    She has no cervical adenopathy.  Neurological: She is alert and oriented to person, place, and time.  Skin: Skin is warm and dry. No rash noted.  Psychiatric: She has a normal mood and affect. Her behavior is normal. Judgment and thought content normal.     Labs reviewed: Lab on 10/09/2015  Component Date Value Ref Range Status  . Glucose 10/09/2015 73  65 - 99 mg/dL Final  . BUN 10/09/2015 24  8 - 27 mg/dL Final  . Creatinine, Ser 10/09/2015 0.96  0.57 - 1.00 mg/dL Final  . GFR calc non Af Amer 10/09/2015 62  >59 mL/min/1.73 Final  . GFR calc Af Amer 10/09/2015 71  >59 mL/min/1.73 Final  . BUN/Creatinine Ratio 10/09/2015 25  11 - 26 Final  . Sodium 10/09/2015 140  134 - 144 mmol/L Final  . Potassium 10/09/2015 3.6  3.5 - 5.2  mmol/L Final  . Chloride 10/09/2015 104  96 - 106 mmol/L Final  . CO2 10/09/2015 20  18 - 29 mmol/L Final  . Calcium 10/09/2015 9.4  8.7 - 10.3 mg/dL Final  . Total Protein 10/09/2015 6.9  6.0 - 8.5 g/dL Final  . Albumin 10/09/2015 4.2  3.6 - 4.8 g/dL Final  . Globulin, Total 10/09/2015 2.7  1.5 - 4.5 g/dL Final  . Albumin/Globulin Ratio 10/09/2015 1.6  1.1 - 2.5 Final  . Bilirubin Total 10/09/2015 0.4  0.0 - 1.2 mg/dL Final  . Alkaline Phosphatase 10/09/2015 87  39 - 117 IU/L Final  . AST 10/09/2015 27  0 - 40 IU/L Final  . ALT 10/09/2015 21  0 - 32 IU/L Final  . Cholesterol, Total 10/09/2015 188  100 - 199 mg/dL Final  . Triglycerides 10/09/2015 48  0 - 149 mg/dL Final  . HDL 10/09/2015 101  >39 mg/dL Final  . VLDL Cholesterol Cal 10/09/2015 10  5 - 40 mg/dL Final  . LDL Calculated 10/09/2015 77  0 - 99 mg/dL Final  . Chol/HDL Ratio 10/09/2015 1.9  0.0 - 4.4 ratio units Final   Comment:                                   T. Chol/HDL Ratio                                             Men  Women  1/2 Avg.Risk  3.4    3.3                                   Avg.Risk  5.0    4.4                                2X Avg.Risk  9.6    7.1                                3X Avg.Risk 23.4   11.0   . Hgb A1c MFr Bld 10/09/2015 6.8* 4.8 - 5.6 % Final   Comment:          Pre-diabetes: 5.7 - 6.4          Diabetes: >6.4          Glycemic control for adults with diabetes: <7.0   . Est. average glucose Bld gHb Est-m* 10/09/2015 148   Final    Dg Chest 2 View  09/27/2015  CLINICAL DATA:  Productive cough for 2 weeks with sore throat, quit smoking 25 years ago, chills EXAM: CHEST  2 VIEW COMPARISON:  06/02/2011 FINDINGS: Mild cardiac enlargement. Uncoiling and calcification of the aortic arch stable. Vascular pattern normal. No consolidation or infiltrate. No pleural effusion. IMPRESSION: No active cardiopulmonary disease. Electronically Signed   By: Skipper Cliche M.D.   On:  09/27/2015 17:53     Assessment/Plan   ICD-9-CM ICD-10-CM   1. Type 2 diabetes mellitus without complication, without long-term current use of insulin (HCC) 250.00 E11.9   2. HYPERTENSION, BENIGN SYSTEMIC 401.1 I10   3. Osteoarthritis, unspecified osteoarthritis type, unspecified site 715.90 M19.90 traMADol (ULTRAM) 50 MG tablet  4. Restless leg syndrome 333.94 G25.81   5. Anemia, chronic disease 285.29 D63.8   6.      Hyperlipidemia, LDL goal <100  Continue current medications as ordered  Follow up with specialists as scheduled  Will add iron studies to labs already drawn  Follow up in 6 mos for CPE. Fasting labs prior to app  [place] S. Perlie Gold  [facility] [address] [place], [place] [address] [phone] Cell (Monday-Friday 8 AM - 5 PM) [phone] After 5 PM and follow prompts

## 2015-10-12 LAB — IRON AND TIBC
IRON SATURATION: 17 % (ref 15–55)
IRON: 61 ug/dL (ref 27–139)
Total Iron Binding Capacity: 352 ug/dL (ref 250–450)
UIBC: 291 ug/dL (ref 118–369)

## 2015-10-12 LAB — SPECIMEN STATUS REPORT

## 2015-10-12 LAB — FERRITIN: FERRITIN: 31 ng/mL (ref 15–150)

## 2015-10-28 ENCOUNTER — Encounter: Payer: Self-pay | Admitting: Internal Medicine

## 2015-10-28 ENCOUNTER — Other Ambulatory Visit: Payer: Self-pay

## 2015-10-28 ENCOUNTER — Ambulatory Visit (INDEPENDENT_AMBULATORY_CARE_PROVIDER_SITE_OTHER): Payer: BLUE CROSS/BLUE SHIELD | Admitting: Internal Medicine

## 2015-10-28 VITALS — BP 112/64 | HR 67 | Ht 66.0 in | Wt 182.0 lb

## 2015-10-28 DIAGNOSIS — I483 Typical atrial flutter: Secondary | ICD-10-CM

## 2015-10-28 DIAGNOSIS — I429 Cardiomyopathy, unspecified: Secondary | ICD-10-CM | POA: Diagnosis not present

## 2015-10-28 DIAGNOSIS — I480 Paroxysmal atrial fibrillation: Secondary | ICD-10-CM

## 2015-10-28 DIAGNOSIS — Z1231 Encounter for screening mammogram for malignant neoplasm of breast: Secondary | ICD-10-CM

## 2015-10-28 NOTE — Progress Notes (Signed)
Patient Care Team: Gildardo Cranker, DO as PCP - General (Internal Medicine)   HPI  [name] is a 65 y.o. female Seen in followup for a diagnosis of atrial flutter/fibrillation in the context of a cardiomyopathy and a prior TIA.  She also has hypertension  She was started on apixaban.  We reviewed the electrocardiogram that was diagnosed his atrial flutter. It shows sinus rhythm. I have reviewed all ECGs in epic. There is no evidence of atrial arrhythmia.  The patient denies chest pain, shortness of breath, nocturnal dyspnea, orthopnea or peripheral edema.  There have been no palpitations, lightheadedness or syncope.   Catheterization 2001 demonstrated ejection fraction of 40% ; repeat assessment in 2006 by nuclear study had an ejection fraction of 48% By 2009 ejection fraction had normalized by echocardiogram.  Echo 6/14   Echo >> normal EF    She works at night  And loves to crochet  And knit   At 64 she is beginning to think about retiriement   Past Medical History  Diagnosis Date  . Hypertension   . Arthritis     osteoarthritis  . Diabetes mellitus     TYPE 2  . Breast cancer ([place]) 2008    right, ER/PR -  . Hx of radiation therapy 04/04/07 to 05/20/07    right breast/6260 cGy  . Hypercholesterolemia   . CHF (congestive heart failure) ([place])   . Gastroparesis   . Nonspecific elevation of levels of transaminase or lactic acid dehydrogenase (LDH)   . Regional enteritis of unspecified site   . Restless legs syndrome (RLS)   . Vitamin deficiency   . Depression   . Anxiety   . Malignant neoplasm of breast (female), unspecified site   . Uterine prolapse without mention of vaginal wall prolapse   . Osteoarthrosis, unspecified whether generalized or localized, unspecified site   . Lumbago   . Urinary frequency   . TIA (transient ischemic attack)     Past Surgical History  Procedure Laterality Date  . Breast lumpectomy Right 11/17/06    re-excision 01/13/07    . Arthroplasty      left knee  . Wrist surgery      carpel tunnel  . Total knee arthroplasty Left 06/10/2011       . Abdominal hysterectomy  1997    TAH/BSO, ENDOMETRIAL SARCOMA  . Total knee arthroplasty Right 2006, 2009    Dr Para March  . Total knee arthroplasty Left 2008    Dr Para March  . Radical hysterectomy  1997    Current Outpatient Prescriptions  Medication Sig Dispense Refill  . amLODipine (NORVASC) 10 MG tablet Take one tablet by mouth once daily to control blood pressure 90 tablet 3  . apixaban (ELIQUIS) 5 MG TABS tablet Take 1 tablet (5 mg total) by mouth 2 (two) times daily. 90 tablet 3  . atorvastatin (LIPITOR) 40 MG tablet Take one tablet by mouth once daily for cholesterol 90 tablet 3  . Blood Glucose Monitoring Suppl (ONE TOUCH TEST STRIP HOLDER) MISC as directed.      . carvedilol (COREG) 25 MG tablet Take 1 tablet (25 mg total) by mouth 2 (two) times daily with a meal. 180 tablet 3  . cloNIDine (CATAPRES) 0.1 MG tablet Take 1 tablet (0.1 mg total) by mouth daily as needed. Only if blood pressure is greater than 160/100 30 tablet 1  . EPINEPHrine (EPIPEN) 0.3 mg/0.3 mL DEVI Inject 0.3 mLs (0.3 mg total) into  the muscle once. 1 Device 0  . Ferrous Sulfate (IRON) 325 (65 Fe) MG TABS Take by mouth daily.    . hydrochlorothiazide (HYDRODIURIL) 25 MG tablet TAKE half a TABLET BY MOUTH DAILY FOR BLOOD PRESSURE 90 tablet 3  . losartan (COZAAR) 50 MG tablet Take one tablet by mouth once daily to control blood pressure 90 tablet 3  . metFORMIN (GLUCOPHAGE) 1000 MG tablet Take 1 tablet (1,000 mg total) by mouth 2 (two) times daily with a meal. 180 tablet 3  . multivitamin (THERAGRAN) per tablet Take 1 tablet by mouth daily. 30 tablet 11  . traMADol (ULTRAM) 50 MG tablet Take 1 tablet (50 mg total) by mouth 2 (two) times daily as needed. 60 tablet 3  . glucose blood (ONE TOUCH TEST STRIPS) test strip Use as instructed- supply enough for twice daily testing. 100 each 12   No current  facility-administered medications for this visit.    Allergies  Allergen Reactions  . Hydrocodone Nausea And Vomiting  . Lisinopril     Review of Systems negative except from HPI and PMH  Physical Exam BP 112/64 mmHg  Pulse 67  Ht 5\' 6"  (1.676 m)  Wt 182 lb (82.555 kg)  BMI 29.39 kg/m2 Well developed and well nourished in no acute distress HENT normal E scleral and icterus clear Neck Supple JVP flat; carotids brisk and full Clear to ausculation Regular rate and rhythm, no murmurs gallops or rub Soft with active bowel sounds No clubbing cyanosis none Edema Alert and oriented, grossly normal motor and sensory function Skin Warm and Dry  NSR  65 23/09/39  septal mi   Assessment and  Plan  Hypertension  Afib  TIA   Blood pressure is well-controlled.  No intercurrent palpitations.  Tolerating anticoagulation  Continue current medications

## 2015-10-28 NOTE — Patient Instructions (Signed)

## 2015-12-01 ENCOUNTER — Other Ambulatory Visit: Payer: Self-pay | Admitting: Internal Medicine

## 2015-12-13 ENCOUNTER — Ambulatory Visit
Admission: RE | Admit: 2015-12-13 | Discharge: 2015-12-13 | Disposition: A | Discharge status: Home / Self Care | Payer: BLUE CROSS/BLUE SHIELD | Source: Ambulatory Visit

## 2015-12-13 DIAGNOSIS — Z1231 Encounter for screening mammogram for malignant neoplasm of breast: Secondary | ICD-10-CM

## 2015-12-20 ENCOUNTER — Other Ambulatory Visit: Payer: Self-pay | Admitting: Internal Medicine

## 2016-01-13 ENCOUNTER — Telehealth: Payer: Self-pay | Admitting: Internal Medicine

## 2016-01-13 ENCOUNTER — Other Ambulatory Visit: Payer: Self-pay | Admitting: *Deleted

## 2016-01-13 DIAGNOSIS — M1990 Unspecified osteoarthritis, unspecified site: Secondary | ICD-10-CM

## 2016-01-13 MED ORDER — APIXABAN 5 MG PO TABS: 5.0000 mg | ORAL_TABLET | Freq: Two times a day (BID) | ORAL | Status: DC

## 2016-01-13 MED ORDER — TRAMADOL HCL 50 MG PO TABS: 50.0000 mg | ORAL_TABLET | Freq: Two times a day (BID) | ORAL | Status: DC | PRN

## 2016-01-13 MED ORDER — METFORMIN HCL 1000 MG PO TABS: ORAL_TABLET | ORAL | Status: DC

## 2016-01-13 MED ORDER — ATORVASTATIN CALCIUM 40 MG PO TABS: ORAL_TABLET | ORAL | Status: DC

## 2016-01-13 NOTE — Telephone Encounter (Signed)
Pt out of Eliquis, went to pharmacy yesterday and they stated they requested it twice from [place] with no response-she tried to get an emergency supply and they wouldn't give her any-needs 30 days supply call into [facility] and 90 days to mail order

## 2016-01-13 NOTE — Telephone Encounter (Signed)
[facility]

## 2016-01-13 NOTE — Telephone Encounter (Signed)
Patient requested a 30 day supply sent to [facility] and to refill 90 day supply to [facility] for Atrovastatin and Metformin.

## 2016-01-16 ENCOUNTER — Other Ambulatory Visit: Payer: Self-pay | Admitting: *Deleted

## 2016-01-16 MED ORDER — APIXABAN 5 MG PO TABS: 5.0000 mg | ORAL_TABLET | Freq: Two times a day (BID) | ORAL | Status: DC

## 2016-02-10 ENCOUNTER — Other Ambulatory Visit: Payer: Self-pay | Admitting: Internal Medicine

## 2016-03-31 NOTE — Addendum Note (Signed)
Addended by: Moshe Cipro MESHELL A on: 03/31/2016 03:47 PM   Modules accepted: Orders

## 2016-04-06 ENCOUNTER — Other Ambulatory Visit: Payer: BLUE CROSS/BLUE SHIELD

## 2016-04-06 DIAGNOSIS — E785 Hyperlipidemia, unspecified: Secondary | ICD-10-CM

## 2016-04-06 DIAGNOSIS — D638 Anemia in other chronic diseases classified elsewhere: Secondary | ICD-10-CM

## 2016-04-06 DIAGNOSIS — E119 Type 2 diabetes mellitus without complications: Secondary | ICD-10-CM

## 2016-04-06 DIAGNOSIS — I10 Essential (primary) hypertension: Secondary | ICD-10-CM

## 2016-04-06 LAB — CBC WITH DIFFERENTIAL/PLATELET
BASOS PCT: 0 %
Basophils Absolute: 0 cells/uL (ref 0–200)
EOS ABS: 92 {cells}/uL (ref 15–500)
Eosinophils Relative: 2 %
HEMATOCRIT: 33.1 % — AB (ref 35.0–45.0)
Hemoglobin: 11.1 g/dL — ABNORMAL LOW (ref 11.7–15.5)
LYMPHS PCT: 37 %
Lymphs Abs: 1702 cells/uL (ref 850–3900)
MCH: 31.8 pg (ref 27.0–33.0)
MCHC: 33.5 g/dL (ref 32.0–36.0)
MCV: 94.8 fL (ref 80.0–100.0)
MONO ABS: 460 {cells}/uL (ref 200–950)
MPV: 10.2 fL (ref 7.5–12.5)
Monocytes Relative: 10 %
NEUTROS ABS: 2346 {cells}/uL (ref 1500–7800)
Neutrophils Relative %: 51 %
PLATELETS: 250 10*3/uL (ref 140–400)
RBC: 3.49 MIL/uL — AB (ref 3.80–5.10)
RDW: 14.1 % (ref 11.0–15.0)
WBC: 4.6 10*3/uL (ref 3.8–10.8)

## 2016-04-06 LAB — TSH: TSH: 1.35 mIU/L

## 2016-04-07 LAB — COMPREHENSIVE METABOLIC PANEL
ALK PHOS: 84 U/L (ref 33–130)
ALT: 20 U/L (ref 6–29)
AST: 27 U/L (ref 10–35)
Albumin: 4.2 g/dL (ref 3.6–5.1)
BILIRUBIN TOTAL: 0.4 mg/dL (ref 0.2–1.2)
BUN: 28 mg/dL — AB (ref 7–25)
CALCIUM: 9.7 mg/dL (ref 8.6–10.4)
CO2: 23 mmol/L (ref 20–31)
Chloride: 103 mmol/L (ref 98–110)
Creat: 1.29 mg/dL — ABNORMAL HIGH (ref 0.50–0.99)
GLUCOSE: 85 mg/dL (ref 65–99)
Potassium: 4.1 mmol/L (ref 3.5–5.3)
Sodium: 138 mmol/L (ref 135–146)
TOTAL PROTEIN: 7 g/dL (ref 6.1–8.1)

## 2016-04-07 LAB — URINALYSIS, ROUTINE W REFLEX MICROSCOPIC
Bilirubin Urine: NEGATIVE
GLUCOSE, UA: NEGATIVE
Hgb urine dipstick: NEGATIVE
Ketones, ur: NEGATIVE
LEUKOCYTES UA: NEGATIVE
NITRITE: NEGATIVE
PH: 6 (ref 5.0–8.0)
Protein, ur: NEGATIVE
SPECIFIC GRAVITY, URINE: 1.012 (ref 1.001–1.035)

## 2016-04-07 LAB — MICROALBUMIN / CREATININE URINE RATIO
Creatinine, Urine: 64 mg/dL (ref 20–320)
Microalb Creat Ratio: 3 mcg/mg creat (ref <30)
Microalb, Ur: 0.2 mg/dL

## 2016-04-07 LAB — HEMOGLOBIN A1C
HEMOGLOBIN A1C: 6.3 % — AB (ref <5.7)
Mean Plasma Glucose: 134 mg/dL

## 2016-04-07 LAB — LIPID PANEL
CHOL/HDL RATIO: 1.7 ratio (ref <=5.0)
CHOLESTEROL: 190 mg/dL (ref 125–200)
HDL: 112 mg/dL (ref >=46)
LDL Cholesterol: 68 mg/dL (ref <130)
TRIGLYCERIDES: 50 mg/dL (ref <150)
VLDL: 10 mg/dL (ref <30)

## 2016-04-10 ENCOUNTER — Ambulatory Visit (INDEPENDENT_AMBULATORY_CARE_PROVIDER_SITE_OTHER): Payer: BLUE CROSS/BLUE SHIELD | Admitting: Internal Medicine

## 2016-04-10 ENCOUNTER — Encounter: Payer: Self-pay | Admitting: Internal Medicine

## 2016-04-10 VITALS — BP 150/82 | HR 63 | Temp 98.0°F | Resp 12 | Ht 66.5 in | Wt 171.0 lb

## 2016-04-10 DIAGNOSIS — M1990 Unspecified osteoarthritis, unspecified site: Secondary | ICD-10-CM

## 2016-04-10 DIAGNOSIS — D638 Anemia in other chronic diseases classified elsewhere: Secondary | ICD-10-CM

## 2016-04-10 DIAGNOSIS — I10 Essential (primary) hypertension: Secondary | ICD-10-CM

## 2016-04-10 DIAGNOSIS — K4090 Unilateral inguinal hernia, without obstruction or gangrene, not specified as recurrent: Secondary | ICD-10-CM

## 2016-04-10 DIAGNOSIS — Z853 Personal history of malignant neoplasm of breast: Secondary | ICD-10-CM

## 2016-04-10 DIAGNOSIS — Z0000 Encounter for general adult medical examination without abnormal findings: Secondary | ICD-10-CM | POA: Diagnosis not present

## 2016-04-10 DIAGNOSIS — R600 Localized edema: Secondary | ICD-10-CM

## 2016-04-10 DIAGNOSIS — E119 Type 2 diabetes mellitus without complications: Secondary | ICD-10-CM

## 2016-04-10 DIAGNOSIS — R21 Rash and other nonspecific skin eruption: Secondary | ICD-10-CM

## 2016-04-10 DIAGNOSIS — E2839 Other primary ovarian failure: Secondary | ICD-10-CM

## 2016-04-10 DIAGNOSIS — L723 Sebaceous cyst: Secondary | ICD-10-CM

## 2016-04-10 DIAGNOSIS — E785 Hyperlipidemia, unspecified: Secondary | ICD-10-CM

## 2016-04-10 MED ORDER — CLOTRIMAZOLE-BETAMETHASONE 1-0.05 % EX CREA
1.0000 | TOPICAL_CREAM | Freq: Two times a day (BID) | CUTANEOUS | 1 refills | Status: DC
Start: 2016-04-10 — End: 2018-04-29

## 2016-04-10 NOTE — Patient Instructions (Signed)
Encouraged her to exercise 30-45 minutes 4-5 times per week. Eat a well balanced diet. Avoid smoking. Limit alcohol intake. Wear seatbelt when riding in the car. Wear sun block (SPF >50) when spending extended times outside.  Continue current medications as ordered  Will call with bone density and surgery appt  Use cream on rash as needed  Follow up in 6 mos for routine visit

## 2016-04-10 NOTE — Progress Notes (Signed)
Patient ID: [name], female   DOB: [birth year], 65 y.o.   MRN: [id number]   Location:  [facility]  Place of Service:  OFFICE  Provider: Arletha Grippe, DO  Patient Care Team: Gildardo Cranker, DO as PCP - General (Internal Medicine)  Extended Emergency Contact Information Primary Emergency Contact: Emison Address: [address]          [place], [place] [address] [place] Phone: [phone] Relation: Daughter  Code Status: FULL CODE Goals of Care: Advanced Directive information Advanced Directives 04/10/2016  Does patient have an advance directive? No  Would patient like information on creating an advanced directive? Yes - Environmental education officer Complaint  Patient presents with  . Annual Exam    Yearly check-up, discuss labs (copy printed), EKG completed by Cardiologist. DM foot exam due. MMSE 28/30, passed clock drawing   . Orders    BMD   . Medication Management    Discuss HCTZ, patient is not taking but questions if she should     HPI: Patient is a 65 y.o. female seen in today for an annual wellness exam. She reports intermittent abdominal pain over the last several mos. 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 No N/V. No f/c. No change in bowel/bladder habits. She is also c/a light splotches on her nose and left neck area. Her grandchild had similar rash and was rx topical med by her doctor.  No vesicular formation or pain.   She plans to retire in the next 1-2 yrs.  DM - controlled with A1c 6.3%. Takes metformin. No low BS reactions. Urine micro/Cr ratio 3.  HTN/hx CHF/atrial flutter - BP controlled on amlodipine, clonidine, HCTZ, losartan, core. Takes eliquis for anticoagulation. Hgb 11.1. Followed by cardiology Dr Caryl Comes  Hyperlipidemia - LDL 68 on lipitor  Arthritis/LBP - on tramadol  Claustrophobia -stable  CKD - stable. Cr 1.29  Hx breast CA - tx with  lumpectomy and XRT. No recurrence. She is UTD on mammograms  RLS - stable. She stopped requip several mos ago    Depression screen [facility] 2/9 04/10/2016 09/11/2014 03/07/2014 12/21/2012 12/14/2012  Decreased Interest 0 - 0 1 1  Down, Depressed, Hopeless 0 2 0 0 0  PHQ - 2 Score 0 2 0 1 1  Altered sleeping - 1 - - -  Tired, decreased energy - 2 - - -  Change in appetite - 2 - - -  Feeling bad or failure about yourself  - 0 - - -  Trouble concentrating - 0 - - -  Moving slowly or fidgety/restless - 0 - - -  Suicidal thoughts - 0 - - -  PHQ-9 Score - 7 - - -    Fall Risk  04/10/2016 10/11/2015 07/05/2015 04/05/2015 12/28/2014  Falls in the past year? No No No Yes No  Number falls in past yr: - - - 1 -  Injury with Fall? - - - No -   MMSE - Mini Mental State Exam 04/10/2016 04/05/2015  Orientation to time 5 5  Orientation to Place 5 5  Registration 3 3  Attention/ Calculation 5 4  Recall 2 2  Language- name 2 objects 2 2  Language- repeat 1 1  Language- follow 3 step command 3 3  Language- read & follow direction 1 1  Write a sentence 1 1  Copy design 0 1  Total score 28 28  Health Maintenance  Topic Date Due  . Hepatitis C Screening  1951/08/30  . DEXA SCAN  02/11/2014  . OPHTHALMOLOGY EXAM  12/10/2014  . PNA vac Low Risk Adult (2 of 2 - PPSV23) 03/08/2015  . FOOT EXAM  04/04/2016  . INFLUENZA VACCINE  04/07/2016  . HEMOGLOBIN A1C  10/07/2016  . MAMMOGRAM  12/12/2017  . TETANUS/TDAP  11/30/2023  . COLONOSCOPY  01/17/2025  . ZOSTAVAX  Completed    Urinary incontinence? No issues  Functional Status Survey: Is the patient deaf or have difficulty hearing?: No Does the patient have difficulty seeing, even when wearing glasses/contacts?: No Does the patient have difficulty concentrating, remembering, or making decisions?: No Does the patient have difficulty walking or climbing stairs?: No Does the patient have difficulty dressing or bathing?: No Does the patient have difficulty  doing errands alone such as visiting a doctor's office or shopping?: No  Exercise? Routine - 3 times per week to [facility]   Diet? Maintains healthy diet. Has 2 meals per day   Visual Acuity Screening   Right eye Left eye Both eyes  Without correction:     With correction: 20/40 20/40 20/40   Comments: Patient will schedule eye exam within the next 6 months   Hearing: no issues. No tinnitus    Dentition: she goes to the dentist q31mos. She has gum disease  Pain: stable. She has intermittent abdominal pain that is severe on right side. Resolves on its own after several minutes  Past Medical History:  Diagnosis Date  . Anxiety   . Arthritis    osteoarthritis  . Breast cancer ([facility]) 2008   right, ER/PR -  . CHF (congestive heart failure) ([facility])   . Depression   . Diabetes mellitus    TYPE 2  . Gastroparesis   . Hx of radiation therapy 04/04/07 to 05/20/07   right breast/6260 cGy  . Hypercholesterolemia   . Hypertension   . Lumbago   . Malignant neoplasm of breast (female), unspecified site   . Nonspecific elevation of levels of transaminase or lactic acid dehydrogenase (LDH)   . Osteoarthrosis, unspecified whether generalized or localized, unspecified site   . Regional enteritis of unspecified site   . Restless legs syndrome (RLS)   . TIA (transient ischemic attack)   . Urinary frequency   . Uterine prolapse without mention of vaginal wall prolapse   . Vitamin deficiency     Past Surgical History:  Procedure Laterality Date  . ABDOMINAL HYSTERECTOMY  1997   TAH/BSO, ENDOMETRIAL SARCOMA  . ARTHROPLASTY     left knee  . BREAST LUMPECTOMY Right 11/17/06   re-excision 01/13/07  . RADICAL HYSTERECTOMY  1997  . TOTAL KNEE ARTHROPLASTY Left 06/10/2011      . TOTAL KNEE ARTHROPLASTY Right 2006, 2009   Dr Para March  . TOTAL KNEE ARTHROPLASTY Left 2008   Dr Para March  . WRIST SURGERY     carpel tunnel    Family History  Problem Relation Age of Onset  . Diabetes Mother   . Diabetes  Father   . Aneurysm Sister   . Arthritis Sister   . Diabetes Brother   . Heart Problems Brother   . Diabetes Brother   . Colon cancer Neg Hx    Family Status  Relation Status  . Mother Alive  . Father Deceased  . Sister Deceased  . Brother Alive  . Daughter Alive  . Son Alive  . Sister Deceased   Cause of Death: Aneurysm  .  Sister Alive  . Brother Alive  . Brother Alive  . Brother Alive  . Son Alive  . Son Alive  . Brother   . Neg Hx     Social History   Social History  . Marital status: Widowed    Spouse name: N/A  . Number of children: 4  . Years of education: N/A   Occupational History  . [employer]    Social History Main Topics  . Smoking status: Former Smoker    Packs/day: 0.50    Years: 20.00    Quit date: 09/08/1991  . Smokeless tobacco: Never Used  . Alcohol use No  . Drug use: No  . Sexual activity: Not on file   Other Topics Concern  . Not on file   Social History Narrative  . No narrative on file    Allergies  Allergen Reactions  . Hydrocodone Nausea And Vomiting  . Lisinopril       Medication List       Accurate as of 04/10/16 11:59 PM. Always use your most recent med list.          amLODipine 10 MG tablet Commonly known as:  NORVASC Take one tablet by mouth once daily to control blood pressure   apixaban 5 MG Tabs tablet Commonly known as:  ELIQUIS Take 1 tablet (5 mg total) by mouth 2 (two) times daily.   atorvastatin 40 MG tablet Commonly known as:  LIPITOR Take one tablet by mouth once daily for cholesterol   carvedilol 25 MG tablet Commonly known as:  COREG TAKE 1 TABLET TWICE A DAY  WITH MEALS   cloNIDine 0.1 MG tablet Commonly known as:  CATAPRES Take 1 tablet (0.1 mg total) by mouth daily as needed. Only if blood pressure is greater than 160/100   clotrimazole-betamethasone cream Commonly known as:  LOTRISONE Apply 1 application topically 2 (two) times daily.   EPINEPHrine 0.3 mg/0.3 mL Soaj  injection Commonly known as:  EPIPEN Inject 0.3 mLs (0.3 mg total) into the muscle once.   glucose blood test strip Commonly known as:  ONE TOUCH TEST STRIPS Use as instructed- supply enough for twice daily testing.   hydrochlorothiazide 25 MG tablet Commonly known as:  HYDRODIURIL TAKE half a TABLET BY MOUTH DAILY FOR BLOOD PRESSURE   Iron 325 (65 Fe) MG Tabs Take by mouth daily.   losartan 50 MG tablet Commonly known as:  COZAAR Take one tablet by mouth once daily to control blood pressure   metFORMIN 1000 MG tablet Commonly known as:  GLUCOPHAGE Take one tablet by mouth twice daily with meal to control blood sugar   multivitamin per tablet Take 1 tablet by mouth daily.   ONE TOUCH TEST STRIP HOLDER Misc as directed.   traMADol 50 MG tablet Commonly known as:  ULTRAM Take 1 tablet (50 mg total) by mouth 2 (two) times daily as needed.        Review of Systems:  Review of Systems  Cardiovascular: Positive for leg swelling.  Gastrointestinal: Positive for abdominal pain.  Musculoskeletal: Positive for arthralgias.  Skin: Positive for color change and rash.  All other systems reviewed and are negative.   Physical Exam: Vitals:   04/10/16 1437  BP: (!) 150/82  Pulse: 63  Resp: 12  Temp: 98 F (36.7 C)  TempSrc: Oral  SpO2: 98%  Weight: 171 lb (77.6 kg)  Height: 5' 6.5" (1.689 m)   Body mass index is 27.19 kg/m. Physical Exam  Constitutional: She is oriented to person, place, and time. She appears well-developed and well-nourished. No distress.  HENT:  Head: Normocephalic and atraumatic.  Right Ear: Hearing, tympanic membrane, external ear and ear canal normal.  Left Ear: Hearing, tympanic membrane, external ear and ear canal normal.  Mouth/Throat: Uvula is midline, oropharynx is clear and moist and mucous membranes are normal. She does not have dentures.  Eyes: Conjunctivae, EOM and lids are normal. Pupils are equal, round, and reactive to light. No  scleral icterus.  Neck: Trachea normal and normal range of motion. Neck supple. Carotid bruit is not present. No thyroid mass and no thyromegaly present.  Cardiovascular: Normal rate, regular rhythm, normal heart sounds and intact distal pulses.  Exam reveals no gallop and no friction rub.   No murmur heard. No carotid bruit b/l. No LE edema b/l. No calf TTP.   Pulmonary/Chest: Effort normal and breath sounds normal. She has no wheezes. She has no rhonchi. She has no rales. Right breast exhibits skin change. Right breast exhibits no inverted nipple, no mass, no nipple discharge and no tenderness. Left breast exhibits skin change (sebacious cyst, no secondary signs of infection). Left breast exhibits no inverted nipple, no mass, no nipple discharge and no tenderness. Breasts are asymmetrical (surgical scars present).  Abdominal: Soft. Normal appearance, normal aorta and bowel sounds are normal. She exhibits no pulsatile midline mass and no mass. There is no hepatosplenomegaly. There is no tenderness. There is no rigidity, no rebound and no guarding. A hernia is present. Hernia confirmed positive in the right inguinal area (reducible, TTP).  Musculoskeletal: Normal range of motion.  Lymphadenopathy:       Head (right side): No posterior auricular adenopathy present.       Head (left side): No posterior auricular adenopathy present.    She has no cervical adenopathy.       Right: No supraclavicular adenopathy present.       Left: No supraclavicular adenopathy present.  Neurological: She is alert and oriented to person, place, and time. She has normal strength and normal reflexes. No cranial nerve deficit. Gait normal.  Skin: Skin is warm, dry and intact. No rash noted. Nails show no clubbing.  Psychiatric: She has a normal mood and affect. Her speech is normal and behavior is normal. Thought content normal. Cognition and memory are normal.   Diabetic Foot Exam - Simple   Simple Foot Form Diabetic  Foot exam was performed with the following findings:  Yes 04/10/2016  7:25 PM  Visual Inspection No deformities, no ulcerations, no other skin breakdown bilaterally:  Yes Sensation Testing Intact to touch and monofilament testing bilaterally:  Yes Pulse Check Posterior Tibialis and Dorsalis pulse intact bilaterally:  Yes Comments      Labs reviewed:  Basic Metabolic Panel:  Recent Labs  10/09/15 0902 04/06/16 0804  NA 140 138  K 3.6 4.1  CL 104 103  CO2 20 23  GLUCOSE 73 85  BUN 24 28*  CREATININE 0.96 1.29*  CALCIUM 9.4 9.7  TSH  --  1.35   Liver Function Tests:  Recent Labs  10/09/15 0902 04/06/16 0804  AST 27 27  ALT 21 20  ALKPHOS 87 84  BILITOT 0.4 0.4  PROT 6.9 7.0  ALBUMIN 4.2 4.2   No results for input(s): LIPASE, AMYLASE in the last 8760 hours. No results for input(s): AMMONIA in the last 8760 hours. CBC:  Recent Labs  04/06/16 0804  WBC 4.6  NEUTROABS 2,346  HGB  11.1*  HCT 33.1*  MCV 94.8  PLT 250   Lipid Panel:  Recent Labs  10/09/15 0902 04/06/16 0804  CHOL 188 190  HDL 101 112  LDLCALC 77 68  TRIG 48 50  CHOLHDL 1.9 1.7   Lab Results  Component Value Date   HGBA1C 6.3 (H) 04/06/2016    Procedures: No results found.  Assessment/Plan   ICD-9-CM ICD-10-CM   1. Well adult exam V70.0 Z00.00   2. Right inguinal hernia 550.90 K40.90 Ambulatory referral to General Surgery  3. Type 2 diabetes mellitus without complication, without long-term current use of insulin (HCC) 250.00 E11.9   4. Osteoarthritis, unspecified osteoarthritis type, unspecified site 715.90 M19.90   5. HYPERTENSION, BENIGN SYSTEMIC 401.1 I10   6. Hyperlipidemia LDL goal <100 272.4 E78.5   7. Anemia, chronic disease 285.29 D63.8   8. Sebaceous cyst 706.2 L72.3    left breast  9. Bilateral edema of lower extremity 782.3 R60.0   10. History of malignant neoplasm of right breast V10.3 Z85.3   11. Estrogen deficiency 256.39 E28.39 DG Bone Density  12. Rash and  nonspecific skin eruption 782.1 R21 clotrimazole-betamethasone (LOTRISONE) cream    Pt is UTD on health maintenance. Vaccinations are UTD. Pt maintains a healthy lifestyle. Encouraged pt to exercise 30-45 minutes 4-5 times per week. Eat a well balanced diet. Avoid smoking. Limit alcohol intake. Wear seatbelt when riding in the car. Wear sun block (SPF >50) when spending extended times outside.  Continue current medications as ordered  Will call with bone density and surgery appt  Use cream on rash as needed  Follow up in 6 mos for routine visit  Camaya Gannett S. Perlie Gold  [facility] [address] [place], [place] [address] [phone] Cell (Monday-Friday 8 AM - 5 PM) [phone] After 5 PM and follow prompts

## 2016-04-24 ENCOUNTER — Ambulatory Visit
Admission: RE | Admit: 2016-04-24 | Discharge: 2016-04-24 | Disposition: A | Discharge status: Home / Self Care | Payer: BLUE CROSS/BLUE SHIELD | Source: Ambulatory Visit | Attending: Internal Medicine | Admitting: Internal Medicine

## 2016-04-24 DIAGNOSIS — E2839 Other primary ovarian failure: Secondary | ICD-10-CM

## 2016-05-12 ENCOUNTER — Other Ambulatory Visit: Payer: Self-pay | Admitting: *Deleted

## 2016-05-12 MED ORDER — LOSARTAN POTASSIUM 50 MG PO TABS: ORAL_TABLET | ORAL | 3 refills | Status: DC

## 2016-05-12 NOTE — Telephone Encounter (Signed)
[facility]

## 2016-06-21 ENCOUNTER — Other Ambulatory Visit: Payer: Self-pay | Admitting: Internal Medicine

## 2016-08-11 ENCOUNTER — Other Ambulatory Visit: Payer: Self-pay | Admitting: *Deleted

## 2016-08-11 DIAGNOSIS — M1990 Unspecified osteoarthritis, unspecified site: Secondary | ICD-10-CM

## 2016-08-11 MED ORDER — TRAMADOL HCL 50 MG PO TABS: 50.0000 mg | ORAL_TABLET | Freq: Two times a day (BID) | ORAL | 3 refills | Status: DC | PRN

## 2016-08-11 NOTE — Telephone Encounter (Signed)
Patient requested to be faxed to [facility].

## 2016-10-09 LAB — HM DIABETES EYE EXAM

## 2016-10-16 ENCOUNTER — Ambulatory Visit (INDEPENDENT_AMBULATORY_CARE_PROVIDER_SITE_OTHER): Payer: BLUE CROSS/BLUE SHIELD | Admitting: Internal Medicine

## 2016-10-16 ENCOUNTER — Encounter: Payer: Self-pay | Admitting: Internal Medicine

## 2016-10-16 VITALS — BP 178/98 | HR 69 | Temp 97.7°F | Ht 66.0 in | Wt 174.0 lb

## 2016-10-16 DIAGNOSIS — E785 Hyperlipidemia, unspecified: Secondary | ICD-10-CM

## 2016-10-16 DIAGNOSIS — R600 Localized edema: Secondary | ICD-10-CM | POA: Diagnosis not present

## 2016-10-16 DIAGNOSIS — I10 Essential (primary) hypertension: Secondary | ICD-10-CM | POA: Diagnosis not present

## 2016-10-16 DIAGNOSIS — M1990 Unspecified osteoarthritis, unspecified site: Secondary | ICD-10-CM

## 2016-10-16 DIAGNOSIS — E119 Type 2 diabetes mellitus without complications: Secondary | ICD-10-CM | POA: Diagnosis not present

## 2016-10-16 LAB — LIPID PANEL
Cholesterol: 205 mg/dL — ABNORMAL HIGH (ref <200)
HDL: 116 mg/dL (ref >50)
LDL CALC: 80 mg/dL (ref <100)
Total CHOL/HDL Ratio: 1.8 Ratio (ref <5.0)
Triglycerides: 43 mg/dL (ref <150)
VLDL: 9 mg/dL (ref <30)

## 2016-10-16 LAB — BASIC METABOLIC PANEL WITH GFR
BUN: 18 mg/dL (ref 7–25)
CHLORIDE: 106 mmol/L (ref 98–110)
CO2: 24 mmol/L (ref 20–31)
Calcium: 9.3 mg/dL (ref 8.6–10.4)
Creat: 0.95 mg/dL (ref 0.50–0.99)
GFR, Est African American: 72 mL/min (ref >=60)
GFR, Est Non African American: 62 mL/min (ref >=60)
GLUCOSE: 71 mg/dL (ref 65–99)
Potassium: 3.7 mmol/L (ref 3.5–5.3)
Sodium: 142 mmol/L (ref 135–146)

## 2016-10-16 LAB — ALT: ALT: 18 U/L (ref 6–29)

## 2016-10-16 NOTE — Patient Instructions (Signed)
Continue current medications as ordered  Follow up in 6 mos for CPE/ECG  Will call with lab results

## 2016-10-16 NOTE — Progress Notes (Signed)
Patient ID: [name], female   DOB: 15-Dec-1950, 66 y.o.   MRN: [id number]    Location:  [facility] Place of Service: OFFICE  Chief Complaint  Patient presents with  . Medical Management of Chronic Issues    6 months routine visit    HPI:  66 yo female seen today for f/u. She has no concerns. She worked last night and did not get off until 0530. She has not been to sleep yet. Her BP is elevated. No HA or dizziness.   DM - controlled with A1c 6.3%. Takes metformin. Occasional low BS reactions. Urine micro/Cr ratio 3.  HTN/hx CHF/atrial flutter - BP controlled on amlodipine, clonidine, HCTZ, losartan, core. Takes eliquis for anticoagulation. Hgb 11.1. Followed by cardiology Dr Graciela Husbands  Hyperlipidemia - LDL 68 on lipitor  Arthritis/LBP - stable on prn tramadol.  Claustrophobia -stable  CKD - stable. Cr 1.29  Hx breast CA - tx with lumpectomy and XRT. No recurrence. She is UTD on mammograms  RLS - stable. She stopped requip several mos ago   Past Medical History:  Diagnosis Date  . Anxiety   . Arthritis    osteoarthritis  . Breast cancer (HCC) 2008   right, ER/PR -  . CHF (congestive heart failure) (HCC)   . Depression   . Diabetes mellitus    TYPE 2  . Gastroparesis   . Hx of radiation therapy 04/04/07 to 05/20/07   right breast/6260 cGy  . Hypercholesterolemia   . Hypertension   . Lumbago   . Malignant neoplasm of breast (female), unspecified site   . Nonspecific elevation of levels of transaminase or lactic acid dehydrogenase (LDH)   . Osteoarthrosis, unspecified whether generalized or localized, unspecified site   . Regional enteritis of unspecified site   . Restless legs syndrome (RLS)   . TIA (transient ischemic attack)   . Urinary frequency   . Uterine prolapse without mention of vaginal wall prolapse   . Vitamin deficiency     Past Surgical History:  Procedure Laterality Date  . ABDOMINAL HYSTERECTOMY  1997   TAH/BSO, ENDOMETRIAL SARCOMA  . ARTHROPLASTY      left knee  . BREAST LUMPECTOMY Right 11/17/06   re-excision 01/13/07  . RADICAL HYSTERECTOMY  1997  . TOTAL KNEE ARTHROPLASTY Left 06/10/2011      . TOTAL KNEE ARTHROPLASTY Right 2006, 2009   Dr Wyline Mood  . TOTAL KNEE ARTHROPLASTY Left 2008   Dr Wyline Mood  . WRIST SURGERY     carpel tunnel    Patient Care Team: Kirt Boys, DO as PCP - General (Internal Medicine)  Social History   Social History  . Marital status: Widowed    Spouse name: N/A  . Number of children: 4  . Years of education: N/A   Occupational History  . [employer]    Social History Main Topics  . Smoking status: Former Smoker    Packs/day: 0.50    Years: 20.00    Quit date: 09/08/1991  . Smokeless tobacco: Never Used  . Alcohol use No  . Drug use: No  . Sexual activity: Not on file   Other Topics Concern  . Not on file   Social History Narrative  . No narrative on file     reports that she quit smoking about 25 years ago. She has a 10.00 pack-year smoking history. She has never used smokeless tobacco. She reports that she does not drink alcohol or use drugs.  Family History  Problem Relation  Age of Onset  . Diabetes Mother   . Diabetes Father   . Aneurysm Sister   . Arthritis Sister   . Diabetes Brother   . Heart Problems Brother   . Diabetes Brother   . Colon cancer Neg Hx    Family Status  Relation Status  . Mother Alive  . Father Deceased  . Sister Deceased  . Brother Alive  . Daughter Alive  . Son Alive  . Sister Deceased   Cause of Death: Aneurysm  . Sister Alive  . Brother Alive  . Brother Alive  . Brother Alive  . Son Alive  . Son Alive  . Brother   . Neg Hx      Allergies  Allergen Reactions  . Hydrocodone Nausea And Vomiting  . Lisinopril     Medications: Patient's Medications  New Prescriptions   No medications on file  Previous Medications   AMLODIPINE (NORVASC) 10 MG TABLET    TAKE 1 TABLET ONCE DAILY TOCONTROL BLOOD PRESSURE   APIXABAN (ELIQUIS) 5  MG TABS TABLET    Take 1 tablet (5 mg total) by mouth 2 (two) times daily.   ATORVASTATIN (LIPITOR) 40 MG TABLET    Take one tablet by mouth once daily for cholesterol   BLOOD GLUCOSE MONITORING SUPPL (ONE TOUCH TEST STRIP HOLDER) MISC    as directed.     CARVEDILOL (COREG) 25 MG TABLET    TAKE 1 TABLET TWICE A DAY  WITH MEALS   CLONIDINE (CATAPRES) 0.1 MG TABLET    Take 1 tablet (0.1 mg total) by mouth daily as needed. Only if blood pressure is greater than 160/100   CLOTRIMAZOLE-BETAMETHASONE (LOTRISONE) CREAM    Apply 1 application topically 2 (two) times daily.   EPINEPHRINE (EPIPEN) 0.3 MG/0.3 ML DEVI    Inject 0.3 mLs (0.3 mg total) into the muscle once.   FERROUS SULFATE (IRON) 325 (65 FE) MG TABS    Take by mouth daily.   GLUCOSE BLOOD (ONE TOUCH TEST STRIPS) TEST STRIP    Use as instructed- supply enough for twice daily testing.   LOSARTAN (COZAAR) 50 MG TABLET    Take one tablet by mouth once daily to control blood pressure   METFORMIN (GLUCOPHAGE) 1000 MG TABLET    Take one tablet by mouth twice daily with meal to control blood sugar   MULTIVITAMIN (THERAGRAN) PER TABLET    Take 1 tablet by mouth daily.   TRAMADOL (ULTRAM) 50 MG TABLET    Take 1 tablet (50 mg total) by mouth 2 (two) times daily as needed.  Modified Medications   No medications on file  Discontinued Medications   HYDROCHLOROTHIAZIDE (HYDRODIURIL) 25 MG TABLET    TAKE half a TABLET BY MOUTH DAILY FOR BLOOD PRESSURE    Review of Systems  Musculoskeletal: Positive for arthralgias and joint swelling.  All other systems reviewed and are negative.   Vitals:   10/16/16 0828  BP: (!) 178/98  Pulse: 69  Temp: 97.7 F (36.5 C)  TempSrc: Oral  SpO2: 99%  Weight: 174 lb (78.9 kg)  Height: '5\' 6"'$  (1.676 m)   Body mass index is 28.08 kg/m.  Physical Exam  Constitutional: She is oriented to person, place, and time. She appears well-developed and well-nourished.  HENT:  Mouth/Throat: Oropharynx is clear and moist.  No oropharyngeal exudate.  Eyes: Pupils are equal, round, and reactive to light. No scleral icterus.  Neck: Neck supple. Carotid bruit is not present. No tracheal deviation present.  No thyromegaly present.  Cardiovascular: Normal rate, regular rhythm, normal heart sounds and intact distal pulses.  Exam reveals no gallop and no friction rub.   No murmur heard. Trace LE edema b/l. no calf TTP.   Pulmonary/Chest: Effort normal and breath sounds normal. No stridor. No respiratory distress. She has no wheezes. She has no rales.  Abdominal: Soft. Bowel sounds are normal. She exhibits no distension and no mass. There is no hepatomegaly. There is no tenderness. There is no rebound and no guarding.  Musculoskeletal: She exhibits edema.  Lymphadenopathy:    She has no cervical adenopathy.  Neurological: She is alert and oriented to person, place, and time.  Skin: Skin is warm and dry. No rash noted.  Psychiatric: She has a normal mood and affect. Her behavior is normal. Judgment and thought content normal.     Labs reviewed: No visits with results within 3 Month(s) from this visit.  Latest known visit with results is:  Appointment on 04/06/2016  Component Date Value Ref Range Status  . WBC 04/06/2016 4.6  3.8 - 10.8 K/uL Final  . RBC 04/06/2016 3.49* 3.80 - 5.10 MIL/uL Final  . Hemoglobin 04/06/2016 11.1* 11.7 - 15.5 g/dL Final  . HCT 04/06/2016 33.1* 35.0 - 45.0 % Final  . MCV 04/06/2016 94.8  80.0 - 100.0 fL Final  . MCH 04/06/2016 31.8  27.0 - 33.0 pg Final  . MCHC 04/06/2016 33.5  32.0 - 36.0 g/dL Final  . RDW 04/06/2016 14.1  11.0 - 15.0 % Final  . Platelets 04/06/2016 250  140 - 400 K/uL Final  . MPV 04/06/2016 10.2  7.5 - 12.5 fL Final  . Neutro Abs 04/06/2016 2346  1,500 - 7,800 cells/uL Final  . Lymphs Abs 04/06/2016 1702  850 - 3,900 cells/uL Final  . Monocytes Absolute 04/06/2016 460  200 - 950 cells/uL Final  . Eosinophils Absolute 04/06/2016 92  15 - 500 cells/uL Final  .  Basophils Absolute 04/06/2016 0  0 - 200 cells/uL Final  . Neutrophils Relative % 04/06/2016 51  % Final  . Lymphocytes Relative 04/06/2016 37  % Final  . Monocytes Relative 04/06/2016 10  % Final  . Eosinophils Relative 04/06/2016 2  % Final  . Basophils Relative 04/06/2016 0  % Final  . Smear Review 04/06/2016 Criteria for review not met   Final  . Sodium 04/06/2016 138  135 - 146 mmol/L Final  . Potassium 04/06/2016 4.1  3.5 - 5.3 mmol/L Final  . Chloride 04/06/2016 103  98 - 110 mmol/L Final  . CO2 04/06/2016 23  20 - 31 mmol/L Final  . Glucose, Bld 04/06/2016 85  65 - 99 mg/dL Final  . BUN 04/06/2016 28* 7 - 25 mg/dL Final  . Creat 04/06/2016 1.29* 0.50 - 0.99 mg/dL Final   Comment:   For patients > or = 68 years of age: The upper reference limit for Creatinine is approximately 13% higher for people identified as African-American.     . Total Bilirubin 04/06/2016 0.4  0.2 - 1.2 mg/dL Final  . Alkaline Phosphatase 04/06/2016 84  33 - 130 U/L Final  . AST 04/06/2016 27  10 - 35 U/L Final  . ALT 04/06/2016 20  6 - 29 U/L Final  . Total Protein 04/06/2016 7.0  6.1 - 8.1 g/dL Final  . Albumin 04/06/2016 4.2  3.6 - 5.1 g/dL Final  . Calcium 04/06/2016 9.7  8.6 - 10.4 mg/dL Final  . Hgb A1c MFr Bld 04/06/2016 6.3* <  5.7 % Final   Comment:   For someone without known diabetes, a hemoglobin A1c value between 5.7% and 6.4% is consistent with prediabetes and should be confirmed with a follow-up test.   For someone with known diabetes, a value <7% indicates that their diabetes is well controlled. A1c targets should be individualized based on duration of diabetes, age, co-morbid conditions and other considerations.   This assay result is consistent with an increased risk of diabetes.   Currently, no consensus exists regarding use of hemoglobin A1c for diagnosis of diabetes in children.     . Mean Plasma Glucose 04/06/2016 134  mg/dL Final  . Creatinine, Urine 04/06/2016 64  20 -  320 mg/dL Final  . Microalb, Ur 04/06/2016 0.2  Not estab mg/dL Final  . Microalb Creat Ratio 04/06/2016 3  <30 mcg/mg creat Final   Comment: The ADA has defined abnormalities in albumin excretion as follows:           Category           Result                            (mcg/mg creatinine)                 Normal:    <30       Microalbuminuria:    30 - 299   Clinical albuminuria:    > or = 300   The ADA recommends that at least two of three specimens collected within a 3 - 6 month period be abnormal before considering a patient to be within a diagnostic category.     . Color, Urine 04/06/2016 YELLOW  YELLOW Final   Comment: ** Please note change in unit of measure and reference range(s). **     . APPearance 04/06/2016 CLEAR  CLEAR Final  . Specific Gravity, Urine 04/06/2016 1.012  1.001 - 1.035 Final  . pH 04/06/2016 6.0  5.0 - 8.0 Final  . Glucose, UA 04/06/2016 NEGATIVE  NEGATIVE Final  . Bilirubin Urine 04/06/2016 NEGATIVE  NEGATIVE Final  . Ketones, ur 04/06/2016 NEGATIVE  NEGATIVE Final  . Hgb urine dipstick 04/06/2016 NEGATIVE  NEGATIVE Final  . Protein, ur 04/06/2016 NEGATIVE  NEGATIVE Final  . Nitrite 04/06/2016 NEGATIVE  NEGATIVE Final  . Leukocytes, UA 04/06/2016 NEGATIVE  NEGATIVE Final  . TSH 04/06/2016 1.35  mIU/L Final   Comment:   Reference Range   > or = 20 Years  0.40-4.50   Pregnancy Range First trimester  0.26-2.66 Second trimester 0.55-2.73 Third trimester  0.43-2.91     . Cholesterol 04/06/2016 190  125 - 200 mg/dL Final  . Triglycerides 04/06/2016 50  <150 mg/dL Final  . HDL 04/06/2016 112  >=46 mg/dL Final  . Total CHOL/HDL Ratio 04/06/2016 1.7  <=5.0 Ratio Final  . VLDL 04/06/2016 10  <30 mg/dL Final  . LDL Cholesterol 04/06/2016 68  <130 mg/dL Final   Comment:   Total Cholesterol/HDL Ratio:CHD Risk                        Coronary Heart Disease Risk Table                                        Men       Women  1/2 Average Risk               3.4        3.3              Average Risk              5.0        4.4           2X Average Risk              9.6        7.1           3X Average Risk             23.4       11.0 Use the calculated Patient Ratio above and the CHD Risk table  to determine the patient's CHD Risk.     No results found.   Assessment/Plan   ICD-9-CM ICD-10-CM   1. HYPERTENSION, BENIGN SYSTEMIC 401.1 I10   2. Type 2 diabetes mellitus without complication, without long-term current use of insulin (HCC) 250.00 E11.9 BMP with eGFR     ALT     Hemoglobin A1c  3. Hyperlipidemia LDL goal <100 272.4 E78.5 Lipid Panel  4. Bilateral edema of lower extremity 782.3 R60.0   5. Osteoarthritis, unspecified osteoarthritis type, unspecified site 715.90 Y28.24    Handicap license plate form completed  F/u with cardio as scheduled  Continue current medications as ordered  Follow up in 6 mos for CPE/ECG  Will call with lab results  [facility] S. Perlie Gold  [facility] [address] [place],  [address] [phone] Cell (Monday-Friday 8 AM - 5 PM) [phone] After 5 PM and follow prompts

## 2016-10-17 LAB — HEMOGLOBIN A1C
Hgb A1c MFr Bld: 6 % — ABNORMAL HIGH (ref <5.7)
Mean Plasma Glucose: 126 mg/dL

## 2016-11-02 ENCOUNTER — Other Ambulatory Visit: Payer: Self-pay | Admitting: Internal Medicine

## 2016-11-02 DIAGNOSIS — Z1231 Encounter for screening mammogram for malignant neoplasm of breast: Secondary | ICD-10-CM

## 2016-11-02 DIAGNOSIS — Z853 Personal history of malignant neoplasm of breast: Secondary | ICD-10-CM

## 2016-12-06 ENCOUNTER — Other Ambulatory Visit: Payer: Self-pay | Admitting: Internal Medicine

## 2016-12-06 DIAGNOSIS — E119 Type 2 diabetes mellitus without complications: Secondary | ICD-10-CM

## 2016-12-07 ENCOUNTER — Encounter: Payer: Self-pay | Admitting: Internal Medicine

## 2016-12-18 ENCOUNTER — Ambulatory Visit
Admission: RE | Admit: 2016-12-18 | Discharge: 2016-12-18 | Disposition: A | Discharge status: Home / Self Care | Payer: BLUE CROSS/BLUE SHIELD | Source: Ambulatory Visit | Attending: Internal Medicine | Admitting: Internal Medicine

## 2016-12-18 DIAGNOSIS — Z853 Personal history of malignant neoplasm of breast: Secondary | ICD-10-CM

## 2016-12-18 DIAGNOSIS — Z1231 Encounter for screening mammogram for malignant neoplasm of breast: Secondary | ICD-10-CM

## 2016-12-21 ENCOUNTER — Encounter: Payer: Self-pay | Admitting: Internal Medicine

## 2016-12-21 ENCOUNTER — Ambulatory Visit (INDEPENDENT_AMBULATORY_CARE_PROVIDER_SITE_OTHER): Payer: BLUE CROSS/BLUE SHIELD | Admitting: Internal Medicine

## 2016-12-21 VITALS — BP 130/78 | HR 81 | Ht 66.0 in | Wt 168.0 lb

## 2016-12-21 DIAGNOSIS — I480 Paroxysmal atrial fibrillation: Secondary | ICD-10-CM | POA: Diagnosis not present

## 2016-12-21 DIAGNOSIS — I429 Cardiomyopathy, unspecified: Secondary | ICD-10-CM

## 2016-12-21 DIAGNOSIS — I483 Typical atrial flutter: Secondary | ICD-10-CM

## 2016-12-21 LAB — CBC WITH DIFFERENTIAL/PLATELET
BASOS ABS: 0 10*3/uL (ref 0.0–0.2)
Basos: 0 %
EOS (ABSOLUTE): 0.1 10*3/uL (ref 0.0–0.4)
Eos: 2 %
Hematocrit: 32.9 % — ABNORMAL LOW (ref 34.0–46.6)
Hemoglobin: 11.2 g/dL (ref 11.1–15.9)
IMMATURE GRANS (ABS): 0 10*3/uL (ref 0.0–0.1)
IMMATURE GRANULOCYTES: 0 %
LYMPHS: 26 %
Lymphocytes Absolute: 1.8 10*3/uL (ref 0.7–3.1)
MCH: 31.7 pg (ref 26.6–33.0)
MCHC: 34 g/dL (ref 31.5–35.7)
MCV: 93 fL (ref 79–97)
MONOS ABS: 0.7 10*3/uL (ref 0.1–0.9)
Monocytes: 10 %
NEUTROS ABS: 4.3 10*3/uL (ref 1.4–7.0)
NEUTROS PCT: 62 %
PLATELETS: 243 10*3/uL (ref 150–379)
RBC: 3.53 x10E6/uL — ABNORMAL LOW (ref 3.77–5.28)
RDW: 14.1 % (ref 12.3–15.4)
WBC: 6.9 10*3/uL (ref 3.4–10.8)

## 2016-12-21 NOTE — Progress Notes (Signed)
He     Patient Care Team: Gildardo Cranker, DO as PCP - General (Internal Medicine)   HPI  [name] is a 66 y.o. female Seen in followup for a diagnosis of atrial flutter/fibrillation in the context of a cardiomyopathy and a prior TIA.  She also has hypertension  She was started on apixaban.  We reviewed the electrocardiogram that was diagnosed his atrial flutter. It shows sinus rhythm. I have reviewed all ECGs in epic. There is no evidence of atrial arrhythmia.  The patient denies chest pain, shortness of breath, nocturnal dyspnea, orthopnea or peripheral edema.  There have been no palpitations, lightheadedness or syncope.   Catheterization 2001 demonstrated ejection fraction of 40% ; repeat assessment in 2006 by nuclear study had an ejection fraction of 48% By 2009 ejection fraction had normalized by echocardiogram.  Echo 6/14   Echo >> normal EF    She works at night  And loves to crochet  And knit   At 64 she is beginning to think about retiriement   Past Medical History:  Diagnosis Date  . Anxiety   . Arthritis    osteoarthritis  . Breast cancer ([place]) 2008   right, ER/PR -  . CHF (congestive heart failure) ([place])   . Depression   . Diabetes mellitus    TYPE 2  . Gastroparesis   . Hx of radiation therapy 04/04/07 to 05/20/07   right breast/6260 cGy  . Hypercholesterolemia   . Hypertension   . Lumbago   . Malignant neoplasm of breast (female), unspecified site   . Nonspecific elevation of levels of transaminase or lactic acid dehydrogenase (LDH)   . Osteoarthrosis, unspecified whether generalized or localized, unspecified site   . Regional enteritis of unspecified site   . Restless legs syndrome (RLS)   . TIA (transient ischemic attack)   . Urinary frequency   . Uterine prolapse without mention of vaginal wall prolapse   . Vitamin deficiency     Past Surgical History:  Procedure Laterality Date  . ABDOMINAL HYSTERECTOMY  1997   TAH/BSO, ENDOMETRIAL  SARCOMA  . ARTHROPLASTY     left knee  . BREAST LUMPECTOMY Right 11/17/06   re-excision 01/13/07  . RADICAL HYSTERECTOMY  1997  . TOTAL KNEE ARTHROPLASTY Left 06/10/2011      . TOTAL KNEE ARTHROPLASTY Right 2006, 2009   Dr Para March  . TOTAL KNEE ARTHROPLASTY Left 2008   Dr Para March  . WRIST SURGERY     carpel tunnel    Current Outpatient Prescriptions  Medication Sig Dispense Refill  . amLODipine (NORVASC) 10 MG tablet TAKE 1 TABLET ONCE DAILY TOCONTROL BLOOD PRESSURE 90 tablet 3  . apixaban (ELIQUIS) 5 MG TABS tablet Take 1 tablet (5 mg total) by mouth 2 (two) times daily. 180 tablet 2  . atorvastatin (LIPITOR) 40 MG tablet Take one tablet by mouth once daily for cholesterol 90 tablet 3  . Blood Glucose Monitoring Suppl (ONE TOUCH TEST STRIP HOLDER) MISC as directed.      . carvedilol (COREG) 25 MG tablet TAKE 1 TABLET TWICE DAILY  WITH MEALS 180 tablet 3  . cloNIDine (CATAPRES) 0.1 MG tablet Take 1 tablet (0.1 mg total) by mouth daily as needed. Only if blood pressure is greater than 160/100 30 tablet 1  . clotrimazole-betamethasone (LOTRISONE) cream Apply 1 application topically 2 (two) times daily. 30 g 1  . EPINEPHrine (EPIPEN) 0.3 mg/0.3 mL DEVI Inject 0.3 mLs (0.3 mg total) into the muscle once. 1 Device  0  . Ferrous Sulfate (IRON) 325 (65 Fe) MG TABS Take by mouth daily.    [name] Kitchen losartan (COZAAR) 50 MG tablet Take one tablet by mouth once daily to control blood pressure 90 tablet 3  . metFORMIN (GLUCOPHAGE) 1000 MG tablet Take one tablet by mouth twice daily with meal to control blood sugar 180 tablet 3  . metFORMIN (GLUCOPHAGE) 1000 MG tablet TAKE 1 TABLET TWICE A DAY  WITH MEALS 180 tablet 2  . multivitamin (THERAGRAN) per tablet Take 1 tablet by mouth daily. 30 tablet 11  . traMADol (ULTRAM) 50 MG tablet Take 1 tablet (50 mg total) by mouth 2 (two) times daily as needed. 60 tablet 3  . glucose blood (ONE TOUCH TEST STRIPS) test strip Use as instructed- supply enough for twice daily  testing. 100 each 12   No current facility-administered medications for this visit.     Allergies  Allergen Reactions  . Hydrocodone Nausea And Vomiting  . Lisinopril     Review of Systems negative except from HPI and PMH  Physical Exam BP 130/78   Pulse 81   Ht 5\' 6"  (1.676 m)   Wt 168 lb (76.2 kg)   SpO2 98%   BMI 27.12 kg/m  Well developed and well nourished in no acute distress HENT normal E scleral and icterus clear Neck Supple JVP flat; carotids brisk and full Clear to ausculation Regular rate and rhythm, no murmurs gallops or rub Soft with active bowel sounds No clubbing cyanosis none Edema Alert and oriented, grossly normal motor and sensory function Skin Warm and Dry  NSR  67 23/09/39 Septal mi   Assessment and  Plan  Hypertension  Afib by history although not confirmed   TIA  Fatigue //Dizziness acute   Blood pressure well controlled   Will check CBC on apixoban   Fatigue is curious. The abruptness once in the wake of the abrupt dizziness makes me wonder whether it is related to a possible interval stroke. She is starting to feel a little bit better. Her dizziness had a positional component to it suggestive of orthostasis but it could also have been related to changes in position of her head i.e. vertiginous and neurological. This is fatigue does not abate, we discussed undertaking an MRI to look for interval stroke.

## 2016-12-21 NOTE — Patient Instructions (Signed)
Medication Instructions: - Your physician recommends that you continue on your current medications as directed. Please refer to the Current Medication list given to you today.  Labwork: - Your physician recommends that you have lab work today: CBC   Procedures/Testing: - none ordered  Follow-Up: - Your physician wants you to follow-up in: 1 year with Dr. Klein.You will receive a reminder letter in the mail two months in advance. If you don't receive a letter, please call our office to schedule the follow-up appointment.   Any Additional Special Instructions Will Be Listed Below (If Applicable).     If you need a refill on your cardiac medications before your next appointment, please call your pharmacy.   

## 2016-12-25 ENCOUNTER — Ambulatory Visit: Payer: BLUE CROSS/BLUE SHIELD | Admitting: Internal Medicine

## 2016-12-29 ENCOUNTER — Other Ambulatory Visit: Payer: Self-pay | Admitting: *Deleted

## 2016-12-29 DIAGNOSIS — M1990 Unspecified osteoarthritis, unspecified site: Secondary | ICD-10-CM

## 2016-12-29 MED ORDER — TRAMADOL HCL 50 MG PO TABS: 50.0000 mg | ORAL_TABLET | Freq: Two times a day (BID) | ORAL | 3 refills | Status: DC | PRN

## 2016-12-29 NOTE — Telephone Encounter (Signed)
Patient requested Rx to be sent to [facility] 

## 2017-01-11 ENCOUNTER — Other Ambulatory Visit: Payer: Self-pay | Admitting: Internal Medicine

## 2017-01-25 ENCOUNTER — Other Ambulatory Visit: Payer: Self-pay | Admitting: Internal Medicine

## 2017-01-25 ENCOUNTER — Other Ambulatory Visit: Payer: Self-pay | Admitting: *Deleted

## 2017-01-25 NOTE — Telephone Encounter (Signed)
Age 66 years 12/21/2016 Saw Dr Pollyann Glen 76.2kg 12/23/2016 12/21/2016 Hgb 11.2 HCT 32.9  10/16/2016  SrCr 0.95  Refill done for Eliquis  5mg  q 12 hours as requested

## 2017-01-25 NOTE — Telephone Encounter (Signed)
Wt 76.2 kg   12/21/2016

## 2017-04-09 ENCOUNTER — Ambulatory Visit (INDEPENDENT_AMBULATORY_CARE_PROVIDER_SITE_OTHER): Payer: BLUE CROSS/BLUE SHIELD | Admitting: Internal Medicine

## 2017-04-09 ENCOUNTER — Encounter: Payer: Self-pay | Admitting: Internal Medicine

## 2017-04-09 VITALS — BP 138/86 | HR 70 | Temp 97.6°F | Ht 66.0 in | Wt 173.0 lb

## 2017-04-09 DIAGNOSIS — D638 Anemia in other chronic diseases classified elsewhere: Secondary | ICD-10-CM | POA: Diagnosis not present

## 2017-04-09 DIAGNOSIS — Z0000 Encounter for general adult medical examination without abnormal findings: Secondary | ICD-10-CM | POA: Diagnosis not present

## 2017-04-09 DIAGNOSIS — E119 Type 2 diabetes mellitus without complications: Secondary | ICD-10-CM | POA: Diagnosis not present

## 2017-04-09 DIAGNOSIS — G4700 Insomnia, unspecified: Secondary | ICD-10-CM

## 2017-04-09 DIAGNOSIS — I10 Essential (primary) hypertension: Secondary | ICD-10-CM

## 2017-04-09 DIAGNOSIS — E785 Hyperlipidemia, unspecified: Secondary | ICD-10-CM | POA: Diagnosis not present

## 2017-04-09 DIAGNOSIS — Z853 Personal history of malignant neoplasm of breast: Secondary | ICD-10-CM | POA: Diagnosis not present

## 2017-04-09 DIAGNOSIS — R600 Localized edema: Secondary | ICD-10-CM

## 2017-04-09 LAB — COMPLETE METABOLIC PANEL WITH GFR
ALT: 18 U/L (ref 6–29)
AST: 25 U/L (ref 10–35)
Albumin: 4.4 g/dL (ref 3.6–5.1)
Alkaline Phosphatase: 91 U/L (ref 33–130)
BILIRUBIN TOTAL: 0.6 mg/dL (ref 0.2–1.2)
BUN: 20 mg/dL (ref 7–25)
CHLORIDE: 104 mmol/L (ref 98–110)
CO2: 19 mmol/L — AB (ref 20–31)
CREATININE: 1.03 mg/dL — AB (ref 0.50–0.99)
Calcium: 9.5 mg/dL (ref 8.6–10.4)
GFR, EST AFRICAN AMERICAN: 65 mL/min (ref >=60)
GFR, Est Non African American: 56 mL/min — ABNORMAL LOW (ref >=60)
Glucose, Bld: 71 mg/dL (ref 65–99)
Potassium: 3.9 mmol/L (ref 3.5–5.3)
Sodium: 139 mmol/L (ref 135–146)
TOTAL PROTEIN: 7.7 g/dL (ref 6.1–8.1)

## 2017-04-09 LAB — LIPID PANEL
Cholesterol: 211 mg/dL — ABNORMAL HIGH (ref <200)
HDL: 142 mg/dL (ref >50)
LDL CALC: 60 mg/dL (ref <100)
TRIGLYCERIDES: 46 mg/dL (ref <150)
Total CHOL/HDL Ratio: 1.5 Ratio (ref <5.0)
VLDL: 9 mg/dL (ref <30)

## 2017-04-09 LAB — URINALYSIS, ROUTINE W REFLEX MICROSCOPIC
Bilirubin Urine: NEGATIVE
Glucose, UA: NEGATIVE
Hgb urine dipstick: NEGATIVE
Ketones, ur: NEGATIVE
Leukocytes, UA: NEGATIVE
NITRITE: NEGATIVE
PH: 7.5 (ref 5.0–8.0)
Protein, ur: NEGATIVE
SPECIFIC GRAVITY, URINE: 1.005 (ref 1.001–1.035)

## 2017-04-09 LAB — CBC WITH DIFFERENTIAL/PLATELET
BASOS PCT: 0 %
Basophils Absolute: 0 cells/uL (ref 0–200)
Eosinophils Absolute: 78 cells/uL (ref 15–500)
Eosinophils Relative: 2 %
HEMATOCRIT: 36.7 % (ref 35.0–45.0)
Hemoglobin: 12.4 g/dL (ref 11.7–15.5)
LYMPHS PCT: 43 %
Lymphs Abs: 1677 cells/uL (ref 850–3900)
MCH: 32 pg (ref 27.0–33.0)
MCHC: 33.8 g/dL (ref 32.0–36.0)
MCV: 94.6 fL (ref 80.0–100.0)
MONO ABS: 312 {cells}/uL (ref 200–950)
MONOS PCT: 8 %
MPV: 10.1 fL (ref 7.5–12.5)
NEUTROS PCT: 47 %
Neutro Abs: 1833 cells/uL (ref 1500–7800)
PLATELETS: 239 10*3/uL (ref 140–400)
RBC: 3.88 MIL/uL (ref 3.80–5.10)
RDW: 13.6 % (ref 11.0–15.0)
WBC: 3.9 10*3/uL (ref 3.8–10.8)

## 2017-04-09 LAB — IRON AND TIBC
%SAT: 21 % (ref 11–50)
IRON: 69 ug/dL (ref 45–160)
TIBC: 326 ug/dL (ref 250–450)
UIBC: 257 ug/dL

## 2017-04-09 MED ORDER — ZALEPLON 5 MG PO CAPS: 5.0000 mg | ORAL_CAPSULE | Freq: Every evening | ORAL | 1 refills | Status: DC | PRN

## 2017-04-09 NOTE — Progress Notes (Signed)
Patient ID: [name], female   DOB: 12/23/1950, 66 y.o.   MRN: [id number]   Location:  [facility]  Place of Service:  OFFICE  Provider: Arletha Grippe, DO  Patient Care Team: Makayla Cranker, DO as PCP - General (Internal Medicine)  Extended Emergency Contact Information Primary Emergency Contact: [place] Address: [address]          [place], [place] [address] [name] of [place] Phone: [phone] Relation: Daughter  Code Status: FULL CODE Goals of Care: Advanced Directive information Advanced Directives 04/09/2017  Does Patient Have a Medical Advance Directive? No  Would patient like information on creating a medical advance directive? -     Chief Complaint  Patient presents with  . Annual Exam    Physical exam   . Headache    real bad headache last week, had to stay home.   [name] Kitchen MMSE    28/30 passed clock drawing    HPI: Patient is a 66 y.o. female seen in today for an annual wellness exam. She had a severe HA last week but it resolved on its own. She has difficulty sleeping and staying asleep. Tried melatonin in the past but was ineffective. She has not tried [place]. Benadryl not helping.  DM - controlled with A1c 6.3%. Takes metformin. Occasional low BS reactions. Urine micro/Cr ratio 3.  HTN/hx CHF/atrial flutter - BP controlled on amlodipine, clonidine, HCTZ, losartan, core. Takes eliquis for anticoagulation. Hgb 11.1. Followed by cardiology Dr Caryl Comes  Hyperlipidemia - LDL 68 on lipitor  Arthritis/LBP - stable on prn tramadol.  Claustrophobia -stable  CKD - stable. Cr 1.29  Hx breast CA - tx with lumpectomy and XRT. No recurrence. She is UTD on mammograms  RLS - stable. She stopped requip several mos ago  Depression screen [facility] 2/9 04/09/2017 04/10/2016 09/11/2014 03/07/2014 12/21/2012  Decreased Interest 0 0 - 0 1  Down, Depressed, Hopeless 0 0 2 0 0  PHQ - 2 Score 0 0 2 0 1  Altered sleeping - - 1 - -  Tired, decreased energy - - 2 - -    Change in appetite - - 2 - -  Feeling bad or failure about yourself  - - 0 - -  Trouble concentrating - - 0 - -  Moving slowly or fidgety/restless - - 0 - -  Suicidal thoughts - - 0 - -  PHQ-9 Score - - 7 - -    Fall Risk  04/09/2017 10/16/2016 04/10/2016 10/11/2015 07/05/2015  Falls in the past year? No No No No No  Number falls in past yr: - - - - -  Injury with Fall? - - - - -   MMSE - [facility] Exam 04/09/2017 04/10/2016 04/05/2015  Orientation to time '5 5 5  '$ Orientation to Place '5 5 5  '$ Registration '3 3 3  '$ Attention/ Calculation '4 5 4  '$ Recall '3 2 2  '$ Language- name 2 objects '2 2 2  '$ Language- repeat '1 1 1  '$ Language- follow 3 step command '3 3 3  '$ Language- read & follow direction '1 1 1  '$ Write a sentence '1 1 1  '$ Copy design 0 0 1  Total score '28 28 28     '$ Health Maintenance  Topic Date Due  . Hepatitis C Screening  1950-12-10  . OPHTHALMOLOGY EXAM  12/10/2014  . PNA vac Low Risk Adult (2 of 2 - PPSV23) 03/08/2015  . INFLUENZA VACCINE  04/07/2017  . FOOT EXAM  04/10/2017  .  HEMOGLOBIN A1C  04/15/2017  . MAMMOGRAM  12/19/2018  . TETANUS/TDAP  11/30/2023  . COLONOSCOPY  01/17/2025  . DEXA SCAN  Completed    Urinary incontinence? No issues  Exercise? Goes to [facility] at least 3 days per week  Diet? Usually maintains healthy food choices  Hearing: no issues. No tinnitus    Dentition: she is seen by dentist on a regular basis  Pain: she has chronic knee pain but stable  Past Medical History:  Diagnosis Date  . Anxiety   . Arthritis    osteoarthritis  . Breast cancer ([place]) 2008   right, ER/PR -  . CHF (congestive heart failure) ([place])   . Depression   . Diabetes mellitus    TYPE 2  . Gastroparesis   . Hx of radiation therapy 04/04/07 to 05/20/07   right breast/6260 cGy  . Hypercholesterolemia   . Hypertension   . Lumbago   . Malignant neoplasm of breast (female), unspecified site   . Nonspecific elevation of levels of transaminase or lactic acid dehydrogenase  (LDH)   . Osteoarthrosis, unspecified whether generalized or localized, unspecified site   . Regional enteritis of unspecified site   . Restless legs syndrome (RLS)   . TIA (transient ischemic attack)   . Urinary frequency   . Uterine prolapse without mention of vaginal wall prolapse   . Vitamin deficiency     Past Surgical History:  Procedure Laterality Date  . ABDOMINAL HYSTERECTOMY  1997   TAH/BSO, ENDOMETRIAL SARCOMA  . ARTHROPLASTY     left knee  . BREAST LUMPECTOMY Right 11/17/06   re-excision 01/13/07  . RADICAL HYSTERECTOMY  1997  . TOTAL KNEE ARTHROPLASTY Left 06/10/2011      . TOTAL KNEE ARTHROPLASTY Right 2006, 2009   Dr Para March  . TOTAL KNEE ARTHROPLASTY Left 2008   Dr Para March  . WRIST SURGERY     carpel tunnel    Family History  Problem Relation Age of Onset  . Diabetes Mother   . Diabetes Father   . Aneurysm Sister   . Arthritis Sister   . Diabetes Brother   . Heart Problems Brother   . Colon cancer Neg Hx    Family Status  Relation Status  . Mother Alive  . Father Deceased  . Sister Deceased  . Brother Deceased  . Daughter Alive  . Son Alive  . Sister Deceased       Cause of Death: Aneurysm  . Sister Alive  . Brother Alive  . Brother Alive  . Brother Alive  . Son Alive  . Son Alive  . MGM Deceased  . MGF Deceased  . PGM Deceased  . PGF Deceased  . Daughter Alive  . Neg Hx (Not Specified)    Social History   Social History  . Marital status: Widowed    Spouse name: N/A  . Number of children: 4  . Years of education: N/A   Occupational History  . [employer]    Social History Main Topics  . Smoking status: Former Smoker    Packs/day: 0.50    Years: 20.00    Quit date: 09/08/1991  . Smokeless tobacco: Never Used  . Alcohol use No  . Drug use: No  . Sexual activity: No   Other Topics Concern  . Not on file   Social History Narrative   Widowed   Lives with daughter   Former smoker-stopped 1993   Alcohol none   Exercise  3 days a  [facility]     Allergies  Allergen Reactions  . Hydrocodone Nausea And Vomiting  . Lisinopril     Allergies as of 04/09/2017      Reactions   Hydrocodone Nausea And Vomiting   Lisinopril       Medication List       Accurate as of 04/09/17 11:09 AM. Always use your most recent med list.          amLODipine 10 MG tablet Commonly known as:  NORVASC TAKE 1 TABLET ONCE DAILY TOCONTROL BLOOD PRESSURE   atorvastatin 40 MG tablet Commonly known as:  LIPITOR TAKE 1 TABLET ONCE DAILY   FOR CHOLESTEROL   carvedilol 25 MG tablet Commonly known as:  COREG TAKE 1 TABLET TWICE DAILY  WITH MEALS   cloNIDine 0.1 MG tablet Commonly known as:  CATAPRES Take 1 tablet (0.1 mg total) by mouth daily as needed. Only if blood pressure is greater than 160/100   clotrimazole-betamethasone cream Commonly known as:  LOTRISONE Apply 1 application topically 2 (two) times daily.   ELIQUIS 5 MG Tabs tablet Generic drug:  apixaban TAKE 1 TABLET (5 MG TOTAL) BY MOUTH 2 (TWO) TIMES DAILY.   EPINEPHrine 0.3 mg/0.3 mL Soaj injection Commonly known as:  EPIPEN Inject 0.3 mLs (0.3 mg total) into the muscle once.   glucose blood test strip Commonly known as:  ONE TOUCH TEST STRIPS Use as instructed- supply enough for twice daily testing.   Iron 325 (65 Fe) MG Tabs Take by mouth daily.   losartan 50 MG tablet Commonly known as:  COZAAR Take one tablet by mouth once daily to control blood pressure   metFORMIN 1000 MG tablet Commonly known as:  GLUCOPHAGE Take one tablet by mouth twice daily with meal to control blood sugar   metFORMIN 1000 MG tablet Commonly known as:  GLUCOPHAGE TAKE 1 TABLET TWICE A DAY  WITH MEALS   multivitamin per tablet Take 1 tablet by mouth daily.   ONE TOUCH TEST STRIP HOLDER Misc as directed.   traMADol 50 MG tablet Commonly known as:  ULTRAM Take 1 tablet (50 mg total) by mouth 2 (two) times daily as needed.        Review of  Systems:  Review of Systems  Musculoskeletal: Positive for arthralgias.  Neurological: Positive for dizziness and headaches.  Psychiatric/Behavioral: Positive for sleep disturbance.  All other systems reviewed and are negative.   Physical Exam: Vitals:   04/09/17 1023  BP: 138/86  Pulse: 70  Temp: 97.6 F (36.4 C)  TempSrc: Oral  SpO2: 98%  Weight: 173 lb (78.5 kg)  Height: 5\' 6"  (1.676 m)   Body mass index is 27.92 kg/m. Physical Exam  Constitutional: She is oriented to person, place, and time. She appears well-developed and well-nourished. No distress.  HENT:  Head: Normocephalic and atraumatic.  Right Ear: External ear normal.  Left Ear: External ear normal.  Mouth/Throat: Oropharynx is clear and moist. No oropharyngeal exudate.  MMM; no oral thrush  Eyes: Pupils are equal, round, and reactive to light. EOM are normal. No scleral icterus.  Neck: Normal range of motion. Neck supple. Carotid bruit is not present. No tracheal deviation present. No thyromegaly present.  Cardiovascular: Normal rate, regular rhythm and intact distal pulses.  Exam reveals no gallop and no friction rub.   No murmur heard. No LE edema b/l. No calf TTP  Pulmonary/Chest: Effort normal and breath sounds normal. No respiratory distress. She has no  wheezes. She has no rales. She exhibits no tenderness. Right breast exhibits skin change (2/2 scar). Right breast exhibits no inverted nipple, no mass, no nipple discharge and no tenderness. Left breast exhibits no inverted nipple, no mass, no nipple discharge, no skin change and no tenderness. Breasts are asymmetrical.    No rhonchi  Abdominal: Soft. Bowel sounds are normal. She exhibits no distension and no mass. There is no hepatosplenomegaly or hepatomegaly. There is no tenderness. There is no rebound and no guarding. No hernia.  Genitourinary:  Genitourinary Comments: Deferred to GYN  Musculoskeletal: She exhibits edema. She exhibits no deformity.    Lymphadenopathy:    She has no cervical adenopathy.  Neurological: She is alert and oriented to person, place, and time. She has normal reflexes.  Skin: Skin is warm and dry. No rash noted.  Psychiatric: She has a normal mood and affect. Her behavior is normal. Judgment and thought content normal.  Vitals reviewed.  Diabetic Foot Exam - Simple   Simple Foot Form Diabetic Foot exam was performed with the following findings:  Yes 04/09/2017 11:42 AM  Visual Inspection See comments:  Yes Sensation Testing Intact to touch and monofilament testing bilaterally:  Yes Pulse Check Posterior Tibialis and Dorsalis pulse intact bilaterally:  Yes Comments Calluses b/l; toenail dystrophy b/l; no ulceration      Labs reviewed:  Basic Metabolic Panel:  Recent Labs  10/16/16 0001  NA 142  K 3.7  CL 106  CO2 24  GLUCOSE 71  BUN 18  CREATININE 0.95  CALCIUM 9.3   Liver Function Tests:  Recent Labs  10/16/16 0001  ALT 18   No results for input(s): LIPASE, AMYLASE in the last 8760 hours. No results for input(s): AMMONIA in the last 8760 hours. CBC:  Recent Labs  12/21/16 1136  WBC 6.9  NEUTROABS 4.3  HGB 11.2  HCT 32.9*  MCV 93  PLT 243   Lipid Panel:  Recent Labs  10/16/16 0001  CHOL 205*  HDL 116  LDLCALC 80  TRIG 43  CHOLHDL 1.8   Lab Results  Component Value Date   HGBA1C 6.0 (H) 10/16/2016    Procedures: No results found.  Assessment/Plan   ICD-10-CM   1. Well adult exam Z00.00   2. Insomnia, unspecified type G47.00   3. Type 2 diabetes mellitus without complication, without long-term current use of insulin (HCC) E11.9 CMP with eGFR    Hemoglobin A1c    Urinalysis with Reflex Microscopic    Microalbumin/Creatinine Ratio, Urine  4. Bilateral edema of lower extremity R60.0   5. HYPERTENSION, BENIGN SYSTEMIC I10   6. Anemia, chronic disease D63.8 CBC with Differential/Platelets    Ferritin    Iron and TIBC  7. Hyperlipidemia LDL goal <100 E78.5  Lipid Panel    TSH  8. History of malignant neoplasm of right breast Z85.3    START SONATA TO HELP SLEEP.   Continue other medications as ordered  Will call with lab results  Follow up in 6 mos DM, HTN, hyperlipidemia  Keeping You Healthy handout given   Cordella Register. Perlie Gold  [facility] [address] [place], [place] [address] [phone] Cell (Monday-Friday 8 AM - 5 PM) [phone] After 5 PM and follow prompts

## 2017-04-09 NOTE — Patient Instructions (Addendum)
START SONATA TO HELP SLEEP.   Continue other medications as ordered  Will call with lab results  Follow up in 6 mos DM, HTN, hyperlipidemia  Keeping You Healthy  Get These Tests  Blood Pressure- Have your blood pressure checked by your healthcare provider at least once a year.  Normal blood pressure is 120/80.  Weight- Have your body mass index (BMI) calculated to screen for obesity.  BMI is a measure of body fat based on height and weight.  You can calculate your own BMI at [employer]velB[employer].it  Cholesterol- Have your cholesterol checked every year.  Diabetes- Have your blood sugar checked every year if you have high blood pressure, high cholesterol, a family history of diabetes or if you are overweight.  Pap Test - Have a pap test every 1 to 5 years if you have been sexually active.  If you are older than 65 and recent pap tests have been normal you may not need additional pap tests.  In addition, if you have had a hysterectomy  for benign disease additional pap tests are not necessary.  Mammogram-Yearly mammograms are essential for early detection of breast cancer  Screening for Colon Cancer- Colonoscopy starting at age 68. Screening may begin sooner depending on your family history and other health conditions.  Follow up colonoscopy as directed by your Gastroenterologist.  Screening for Osteoporosis- Screening begins at age 68 with bone density scanning, sooner if you are at higher risk for developing Osteoporosis.  Get these medicines  Calcium with Vitamin D- Your body requires 1200-1500 mg of Calcium a day and [phone] IU of Vitamin D a day.  You can only absorb 500 mg of Calcium at a time therefore Calcium must be taken in 2 or 3 separate doses throughout the day.  Hormones- Hormone therapy has been associated with increased risk for certain cancers and heart disease.  Talk to your healthcare provider about if you need relief from menopausal symptoms.  Aspirin- Ask your  healthcare provider about taking Aspirin to prevent Heart Disease and Stroke.  Get these Immuniztions  Flu shot- Every fall  Pneumonia shot- Once after the age of 68; if you are younger ask your healthcare provider if you need a pneumonia shot.  Tetanus- Every ten years.  Zostavax- Once after the age of 68 to prevent shingles.  Take these steps  Don't smoke- Your healthcare provider can help you quit. For tips on how to quit, ask your healthcare provider or go to www.smokefree.gov or call 1-800 QUIT-NOW.  Be physically active- Exercise 5 days a week for a minimum of 30 minutes.  If you are not already physically active, start slow and gradually work up to 30 minutes of moderate physical activity.  Try walking, dancing, bike riding, swimming, etc.  Eat a healthy diet- Eat a variety of healthy foods such as fruits, vegetables, whole grains, low fat milk, low fat cheeses, yogurt, lean meats, chicken, fish, eggs, dried beans, tofu, etc.  For more information go to www.thenutritionsource.org  Dental visit- Brush and floss teeth twice daily; visit your dentist twice a year.  Eye exam- Visit your Optometrist or Ophthalmologist yearly.  Drink alcohol in moderation- Limit alcohol intake to one drink or less a day.  Never drink and drive.  Depression- Your emotional health is as important as your physical health.  If you're feeling down or losing interest in things you normally enjoy, please talk to your healthcare provider.  Seat Belts- can save your life; always wear one  Smoke/Carbon Monoxide detectors- These detectors need to be installed on the appropriate level of your home.  Replace batteries at least once a year.  Violence- If anyone is threatening or hurting you, please tell your healthcare provider.  Living Will/ Health care power of attorney- Discuss with your healthcare provider and family.

## 2017-04-10 LAB — MICROALBUMIN / CREATININE URINE RATIO
CREATININE, URINE: 12 mg/dL — AB (ref 20–320)
MICROALB UR: 0.6 mg/dL
Microalb Creat Ratio: 50 mcg/mg creat — ABNORMAL HIGH (ref <30)

## 2017-04-10 LAB — TSH: TSH: 1.35 mIU/L

## 2017-04-10 LAB — HEMOGLOBIN A1C
HEMOGLOBIN A1C: 5.9 % — AB (ref <5.7)
Mean Plasma Glucose: 123 mg/dL

## 2017-04-10 LAB — FERRITIN: Ferritin: 74 ng/mL (ref 20–288)

## 2017-04-11 ENCOUNTER — Other Ambulatory Visit: Payer: Self-pay | Admitting: Internal Medicine

## 2017-07-08 ENCOUNTER — Other Ambulatory Visit: Payer: Self-pay | Admitting: Internal Medicine

## 2017-07-08 NOTE — Telephone Encounter (Signed)
Eliquis 5mg  refill received; pt is 66 yrs old, wt-78.5kg, Crea-1.03 on 04/09/17, last seen by Dr. Caryl Comes on 12/21/16. Will refill requested refill.

## 2017-07-12 ENCOUNTER — Other Ambulatory Visit: Payer: Self-pay | Admitting: Internal Medicine

## 2017-07-12 DIAGNOSIS — E119 Type 2 diabetes mellitus without complications: Secondary | ICD-10-CM

## 2017-07-13 ENCOUNTER — Other Ambulatory Visit: Payer: Self-pay

## 2017-07-13 DIAGNOSIS — M1990 Unspecified osteoarthritis, unspecified site: Secondary | ICD-10-CM

## 2017-07-13 MED ORDER — TRAMADOL HCL 50 MG PO TABS: 50.0000 mg | ORAL_TABLET | Freq: Two times a day (BID) | ORAL | 1 refills | Status: DC | PRN

## 2017-08-10 ENCOUNTER — Ambulatory Visit (INDEPENDENT_AMBULATORY_CARE_PROVIDER_SITE_OTHER): Payer: BLUE CROSS/BLUE SHIELD

## 2017-08-10 ENCOUNTER — Other Ambulatory Visit: Payer: Self-pay

## 2017-08-10 ENCOUNTER — Ambulatory Visit (HOSPITAL_COMMUNITY)
Admission: EM | Admit: 2017-08-10 | Discharge: 2017-08-10 | Disposition: A | Discharge status: Home / Self Care | Payer: BLUE CROSS/BLUE SHIELD | Attending: Internal Medicine | Admitting: Internal Medicine

## 2017-08-10 ENCOUNTER — Encounter (HOSPITAL_COMMUNITY): Payer: Self-pay | Admitting: Emergency Medicine

## 2017-08-10 DIAGNOSIS — W2203XA Walked into furniture, initial encounter: Secondary | ICD-10-CM

## 2017-08-10 DIAGNOSIS — S92515A Nondisplaced fracture of proximal phalanx of left lesser toe(s), initial encounter for closed fracture: Secondary | ICD-10-CM

## 2017-08-10 NOTE — ED Triage Notes (Signed)
Pt jammed her left 2nd toe on a book shelf last night.  Pt reports pain under the foot, with trouble baring weight on the foot.

## 2017-08-10 NOTE — Discharge Instructions (Signed)
Use of boot with weight bearing. Weight bearing as tolerated, elevate and ice for 10 minutes every hour. Tylenol for pain control. Follow up with [facility] for review and further work restrictions.

## 2017-08-10 NOTE — ED Provider Notes (Signed)
[place]    CSN: [id number] Arrival date & time: 08/10/17  1009     History   Chief Complaint Chief Complaint  Patient presents with  . Toe Injury    2nd left    HPI [name] is a 66 y.o. female.   Jahleah presents with complaints of left toe pain after she accidentally kicked a bookshelf last night at 2000. She states it is painful with weight bearing, rating pain 6/10. Pain is to the plantar surface of pointer toe through ring toe. Denies any previous injury to foot or toes. Took tylenol last night which did help. Has not taken any medications today. Without numbness or tingling, minimal swelling or bruising present.    ROS per HPI.       Past Medical History:  Diagnosis Date  . Anxiety   . Arthritis    osteoarthritis  . Breast cancer ([place]) 2008   right, ER/PR -  . CHF (congestive heart failure) ([place])   . Depression   . Diabetes mellitus    TYPE 2  . Gastroparesis   . Hx of radiation therapy 04/04/07 to 05/20/07   right breast/6260 cGy  . Hypercholesterolemia   . Hypertension   . Lumbago   . Malignant neoplasm of breast (female), unspecified site   . Nonspecific elevation of levels of transaminase or lactic acid dehydrogenase (LDH)   . Osteoarthrosis, unspecified whether generalized or localized, unspecified site   . Regional enteritis of unspecified site   . Restless legs syndrome (RLS)   . TIA (transient ischemic attack)   . Urinary frequency   . Uterine prolapse without mention of vaginal wall prolapse   . Vitamin deficiency     Patient Active Problem List   Diagnosis Date Noted  . Claustrophobia 07/05/2015  . Bilateral edema of lower extremity 09/11/2014  . CKD stage 3 due to type 2 diabetes mellitus ([place]) 03/07/2014  . Restless leg syndrome 11/29/2013  . Other and unspecified hyperlipidemia 11/29/2013  . Osteoarthritis 11/29/2013  . CKD (chronic kidney disease) 08/16/2013  . Insomnia 08/16/2013  . Atrial flutter- not  confirmed on ECG review 12/15/2012  . Loss of weight 12/15/2012  . Gastroparesis 12/14/2012  . Renal impairment 12/14/2012  . History of malignant neoplasm of right breast 01/01/2012  . Arthritis   . Hx of radiation therapy   . OSTEOARTHRITIS, HANDS, BILATERAL 05/29/2009  . CHEST PAIN, ATYPICAL 02/09/2008  . LOW BACK PAIN, ACUTE 01/03/2007  . Diabetes mellitus due to underlying condition with diabetic nephropathy ([place]) 11/04/2006  . HYPERCHOLESTEROLEMIA 11/04/2006  . HYPERTENSION, BENIGN SYSTEMIC 11/04/2006  . Cardiomyopathy-recovered 11/04/2006  . MENOPAUSAL SYNDROME 11/04/2006  . OSTEOARTHRITIS, LOWER LEG 11/04/2006  . GAS/BLOATING 11/04/2006    Past Surgical History:  Procedure Laterality Date  . ABDOMINAL HYSTERECTOMY  1997   TAH/BSO, ENDOMETRIAL SARCOMA  . ARTHROPLASTY     left knee  . BREAST LUMPECTOMY Right 11/17/06   re-excision 01/13/07  . RADICAL HYSTERECTOMY  1997  . TOTAL KNEE ARTHROPLASTY Left 06/10/2011      . TOTAL KNEE ARTHROPLASTY Right 2006, 2009   Dr Para March  . TOTAL KNEE ARTHROPLASTY Left 2008   Dr Para March  . WRIST SURGERY     carpel tunnel    OB History    No data available       Home Medications    Prior to Admission medications   Medication Sig Start Date End Date Taking? Authorizing Provider  amLODipine (NORVASC) 10 MG tablet TAKE  1 TABLET ONCE DAILY TOCONTROL BLOOD PRESSURE 04/12/17  Yes Eulas Post, Monica, DO  atorvastatin (LIPITOR) 40 MG tablet TAKE 1 TABLET ONCE DAILY   FOR CHOLESTEROL 07/13/17  Yes Gildardo Cranker, DO  Blood Glucose Monitoring Suppl (ONE TOUCH TEST STRIP HOLDER) MISC as directed.     Yes [provider]  carvedilol (COREG) 25 MG tablet TAKE 1 TABLET TWICE DAILY  WITH MEALS 12/07/16  Yes Gildardo Cranker, DO  cloNIDine (CATAPRES) 0.1 MG tablet Take 1 tablet (0.1 mg total) by mouth daily as needed. Only if blood pressure is greater than 160/100 05/08/14  Yes Pandey, Mahima, MD  clotrimazole-betamethasone (LOTRISONE) cream Apply 1  application topically 2 (two) times daily. 04/10/16  Yes Carter, Monica, DO  ELIQUIS 5 MG TABS tablet TAKE 1 TABLET BY MOUTH TWICE A DAY 07/08/17  Yes Deboraha Sprang, MD  Ferrous Sulfate (IRON) 325 (65 Fe) MG TABS Take by mouth daily.   Yes [provider]  losartan (COZAAR) 50 MG tablet TAKE 1 TABLET ONCE DAILY TOCONTROL BLOOD PRESSURE 07/13/17  Yes Eulas Post, Brayton Layman, DO  metFORMIN (GLUCOPHAGE) 1000 MG tablet TAKE 1 TABLET TWICE A DAY  WITH MEALS 07/13/17  Yes Gildardo Cranker, DO  multivitamin [facility]) per tablet Take 1 tablet by mouth daily. 06/11/11  Yes Cletus Gash, MD  traMADol (ULTRAM) 50 MG tablet Take 1 tablet (50 mg total) 2 (two) times daily as needed by mouth. 07/13/17  Yes Eulas Post, Monica, DO  EPINEPHrine (EPIPEN) 0.3 mg/0.3 mL DEVI Inject 0.3 mLs (0.3 mg total) into the muscle once. 01/25/13   Lauree Chandler, NP  glucose blood (ONE TOUCH TEST STRIPS) test strip Use as instructed- supply enough for twice daily testing. 05/29/11 02/17/13  Zenia Resides, MD  zaleplon (SONATA) 5 MG capsule Take 1 capsule (5 mg total) by mouth at bedtime as needed for sleep. 04/09/17   Gildardo Cranker, DO    Family History Family History  Problem Relation Age of Onset  . Diabetes Mother   . Diabetes Father   . Aneurysm Sister   . Arthritis Sister   . Diabetes Brother   . Heart Problems Brother   . Colon cancer Neg Hx     Social History Social History   Tobacco Use  . Smoking status: Former Smoker    Packs/day: 0.50    Years: 20.00    Pack years: 10.00    Last attempt to quit: 09/08/1991    Years since quitting: 25.9  . Smokeless tobacco: Never Used  Substance Use Topics  . Alcohol use: No  . Drug use: No     Allergies   Hydrocodone and Lisinopril   Review of Systems Review of Systems   Physical Exam Triage Vital Signs ED Triage Vitals  Enc Vitals Group     BP 08/10/17 1032 111/65     Pulse Rate 08/10/17 1032 68     Resp --      Temp 08/10/17 1032 (!) 97 F  (36.1 C)     Temp src --      SpO2 08/10/17 1032 98 %     Weight --      Height --      Head Circumference --      Peak Flow --      Pain Score 08/10/17 1030 6     Pain Loc --      Pain Edu? --      Excl. in [place]? --    No data found.  Updated  Vital Signs BP 111/65 (BP Location: Left Arm)   Pulse 68   Temp (!) 97 F (36.1 C)   SpO2 98%   Visual Acuity Right Eye Distance:   Left Eye Distance:   Bilateral Distance:    Right Eye Near:   Left Eye Near:    Bilateral Near:     Physical Exam  Constitutional: She is oriented to person, place, and time. She appears well-developed and well-nourished. No distress.  Cardiovascular: Normal rate, regular rhythm and normal heart sounds.  Pulmonary/Chest: Effort normal and breath sounds normal.  Musculoskeletal:       Left foot: There is decreased range of motion, tenderness and bony tenderness. There is no swelling, normal capillary refill, no crepitus, no deformity and no laceration.  Tenderness at MTP of pointer, middle and ring toes, pain with passive and active rom; tenderness also to all pip joints as well; without obviously swelling or bruising present; sensation intact; ambulatory; strong pedal pulse  Neurological: She is alert and oriented to person, place, and time.  Skin: Skin is warm and dry.     UC Treatments / Results  Labs (all labs ordered are listed, but only abnormal results are displayed) Labs Reviewed - No data to display  EKG  EKG Interpretation None       Radiology Dg Foot Complete Left  Result Date: 08/10/2017 CLINICAL DATA:  Foot injury EXAM: LEFT FOOT - COMPLETE 3+ VIEW COMPARISON:  None. FINDINGS: With mildly displaced fractures second, third and fourth proximal phalanges. No other fracture or arthropathy. Mild calcaneal spurring IMPRESSION: Mildly displaced fracture second, third, fourth proximal phalanges. Electronically Signed   By: Franchot Gallo M.D.   On: 08/10/2017 11:31     Procedures Procedures (including critical care time)  Medications Ordered in UC Medications - No data to display   Initial Impression / Assessment and Plan / UC Course  I have reviewed the triage vital signs and the nursing notes.  Pertinent labs & imaging results that were available during my care of the patient were reviewed by me and considered in my medical decision making (see chart for details).     Fractures as noted per xray. Cam boot applied prior to departure, to wear with weight bearing. RICE therapy, tylenol for pain control. Follow with [facility] for reevaluation and management of work restrictions. Patient verbalized understanding and agreeable to plan.    Final Clinical Impressions(s) / UC Diagnoses   Final diagnoses:  Closed nondisplaced fracture of proximal phalanx of lesser toe of left foot, initial encounter    ED Discharge Orders    None       Controlled Substance Prescriptions [place] Controlled Substance Registry consulted? NA   Augusto Gamble B, NP 08/10/17 1152

## 2017-08-11 ENCOUNTER — Encounter: Payer: Self-pay | Admitting: Podiatry

## 2017-08-11 ENCOUNTER — Ambulatory Visit (INDEPENDENT_AMBULATORY_CARE_PROVIDER_SITE_OTHER): Payer: BLUE CROSS/BLUE SHIELD | Admitting: Podiatry

## 2017-08-11 DIAGNOSIS — S92512G Displaced fracture of proximal phalanx of left lesser toe(s), subsequent encounter for fracture with delayed healing: Secondary | ICD-10-CM

## 2017-08-11 DIAGNOSIS — S99922A Unspecified injury of left foot, initial encounter: Secondary | ICD-10-CM

## 2017-08-11 NOTE — Progress Notes (Signed)
   Subjective:    Patient ID: [name], female    DOB: 1951/07/05, 66 y.o.   MRN: [id number]  HPI    Review of Systems  All other systems reviewed and are negative.      Objective:   Physical Exam        Assessment & Plan:

## 2017-08-11 NOTE — Progress Notes (Signed)
Subjective:   Patient ID: [name], female   DOB: 66 y.o.   MRN: [id number]   HPI Patient presents stating that she broke toes on her left foot and she is having trouble walking and she is in a boot but is very uncomfortable and she wants to know when she will be able to return to work   Review of Systems  All other systems reviewed and are negative.       Objective:  Physical Exam  Constitutional: She appears well-developed and well-nourished.  Cardiovascular: Intact distal pulses.  Pulmonary/Chest: Effort normal.  Musculoskeletal: Normal range of motion.  Neurological: She is alert.  Skin: Skin is warm.  Nursing note and vitals reviewed.   Neurovascular status intact muscle strength adequate range of motion within normal limits with patient found to have edema in the lesser digits left 234 with pain and also moderate discomfort in the metatarsal phalangeal joint.  Patient does not smoke and likes to be active and was noted to have good digital perfusion and is well oriented x3     Assessment:  Fracture of the lesser digits 234 left confirmed on x-ray taken prior     Plan:  Reviewed condition and reviewed H&P and today went ahead and dispensed a surgical shoe to take all pressure off the toes

## 2017-08-23 DIAGNOSIS — M79676 Pain in unspecified toe(s): Secondary | ICD-10-CM

## 2017-08-25 ENCOUNTER — Encounter: Payer: BLUE CROSS/BLUE SHIELD | Admitting: Podiatry

## 2017-08-26 ENCOUNTER — Encounter: Payer: Self-pay | Admitting: Podiatry

## 2017-08-26 ENCOUNTER — Ambulatory Visit (INDEPENDENT_AMBULATORY_CARE_PROVIDER_SITE_OTHER): Payer: BLUE CROSS/BLUE SHIELD | Admitting: Podiatry

## 2017-08-26 ENCOUNTER — Ambulatory Visit (INDEPENDENT_AMBULATORY_CARE_PROVIDER_SITE_OTHER): Payer: BLUE CROSS/BLUE SHIELD

## 2017-08-26 DIAGNOSIS — S92512G Displaced fracture of proximal phalanx of left lesser toe(s), subsequent encounter for fracture with delayed healing: Secondary | ICD-10-CM

## 2017-08-26 NOTE — Progress Notes (Signed)
Subjective:   Patient ID: [name], female   DOB: 66 y.o.   MRN: [id number]   HPI Patient presents stating still having discomfort in the second third and fourth toes left and is having trouble wearing shoe gear comfortably   ROS      Objective:  Physical Exam  Neurovascular status intact with patient second third and fourth digits left being swollen and painful when palpated from a dorsal direction     Assessment:  Fractured toes which are healing gradually but still sore     Plan:  H&P condition reviewed and recommended continued wider type shoes and that gradually this will get better will probably take another 6-8 weeks to completely get better.  Patient wants to follow this and will be seen back on an as-needed basis  X-rays indicate that the digits are healing with significant fractures of the lesser digits

## 2017-08-27 ENCOUNTER — Ambulatory Visit: Payer: BLUE CROSS/BLUE SHIELD | Admitting: Podiatry

## 2017-10-05 ENCOUNTER — Other Ambulatory Visit: Payer: Self-pay | Admitting: Internal Medicine

## 2017-10-15 ENCOUNTER — Ambulatory Visit: Payer: BLUE CROSS/BLUE SHIELD | Admitting: Internal Medicine

## 2017-10-15 ENCOUNTER — Encounter: Payer: Self-pay | Admitting: Internal Medicine

## 2017-10-15 ENCOUNTER — Ambulatory Visit (INDEPENDENT_AMBULATORY_CARE_PROVIDER_SITE_OTHER): Payer: BLUE CROSS/BLUE SHIELD | Admitting: Internal Medicine

## 2017-10-15 VITALS — BP 140/80 | HR 80 | Temp 98.0°F | Wt 177.0 lb

## 2017-10-15 DIAGNOSIS — G4700 Insomnia, unspecified: Secondary | ICD-10-CM

## 2017-10-15 DIAGNOSIS — E119 Type 2 diabetes mellitus without complications: Secondary | ICD-10-CM | POA: Diagnosis not present

## 2017-10-15 DIAGNOSIS — N1831 Chronic kidney disease, stage 3a: Secondary | ICD-10-CM | POA: Insufficient documentation

## 2017-10-15 DIAGNOSIS — E1169 Type 2 diabetes mellitus with other specified complication: Secondary | ICD-10-CM

## 2017-10-15 DIAGNOSIS — I10 Essential (primary) hypertension: Secondary | ICD-10-CM

## 2017-10-15 DIAGNOSIS — R600 Localized edema: Secondary | ICD-10-CM

## 2017-10-15 DIAGNOSIS — E785 Hyperlipidemia, unspecified: Secondary | ICD-10-CM

## 2017-10-15 DIAGNOSIS — D638 Anemia in other chronic diseases classified elsewhere: Secondary | ICD-10-CM

## 2017-10-15 NOTE — Patient Instructions (Addendum)
Continue current medications as ordered  May continue OTC topical fungal agent for toes  May continue lavender oil and Non Aspirin med for sleep  Will call with lab results  Follow up with specialists as scheduled  Wear compression stockings ON for 12 hrs and OFF for 12 hrs  Follow up in 6 mos for fasting CPE/ECG/MMSE.

## 2017-10-15 NOTE — Progress Notes (Signed)
Patient ID: [name], female   DOB: 10-10-1950, 66 y.o.   MRN: [id number]   Location:  [facility] OFFICE  Provider: DR Arletha Grippe  Code Status:  Goals of Care:  Advanced Directives 10/15/2017  Does Patient Have a Medical Advance Directive? No  Would patient like information on creating a medical advance directive? No - Patient declined     Chief Complaint  Patient presents with  . Medical Management of Chronic Issues    49[facility] follow-up    HPI: Patient is a 67y.o. female seen today for medical management of chronic diseases.  She needs a Rx for TED stockings to help with leg pain.   Insomnia - She has difficulty sleeping and staying asleep. Tried melatonin in the past but was ineffective. She has not tried [place] Benadryl not helping. Sonata ineffective. She is taking non-ASA PM and lavender which helps.  DM - controlled with A1c 5.9%. Takes metformin. Occasional low BS reactions. Urine micro/Cr ratio 50; LDL 60 She does not check BS at home as she does not have a working glucometer  HTN/hx CHF/atrial flutter - BP stable on amlodipine, clonidine, HCTZ, losartan, coreg. BP at home 130s/70s. Takes eliquis for anticoagulation. Hgb 12.4. Followed by cardiology Dr KCaryl Comes Anemia - likely 2/2 CKD; she has iron deficiency as well. Iron 69; ferritin 74; Hgb 12.4  Hyperlipidemia - LDL 60 on lipitor  Arthritis/LBP - stable on prn tramadol.   Claustrophobia -stable  CKD - stable. Cr 1.03  Hx breast CA - tx with lumpectomy and XRT. No recurrence. She is UTD on mammograms  RLS - stable. She stopped requip several mos ago   Past Medical History:  Diagnosis Date  . Anxiety   . Arthritis    osteoarthritis  . Breast [place] ([place]   right, ER/PR -  . CHF (congestive heart failure) ([place]   . Depression   . Diabetes mellitus    TYPE 2  . Gastroparesis   . Hx of radiation therapy 04/04/07 to 05/20/07   right breast/6260 cGy  . Hypercholesterolemia   . Hypertension   .  Lumbago   . Malignant neoplasm of breast (female), unspecified site   . Nonspecific elevation of levels of transaminase or lactic acid dehydrogenase (LDH)   . Osteoarthrosis, unspecified whether generalized or localized, unspecified site   . Regional enteritis of unspecified site   . Restless legs syndrome (RLS)   . TIA (transient ischemic attack)   . Urinary frequency   . Uterine prolapse without mention of vaginal wall prolapse   . Vitamin deficiency     Past Surgical History:  Procedure Laterality Date  . ABDOMINAL HYSTERECTOMY  1997   TAH/BSO, ENDOMETRIAL SARCOMA  . ARTHROPLASTY     left knee  . BREAST LUMPECTOMY Right 11/17/06   re-excision 01/13/07  . RADICAL HYSTERECTOMY  1997  . TOTAL KNEE ARTHROPLASTY Left 06/10/2011      . TOTAL KNEE ARTHROPLASTY Right 2006, 2009   Dr WPara March . TOTAL KNEE ARTHROPLASTY Left 2008   Dr WPara March . WRIST SURGERY     carpel tunnel     reports that she quit smoking about 26 years ago. She has a 10.00 pack-year smoking history. she has never used smokeless tobacco. She reports that she does not drink alcohol or use drugs. Social History   Socioeconomic History  . Marital status: Widowed    Spouse name: Not on file  . Number of children: 4  . Years  of education: Not on file  . Highest education level: Not on file  Social Needs  . Financial resource strain: Not on file  . Food insecurity - worry: Not on file  . Food insecurity - inability: Not on file  . Transportation needs - medical: Not on file  . Transportation needs - non-medical: Not on file  Occupational History  . Occupation: [employer]  Tobacco Use  . Smoking status: Former Smoker    Packs/day: 0.50    Years: 20.00    Pack years: 10.00    Last attempt to quit: 09/08/1991    Years since quitting: 26.1  . Smokeless tobacco: Never Used  Substance and Sexual Activity  . Alcohol use: No  . Drug use: No  . Sexual activity: No  Other Topics Concern  . Not on file  Social  History Narrative   Widowed   Lives with daughter   Former smoker-stopped 1993   Alcohol none   Exercise 3 days a week [place] [facility] [place] [facility] [employer]     Family History  Problem Relation Age of Onset  . Diabetes Mother   . Diabetes Father   . Aneurysm Sister   . Arthritis Sister   . Diabetes Brother   . Heart Problems Brother   . Colon cancer Neg Hx     Allergies  Allergen Reactions  . Hydrocodone Nausea And Vomiting  . Lisinopril     Outpatient Encounter Medications as of 10/15/2017  Medication Sig  . amLODipine (NORVASC) 10 MG tablet TAKE 1 TABLET ONCE DAILY TOCONTROL BLOOD PRESSURE  . atorvastatin (LIPITOR) 40 MG tablet TAKE 1 TABLET ONCE DAILY   FOR CHOLESTEROL  . Blood Glucose Monitoring Suppl (ONE TOUCH TEST STRIP HOLDER) MISC as directed.    . carvedilol (COREG) 25 MG tablet TAKE 1 TABLET TWICE DAILY  WITH MEALS  . cloNIDine (CATAPRES) 0.1 MG tablet Take 1 tablet (0.1 mg total) by mouth daily as needed. Only if blood pressure is greater than 160/100  . clotrimazole-betamethasone (LOTRISONE) cream Apply 1 application topically 2 (two) times daily.  [name] Kitchen ELIQUIS 5 MG TABS tablet TAKE 1 TABLET BY MOUTH TWICE A DAY  . EPINEPHrine (EPIPEN) 0.3 mg/0.3 mL DEVI Inject 0.3 mLs (0.3 mg total) into the muscle once.  . Ferrous Sulfate (IRON) 325 (65 Fe) MG TABS Take by mouth daily.  [name] Kitchen losartan (COZAAR) 50 MG tablet TAKE 1 TABLET ONCE DAILY TOCONTROL BLOOD PRESSURE  . metFORMIN (GLUCOPHAGE) 1000 MG tablet TAKE 1 TABLET TWICE A DAY  WITH MEALS  . multivitamin (THERAGRAN) per tablet Take 1 tablet by mouth daily.  . traMADol (ULTRAM) 50 MG tablet Take 1 tablet (50 mg total) 2 (two) times daily as needed by mouth.  . [DISCONTINUED] zaleplon (SONATA) 5 MG capsule Take 1 capsule (5 mg total) by mouth at bedtime as needed for sleep.  [name] Kitchen glucose blood (ONE TOUCH TEST STRIPS) test strip Use as instructed- supply enough for twice daily testing.   No facility-administered encounter medications on file as  of 10/15/2017.     Review of Systems:  Review of Systems  Constitutional: Positive for fatigue.  Cardiovascular: Positive for leg swelling.  Musculoskeletal: Positive for arthralgias.  Psychiatric/Behavioral: Positive for sleep disturbance.  All other systems reviewed and are negative.   Health Maintenance  Topic Date Due  . Hepatitis C Screening  Apr 04, 1951  . PNA vac Low Risk Adult (2 of 2 - PPSV23) 03/08/2015  . OPHTHALMOLOGY EXAM  10/09/2017  . HEMOGLOBIN A1C  10/10/2017  . FOOT EXAM  04/09/2018  . MAMMOGRAM  12/19/2018  . TETANUS/TDAP  11/30/2023  . COLONOSCOPY  01/17/2025  . INFLUENZA VACCINE  Completed  . DEXA SCAN  Completed    Physical Exam: Vitals:   10/15/17 0905  BP: 140/80  Pulse: 80  Temp: 98 F (36.7 C)  TempSrc: Oral  SpO2: 97%  Weight: 177 lb (80.3 kg)   Body mass index is 28.57 kg/m. Physical Exam  Constitutional: She is oriented to person, place, and time. She appears well-developed and well-nourished.  HENT:  Mouth/Throat: Oropharynx is clear and moist. No oropharyngeal exudate.  MMM; no oral thrush  Eyes: Pupils are equal, round, and reactive to light. No scleral icterus.  Neck: Neck supple. Carotid bruit is not present. No tracheal deviation present. No thyromegaly present.  Cardiovascular: Normal rate, regular rhythm and intact distal pulses. Exam reveals no gallop and no friction rub.  Murmur (1/6 SEM) heard. +1 ptting LE edema b/l. no calf TTP. Varicose veins b/l in LE  Pulmonary/Chest: Effort normal and breath sounds normal. No stridor. No respiratory distress. She has no wheezes. She has no rales.  Abdominal: Soft. Normal appearance and bowel sounds are normal. She exhibits no distension and no mass. There is no hepatomegaly. There is no tenderness. There is no rigidity, no rebound and no guarding. No hernia.  obese  Musculoskeletal: She exhibits edema.  Lymphadenopathy:    She has no cervical adenopathy.  Neurological: She is alert and  oriented to person, place, and time.  Skin: Skin is warm and dry. No rash noted.  Psychiatric: She has a normal mood and affect. Her behavior is normal. Judgment and thought content normal.    Labs reviewed: Basic Metabolic Panel: Recent Labs    10/16/16 0001 04/09/17 1145  NA 142 139  K 3.7 3.9  CL 106 104  CO2 24 19*  GLUCOSE 71 71  BUN 18 20  CREATININE 0.95 1.03*  CALCIUM 9.3 9.5  TSH  --  1.35   Liver Function Tests: Recent Labs    10/16/16 0001 04/09/17 1145  AST  --  25  ALT 18 18  ALKPHOS  --  91  BILITOT  --  0.6  PROT  --  7.7  ALBUMIN  --  4.4   No results for input(s): LIPASE, AMYLASE in the last 8760 hours. No results for input(s): AMMONIA in the last 8760 hours. CBC: Recent Labs    12/21/16 1136 04/09/17 1145  WBC 6.9 3.9  NEUTROABS 4.3 1,833  HGB 11.2 12.4  HCT 32.9* 36.7  MCV 93 94.6  PLT 243 239   Lipid Panel: Recent Labs    10/16/16 0001 04/09/17 1145  CHOL 205* 211*  HDL 116 142  LDLCALC 80 60  TRIG 43 46  CHOLHDL 1.8 1.5   Lab Results  Component Value Date   HGBA1C 5.9 (H) 04/09/2017    Procedures since last visit: No results found.  Assessment/Plan   ICD-10-CM   1. Bilateral edema of lower extremity R60.0 BMP with eGFR([facility])    ALT    DME Other see comment  2. Insomnia, unspecified type G47.00   3. Anemia, chronic disease D63.8 CBC with Differential/Platelets    Ferritin    Iron  4. Type 2 diabetes mellitus without complication, without long-term current use of insulin (HCC) E11.9 BMP with eGFR([facility])    ALT    Hemoglobin A1c  5. HYPERTENSION, BENIGN SYSTEMIC I10   6. Hyperlipidemia LDL goal <100  E78.5 Lipid Panel    Continue current medications as ordered  May continue OTC topical fungal agent for toes  May continue lavender oil and Non Aspirin med for sleep  Will call with lab results  Follow up with specialists as scheduled  Wear compression stockings ON for 12 hrs and OFF for 12 hrs  Follow up in 6  mos for fasting CPE/ECG/MMSE   Michale Emmerich S. Perlie Gold  [facility] [address] [place], [place] [address] [phone] Cell (Monday-Friday 8 AM - 5 PM) [phone] After 5 PM and follow prompts

## 2017-10-16 LAB — BASIC METABOLIC PANEL WITH GFR
BUN: 20 mg/dL (ref 7–25)
CALCIUM: 9.5 mg/dL (ref 8.6–10.4)
CO2: 25 mmol/L (ref 20–32)
Chloride: 105 mmol/L (ref 98–110)
Creat: 0.89 mg/dL (ref 0.50–0.99)
GFR, EST AFRICAN AMERICAN: 77 mL/min/{1.73_m2} (ref >=60)
GFR, EST NON AFRICAN AMERICAN: 67 mL/min/{1.73_m2} (ref >=60)
Glucose, Bld: 74 mg/dL (ref 65–99)
Potassium: 3.8 mmol/L (ref 3.5–5.3)
Sodium: 140 mmol/L (ref 135–146)

## 2017-10-16 LAB — CBC WITH DIFFERENTIAL/PLATELET
BASOS PCT: 0.3 %
Basophils Absolute: 10 cells/uL (ref 0–200)
Eosinophils Absolute: 69 cells/uL (ref 15–500)
Eosinophils Relative: 2.1 %
HCT: 31.9 % — ABNORMAL LOW (ref 35.0–45.0)
HEMOGLOBIN: 11.1 g/dL — AB (ref 11.7–15.5)
Lymphs Abs: 1383 cells/uL (ref 850–3900)
MCH: 31.7 pg (ref 27.0–33.0)
MCHC: 34.8 g/dL (ref 32.0–36.0)
MCV: 91.1 fL (ref 80.0–100.0)
MPV: 10.4 fL (ref 7.5–12.5)
Monocytes Relative: 9.3 %
NEUTROS ABS: 1531 {cells}/uL (ref 1500–7800)
Neutrophils Relative %: 46.4 %
Platelets: 222 10*3/uL (ref 140–400)
RBC: 3.5 10*6/uL — ABNORMAL LOW (ref 3.80–5.10)
RDW: 13.1 % (ref 11.0–15.0)
Total Lymphocyte: 41.9 %
WBC: 3.3 10*3/uL — ABNORMAL LOW (ref 3.8–10.8)
WBCMIX: 307 {cells}/uL (ref 200–950)

## 2017-10-16 LAB — LIPID PANEL
Cholesterol: 194 mg/dL (ref <200)
HDL: 108 mg/dL (ref >50)
LDL CHOLESTEROL (CALC): 72 mg/dL
NON-HDL CHOLESTEROL (CALC): 86 mg/dL (ref <130)
TRIGLYCERIDES: 48 mg/dL (ref <150)
Total CHOL/HDL Ratio: 1.8 (calc) (ref <5.0)

## 2017-10-16 LAB — HEMOGLOBIN A1C
Hgb A1c MFr Bld: 6 % of total Hgb — ABNORMAL HIGH (ref <5.7)
MEAN PLASMA GLUCOSE: 126 (calc)
eAG (mmol/L): 7 (calc)

## 2017-10-16 LAB — IRON: Iron: 58 ug/dL (ref 45–160)

## 2017-10-16 LAB — ALT: ALT: 18 U/L (ref 6–29)

## 2017-10-16 LAB — FERRITIN: Ferritin: 51 ng/mL (ref 20–288)

## 2017-11-15 ENCOUNTER — Other Ambulatory Visit: Payer: Self-pay | Admitting: Internal Medicine

## 2017-11-15 DIAGNOSIS — Z1231 Encounter for screening mammogram for malignant neoplasm of breast: Secondary | ICD-10-CM

## 2017-11-23 ENCOUNTER — Other Ambulatory Visit: Payer: Self-pay | Admitting: Internal Medicine

## 2017-11-23 DIAGNOSIS — M1990 Unspecified osteoarthritis, unspecified site: Secondary | ICD-10-CM

## 2017-11-24 NOTE — Telephone Encounter (Signed)
A medication refill was received from pharmacy for tramadol 50 mg. Rx was called in to pharmacy after verifying last fill date, provider, and quantity on PMP [place] database.   

## 2017-12-24 ENCOUNTER — Ambulatory Visit
Admission: RE | Admit: 2017-12-24 | Discharge: 2017-12-24 | Disposition: A | Discharge status: Home / Self Care | Payer: PRIVATE HEALTH INSURANCE | Source: Ambulatory Visit | Attending: Internal Medicine | Admitting: Internal Medicine

## 2017-12-24 DIAGNOSIS — Z1231 Encounter for screening mammogram for malignant neoplasm of breast: Secondary | ICD-10-CM

## 2018-01-03 ENCOUNTER — Encounter: Payer: Self-pay | Admitting: Internal Medicine

## 2018-01-03 ENCOUNTER — Ambulatory Visit (INDEPENDENT_AMBULATORY_CARE_PROVIDER_SITE_OTHER): Payer: BLUE CROSS/BLUE SHIELD | Admitting: Internal Medicine

## 2018-01-03 VITALS — BP 120/70 | HR 72 | Ht 66.0 in | Wt 174.0 lb

## 2018-01-03 DIAGNOSIS — I483 Typical atrial flutter: Secondary | ICD-10-CM

## 2018-01-03 DIAGNOSIS — I429 Cardiomyopathy, unspecified: Secondary | ICD-10-CM

## 2018-01-03 DIAGNOSIS — I480 Paroxysmal atrial fibrillation: Secondary | ICD-10-CM

## 2018-01-03 MED ORDER — LOSARTAN POTASSIUM 100 MG PO TABS: ORAL_TABLET | ORAL | 3 refills | Status: DC

## 2018-01-03 MED ORDER — AMLODIPINE BESYLATE 10 MG PO TABS: 5.0000 mg | ORAL_TABLET | Freq: Every day | ORAL | 3 refills | Status: DC

## 2018-01-03 NOTE — Patient Instructions (Addendum)
Medication Instructions:  Your physician has recommended you make the following change in your medication:   1. Decrease your Amlodipine to 5mg , half tablet, once daily 2. Increase your Losartan to 100mg , two tablets, once daily.  Labwork: Your physician recommends that you return for lab work in 2 weeks for a BMP and HgA1C  Testing/Procedures: None ordered.  Follow-Up:  Your physician wants you to follow-up in: One Year with Dr Caryl Comes.  You will receive a reminder letter in the mail two months in advance. If you don't receive a letter, please call our office to schedule the follow-up appointment. Any Other Special Instructions Will Be Listed Below (If Applicable).     If you need a refill on your cardiac medications before your next appointment, please call your pharmacy.

## 2018-01-03 NOTE — Progress Notes (Signed)
He     Patient Care Team: Gildardo Cranker, DO as PCP - General (Internal Medicine)   HPI  [name] is a 67 y.o. female Seen in followup for a diagnosis of atrial flutter/fibrillation in the context of a cardiomyopathy and a prior TIA.  She also has hypertension  She takes apixaban.     Catheterization 2001 demonstrated ejection fraction of 40% ; repeat assessment in 2006 by nuclear study had an ejection fraction of 48% By 2009 ejection fraction had normalized by echocardiogram.   Echo 6/14   Echo >> normal EF   Date Cr K  2/19 0.89 3.8        The patient denies chest pain, shortness of breath, nocturnal dyspnea, orthopnea; she has chronic edema There have been no palpitations, lightheadedness or syncope.            Past Medical History:  Diagnosis Date  . Anxiety   . Arthritis    osteoarthritis  . Breast cancer ([place]) 2008   right, ER/PR -  . CHF (congestive heart failure) ([place])   . Depression   . Diabetes mellitus    TYPE 2  . Gastroparesis   . Hx of radiation therapy 04/04/07 to 05/20/07   right breast/6260 cGy  . Hypercholesterolemia   . Hypertension   . Lumbago   . Malignant neoplasm of breast (female), unspecified site   . Nonspecific elevation of levels of transaminase or lactic acid dehydrogenase (LDH)   . Osteoarthrosis, unspecified whether generalized or localized, unspecified site   . Regional enteritis of unspecified site   . Restless legs syndrome (RLS)   . TIA (transient ischemic attack)   . Urinary frequency   . Uterine prolapse without mention of vaginal wall prolapse   . Vitamin deficiency     Past Surgical History:  Procedure Laterality Date  . ABDOMINAL HYSTERECTOMY  1997   TAH/BSO, ENDOMETRIAL SARCOMA  . ARTHROPLASTY     left knee  . BREAST LUMPECTOMY Right 11/17/06   re-excision 01/13/07  . RADICAL HYSTERECTOMY  1997  . TOTAL KNEE ARTHROPLASTY Left 06/10/2011      . TOTAL KNEE ARTHROPLASTY Right 2006, 2009   Dr Para March  .  TOTAL KNEE ARTHROPLASTY Left 2008   Dr Para March  . WRIST SURGERY     carpel tunnel    Current Outpatient Medications  Medication Sig Dispense Refill  . amLODipine (NORVASC) 10 MG tablet Take 0.5 tablets (5 mg total) by mouth daily. 45 tablet 3  . atorvastatin (LIPITOR) 40 MG tablet TAKE 1 TABLET ONCE DAILY   FOR CHOLESTEROL 90 tablet 2  . Blood Glucose Monitoring Suppl (ONE TOUCH TEST STRIP HOLDER) MISC as directed.      . carvedilol (COREG) 25 MG tablet TAKE 1 TABLET TWICE DAILY  WITH MEALS 180 tablet 3  . cloNIDine (CATAPRES) 0.1 MG tablet Take 1 tablet (0.1 mg total) by mouth daily as needed. Only if blood pressure is greater than 160/100 30 tablet 1  . clotrimazole-betamethasone (LOTRISONE) cream Apply 1 application topically 2 (two) times daily. 30 g 1  . ELIQUIS 5 MG TABS tablet TAKE 1 TABLET BY MOUTH TWICE A DAY 180 tablet 1  . EPINEPHrine (EPIPEN) 0.3 mg/0.3 mL DEVI Inject 0.3 mLs (0.3 mg total) into the muscle once. 1 Device 0  . Ferrous Sulfate (IRON) 325 (65 Fe) MG TABS Take by mouth daily.    [name] Kitchen losartan (COZAAR) 100 MG tablet TAKE 1 TABLET ONCE DAILY TOCONTROL BLOOD PRESSURE 90 tablet  3  . metFORMIN (GLUCOPHAGE) 1000 MG tablet TAKE 1 TABLET TWICE A DAY  WITH MEALS 180 tablet 2  . multivitamin (THERAGRAN) per tablet Take 1 tablet by mouth daily. 30 tablet 11  . traMADol (ULTRAM) 50 MG tablet TAKE 1 TABLET TWICE A DAY  AS NEEDED 60 tablet 0  . glucose blood (ONE TOUCH TEST STRIPS) test strip Use as instructed- supply enough for twice daily testing. 100 each 12   No current facility-administered medications for this visit.     Allergies  Allergen Reactions  . Hydrocodone Nausea And Vomiting  . Lisinopril     Review of Systems negative except from HPI and PMH  Physical Exam BP 120/70   Pulse 72   Ht 5\' 6"  (1.676 m)   Wt 174 lb (78.9 kg)   SpO2 99%   BMI 28.08 kg/m  Well developed and nourished in no acute distress HENT normal Neck supple with JVP-flat Clear Regular  rate and rhythm, no murmurs or gallops Abd-soft with active BS No Clubbing cyanosis 1+ edema Skin-warm and dry A & Oriented  Grossly normal sensory and motor function     Assessment and  Plan  Hypertension  Afib    TIA  Edema   Fatigue //Dizziness acute     BP well controlled  With edema, which may be 2/2 amlodipine, will decrease it and increase losartan in its stead.  Will check BMET when she sees PCP and further BP modifications( ie get rid of amlodipine and maybe try hydralazine or increase clonidine) might be in order  On Anticoagulation;  No bleeding issues   We spent more than 50% of our >25 min visit in face to face counseling regarding the above

## 2018-01-14 ENCOUNTER — Other Ambulatory Visit: Payer: BLUE CROSS/BLUE SHIELD | Admitting: *Deleted

## 2018-01-14 DIAGNOSIS — I429 Cardiomyopathy, unspecified: Secondary | ICD-10-CM

## 2018-01-14 DIAGNOSIS — I480 Paroxysmal atrial fibrillation: Secondary | ICD-10-CM

## 2018-01-14 DIAGNOSIS — I483 Typical atrial flutter: Secondary | ICD-10-CM

## 2018-01-15 LAB — BASIC METABOLIC PANEL
BUN / CREAT RATIO: 21 (ref 12–28)
BUN: 25 mg/dL (ref 8–27)
CHLORIDE: 106 mmol/L (ref 96–106)
CO2: 20 mmol/L (ref 20–29)
CREATININE: 1.17 mg/dL — AB (ref 0.57–1.00)
Calcium: 9 mg/dL (ref 8.7–10.3)
GFR calc Af Amer: 55 mL/min/{1.73_m2} — ABNORMAL LOW (ref >59)
GFR calc non Af Amer: 48 mL/min/{1.73_m2} — ABNORMAL LOW (ref >59)
GLUCOSE: 196 mg/dL — AB (ref 65–99)
Potassium: 3.7 mmol/L (ref 3.5–5.2)
Sodium: 140 mmol/L (ref 134–144)

## 2018-01-15 LAB — HEMOGLOBIN A1C
ESTIMATED AVERAGE GLUCOSE: 140 mg/dL
HEMOGLOBIN A1C: 6.5 % — AB (ref 4.8–5.6)

## 2018-01-20 NOTE — Progress Notes (Signed)
This encounter was created in error - please disregard.

## 2018-01-22 ENCOUNTER — Other Ambulatory Visit: Payer: Self-pay | Admitting: Internal Medicine

## 2018-01-22 DIAGNOSIS — M1990 Unspecified osteoarthritis, unspecified site: Secondary | ICD-10-CM

## 2018-01-25 ENCOUNTER — Telehealth: Payer: Self-pay | Admitting: Internal Medicine

## 2018-01-25 ENCOUNTER — Telehealth: Payer: Self-pay

## 2018-01-25 NOTE — Telephone Encounter (Signed)
-----   Message from Deboraha Sprang, MD sent at 01/24/2018  4:36 PM EDT ----- Please Inform Patient that labs show modest change in renal function and HgbA!c elevated  She should followup about these with her zPCP Thanks

## 2018-01-25 NOTE — Telephone Encounter (Signed)
lmtcb for lab results

## 2018-01-25 NOTE — Telephone Encounter (Signed)
Follow up  ° ° °Patient is returning call in reference to lab results.  °

## 2018-01-26 NOTE — Telephone Encounter (Signed)
Pt aware of lab results and will follow up with her PCP regarding A1C

## 2018-02-24 ENCOUNTER — Other Ambulatory Visit: Payer: Self-pay | Admitting: Pharmacist

## 2018-02-24 MED ORDER — APIXABAN 5 MG PO TABS: 5.0000 mg | ORAL_TABLET | Freq: Two times a day (BID) | ORAL | 1 refills | Status: DC

## 2018-03-21 ENCOUNTER — Telehealth: Payer: Self-pay | Admitting: Internal Medicine

## 2018-03-21 MED ORDER — LOSARTAN POTASSIUM 100 MG PO TABS: ORAL_TABLET | ORAL | 2 refills | Status: DC

## 2018-03-21 NOTE — Telephone Encounter (Signed)
New message    *STAT* If patient is at the pharmacy, call can be transferred to refill team.   1. Which medications need to be refilled? (please list name of each medication and dose if known) Losartan 100 mg  2. Which pharmacy/location (including street and city if local pharmacy) is medication to be sent to? [facility] on [address]  3. Do they need a 30 day or 90 day supply? 90 day

## 2018-03-21 NOTE — Telephone Encounter (Signed)
Pt's medication was sent to pt's pharmacy as requested. Confirmation received.  °

## 2018-03-23 ENCOUNTER — Telehealth: Payer: Self-pay | Admitting: *Deleted

## 2018-03-23 NOTE — Telephone Encounter (Signed)
LMOM  To inform patient that her handicap application has been completed and left at the front desk.

## 2018-04-27 ENCOUNTER — Encounter: Payer: Self-pay | Admitting: Internal Medicine

## 2018-04-28 LAB — HM DIABETES EYE EXAM

## 2018-04-29 ENCOUNTER — Encounter: Payer: Self-pay | Admitting: Internal Medicine

## 2018-04-29 ENCOUNTER — Ambulatory Visit (INDEPENDENT_AMBULATORY_CARE_PROVIDER_SITE_OTHER): Payer: BLUE CROSS/BLUE SHIELD | Admitting: Internal Medicine

## 2018-04-29 ENCOUNTER — Other Ambulatory Visit: Payer: Self-pay | Admitting: Internal Medicine

## 2018-04-29 ENCOUNTER — Ambulatory Visit
Admission: RE | Admit: 2018-04-29 | Discharge: 2018-04-29 | Disposition: A | Discharge status: Home / Self Care | Payer: BLUE CROSS/BLUE SHIELD | Source: Ambulatory Visit | Attending: Internal Medicine | Admitting: Internal Medicine

## 2018-04-29 VITALS — BP 138/80 | HR 68 | Temp 97.6°F | Ht 66.0 in | Wt 174.0 lb

## 2018-04-29 DIAGNOSIS — H6121 Impacted cerumen, right ear: Secondary | ICD-10-CM

## 2018-04-29 DIAGNOSIS — L989 Disorder of the skin and subcutaneous tissue, unspecified: Secondary | ICD-10-CM

## 2018-04-29 DIAGNOSIS — E119 Type 2 diabetes mellitus without complications: Secondary | ICD-10-CM | POA: Diagnosis not present

## 2018-04-29 DIAGNOSIS — M25551 Pain in right hip: Secondary | ICD-10-CM

## 2018-04-29 DIAGNOSIS — I10 Essential (primary) hypertension: Secondary | ICD-10-CM

## 2018-04-29 DIAGNOSIS — R600 Localized edema: Secondary | ICD-10-CM

## 2018-04-29 DIAGNOSIS — E1159 Type 2 diabetes mellitus with other circulatory complications: Secondary | ICD-10-CM

## 2018-04-29 DIAGNOSIS — Z0000 Encounter for general adult medical examination without abnormal findings: Secondary | ICD-10-CM

## 2018-04-29 DIAGNOSIS — G2581 Restless legs syndrome: Secondary | ICD-10-CM

## 2018-04-29 DIAGNOSIS — R1031 Right lower quadrant pain: Secondary | ICD-10-CM

## 2018-04-29 DIAGNOSIS — R208 Other disturbances of skin sensation: Secondary | ICD-10-CM

## 2018-04-29 DIAGNOSIS — E785 Hyperlipidemia, unspecified: Secondary | ICD-10-CM

## 2018-04-29 DIAGNOSIS — R200 Anesthesia of skin: Secondary | ICD-10-CM

## 2018-04-29 DIAGNOSIS — E1169 Type 2 diabetes mellitus with other specified complication: Secondary | ICD-10-CM

## 2018-04-29 DIAGNOSIS — D638 Anemia in other chronic diseases classified elsewhere: Secondary | ICD-10-CM | POA: Diagnosis not present

## 2018-04-29 DIAGNOSIS — I152 Hypertension secondary to endocrine disorders: Secondary | ICD-10-CM

## 2018-04-29 MED ORDER — ROPINIROLE HCL 0.25 MG PO TABS: 0.5000 mg | ORAL_TABLET | Freq: Every day | ORAL | 1 refills | Status: DC

## 2018-04-29 NOTE — Progress Notes (Signed)
Patient ID: [name], female   DOB: 1950-11-23, 67 y.o.   MRN: [id number]   Location:  [facility]  Place of Service:  OFFICE  Provider: Arletha Grippe, DO  Patient Care Team: Gildardo Cranker, DO as PCP - General (Internal Medicine) Katherine Mantle, OD [facility])  Extended Emergency Contact Information Primary Emergency Contact: [name] Address: [address]          [place], [place] [address] [name] of [place] Phone: [phone] Relation: Daughter  Code Status: FULL CODE Goals of Care: Advanced Directive information Advanced Directives 10/15/2017  Does Patient Have a Medical Advance Directive? No  Would patient like information on creating a medical advance directive? No - Patient declined     Chief Complaint  Patient presents with  . Annual Exam    CPE  . MMSE    29/30 passed clock    HPI: Patient is a 67 y.o. female seen in today for an annual wellness exam.  MMSE 29/30. She has numbness in her dorsal left foot since multiple toe fx.   Insomnia - She has difficulty sleeping and staying asleep. Tried melatonin in the past but was ineffective. She has not tried [place]. Benadryl not helping. Sonata ineffective. She is taking non-ASA PM and lavender which helps.  DM - controlled; A1c 6.5%. Takes metformin. Occasional low BS reactions. Urine micro/Cr ratio 50; LDL 72. She does not check BS at home as she does not have a working glucometer  HTN/hx CHF/atrial flutter - BP stable on amlodipine, clonidine, HCTZ, losartan, coreg. BP at home 130s/70s. Takes eliquis for anticoagulation. Hgb 11.1. Followed by cardiology Dr Caryl Comes  Anemia - likely 2/2 CKD; she has iron deficiency as well. Iron 58; ferritin 51; Hgb 11.1.  Hyperlipidemia - LDL 60 on lipitor  Arthritis/LBP - stable on prn tramadol.   Claustrophobia -stable  CKD - stable. Stage 2. Cr 1.17  Hx breast CA - tx with lumpectomy and XRT. No recurrence. She is UTD on mammograms  RLS - she has  intermittent episodes of restless legs off requip   Depression screen [facility] 2/9 04/29/2018 10/15/2017 04/09/2017 04/10/2016 09/11/2014  Decreased Interest 0 0 0 0 -  Down, Depressed, Hopeless 0 0 0 0 2  PHQ - 2 Score 0 0 0 0 2  Altered sleeping - - - - 1  Tired, decreased energy - - - - 2  Change in appetite - - - - 2  Feeling bad or failure about yourself  - - - - 0  Trouble concentrating - - - - 0  Moving slowly or fidgety/restless - - - - 0  Suicidal thoughts - - - - 0  PHQ-9 Score - - - - 7    Fall Risk  04/29/2018 10/15/2017 04/09/2017 10/16/2016 04/10/2016  Falls in the past year? No No No No No  Number falls in past yr: - - - - -  Injury with Fall? - - - - -   MMSE - [place] Exam 04/29/2018 04/09/2017 04/10/2016 04/05/2015  Orientation to time '5 5 5 5  '$ Orientation to Place '5 5 5 5  '$ Registration '3 3 3 3  '$ Attention/ Calculation '5 4 5 4  '$ Recall '3 3 2 2  '$ Language- name 2 objects '2 2 2 2  '$ Language- repeat '1 1 1 1  '$ Language- follow 3 step command '3 3 3 3  '$ Language- read & follow direction '1 1 1 1  '$ Write a sentence 0 1 1 1  Copy design 1 0 0 1  Total score '29 28 28 28     '$ Health Maintenance  Topic Date Due  . Hepatitis C Screening  09-26-1950  . PNA vac Low Risk Adult (2 of 2 - PPSV23) 03/08/2015  . INFLUENZA VACCINE  04/07/2018  . FOOT EXAM  04/09/2018  . HEMOGLOBIN A1C  07/17/2018  . OPHTHALMOLOGY EXAM  04/29/2019  . MAMMOGRAM  12/25/2019  . TETANUS/TDAP  11/30/2023  . COLONOSCOPY  01/17/2025  . DEXA SCAN  Completed    Urinary incontinence? No issues  Functional Status Survey: Is the patient deaf or have difficulty hearing?: No Does the patient have difficulty seeing, even when wearing glasses/contacts?: No Does the patient have difficulty concentrating, remembering, or making decisions?: No Does the patient have difficulty walking or climbing stairs?: No Does the patient have difficulty dressing or bathing?: No Does the patient have difficulty doing errands alone such as  visiting a doctor's office or shopping?: No  Exercise? As tolerated - she does walk on a regular basis  Diet? Maintains healthy food choices  No exam data present  Hearing: no issues    Dentition: followed by dentist  Pain: controlled on prn tramadol  Past Medical History:  Diagnosis Date  . Anxiety   . Arthritis    osteoarthritis  . Breast cancer ([place]) 2008   right, ER/PR -  . CHF (congestive heart failure) ([place])   . Depression   . Diabetes mellitus    TYPE 2  . Gastroparesis   . Hx of radiation therapy 04/04/07 to 05/20/07   right breast/6260 cGy  . Hypercholesterolemia   . Hypertension   . Lumbago   . Malignant neoplasm of breast (female), unspecified site   . Nonspecific elevation of levels of transaminase or lactic acid dehydrogenase (LDH)   . Osteoarthrosis, unspecified whether generalized or localized, unspecified site   . Regional enteritis of unspecified site   . Restless legs syndrome (RLS)   . TIA (transient ischemic attack)   . Urinary frequency   . Uterine prolapse without mention of vaginal wall prolapse   . Vitamin deficiency     Past Surgical History:  Procedure Laterality Date  . ABDOMINAL HYSTERECTOMY  1997   TAH/BSO, ENDOMETRIAL SARCOMA  . ARTHROPLASTY     left knee  . BREAST LUMPECTOMY Right 11/17/06   re-excision 01/13/07  . RADICAL HYSTERECTOMY  1997  . TOTAL KNEE ARTHROPLASTY Left 06/10/2011      . TOTAL KNEE ARTHROPLASTY Right 2006, 2009   Dr Para March  . TOTAL KNEE ARTHROPLASTY Left 2008   Dr Para March  . WRIST SURGERY     carpel tunnel    Family History  Problem Relation Age of Onset  . Diabetes Mother   . Diabetes Father   . Aneurysm Sister   . Arthritis Sister   . Diabetes Brother   . Heart Problems Brother   . Colon cancer Neg Hx    Family Status  Relation Name Status  . Mother  Alive  . Father  Deceased  . Sister [name] Deceased  . Brother [name] Deceased  . Daughter [facility] ANN Alive  . Son [facility] Alive  . Sister  BEATRICE Deceased       Cause of Death: Aneurysm  . Sister LEE Broxton  . Brother [employer]  . Brother [employer]  . Brother [employer]  . Son [place]  . Son BOBBY Alive  . [employer]GM  Deceased  . MGF  Deceased  . PGM  Deceased  . PGF  Deceased  . Daughter [employer]  . Neg Hx  (Not Specified)    Social History   Socioeconomic History  . Marital status: Widowed    Spouse name: Not on file  . Number of children: 4  . Years of education: Not on file  . Highest education level: Not on file  Occupational History  . Occupation: [employer]  Social Needs  . Financial resource strain: Not on file  . Food insecurity:    Worry: Not on file    Inability: Not on file  . Transportation needs:    Medical: Not on file    Non-medical: Not on file  Tobacco Use  . Smoking status: Former Smoker    Packs/day: 0.50    Years: 20.00    Pack years: 10.00    Last attempt to quit: 09/08/1991    Years since quitting: 26.6  . Smokeless tobacco: Never Used  Substance and Sexual Activity  . Alcohol use: No  . Drug use: No  . Sexual activity: Never  Lifestyle  . Physical activity:    Days per week: Not on file    Minutes per session: Not on file  . Stress: Not on file  Relationships  . Social connections:    Talks on phone: Not on file    Gets together: Not on file    Attends religious service: Not on file    Active member of club or organization: Not on file    Attends meetings of clubs or organizations: Not on file    Relationship status: Not on file  . Intimate partner violence:    Fear of current or ex partner: Not on file    Emotionally abused: Not on file    Physically abused: Not on file    Forced sexual activity: Not on file  Other Topics Concern  . Not on file  Social History Narrative   Widowed   Lives with daughter   Former smoker-stopped 1993   Alcohol none   Exercise 3 days a week [facility] [employer]     Allergies  Allergen Reactions  .  Hydrocodone Nausea And Vomiting  . Lisinopril     Allergies as of 04/29/2018      Reactions   Hydrocodone Nausea And Vomiting   Lisinopril       Medication List        Accurate as of 04/29/18 11:20 AM. Always use your most recent med list.          amLODipine 10 MG tablet Commonly known as:  NORVASC Take 0.5 tablets (5 mg total) by mouth daily.   apixaban 5 MG Tabs tablet Commonly known as:  ELIQUIS Take 1 tablet (5 mg total) by mouth 2 (two) times daily.   atorvastatin 40 MG tablet Commonly known as:  LIPITOR TAKE 1 TABLET ONCE DAILY   FOR CHOLESTEROL   carvedilol 25 MG tablet Commonly known as:  COREG TAKE 1 TABLET TWICE DAILY  WITH MEALS   Iron 325 (65 Fe) MG Tabs Take by mouth daily.   losartan 100 MG tablet Commonly known as:  COZAAR TAKE 1 TABLET ONCE DAILY TOCONTROL BLOOD PRESSURE   metFORMIN 1000 MG tablet Commonly known as:  GLUCOPHAGE TAKE 1 TABLET TWICE A DAY  WITH MEALS   rOPINIRole 0.25 MG tablet Commonly known as:  REQUIP Take 2 tablets (0.5 mg total) by mouth at bedtime. For restless legs  traMADol 50 MG tablet Commonly known as:  ULTRAM TAKE 1 TABLET BY MOUTH TWICE A DAY AS NEEDED        Review of Systems:  Review of Systems  Gastrointestinal: Positive for abdominal pain.  Musculoskeletal: Positive for arthralgias.  Psychiatric/Behavioral: Positive for sleep disturbance.  All other systems reviewed and are negative.   Physical Exam: Vitals:   04/29/18 0940  BP: 138/80  Pulse: 68  Temp: 97.6 F (36.4 C)  TempSrc: Oral  SpO2: 99%  Weight: 174 lb (78.9 kg)  Height: '5\' 6"'$  (1.676 m)   Body mass index is 28.08 kg/m. Physical Exam  Constitutional: She is oriented to person, place, and time. She appears well-developed and well-nourished. No distress.  HENT:  Head: Normocephalic and atraumatic.  Right Ear: Tympanic membrane, external ear and ear canal normal. Decreased hearing is noted.  Left Ear: Hearing, tympanic membrane,  external ear and ear canal normal.  Nose:    Mouth/Throat: Uvula is midline, oropharynx is clear and moist and mucous membranes are normal. She does not have dentures.  Right cerumen impaction and unable to visualize TM  Eyes: Pupils are equal, round, and reactive to light. Conjunctivae, EOM and lids are normal. No scleral icterus.  Neck: Trachea normal and normal range of motion. Neck supple. Carotid bruit is not present. No thyroid mass and no thyromegaly present.  Cardiovascular: Normal rate, regular rhythm and intact distal pulses. Exam reveals no gallop and no friction rub.  Murmur (1/6 SEM) heard. No LE edema b/l. No calf TTP.   Pulmonary/Chest: Effort normal and breath sounds normal. She has no wheezes. She has no rhonchi. She has no rales. She exhibits no mass. Right breast exhibits skin change. Right breast exhibits no inverted nipple, no mass, no nipple discharge and no tenderness. Left breast exhibits no inverted nipple, no mass, no nipple discharge, no skin change and no tenderness. Breasts are asymmetrical.    Abdominal: Soft. Normal appearance, normal aorta and bowel sounds are normal. She exhibits no pulsatile midline mass and no mass. There is no hepatosplenomegaly. There is tenderness in the right lower quadrant. There is no rigidity, no rebound and no guarding. No hernia.  Musculoskeletal: Normal range of motion. She exhibits edema (small and large joints) and tenderness. She exhibits no deformity.  No short leg; right ASIS TTP; right SI joint restriction; reduced right hip flexion; no greater trochanteric TTP  Lymphadenopathy:       Head (right side): No posterior auricular adenopathy present.       Head (left side): No posterior auricular adenopathy present.    She has no cervical adenopathy.       Right: No supraclavicular adenopathy present.       Left: No supraclavicular adenopathy present.  Neurological: She is alert and oriented to person, place, and time. She has  normal strength and normal reflexes. No cranial nerve deficit. Gait normal.  Skin: Skin is warm, dry and intact. No rash noted. Nails show no clubbing.  Psychiatric: She has a normal mood and affect. Her speech is normal and behavior is normal. Judgment and thought content normal. Cognition and memory are normal.   Diabetic Foot Exam - Simple   Simple Foot Form Diabetic Foot exam was performed with the following findings:  Yes 04/29/2018 11:22 AM  Visual Inspection See comments:  Yes Sensation Testing Intact to touch and monofilament testing bilaterally:  Yes Pulse Check Posterior Tibialis and Dorsalis pulse intact bilaterally:  Yes Comments Toenails thick b/l; bunions  noted     Labs reviewed:  Basic Metabolic Panel: Recent Labs    10/15/17 1007 01/14/18 0810  NA 140 140  K 3.8 3.7  CL 105 106  CO2 25 20  GLUCOSE 74 196*  BUN 20 25  CREATININE 0.89 1.17*  CALCIUM 9.5 9.0   Liver Function Tests: Recent Labs    10/15/17 1007  ALT 18   No results for input(s): LIPASE, AMYLASE in the last 8760 hours. No results for input(s): AMMONIA in the last 8760 hours. CBC: Recent Labs    10/15/17 1007  WBC 3.3*  NEUTROABS 1,531  HGB 11.1*  HCT 31.9*  MCV 91.1  PLT 222   Lipid Panel: Recent Labs    10/15/17 1007  CHOL 194  HDL 108  LDLCALC 72  TRIG 48  CHOLHDL 1.8   Lab Results  Component Value Date   HGBA1C 6.5 (H) 01/14/2018    Procedures: No results found.  Assessment/Plan   ICD-10-CM   1. Well adult exam Z00.00   2. Type 2 diabetes mellitus without complication, without long-term current use of insulin (HCC) E11.9 CBC with Differential/Platelets    CMP with eGFR([facility])    Microalbumin/Creatinine Ratio, Urine    Urinalysis with Reflex Microscopic    Hemoglobin A1c  3. Hyperlipidemia associated with type 2 diabetes mellitus (HCC) E11.69 Lipid Panel   E78.5 TSH  4. Anemia, chronic disease D63.8 CBC with Differential/Platelets    Iron    Ferritin  5.  Bilateral edema of lower extremity R60.0   6. Hypertension associated with diabetes ([place]) E11.59 CBC with Differential/Platelets   I10   7. Colicky right lower quadrant pain R10.31 CT Abdomen Pelvis Wo Contrast  8. Right hip pain M25.551 DG HIP UNILAT W OR W/O PELVIS MIN 4 VIEWS RIGHT  9. Right lower quadrant abdominal pain R10.31 CT Abdomen Pelvis Wo Contrast  10. Numbness of left foot R20.8   11. Hearing loss of right ear due to cerumen impaction H61.21   12. Restless leg syndrome G25.81 rOPINIRole (REQUIP) 0.25 MG tablet  13. Skin lesion of face L98.9     She will get flu influenza vaccine at another time per pt preference  Ear lavage performed successfully today  Will call with lab results  Will call with xray and imaging appts/results  Continue current medications as ordered  Numbness in foot will eventually get better as nerves heal  T/c dermatology eval for right nare skin lesion - she prefrs to wait at this time  Follow up in 6 mos with Janett Billow for DM, HTN/aflutter, anemia, hyperlipidemia  Keeping You Healthy handout given   Cordella Register. Perlie Gold  [facility] [address] [place], [place] [address] [phone] Cell (Monday-Friday 8 AM - 5 PM) [phone] After 5 PM and follow prompts

## 2018-04-29 NOTE — Patient Instructions (Addendum)
Will call with lab results  Will call with xray and imaging appts/results  Continue current medications as ordered  Numbness in foot will eventually get better as nerves heal  Follow up in 6 mos with Janett Billow for DM, HTN/aflutter, anemia, hyperlipidemia  Keeping You Healthy  Get These Tests  Blood Pressure- Have your blood pressure checked by your healthcare provider at least once a year.  Normal blood pressure is 120/80.  Weight- Have your body mass index (BMI) calculated to screen for obesity.  BMI is a measure of body fat based on height and weight.  You can calculate your own BMI at [employer].it  Cholesterol- Have your cholesterol checked every year.  Diabetes- Have your blood sugar checked every year if you have high blood pressure, high cholesterol, a family history of diabetes or if you are overweight.  Pap Test - Have a pap test every 1 to 5 years if you have been sexually active.  If you are older than 65 and recent pap tests have been normal you may not need additional pap tests.  In addition, if you have had a hysterectomy  for benign disease additional pap tests are not necessary.  Mammogram-Yearly mammograms are essential for early detection of breast cancer  Screening for Colon Cancer- Colonoscopy starting at age 11. Screening may begin sooner depending on your family history and other health conditions.  Follow up colonoscopy as directed by your Gastroenterologist.  Screening for Osteoporosis- Screening begins at age 29 with bone density scanning, sooner if you are at higher risk for developing Osteoporosis.  Get these medicines  Calcium with Vitamin D- Your body requires 1200-1500 mg of Calcium a day and [phone] IU of Vitamin D a day.  You can only absorb 500 mg of Calcium at a time therefore Calcium must be taken in 2 or 3 separate doses throughout the day.  Hormones- Hormone therapy has been associated with increased risk for certain cancers and heart  disease.  Talk to your healthcare provider about if you need relief from menopausal symptoms.  Aspirin- Ask your healthcare provider about taking Aspirin to prevent Heart Disease and Stroke.  Get these Immuniztions  Flu shot- Every fall  Pneumonia shot- Once after the age of 47; if you are younger ask your healthcare provider if you need a pneumonia shot.  Tetanus- Every ten years.  Zostavax- Once after the age of 43 to prevent shingles.  Take these steps  Don't smoke- Your healthcare provider can help you quit. For tips on how to quit, ask your healthcare provider or go to www.smokefree.gov or call 1-800 QUIT-NOW.  Be physically active- Exercise 5 days a week for a minimum of 30 minutes.  If you are not already physically active, start slow and gradually work up to 30 minutes of moderate physical activity.  Try walking, dancing, bike riding, swimming, etc.  Eat a healthy diet- Eat a variety of healthy foods such as fruits, vegetables, whole grains, low fat milk, low fat cheeses, yogurt, lean meats, chicken, fish, eggs, dried beans, tofu, etc.  For more information go to www.thenutritionsource.org  Dental visit- Brush and floss teeth twice daily; visit your dentist twice a year.  Eye exam- Visit your Optometrist or Ophthalmologist yearly.  Drink alcohol in moderation- Limit alcohol intake to one drink or less a day.  Never drink and drive.  Depression- Your emotional health is as important as your physical health.  If you're feeling down or losing interest in things you normally enjoy,  please talk to your healthcare provider.  Seat Belts- can save your life; always wear one  Smoke/Carbon Monoxide detectors- These detectors need to be installed on the appropriate level of your home.  Replace batteries at least once a year.  Violence- If anyone is threatening or hurting you, please tell your healthcare provider.  Living Will/ Health care power of attorney- Discuss with your  healthcare provider and family.

## 2018-04-30 LAB — FERRITIN: FERRITIN: 58 ng/mL (ref 16–288)

## 2018-04-30 LAB — CBC WITH DIFFERENTIAL/PLATELET
Basophils Absolute: 9 cells/uL (ref 0–200)
Basophils Relative: 0.3 %
EOS PCT: 2.3 %
Eosinophils Absolute: 71 cells/uL (ref 15–500)
HEMATOCRIT: 37.2 % (ref 35.0–45.0)
HEMOGLOBIN: 12.8 g/dL (ref 11.7–15.5)
LYMPHS ABS: 1463 {cells}/uL (ref 850–3900)
MCH: 31.6 pg (ref 27.0–33.0)
MCHC: 34.4 g/dL (ref 32.0–36.0)
MCV: 91.9 fL (ref 80.0–100.0)
MPV: 10.8 fL (ref 7.5–12.5)
Monocytes Relative: 9.8 %
NEUTROS ABS: 1252 {cells}/uL — AB (ref 1500–7800)
NEUTROS PCT: 40.4 %
Platelets: 214 10*3/uL (ref 140–400)
RBC: 4.05 10*6/uL (ref 3.80–5.10)
RDW: 13.1 % (ref 11.0–15.0)
Total Lymphocyte: 47.2 %
WBC: 3.1 10*3/uL — ABNORMAL LOW (ref 3.8–10.8)
WBCMIX: 304 {cells}/uL (ref 200–950)

## 2018-04-30 LAB — URINALYSIS, ROUTINE W REFLEX MICROSCOPIC
BILIRUBIN URINE: NEGATIVE
GLUCOSE, UA: NEGATIVE
Hgb urine dipstick: NEGATIVE
Hyaline Cast: NONE SEEN /LPF
Ketones, ur: NEGATIVE
NITRITE: NEGATIVE
PH: 6 (ref 5.0–8.0)
Protein, ur: NEGATIVE
RBC / HPF: NONE SEEN /HPF (ref 0–2)
SPECIFIC GRAVITY, URINE: 1.011 (ref 1.001–1.03)

## 2018-04-30 LAB — LIPID PANEL
Cholesterol: 218 mg/dL — ABNORMAL HIGH (ref <200)
HDL: 93 mg/dL (ref >50)
LDL Cholesterol (Calc): 108 mg/dL (calc) — ABNORMAL HIGH
NON-HDL CHOLESTEROL (CALC): 125 mg/dL (ref <130)
Total CHOL/HDL Ratio: 2.3 (calc) (ref <5.0)
Triglycerides: 84 mg/dL (ref <150)

## 2018-04-30 LAB — MICROALBUMIN / CREATININE URINE RATIO
CREATININE, URINE: 32 mg/dL (ref 20–275)
Microalb Creat Ratio: 9 mcg/mg creat (ref <30)
Microalb, Ur: 0.3 mg/dL

## 2018-04-30 LAB — IRON: IRON: 142 ug/dL (ref 45–160)

## 2018-04-30 LAB — COMPLETE METABOLIC PANEL WITH GFR
AG RATIO: 1.3 (calc) (ref 1.0–2.5)
ALBUMIN MSPROF: 4.4 g/dL (ref 3.6–5.1)
ALKALINE PHOSPHATASE (APISO): 100 U/L (ref 33–130)
ALT: 21 U/L (ref 6–29)
AST: 28 U/L (ref 10–35)
BUN / CREAT RATIO: 18 (calc) (ref 6–22)
BUN: 19 mg/dL (ref 7–25)
CO2: 25 mmol/L (ref 20–32)
Calcium: 9.4 mg/dL (ref 8.6–10.4)
Chloride: 106 mmol/L (ref 98–110)
Creat: 1.06 mg/dL — ABNORMAL HIGH (ref 0.50–0.99)
GFR, EST NON AFRICAN AMERICAN: 54 mL/min/{1.73_m2} — AB (ref >=60)
GFR, Est African American: 62 mL/min/{1.73_m2} (ref >=60)
GLOBULIN: 3.4 g/dL (ref 1.9–3.7)
Glucose, Bld: 92 mg/dL (ref 65–99)
POTASSIUM: 4.4 mmol/L (ref 3.5–5.3)
SODIUM: 140 mmol/L (ref 135–146)
Total Bilirubin: 0.6 mg/dL (ref 0.2–1.2)
Total Protein: 7.8 g/dL (ref 6.1–8.1)

## 2018-04-30 LAB — TSH: TSH: 1.49 m[IU]/L (ref 0.40–4.50)

## 2018-04-30 LAB — HEMOGLOBIN A1C
EAG (MMOL/L): 8.1 (calc)
Hgb A1c MFr Bld: 6.7 % of total Hgb — ABNORMAL HIGH (ref <5.7)
MEAN PLASMA GLUCOSE: 146 (calc)

## 2018-05-02 ENCOUNTER — Other Ambulatory Visit: Payer: Self-pay

## 2018-05-02 DIAGNOSIS — G8929 Other chronic pain: Secondary | ICD-10-CM

## 2018-05-02 DIAGNOSIS — M25551 Pain in right hip: Principal | ICD-10-CM

## 2018-05-03 NOTE — Addendum Note (Signed)
Addended by: Logan Bores on: 05/03/2018 10:58 AM   Modules accepted: Orders

## 2018-05-11 ENCOUNTER — Other Ambulatory Visit: Payer: Self-pay | Admitting: *Deleted

## 2018-05-11 DIAGNOSIS — E119 Type 2 diabetes mellitus without complications: Secondary | ICD-10-CM

## 2018-05-11 MED ORDER — METFORMIN HCL 1000 MG PO TABS: 1000.0000 mg | ORAL_TABLET | Freq: Two times a day (BID) | ORAL | 2 refills | Status: DC

## 2018-05-11 NOTE — Telephone Encounter (Signed)
[facility]

## 2018-05-13 ENCOUNTER — Other Ambulatory Visit: Payer: BLUE CROSS/BLUE SHIELD

## 2018-05-20 ENCOUNTER — Ambulatory Visit (INDEPENDENT_AMBULATORY_CARE_PROVIDER_SITE_OTHER): Payer: Self-pay

## 2018-05-20 ENCOUNTER — Encounter (INDEPENDENT_AMBULATORY_CARE_PROVIDER_SITE_OTHER): Payer: Self-pay | Admitting: Orthopaedic Surgery

## 2018-05-20 ENCOUNTER — Ambulatory Visit (INDEPENDENT_AMBULATORY_CARE_PROVIDER_SITE_OTHER): Payer: BLUE CROSS/BLUE SHIELD | Admitting: Orthopaedic Surgery

## 2018-05-20 VITALS — Ht 66.0 in | Wt 174.0 lb

## 2018-05-20 DIAGNOSIS — M5441 Lumbago with sciatica, right side: Secondary | ICD-10-CM

## 2018-05-20 DIAGNOSIS — M25551 Pain in right hip: Secondary | ICD-10-CM

## 2018-05-20 DIAGNOSIS — G8929 Other chronic pain: Secondary | ICD-10-CM

## 2018-05-20 NOTE — Progress Notes (Signed)
Office Visit Note   Patient: [name]           Date of Birth: [birth year]           MRN: [id number] Visit Date: 05/20/2018              Requested by: Gildardo Cranker, [place], [place] [id number] PCP: Gildardo Cranker, DO   Assessment & Plan: Visit Diagnoses:  1. Pain in right hip   2. Chronic midline low back pain with right-sided sciatica     Plan: Chronic right "hip" pain.  This appears to originate from the lumbar spine.  I do not see any specific pathology about the right hip.  Degenerative changes at L4-5 and L5-S1 are identified by films.  I think a course of physical therapy would be helpful.  Return in 1 month and consider MRI scan if no improvement.  Okay to continue working.  Total office visit over 45 minutes with 50% of the time in counseling and coordination of care.  Hopefully the therapy make a difference but if not we will consider MRI scan.  No change in medicines  Follow-Up Instructions: Return in about 1 month (around 06/19/2018).   Orders:  Orders Placed This Encounter  Procedures  . XR HIP UNILAT W OR W/O PELVIS 2-3 VIEWS RIGHT  . XR Lumbar Spine 2-3 Views  . Ambulatory referral to Physical Therapy   No orders of the defined types were placed in this encounter.     Procedures: No procedures performed   Clinical Data: No additional findings.   Subjective: Chief Complaint  Patient presents with  . New Patient (Initial Visit)    R HIP PAIN 1 YR NO INJURY. DR Eulas Post REF PT HERE NI INJECTIONS  [name] is 67 years old and visited the office for evaluation of a combination of pains were referable to her back and right hip.  She noted onset insidiously of pain many years ago in her "low back" and was told she had a "pinched nerve.  She tried some therapy and it seemed to "make a difference". She recently without injury or trauma has been experiencing pain in her right groin particularly when she lays down at night or when she  gets up from a sitting position.  She does not limp.  She has not had any numbness or tingling.  She denies any thigh or leg pain when she ambulates.  When she is "up and about" pain seems to abate.  She has not had any left lower extremity symptoms.  No related bowel or bladder dysfunction.  She continues to work full-time and would like to remain in that capacity  Past history is significant that she is had bilateral total knee replacements.  Surgery was initially performed by Dr. Noemi Chapel and had revision of both of her knees by Dr. Mayer Camel  HPI  Review of Systems  Constitutional: Negative for fatigue and fever.  HENT: Negative for ear pain.   Eyes: Negative for pain.  Respiratory: Negative for cough and shortness of breath.   Cardiovascular: Positive for leg swelling.  Gastrointestinal: Negative for constipation and diarrhea.  Genitourinary: Negative for difficulty urinating.  Musculoskeletal: Positive for back pain. Negative for neck pain.  Skin: Negative for rash.  Allergic/Immunologic: Negative for food allergies.  Neurological: Positive for weakness. Negative for numbness.  Hematological: Does not bruise/bleed easily.  Psychiatric/Behavioral: Positive for sleep disturbance.     Objective: Vital Signs: Ht 5\' 6"  (  1.676 m)   Wt 174 lb (78.9 kg)   BMI 28.08 kg/m   Physical Exam  Constitutional: She is oriented to person, place, and time. She appears well-developed and well-nourished.  HENT:  Mouth/Throat: Oropharynx is clear and moist.  Eyes: Pupils are equal, round, and reactive to light. EOM are normal.  Pulmonary/Chest: Effort normal.  Neurological: She is alert and oriented to person, place, and time.  Skin: Skin is warm and dry.  Psychiatric: She has a normal mood and affect. Her behavior is normal.    Ortho Exam awake alert and oriented x3.  Comfortable sitting.  Painless range of motion of both hips with internal/external rotation.  Well-healed knee incisions.  No pain  about either knee.  Little bit opening with varus valgus stress but no pain.  Neurologically intact.  Straight leg raise negative.  Some percussible tenderness to lower lumbar spine.  No pain over the sacroiliac joints.  Mild discomfort in the lower lumbar spine after sitting on the examining table. Specialty Comments:  No specialty comments available.  Imaging: Xr Hip Unilat W Or W/o Pelvis 2-3 Views Right  Result Date: 05/20/2018 AP pelvis obtained demonstrating some sclerosis about the right SI joint.  Patient is not symptomatic in that area.  Some mild degenerative changes in both hips but does not walk with a limp and no problem with range of motion  Xr Lumbar Spine 2-3 Views  Result Date: 05/20/2018 2 views of the lumbar spine were obtained AP and lateral.  There is obvious degenerative facet joint changes at L4-5 and L5-S1.  No significant scoliosis significant degenerative disc disease at L4-5 and L5-S1 with disc space narrowing.  Osteophytes identified posteriorly at L4.  No listhesis.    PMFS History: Patient Active Problem List   Diagnosis Date Noted  . Anemia, chronic disease 10/15/2017  . Type 2 diabetes mellitus without complication, without long-term current use of insulin ([place]) 10/15/2017  . Hyperlipidemia associated with type 2 diabetes mellitus ([place]) 10/15/2017  . Claustrophobia 07/05/2015  . Bilateral edema of lower extremity 09/11/2014  . CKD stage 3 due to type 2 diabetes mellitus ([place]) 03/07/2014  . Restless leg syndrome 11/29/2013  . Other and unspecified hyperlipidemia 11/29/2013  . Osteoarthritis 11/29/2013  . CKD (chronic kidney disease) 08/16/2013  . Insomnia 08/16/2013  . Atrial flutter- not confirmed on ECG review 12/15/2012  . Loss of weight 12/15/2012  . Gastroparesis 12/14/2012  . Renal impairment 12/14/2012  . History of malignant neoplasm of right breast 01/01/2012  . Arthritis   . Hx of radiation therapy   . OSTEOARTHRITIS, HANDS, BILATERAL  05/29/2009  . CHEST PAIN, ATYPICAL 02/09/2008  . LOW BACK PAIN, ACUTE 01/03/2007  . Diabetes mellitus due to underlying condition with diabetic nephropathy ([place]) 11/04/2006  . HYPERCHOLESTEROLEMIA 11/04/2006  . HYPERTENSION, BENIGN SYSTEMIC 11/04/2006  . Cardiomyopathy-recovered 11/04/2006  . MENOPAUSAL SYNDROME 11/04/2006  . OSTEOARTHRITIS, LOWER LEG 11/04/2006  . GAS/BLOATING 11/04/2006   Past Medical History:  Diagnosis Date  . Anxiety   . Arthritis    osteoarthritis  . Breast cancer ([place]) 2008   right, ER/PR -  . CHF (congestive heart failure) ([place])   . Depression   . Diabetes mellitus    TYPE 2  . Gastroparesis   . Hx of radiation therapy 04/04/07 to 05/20/07   right breast/6260 cGy  . Hypercholesterolemia   . Hypertension   . Lumbago   . Malignant neoplasm of breast (female), unspecified site   . Nonspecific elevation of  levels of transaminase or lactic acid dehydrogenase (LDH)   . Osteoarthrosis, unspecified whether generalized or localized, unspecified site   . Regional enteritis of unspecified site   . Restless legs syndrome (RLS)   . TIA (transient ischemic attack)   . Urinary frequency   . Uterine prolapse without mention of vaginal wall prolapse   . Vitamin deficiency     Family History  Problem Relation Age of Onset  . Diabetes Mother   . Diabetes Father   . Aneurysm Sister   . Arthritis Sister   . Diabetes Brother   . Heart Problems Brother   . Colon cancer Neg Hx     Past Surgical History:  Procedure Laterality Date  . ABDOMINAL HYSTERECTOMY  1997   TAH/BSO, ENDOMETRIAL SARCOMA  . ARTHROPLASTY     left knee  . BREAST LUMPECTOMY Right 11/17/06   re-excision 01/13/07  . RADICAL HYSTERECTOMY  1997  . TOTAL KNEE ARTHROPLASTY Left 06/10/2011      . TOTAL KNEE ARTHROPLASTY Right 2006, 2009   Dr Stewart March  . TOTAL KNEE ARTHROPLASTY Left 2008   Dr Stewart March  . WRIST SURGERY     carpel tunnel   Social History   Occupational History  . 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 Smoking status: Former Smoker    Packs/day: 0.50    Years: 20.00    Pack years: 10.00    Last attempt to quit: 09/08/1991    Years since quitting: 26.7  . Smokeless tobacco: Never Used  Substance and Sexual Activity  . Alcohol use: No  . Drug use: No  . Sexual activity: Never

## 2018-05-22 ENCOUNTER — Other Ambulatory Visit: Payer: Self-pay | Admitting: Internal Medicine

## 2018-05-22 DIAGNOSIS — M1990 Unspecified osteoarthritis, unspecified site: Secondary | ICD-10-CM

## 2018-05-23 NOTE — Telephone Encounter (Signed)
A medication refill was received from pharmacy for tramadol 50 mg. Rx was called in to pharmacy after verifying last fill date, provider, and quantity on PMP [place] database.   

## 2018-06-12 ENCOUNTER — Other Ambulatory Visit: Payer: Self-pay | Admitting: Internal Medicine

## 2018-06-25 ENCOUNTER — Other Ambulatory Visit: Payer: Self-pay | Admitting: Internal Medicine

## 2018-08-10 ENCOUNTER — Other Ambulatory Visit: Payer: Self-pay | Admitting: *Deleted

## 2018-08-10 DIAGNOSIS — M1990 Unspecified osteoarthritis, unspecified site: Secondary | ICD-10-CM

## 2018-08-10 MED ORDER — TRAMADOL HCL 50 MG PO TABS: 50.0000 mg | ORAL_TABLET | Freq: Two times a day (BID) | ORAL | 0 refills | Status: DC | PRN

## 2018-08-10 NOTE — Telephone Encounter (Signed)
[facility]  [address] 

## 2018-09-09 ENCOUNTER — Other Ambulatory Visit: Payer: Self-pay | Admitting: *Deleted

## 2018-09-09 MED ORDER — APIXABAN 5 MG PO TABS: 5.0000 mg | ORAL_TABLET | Freq: Two times a day (BID) | ORAL | 1 refills | Status: DC

## 2018-09-09 NOTE — Telephone Encounter (Signed)
Pt is a 68 yr old female who saw Dr. Caryl Comes on 01/03/18. Last noted weight on 05/20/18 was 78.9Kg. SCr on 04/29/18 was 1.06, will refill Eliquis 5mg  BID.

## 2018-10-11 ENCOUNTER — Other Ambulatory Visit: Payer: Self-pay | Admitting: Nurse Practitioner

## 2018-10-11 DIAGNOSIS — M1990 Unspecified osteoarthritis, unspecified site: Secondary | ICD-10-CM

## 2018-10-11 NOTE — Telephone Encounter (Signed)
Verified [place] database 08/10/2018 for 60 tabs refilled by Sherrie Mustache, NP. Next appointment 11/04/2018 with Janett Billow.  Last appointment with Dr. Eulas Post on 04/29/2018.

## 2018-10-17 ENCOUNTER — Other Ambulatory Visit: Payer: Self-pay | Admitting: *Deleted

## 2018-10-17 MED ORDER — CARVEDILOL 25 MG PO TABS: 25.0000 mg | ORAL_TABLET | Freq: Two times a day (BID) | ORAL | 0 refills | Status: DC

## 2018-10-17 NOTE — Telephone Encounter (Signed)
[facility]

## 2018-11-04 ENCOUNTER — Encounter: Payer: Self-pay | Admitting: Nurse Practitioner

## 2018-11-04 ENCOUNTER — Ambulatory Visit (INDEPENDENT_AMBULATORY_CARE_PROVIDER_SITE_OTHER): Payer: 59 | Admitting: Nurse Practitioner

## 2018-11-04 VITALS — BP 148/86 | HR 65 | Temp 97.7°F | Ht 66.0 in | Wt 178.0 lb

## 2018-11-04 DIAGNOSIS — R600 Localized edema: Secondary | ICD-10-CM | POA: Diagnosis not present

## 2018-11-04 DIAGNOSIS — Z23 Encounter for immunization: Secondary | ICD-10-CM | POA: Diagnosis not present

## 2018-11-04 DIAGNOSIS — E1169 Type 2 diabetes mellitus with other specified complication: Secondary | ICD-10-CM

## 2018-11-04 DIAGNOSIS — I152 Hypertension secondary to endocrine disorders: Secondary | ICD-10-CM

## 2018-11-04 DIAGNOSIS — D638 Anemia in other chronic diseases classified elsewhere: Secondary | ICD-10-CM

## 2018-11-04 DIAGNOSIS — E785 Hyperlipidemia, unspecified: Secondary | ICD-10-CM

## 2018-11-04 DIAGNOSIS — E2839 Other primary ovarian failure: Secondary | ICD-10-CM | POA: Diagnosis not present

## 2018-11-04 DIAGNOSIS — E119 Type 2 diabetes mellitus without complications: Secondary | ICD-10-CM

## 2018-11-04 DIAGNOSIS — E1159 Type 2 diabetes mellitus with other circulatory complications: Secondary | ICD-10-CM

## 2018-11-04 DIAGNOSIS — I10 Essential (primary) hypertension: Secondary | ICD-10-CM

## 2018-11-04 MED ORDER — GLUCOSE BLOOD VI STRP: ORAL_STRIP | 11 refills | Status: DC

## 2018-11-04 MED ORDER — ONETOUCH ULTRA 2 W/DEVICE KIT: PACK | 0 refills | Status: DC

## 2018-11-04 MED ORDER — ONETOUCH DELICA LANCETS 33G MISC: 11 refills | Status: DC

## 2018-11-04 NOTE — Progress Notes (Signed)
Careteam: Patient Care Team: Lauree Chandler, NP as PCP - General (Geriatric Medicine) Katherine Mantle, [place] (Optometry)  Advanced Directive information    Allergies  Allergen Reactions  . Hydrocodone Nausea And Vomiting  . Lisinopril     Chief Complaint  Patient presents with  . Medical Management of Chronic Issues    6 month follow-up   . Best Practice Recommendations    Place order for DEXA, discuss need for pneu 23 and Hep C Screening      HPI: Patient is a 68 y.o. female seen in the office today for routine follow up.  Insomnia - She has difficulty getting to sleeping and staying asleep. Tried melatonin in the past but was ineffective. Benadryl not helping. Sonata ineffective. She is taking OTC supplement and lavender which helps. States her problem is that she does not want to sleep, likes to get stuff done. Works nights.   DM - controlled; A1c 6.7%. Takes metformin 1000 mg daily. Urine micro/Cr ratio 50; LDL 108. She does not check BS at home as she does not have a working glucometer  HTN/hx CHF/atrial flutter - BP stable on amlodipine, losartan, coreg. Takes eliquis for anticoagulation. Followed by cardiology Dr Caryl Comes. Has not had her medication today, normally <130/80  Anemia - likely 2/2 CKD; she has iron deficiency as well. Iron 58; ferritin 51; Hgb 12.8.  Hyperlipidemia - LDL 108 on lipitor 40 mg.   Arthritis/LBP - stable on prn tramadol.   Claustrophobia -stable, no medication.   CKD - stable. Stage 2. Cr 1.17  Hx breast CA - tx with lumpectomy and XRT. No recurrence. She is UTD on mammograms  RLS - she has intermittent episodes of restless legs, uses requip occasionally.  Review of Systems:  Review of Systems  Constitutional: Negative for chills, fever and weight loss.  HENT: Negative for hearing loss.   Respiratory: Negative for cough, sputum production and shortness of breath.   Cardiovascular: Positive for leg swelling (at the end of  the day). Negative for chest pain and palpitations.  Gastrointestinal: Negative for abdominal pain, constipation, diarrhea and heartburn.  Genitourinary: Negative for dysuria, frequency and urgency.  Musculoskeletal: Positive for joint pain and myalgias. Negative for back pain and falls.  Skin: Negative.   Neurological: Negative for dizziness and headaches.  Psychiatric/Behavioral: Negative for depression and memory loss. The patient does not have insomnia.     Past Medical History:  Diagnosis Date  . Anxiety   . Arthritis    osteoarthritis  . Breast cancer ([place]) 2008   right, ER/PR -  . CHF (congestive heart failure) ([place])   . Depression   . Diabetes mellitus    TYPE 2  . Gastroparesis   . Hx of radiation therapy 04/04/07 to 05/20/07   right breast/6260 cGy  . Hypercholesterolemia   . Hypertension   . Lumbago   . Malignant neoplasm of breast (female), unspecified site   . Nonspecific elevation of levels of transaminase or lactic acid dehydrogenase (LDH)   . Osteoarthrosis, unspecified whether generalized or localized, unspecified site   . Regional enteritis of unspecified site   . Restless legs syndrome (RLS)   . TIA (transient ischemic attack)   . Urinary frequency   . Uterine prolapse without mention of vaginal wall prolapse   . Vitamin deficiency    Past Surgical History:  Procedure Laterality Date  . ABDOMINAL HYSTERECTOMY  1997   TAH/BSO, ENDOMETRIAL SARCOMA  . ARTHROPLASTY     left  knee  . BREAST LUMPECTOMY Right 11/17/06   re-excision 01/13/07  . RADICAL HYSTERECTOMY  1997  . TOTAL KNEE ARTHROPLASTY Left 06/10/2011      . TOTAL KNEE ARTHROPLASTY Right 2006, 2009   Dr Para March  . TOTAL KNEE ARTHROPLASTY Left 2008   Dr Para March  . WRIST SURGERY     carpel tunnel   Social History:   reports that she quit smoking about 27 years ago. She has a 10.00 pack-year smoking history. She has never used smokeless tobacco. She reports that she does not drink alcohol or use  drugs.  Family History  Problem Relation Age of Onset  . Diabetes Mother   . Diabetes Father   . Aneurysm Sister   . Arthritis Sister   . Diabetes Brother   . Heart Problems Brother   . Colon cancer Neg Hx     Medications: Patient's Medications  New Prescriptions   No medications on file  Previous Medications   AMLODIPINE (NORVASC) 10 MG TABLET    Take 0.5 tablets (5 mg total) by mouth daily.   APIXABAN (ELIQUIS) 5 MG TABS TABLET    Take 1 tablet (5 mg total) by mouth 2 (two) times daily.   ATORVASTATIN (LIPITOR) 40 MG TABLET    TAKE 1 TABLET ONCE DAILY   FOR CHOLESTEROL   CARVEDILOL (COREG) 25 MG TABLET    Take 1 tablet (25 mg total) by mouth 2 (two) times daily with a meal.   FERROUS SULFATE (IRON) 325 (65 FE) MG TABS    Take by mouth daily.   LOSARTAN (COZAAR) 100 MG TABLET    TAKE 1 TABLET ONCE DAILY TOCONTROL BLOOD PRESSURE   METFORMIN (GLUCOPHAGE) 1000 MG TABLET    Take 1 tablet (1,000 mg total) by mouth 2 (two) times daily with a meal.   ROPINIROLE (REQUIP) 0.25 MG TABLET    Take 2 tablets (0.5 mg total) by mouth at bedtime. For restless legs   TRAMADOL (ULTRAM) 50 MG TABLET    TAKE 1 TABLET BY MOUTH TWICE A DAY AS NEEDED  Modified Medications   No medications on file  Discontinued Medications   ATORVASTATIN (LIPITOR) 40 MG TABLET    TAKE 1 TABLET ONCE DAILY   FOR CHOLESTEROL     Physical Exam:  Vitals:   11/04/18 0818  BP: (!) 148/86  Pulse: 65  Temp: 97.7 F (36.5 C)  TempSrc: Oral  SpO2: 96%  Weight: 178 lb (80.7 kg)  Height: '5\' 6"'$  (1.676 m)   Body mass index is 28.73 kg/m.  Physical Exam Constitutional:      Appearance: Normal appearance. She is well-developed.  HENT:     Mouth/Throat:     Pharynx: No oropharyngeal exudate.  Eyes:     General: No scleral icterus.    Pupils: Pupils are equal, round, and reactive to light.  Neck:     Musculoskeletal: Neck supple.     Thyroid: No thyromegaly.     Vascular: No carotid bruit.     Trachea: No  tracheal deviation.  Cardiovascular:     Rate and Rhythm: Normal rate and regular rhythm.     Heart sounds: Murmur (1/6 SEM) present. No friction rub. No gallop.      Comments: +1 ptting LE edema b/l. no calf TTP. Varicose veins b/l in LE Pulmonary:     Effort: Pulmonary effort is normal. No respiratory distress.     Breath sounds: Normal breath sounds. No stridor. No wheezing or rales.  Abdominal:  General: Bowel sounds are normal. There is no distension.     Palpations: Abdomen is soft. Abdomen is not rigid. There is no hepatomegaly or mass.     Tenderness: There is no abdominal tenderness. There is no guarding or rebound.     Hernia: No hernia is present.     Comments: obese  Lymphadenopathy:     Cervical: No cervical adenopathy.  Skin:    General: Skin is warm and dry.     Findings: No rash.  Neurological:     Mental Status: She is alert and oriented to person, place, and time.  Psychiatric:        Behavior: Behavior normal.        Thought Content: Thought content normal.        Judgment: Judgment normal.     Labs reviewed: Basic Metabolic Panel: Recent Labs    01/14/18 0810 04/29/18 1100  NA 140 140  K 3.7 4.4  CL 106 106  CO2 20 25  GLUCOSE 196* 92  BUN 25 19  CREATININE 1.17* 1.06*  CALCIUM 9.0 9.4  TSH  --  1.49   Liver Function Tests: Recent Labs    04/29/18 1100  AST 28  ALT 21  BILITOT 0.6  PROT 7.8   No results for input(s): LIPASE, AMYLASE in the last 8760 hours. No results for input(s): AMMONIA in the last 8760 hours. CBC: Recent Labs    04/29/18 1100  WBC 3.1*  NEUTROABS 1,252*  HGB 12.8  HCT 37.2  MCV 91.9  PLT 214   Lipid Panel: Recent Labs    04/29/18 1100  CHOL 218*  HDL 93  LDLCALC 108*  TRIG 84  CHOLHDL 2.3   TSH: Recent Labs    04/29/18 1100  TSH 1.49   A1C: Lab Results  Component Value Date   HGBA1C 6.7 (H) 04/29/2018     Assessment/Plan 1. Estrogen deficiency - DG Bone Density; Future  2. Type 2  diabetes mellitus without complication, without long-term current use of insulin (H[facility]) -last A1c at goal, continue current regimen with diet modifications. - Hemoglobin A1c  3. Hyperlipidemia associated with type 2 diabetes mellitus ([place]) -continues lipitor, diet recommendations encouraged, will follow up lab today - CMP with eGFR([facility]) - Lipid Panel  4. Bilateral edema of lower extremity Stable, worse at the end of the day, norvasc most likely contributing.   5. Hypertension associated with diabetes ([place]) -stable on coreg, losartan and amlodipine.  - CMP with eGFR([facility])  6. Anemia, chronic disease -continue on iron daily - CBC with Differential/Platelets    Next appt: 6 months, sooner if needed   [place]. [place], [facility] [phone]

## 2018-11-04 NOTE — Patient Instructions (Signed)

## 2018-11-05 LAB — CBC WITH DIFFERENTIAL/PLATELET
Absolute Monocytes: 289 cells/uL (ref 200–950)
Basophils Absolute: 11 cells/uL (ref 0–200)
Basophils Relative: 0.3 %
Eosinophils Absolute: 148 cells/uL (ref 15–500)
Eosinophils Relative: 4 %
HCT: 33.4 % — ABNORMAL LOW (ref 35.0–45.0)
Hemoglobin: 11.2 g/dL — ABNORMAL LOW (ref 11.7–15.5)
Lymphs Abs: 1806 cells/uL (ref 850–3900)
MCH: 31.3 pg (ref 27.0–33.0)
MCHC: 33.5 g/dL (ref 32.0–36.0)
MCV: 93.3 fL (ref 80.0–100.0)
MPV: 10.7 fL (ref 7.5–12.5)
Monocytes Relative: 7.8 %
NEUTROS PCT: 39.1 %
Neutro Abs: 1447 cells/uL — ABNORMAL LOW (ref 1500–7800)
PLATELETS: 206 10*3/uL (ref 140–400)
RBC: 3.58 10*6/uL — ABNORMAL LOW (ref 3.80–5.10)
RDW: 12.6 % (ref 11.0–15.0)
TOTAL LYMPHOCYTE: 48.8 %
WBC: 3.7 10*3/uL — ABNORMAL LOW (ref 3.8–10.8)

## 2018-11-05 LAB — COMPLETE METABOLIC PANEL WITH GFR
AG Ratio: 1.4 (calc) (ref 1.0–2.5)
ALT: 20 U/L (ref 6–29)
AST: 24 U/L (ref 10–35)
Albumin: 4.2 g/dL (ref 3.6–5.1)
Alkaline phosphatase (APISO): 89 U/L (ref 37–153)
BILIRUBIN TOTAL: 0.5 mg/dL (ref 0.2–1.2)
BUN/Creatinine Ratio: 22 (calc) (ref 6–22)
BUN: 24 mg/dL (ref 7–25)
CO2: 23 mmol/L (ref 20–32)
Calcium: 9.3 mg/dL (ref 8.6–10.4)
Chloride: 107 mmol/L (ref 98–110)
Creat: 1.07 mg/dL — ABNORMAL HIGH (ref 0.50–0.99)
GFR, Est African American: 61 mL/min/{1.73_m2} (ref >=60)
GFR, Est Non African American: 53 mL/min/{1.73_m2} — ABNORMAL LOW (ref >=60)
Globulin: 3 g/dL (calc) (ref 1.9–3.7)
Glucose, Bld: 74 mg/dL (ref 65–99)
POTASSIUM: 3.9 mmol/L (ref 3.5–5.3)
Sodium: 140 mmol/L (ref 135–146)
Total Protein: 7.2 g/dL (ref 6.1–8.1)

## 2018-11-05 LAB — HEMOGLOBIN A1C
Hgb A1c MFr Bld: 6.4 % of total Hgb — ABNORMAL HIGH (ref <5.7)
MEAN PLASMA GLUCOSE: 137 (calc)
eAG (mmol/L): 7.6 (calc)

## 2018-11-05 LAB — LIPID PANEL
Cholesterol: 189 mg/dL (ref <200)
HDL: 92 mg/dL (ref >=50)
LDL Cholesterol (Calc): 84 mg/dL (calc)
Non-HDL Cholesterol (Calc): 97 mg/dL (calc) (ref <130)
Total CHOL/HDL Ratio: 2.1 (calc) (ref <5.0)
Triglycerides: 52 mg/dL (ref <150)

## 2018-11-11 ENCOUNTER — Other Ambulatory Visit: Payer: Self-pay | Admitting: *Deleted

## 2018-11-11 MED ORDER — ACCU-CHEK AVIVA PLUS W/DEVICE KIT: PACK | 0 refills | Status: DC

## 2018-11-11 MED ORDER — GLUCOSE BLOOD VI STRP: ORAL_STRIP | 1 refills | Status: DC

## 2018-11-11 MED ORDER — ACCU-CHEK FASTCLIX LANCETS MISC: 1 refills | Status: DC

## 2018-11-14 ENCOUNTER — Other Ambulatory Visit: Payer: Self-pay | Admitting: Nurse Practitioner

## 2018-11-14 DIAGNOSIS — Z1231 Encounter for screening mammogram for malignant neoplasm of breast: Secondary | ICD-10-CM

## 2018-12-05 ENCOUNTER — Other Ambulatory Visit: Payer: Self-pay | Admitting: Nurse Practitioner

## 2018-12-07 ENCOUNTER — Other Ambulatory Visit: Payer: Self-pay | Admitting: Nurse Practitioner

## 2018-12-07 DIAGNOSIS — M1990 Unspecified osteoarthritis, unspecified site: Secondary | ICD-10-CM

## 2018-12-08 NOTE — Telephone Encounter (Signed)
Last filled 10/11/2018   [facility] Database verified and compliance confirmed

## 2018-12-14 ENCOUNTER — Telehealth: Payer: Self-pay

## 2018-12-14 NOTE — Telephone Encounter (Signed)
Spoke with pt regarding her appt with Dr Caryl Comes on 4/17. She declined a virtual and telephone visit with Dr Caryl Comes and states she is doing really well. She denies SOB, CP, syncope, or palpitations. She does not need any refills at this time. She has agreed to reschedule for August. She understands to call the office if anything changes between now and then.

## 2018-12-23 ENCOUNTER — Ambulatory Visit: Payer: BLUE CROSS/BLUE SHIELD | Admitting: Internal Medicine

## 2018-12-30 ENCOUNTER — Other Ambulatory Visit: Payer: Self-pay | Admitting: Internal Medicine

## 2018-12-30 MED ORDER — LOSARTAN POTASSIUM 100 MG PO TABS: ORAL_TABLET | ORAL | 0 refills | Status: DC

## 2018-12-30 NOTE — Telephone Encounter (Signed)
New Message            *STAT* If patient is at the pharmacy, call can be transferred to refill team.   1. Which medications need to be refilled? (please list name of each medication and dose if known) Losartin 100 mg  2. Which pharmacy/location (including street and city if local pharmacy) is medication to be sent to? [facility] [address]  3. Do they need a 30 day or 90 day supply? 90  Patient took her last pill today

## 2018-12-30 NOTE — Telephone Encounter (Signed)
Pt's medication was sent to pt's pharmacy as requested. Confirmation received.  °

## 2019-01-01 ENCOUNTER — Encounter: Payer: Self-pay | Admitting: Nurse Practitioner

## 2019-01-06 ENCOUNTER — Ambulatory Visit: Payer: BLUE CROSS/BLUE SHIELD

## 2019-01-06 ENCOUNTER — Other Ambulatory Visit: Payer: BLUE CROSS/BLUE SHIELD

## 2019-01-25 ENCOUNTER — Other Ambulatory Visit: Payer: Self-pay | Admitting: Internal Medicine

## 2019-02-11 ENCOUNTER — Other Ambulatory Visit: Payer: Self-pay | Admitting: Nurse Practitioner

## 2019-02-11 DIAGNOSIS — M1990 Unspecified osteoarthritis, unspecified site: Secondary | ICD-10-CM

## 2019-02-13 NOTE — Telephone Encounter (Signed)
Last filled 12/08/2018  [facility] Database verified and compliance confirmed

## 2019-02-21 ENCOUNTER — Other Ambulatory Visit: Payer: Self-pay | Admitting: Pharmacist

## 2019-02-21 MED ORDER — APIXABAN 5 MG PO TABS: 5.0000 mg | ORAL_TABLET | Freq: Two times a day (BID) | ORAL | 1 refills | Status: DC

## 2019-02-21 NOTE — Progress Notes (Signed)
Age 68, weight 80kg, SCr 1.07 on 10/2018, last OV 12/2017, afib indication, appt scheduled in August for f/u

## 2019-02-24 ENCOUNTER — Other Ambulatory Visit: Payer: Self-pay

## 2019-02-24 ENCOUNTER — Ambulatory Visit
Admission: RE | Admit: 2019-02-24 | Discharge: 2019-02-24 | Disposition: A | Discharge status: Home / Self Care | Payer: 59 | Source: Ambulatory Visit | Attending: Nurse Practitioner | Admitting: Nurse Practitioner

## 2019-02-24 DIAGNOSIS — Z1231 Encounter for screening mammogram for malignant neoplasm of breast: Secondary | ICD-10-CM

## 2019-02-24 DIAGNOSIS — E2839 Other primary ovarian failure: Secondary | ICD-10-CM

## 2019-03-20 ENCOUNTER — Other Ambulatory Visit: Payer: Self-pay | Admitting: Internal Medicine

## 2019-03-20 MED ORDER — LOSARTAN POTASSIUM 100 MG PO TABS: ORAL_TABLET | ORAL | 0 refills | Status: DC

## 2019-03-27 ENCOUNTER — Other Ambulatory Visit: Payer: Self-pay

## 2019-03-27 DIAGNOSIS — G2581 Restless legs syndrome: Secondary | ICD-10-CM

## 2019-03-27 MED ORDER — ROPINIROLE HCL 0.25 MG PO TABS: 0.5000 mg | ORAL_TABLET | Freq: Every day | ORAL | 1 refills | Status: DC

## 2019-04-22 ENCOUNTER — Other Ambulatory Visit: Payer: Self-pay | Admitting: Nurse Practitioner

## 2019-04-22 DIAGNOSIS — M1990 Unspecified osteoarthritis, unspecified site: Secondary | ICD-10-CM

## 2019-04-24 NOTE — Telephone Encounter (Signed)
Last filled in [facility] on 02/13/2019, RX request sent to Lauree Chandler, NP to review and approve if necessary

## 2019-05-01 ENCOUNTER — Encounter: Payer: Self-pay | Admitting: Internal Medicine

## 2019-05-01 ENCOUNTER — Ambulatory Visit (INDEPENDENT_AMBULATORY_CARE_PROVIDER_SITE_OTHER): Payer: 59 | Admitting: Internal Medicine

## 2019-05-01 ENCOUNTER — Other Ambulatory Visit: Payer: Self-pay

## 2019-05-01 ENCOUNTER — Encounter (INDEPENDENT_AMBULATORY_CARE_PROVIDER_SITE_OTHER): Payer: Self-pay

## 2019-05-01 VITALS — BP 124/82 | HR 70 | Ht 66.0 in | Wt 174.0 lb

## 2019-05-01 DIAGNOSIS — I429 Cardiomyopathy, unspecified: Secondary | ICD-10-CM | POA: Diagnosis not present

## 2019-05-01 DIAGNOSIS — I480 Paroxysmal atrial fibrillation: Secondary | ICD-10-CM | POA: Diagnosis not present

## 2019-05-01 DIAGNOSIS — I483 Typical atrial flutter: Secondary | ICD-10-CM | POA: Diagnosis not present

## 2019-05-01 LAB — CBC
Hematocrit: 34.5 % (ref 34.0–46.6)
Hemoglobin: 12 g/dL (ref 11.1–15.9)
MCH: 32.7 pg (ref 26.6–33.0)
MCHC: 34.8 g/dL (ref 31.5–35.7)
MCV: 94 fL (ref 79–97)
Platelets: 224 10*3/uL (ref 150–450)
RBC: 3.67 x10E6/uL — ABNORMAL LOW (ref 3.77–5.28)
RDW: 11.5 % — ABNORMAL LOW (ref 11.7–15.4)
WBC: 5.3 10*3/uL (ref 3.4–10.8)

## 2019-05-01 NOTE — Patient Instructions (Addendum)
Medication Instructions:   Your physician recommends that you continue on your current medications as directed. Please refer to the Current Medication list given to you today.  If you need a refill on your cardiac medications before your next appointment, please call your pharmacy.   Lab work:  You will have labs drawn today: CBC  If you have labs (blood work) drawn today and your tests are completely normal, you will receive your results only by: [name] Kitchen MyChart Message (if you have MyChart) OR . A paper copy in the mail If you have any lab test that is abnormal or we need to change your treatment, we will call you to review the results.  Testing/Procedures:  None ordered today  Follow-Up: At [facility], you and your health needs are our priority.  As part of our continuing mission to provide you with exceptional heart care, we have created designated Provider Care Teams.  These Care Teams include your primary Cardiologist (physician) and Advanced Practice Providers (APPs -  Physician Assistants and Nurse Practitioners) who all work together to provide you with the care you need, when you need it. You will need a follow up appointment in 12 months.  Please call our office 2 months in advance to schedule this appointment.  You may see [name], MD or one of the following Advanced Practice Providers on your designated Care Team:   Chanetta Marshall, NP . Tommye Standard, PA-C

## 2019-05-01 NOTE — Progress Notes (Signed)
He     Patient Care Team: Lauree Chandler, NP as PCP - General (Geriatric Medicine) Katherine Mantle, OD (Optometry)   HPI  [name] is a 68 y.o. female Seen in followup for a diagnosis of atrial flutter/fibrillation in the context of a cardiomyopathy and a prior TIA.  She also has hypertension  She takes apixaban.  No bleeding  The patient denies chest pain, shortness of breath, nocturnal dyspnea, orthopnea or peripheral edema.  There have been no palpitations, lightheadedness or syncope.        DATE TEST EF   2001  LHC 40 %   6/14 Echo   65 %           Date Cr K Hgb  2/19 0.89 3.8 12.8 (8/19)   2/20 1.07 3.9 11.2       Past Medical History:  Diagnosis Date  . Anxiety   . Arthritis    osteoarthritis  . Breast cancer ([place]) 2008   right, ER/PR -  . CHF (congestive heart failure) ([place])   . Depression   . Diabetes mellitus    TYPE 2  . Gastroparesis   . Hx of radiation therapy 04/04/07 to 05/20/07   right breast/6260 cGy  . Hypercholesterolemia   . Hypertension   . Lumbago   . Malignant neoplasm of breast (female), unspecified site   . Nonspecific elevation of levels of transaminase or lactic acid dehydrogenase (LDH)   . Osteoarthrosis, unspecified whether generalized or localized, unspecified site   . Regional enteritis of unspecified site   . Restless legs syndrome (RLS)   . TIA (transient ischemic attack)   . Urinary frequency   . Uterine prolapse without mention of vaginal wall prolapse   . Vitamin deficiency     Past Surgical History:  Procedure Laterality Date  . ABDOMINAL HYSTERECTOMY  1997   TAH/BSO, ENDOMETRIAL SARCOMA  . ARTHROPLASTY     left knee  . BREAST LUMPECTOMY Right 11/17/06   re-excision 01/13/07  . RADICAL HYSTERECTOMY  1997  . TOTAL KNEE ARTHROPLASTY Left 06/10/2011      . TOTAL KNEE ARTHROPLASTY Right 2006, 2009   Dr Para March  . TOTAL KNEE ARTHROPLASTY Left 2008   Dr Para March  . WRIST SURGERY     carpel tunnel     Current Outpatient Medications  Medication Sig Dispense Refill  . Accu-Chek FastClix Lancets MISC Check blood sugar once daily as directed E11.59 300 each 1  . amLODipine (NORVASC) 10 MG tablet Take 0.5 tablets (5 mg total) by mouth daily. 90 tablet 1  . apixaban (ELIQUIS) 5 MG TABS tablet Take 1 tablet (5 mg total) by mouth 2 (two) times daily. 180 tablet 1  . atorvastatin (LIPITOR) 40 MG tablet TAKE 1 TABLET ONCE DAILY   FOR CHOLESTEROL 90 tablet 1  . Blood Glucose Monitoring Suppl (ACCU-CHEK AVIVA PLUS) w/Device KIT Check blood sugar once daily as directed E11.59  Acc-Chek guide 1 kit 0  . carvedilol (COREG) 25 MG tablet TAKE 1 TABLET TWICE DAILY  WITH MEALS 180 tablet 1  . Ferrous Sulfate (IRON) 325 (65 Fe) MG TABS Take by mouth daily.    [name] Kitchen glucose blood (ACCU-CHEK GUIDE) test strip Use as instructed Check blood sugar once daily as directed E11.59 300 each 1  . losartan (COZAAR) 100 MG tablet TAKE 1 TABLET ONCE DAILY TOCONTROL BLOOD PRESSURE. Please keep upcoming appt in August with Dr. Caryl Comes for future refills. Thank you 90 tablet 0  . metFORMIN (GLUCOPHAGE)  1000 MG tablet Take 1,000 mg by mouth daily with breakfast.    . rOPINIRole (REQUIP) 0.25 MG tablet Take 2 tablets (0.5 mg total) by mouth at bedtime. For restless legs 180 tablet 1  . traMADol (ULTRAM) 50 MG tablet TAKE 1 TABLET BY MOUTH TWICE A DAY AS NEEDED 60 tablet 0   No current facility-administered medications for this visit.     Allergies  Allergen Reactions  . Hydrocodone Nausea And Vomiting  . Lisinopril     Review of Systems negative except from HPI and PMH  Physical Exam BP 124/82   Pulse 70   Ht '5\' 6"'$  (1.676 m)   Wt 174 lb (78.9 kg)   SpO2 99%   BMI 28.08 kg/m   Well developed and nourished in no acute distress HENT normal Neck supple with JVP-  flat   Clear Regular rate and rhythm, no murmurs or gallops Abd-soft with active BS No Clubbing cyanosis edema Skin-warm and dry A & Oriented  Grossly normal  sensory and motor function  ECG sinus 70 22/09/39    Assessment and  Plan  Hypertension  Afib    TIA   BP well controlled  Edema resolved on lower dose amlodipine  On Anticoagulation;  No bleeding issues   willcheck CBC as there had been a drop in Hgb 8/19>>2/20  On Fe replacement

## 2019-05-03 ENCOUNTER — Telehealth: Payer: Self-pay | Admitting: Internal Medicine

## 2019-05-03 NOTE — Telephone Encounter (Signed)
Attempted to call the patient. No answer- I left a message to please call back.  

## 2019-05-03 NOTE — Telephone Encounter (Signed)
I spoke with the patient. She has been made aware of her lab results.

## 2019-05-03 NOTE — Telephone Encounter (Signed)
Notes recorded by Deboraha Sprang, MD on 05/03/2019 at 8:20 AM EDT  Please Inform Patient that labs show Hgb is improved back towards but not quite at normal Thanks

## 2019-05-05 ENCOUNTER — Other Ambulatory Visit: Payer: Self-pay

## 2019-05-05 ENCOUNTER — Ambulatory Visit (INDEPENDENT_AMBULATORY_CARE_PROVIDER_SITE_OTHER): Payer: 59 | Admitting: Nurse Practitioner

## 2019-05-05 ENCOUNTER — Encounter: Payer: Self-pay | Admitting: Nurse Practitioner

## 2019-05-05 ENCOUNTER — Ambulatory Visit: Payer: 59 | Admitting: Nurse Practitioner

## 2019-05-05 VITALS — BP 138/84 | HR 63 | Temp 97.7°F | Ht 66.0 in | Wt 182.4 lb

## 2019-05-05 VITALS — BP 138/84 | HR 63 | Temp 97.7°F | Ht 66.0 in | Wt 182.3 lb

## 2019-05-05 DIAGNOSIS — E119 Type 2 diabetes mellitus without complications: Secondary | ICD-10-CM

## 2019-05-05 DIAGNOSIS — Z23 Encounter for immunization: Secondary | ICD-10-CM

## 2019-05-05 DIAGNOSIS — D638 Anemia in other chronic diseases classified elsewhere: Secondary | ICD-10-CM | POA: Diagnosis not present

## 2019-05-05 DIAGNOSIS — Z0000 Encounter for general adult medical examination without abnormal findings: Secondary | ICD-10-CM

## 2019-05-05 DIAGNOSIS — E1122 Type 2 diabetes mellitus with diabetic chronic kidney disease: Secondary | ICD-10-CM

## 2019-05-05 DIAGNOSIS — N183 Chronic kidney disease, stage 3 unspecified: Secondary | ICD-10-CM

## 2019-05-05 DIAGNOSIS — E1169 Type 2 diabetes mellitus with other specified complication: Secondary | ICD-10-CM | POA: Diagnosis not present

## 2019-05-05 DIAGNOSIS — R600 Localized edema: Secondary | ICD-10-CM

## 2019-05-05 DIAGNOSIS — M1990 Unspecified osteoarthritis, unspecified site: Secondary | ICD-10-CM

## 2019-05-05 DIAGNOSIS — E785 Hyperlipidemia, unspecified: Secondary | ICD-10-CM

## 2019-05-05 DIAGNOSIS — I10 Essential (primary) hypertension: Secondary | ICD-10-CM

## 2019-05-05 DIAGNOSIS — I480 Paroxysmal atrial fibrillation: Secondary | ICD-10-CM

## 2019-05-05 DIAGNOSIS — G2581 Restless legs syndrome: Secondary | ICD-10-CM

## 2019-05-05 MED ORDER — ROPINIROLE HCL 0.25 MG PO TABS: 0.2500 mg | ORAL_TABLET | Freq: Every day | ORAL | 1 refills | Status: DC

## 2019-05-05 NOTE — Progress Notes (Signed)
Subjective:   [name] is a 68 y.o. female who presents for Medicare Annual (Subsequent) preventive examination.  Review of Systems:   Cardiac Risk Factors include: advanced age (>83mn, >>39women);diabetes mellitus;dyslipidemia;hypertension;sedentary lifestyle     Objective:     Vitals: BP 138/84   Pulse 63   Temp 97.7 F (36.5 C) (Oral)   Ht _0  (1.676 m)   Wt 182 lb 5.1 oz (82.7 kg)   SpO2 95%   BMI 29.43 kg/m   Body mass index is 29.43 kg/m.  Advanced Directives 05/05/2019 10/15/2017 08/10/2017 04/09/2017 10/16/2016 04/10/2016 10/11/2015  Does Patient Have a Medical Advance Directive? _1  No No  Would patient like information on creating a medical advance directive? No - Patient declined No - Patient declined - - No - Patient declined Yes - EScientist, clinical (histocompatibility and immunogenetics)given Yes - EScientist, clinical (histocompatibility and immunogenetics)given    Tobacco Social History   Tobacco Use  Smoking Status Former Smoker  . Packs/day: 0.50  . Years: 20.00  . Pack years: 10.00  . Quit date: 09/08/1991  . Years since quitting: 27.6  Smokeless Tobacco Never Used     Counseling given: Not Answered   Clinical Intake:  Pre-visit preparation completed: Yes  Pain : No/denies pain     BMI - recorded: 29.43 Nutritional Status: BMI 25 -29 Overweight Diabetes: Yes  How often do you need to have someone help you when you read instructions, pamphlets, or other written materials from your doctor or pharmacy?: 3 - Sometimes What is the last grade level you completed in school?: 7th grade  Interpreter Needed?: No     Past Medical History:  Diagnosis Date  . Anxiety   . Arthritis    osteoarthritis  . [place]   right, ER/PR -  . [place]) [place]   . Depression   . Diabetes mellitus    TYPE 2  . Gastroparesis   . Hx of radiation therapy 04/04/07 to 05/20/07   right breast/6260 cGy  . Hypercholesterolemia   . Hypertension   . Lumbago   . Malignant neoplasm of  breast (female), unspecified site   . Nonspecific elevation of levels of transaminase or lactic acid dehydrogenase (LDH)   . Osteoarthrosis, unspecified whether generalized or localized, unspecified site   . Regional enteritis of unspecified site   . Restless legs syndrome (RLS)   . TIA (transient ischemic attack)   . Urinary frequency   . Uterine prolapse without mention of vaginal wall prolapse   . Vitamin deficiency    Past Surgical History:  Procedure Laterality Date  . ABDOMINAL HYSTERECTOMY  1997   TAH/BSO, ENDOMETRIAL SARCOMA  . ARTHROPLASTY     left knee  . BREAST LUMPECTOMY Right 11/17/06   re-excision 01/13/07  . RADICAL HYSTERECTOMY  1997  . TOTAL KNEE ARTHROPLASTY Left 06/10/2011      . TOTAL KNEE ARTHROPLASTY Right 2006, 2009   Dr WPara March . TOTAL KNEE ARTHROPLASTY Left 2008   Dr WPara March . WRIST SURGERY     carpel tunnel   Family History  Problem Relation Age of Onset  . Diabetes Mother   . Diabetes Father   . Aneurysm Sister   . Arthritis Sister   . Diabetes Brother   . Heart Problems Brother   . Colon cancer Neg Hx    Social History   Socioeconomic History  . Marital status: Widowed    Spouse name: Not on file  .  Number of children: 4  . Years of education: Not on file  . Highest education level: Not on file  Occupational History  . Occupation: [employer]  Social Needs  . Financial resource strain: Not on file  . Food insecurity    Worry: Not on file    Inability: Not on file  . Transportation needs    Medical: Not on file    Non-medical: Not on file  Tobacco Use  . Smoking status: Former Smoker    Packs/day: 0.50    Years: 20.00    Pack years: 10.00    Quit date: 09/08/1991    Years since quitting: 27.6  . Smokeless tobacco: Never Used  Substance and Sexual Activity  . Alcohol use: No  . Drug use: No  . Sexual activity: Never  Lifestyle  . Physical activity    Days per week: Not on file    Minutes per session: Not on file  .  Stress: Not on file  Relationships  . Social Herbalist on phone: Not on file    Gets together: Not on file    Attends religious service: Not on file    Active member of club or organization: Not on file    Attends meetings of clubs or organizations: Not on file    Relationship status: Not on file  Other Topics Concern  . Not on file  Social History Narrative   Widowed   Lives with daughter   Former smoker-stopped 1993   Alcohol none   Exercise 3 days a week [facility] [employer]     Outpatient Encounter Medications as of 05/05/2019  Medication Sig  . Accu-Chek FastClix Lancets MISC Check blood sugar once daily as directed E11.59  . amLODipine (NORVASC) 10 MG tablet Take 0.5 tablets (5 mg total) by mouth daily.  [name] Kitchen apixaban (ELIQUIS) 5 MG TABS tablet Take 1 tablet (5 mg total) by mouth 2 (two) times daily.  [name] Kitchen atorvastatin (LIPITOR) 40 MG tablet TAKE 1 TABLET ONCE DAILY   FOR CHOLESTEROL  . Blood Glucose Monitoring Suppl (ACCU-CHEK AVIVA PLUS) w/Device KIT Check blood sugar once daily as directed E11.59  Acc-Chek guide (Patient not taking: Reported on 05/05/2019)  . carvedilol (COREG) 25 MG tablet TAKE 1 TABLET TWICE DAILY  WITH MEALS  . Ferrous Sulfate (IRON) 325 (65 Fe) MG TABS Take by mouth daily.  [name] Kitchen glucose blood (ACCU-CHEK GUIDE) test strip Use as instructed Check blood sugar once daily as directed E11.59 (Patient not taking: Reported on 05/05/2019)  . losartan (COZAAR) 100 MG tablet TAKE 1 TABLET ONCE DAILY TOCONTROL BLOOD PRESSURE. Please keep upcoming appt in August with Dr. Caryl Comes for future refills. Thank you  . metFORMIN (GLUCOPHAGE) 1000 MG tablet Take 1,000 mg by mouth daily with breakfast.  . rOPINIRole (REQUIP) 0.25 MG tablet Take 2 tablets (0.5 mg total) by mouth at bedtime. For restless legs  . traMADol (ULTRAM) 50 MG tablet TAKE 1 TABLET BY MOUTH TWICE A DAY AS NEEDED   No facility-administered encounter medications on file as of 05/05/2019.     Activities  of Daily Living In your present state of health, do you have any difficulty performing the following activities: 05/05/2019  Hearing? N  Vision? N  Difficulty concentrating or making decisions? N  Walking or climbing stairs? N  Dressing or bathing? N  Doing errands, shopping? N  Preparing Food and eating ? N  Using the Toilet? N  In the past six  months, have you accidently leaked urine? N  Do you have problems with loss of bowel control? N  Managing your Medications? N  Managing your Finances? N  Housekeeping or managing your Housekeeping? N  Some recent data might be hidden    Patient Care Team: Lauree Chandler, NP as PCP - General (Geriatric Medicine) Katherine Mantle, OD (Optometry)    Assessment:   This is a routine wellness examination for [facility].  Exercise Activities and Dietary recommendations Current Exercise Habits: The patient does not participate in regular exercise at present  Goals    . Increase physical activity     Start using stationary bike 45 mins 3 days a week        Fall Risk Fall Risk  05/05/2019 11/04/2018 04/29/2018 10/15/2017 04/09/2017  Falls in the past year? 0 0 No No No  Number falls in past yr: - 0 - - -  Injury with Fall? - 0 - - -   Is the patient's home free of loose throw rugs in walkways, pet beds, electrical cords, etc?  yes      Grab bars in the bathroom? yes      Handrails on the stairs?   yes      Adequate lighting?   yes  Timed Get Up and Go performed: na  Depression Screen PHQ 2/9 Scores 05/05/2019 04/29/2018 10/15/2017 04/09/2017  PHQ - 2 Score 0 0 0 0  PHQ- 9 Score - - - -     Cognitive Function MMSE - Mini Mental State Exam 04/29/2018 04/09/2017 04/10/2016 04/05/2015  Orientation to time _0 Orientation to Place _1 Registration _2 Attention/ Calculation _3 Recall _4 Language- name 2 objects _5 Language- repeat _6 Language- follow 3 step command _7 Language- read & follow direction _8 Write a sentence 0 _9 Copy design 1 0 0 1  Total score _10 Immunization History  Administered Date(s) Administered  . Influenza, High Dose Seasonal PF 06/04/2017, 05/24/2018  . Influenza,inj,Quad PF,6+ Mos 07/05/2015  . Influenza-Unspecified 07/05/2013, 06/07/2014, 07/08/2016  . Pneumococcal Conjugate-13 03/07/2014  . Pneumococcal Polysaccharide-23 06/08/1999, 11/04/2018  . Td 09/08/2003  . Tdap 11/29/2013  . Zoster 08/08/2013    Qualifies for Shingles Vaccine?yes   Screening Tests Health Maintenance  Topic Date Due  . INFLUENZA VACCINE  04/08/2019  . OPHTHALMOLOGY EXAM  04/29/2019  . FOOT EXAM  04/30/2019  . HEMOGLOBIN A1C  05/05/2019  . MAMMOGRAM  02/23/2021  . TETANUS/TDAP  11/30/2023  . COLONOSCOPY  01/17/2025  . DEXA SCAN  Completed  . PNA vac Low Risk Adult  Completed  . Hepatitis C Screening  Discontinued    Cancer Screenings: Lung: Low Dose CT Chest recommended if Age 26-80 years, 30 pack-year currently smoking OR have quit w/in 15years. Patient does not qualify. Breast:  Up to date on Mammogram? Yes   Up to date of Bone Density/Dexa? Yes Colorectal: up to date  Additional Screenings: Hepatitis C Screening: na     Plan:      I have personally reviewed and noted the following in the patient's chart:   . Medical and social history . Use of alcohol, tobacco or illicit drugs  . Current medications and supplements . Functional  ability and status . Nutritional status . Physical activity . Advanced directives . List of other physicians . Hospitalizations, surgeries, and ER visits in previous 12 months . Vitals . Screenings to include cognitive, depression, and falls . Referrals and appointments  In addition, I have reviewed and discussed with patient certain preventive protocols, quality metrics, and best practice recommendations. A written personalized care plan for preventive services as well as general preventive health  recommendations were provided to patient.     Lauree Chandler, NP  05/05/2019

## 2019-05-05 NOTE — Progress Notes (Signed)
 Provider: ,  K, NP  Patient Care Team: ,  K, NP as PCP - General (Geriatric Medicine) Glossen, George, OD (Optometry)  Extended Emergency Contact Information Primary Emergency Contact: Galbreath,Barbara Address: [address]          [place], [place] [address] [place] Home Phone: [phone] Relation: Daughter Allergies  Allergen Reactions  . Hydrocodone Nausea And Vomiting  . Lisinopril    Code Status: FULL Goals of Care: Advanced Directive information Advanced Directives 05/05/2019  Does Patient Have a Medical Advance Directive? No  Would patient like information on creating a medical advance directive? No - Patient declined     Chief Complaint  Patient presents with  . Annual Exam    Annual physical    HPI: Patient is a 68 y.o. female seen in today for an annual wellness exam.    Diet? Eating more pasta, started doing better and eating more vegetables.   Exercise?very active at work, will start using stationary bike at home  Dentition: going next month, routinely every 6 month  Ophthalmology appt: 2 weeks for routine follow up, goes routinely  Routine specialist: CARDIOLOGIST, saw this week GYN- does breast and pelvic exam, going next month   OA- stable, uses tramadol as needed, when she does not work and does not need as much  DM- continues on metformin 1000 mg daily, 140 avg  htn- controlled on current regimen- losartan, coreg twice daily,   A fib- rate controlled, continues on eliquis for anticoagulation.   RLS- controlled on requip 0.25 mg at bedtime.   Insomnia- ongoing issue- too many things she wants to do. Works at night, goes to bed at 8 am and sleeps until 1pm. And has to be at work at 8 pm.  Trying to learn how to sleep.  Has worked nights for 40 years.   Anemia- stable on iron tablet.  Depression screen PHQ 2/9 05/05/2019 04/29/2018 10/15/2017 04/09/2017 04/10/2016  Decreased Interest 0 0 0 0 0  Down,  Depressed, Hopeless 0 0 0 0 0  PHQ - 2 Score 0 0 0 0 0  Altered sleeping - - - - -  Tired, decreased energy - - - - -  Change in appetite - - - - -  Feeling bad or failure about yourself  - - - - -  Trouble concentrating - - - - -  Moving slowly or fidgety/restless - - - - -  Suicidal thoughts - - - - -  PHQ-9 Score - - - - -  Some recent data might be hidden    Fall Risk  05/05/2019 11/04/2018 04/29/2018 10/15/2017 04/09/2017  Falls in the past year? 0 0 No No No  Number falls in past yr: - 0 - - -  Injury with Fall? - 0 - - -   MMSE - Mini Mental State Exam 04/29/2018 04/09/2017 04/10/2016 04/05/2015  Orientation to time 5 5 5 5  Orientation to Place 5 5 5 5  Registration 3 3 3 3  Attention/ Calculation 5 4 5 4  Recall 3 3 2 2  Language- name 2 objects 2 2 2 2  Language- repeat 1 1 1 1  Language- follow 3 step command 3 3 3 3  Language- read & follow direction 1 1 1 1  Write a sentence 0 1 1 1  Copy design 1 0 0 1  Total score 29 28 28 28     Health Maintenance  Topic Date Due  .   INFLUENZA VACCINE  04/08/2019  . OPHTHALMOLOGY EXAM  04/29/2019  . FOOT EXAM  04/30/2019  . HEMOGLOBIN A1C  05/05/2019  . MAMMOGRAM  02/23/2021  . TETANUS/TDAP  11/30/2023  . COLONOSCOPY  01/17/2025  . DEXA SCAN  Completed  . PNA vac Low Risk Adult  Completed  . Hepatitis C Screening  Discontinued    Past Medical History:  Diagnosis Date  . Anxiety   . Arthritis    osteoarthritis  . Breast cancer (HCC) 2008   right, ER/PR -  . CHF (congestive heart failure) (HCC)   . Depression   . Diabetes mellitus    TYPE 2  . Gastroparesis   . Hx of radiation therapy 04/04/07 to 05/20/07   right breast/6260 cGy  . Hypercholesterolemia   . Hypertension   . Lumbago   . Malignant neoplasm of breast (female), unspecified site   . Nonspecific elevation of levels of transaminase or lactic acid dehydrogenase (LDH)   . Osteoarthrosis, unspecified whether generalized or localized, unspecified site   .  Regional enteritis of unspecified site   . Restless legs syndrome (RLS)   . TIA (transient ischemic attack)   . Urinary frequency   . Uterine prolapse without mention of vaginal wall prolapse   . Vitamin deficiency     Past Surgical History:  Procedure Laterality Date  . ABDOMINAL HYSTERECTOMY  1997   TAH/BSO, ENDOMETRIAL SARCOMA  . ARTHROPLASTY     left knee  . BREAST LUMPECTOMY Right 11/17/06   re-excision 01/13/07  . RADICAL HYSTERECTOMY  1997  . TOTAL KNEE ARTHROPLASTY Left 06/10/2011      . TOTAL KNEE ARTHROPLASTY Right 2006, 2009   Dr Weiner  . TOTAL KNEE ARTHROPLASTY Left 2008   Dr Weiner  . WRIST SURGERY     carpel tunnel    Social History   Socioeconomic History  . Marital status: Widowed    Spouse name: Not on file  . Number of children: 4  . Years of education: Not on file  . Highest education level: Not on file  Occupational History  . Occupation: [employer]  Social Needs  . Financial resource strain: Not on file  . Food insecurity    Worry: Not on file    Inability: Not on file  . Transportation needs    Medical: Not on file    Non-medical: Not on file  Tobacco Use  . Smoking status: Former Smoker    Packs/day: 0.50    Years: 20.00    Pack years: 10.00    Quit date: 09/08/1991    Years since quitting: 27.6  . Smokeless tobacco: Never Used  Substance and Sexual Activity  . Alcohol use: No  . Drug use: No  . Sexual activity: Never  Lifestyle  . Physical activity    Days per week: Not on file    Minutes per session: Not on file  . Stress: Not on file  Relationships  . Social connections    Talks on phone: Not on file    Gets together: Not on file    Attends religious service: Not on file    Active member of club or organization: Not on file    Attends meetings of clubs or organizations: Not on file    Relationship status: Not on file  Other Topics Concern  . Not on file  Social History Narrative   Widowed   Lives with daughter    Former smoker-stopped 1993   Alcohol none     Exercise 3 days a [facility] [employer] [facility]     Family History  Problem Relation Age of Onset  . Diabetes Mother   . Diabetes Father   . Aneurysm Sister   . Arthritis Sister   . Diabetes Brother   . Heart Problems Brother   . Colon cancer Neg Hx     Review of Systems:  Review of Systems  Constitutional: Negative for chills, fever and weight loss.  HENT: Negative for hearing loss.   Respiratory: Negative for cough, sputum production and shortness of breath.   Cardiovascular: Negative for chest pain, palpitations and leg swelling.  Gastrointestinal: Negative for abdominal pain, constipation, diarrhea and heartburn.  Genitourinary: Negative for dysuria, frequency and urgency.  Musculoskeletal: Positive for joint pain and myalgias. Negative for back pain and falls.  Skin: Negative.   Neurological: Negative for dizziness and headaches.  Psychiatric/Behavioral: Negative for depression and memory loss. The patient has insomnia.      Allergies as of 05/05/2019      Reactions   Hydrocodone Nausea And Vomiting   Lisinopril       Medication List       Accurate as of May 05, 2019  9:51 AM. If you have any questions, ask your nurse or doctor.        Accu-Chek Aviva Plus w/Device Kit Check blood sugar once daily as directed E11.59  Acc-Chek guide   Accu-Chek FastClix Lancets Misc Check blood sugar once daily as directed E11.59   amLODipine 10 MG tablet Commonly known as: NORVASC Take 0.5 tablets (5 mg total) by mouth daily.   apixaban 5 MG Tabs tablet Commonly known as: Eliquis Take 1 tablet (5 mg total) by mouth 2 (two) times daily.   atorvastatin 40 MG tablet Commonly known as: LIPITOR TAKE 1 TABLET ONCE DAILY   FOR CHOLESTEROL   carvedilol 25 MG tablet Commonly known as: COREG TAKE 1 TABLET TWICE DAILY  WITH MEALS   glucose blood test strip Commonly known as: Accu-Chek Guide Use as instructed Check blood sugar  once daily as directed E11.59   Iron 325 (65 Fe) MG Tabs Take by mouth daily.   losartan 100 MG tablet Commonly known as: COZAAR TAKE 1 TABLET ONCE DAILY TOCONTROL BLOOD PRESSURE. Please keep upcoming appt in August with Dr. Caryl Comes for future refills. Thank you   metFORMIN 1000 MG tablet Commonly known as: GLUCOPHAGE Take 1,000 mg by mouth daily with breakfast.   rOPINIRole 0.25 MG tablet Commonly known as: Requip Take 2 tablets (0.5 mg total) by mouth at bedtime. For restless legs   traMADol 50 MG tablet Commonly known as: ULTRAM TAKE 1 TABLET BY MOUTH TWICE A DAY AS NEEDED         Physical Exam: Vitals:   05/05/19 0910  BP: 138/84  Pulse: 63  Temp: 97.7 F (36.5 C)  TempSrc: Oral  SpO2: 95%  Weight: 182 lb 6.4 oz (82.7 kg)  Height: 5' 6" (1.676 m)   Body mass index is 29.44 kg/m. Wt Readings from Last 3 Encounters:  05/05/19 182 lb 6.4 oz (82.7 kg)  05/05/19 182 lb 5.1 oz (82.7 kg)  05/01/19 174 lb (78.9 kg)    Physical Exam Constitutional:      Appearance: Normal appearance. She is well-developed.  HENT:     Head: Normocephalic and atraumatic.     Right Ear: Tympanic membrane and external ear normal.     Left Ear: Tympanic membrane and external ear normal.  Nose: Nose normal.     Mouth/Throat:     Mouth: Mucous membranes are moist.     Pharynx: No oropharyngeal exudate.  Eyes:     General: No scleral icterus.    Pupils: Pupils are equal, round, and reactive to light.  Neck:     Musculoskeletal: Normal range of motion and neck supple.     Thyroid: No thyromegaly.     Vascular: No carotid bruit.     Trachea: No tracheal deviation.  Cardiovascular:     Rate and Rhythm: Normal rate and regular rhythm.     Heart sounds: Murmur (1/6 SEM) present. No friction rub. No gallop.      Comments: +1 ptting LE edema b/l. no calf TTP. Varicose veins b/l in LE Pulmonary:     Effort: Pulmonary effort is normal. No respiratory distress.     Breath sounds:  Normal breath sounds. No stridor. No wheezing or rales.  Chest:     Comments: BY GYN Abdominal:     General: Bowel sounds are normal. There is no distension.     Palpations: Abdomen is soft. Abdomen is not rigid. There is no hepatomegaly or mass.     Tenderness: There is no abdominal tenderness. There is no guarding or rebound.     Hernia: No hernia is present.     Comments: obese  Genitourinary:    Comments: By GYN Musculoskeletal: Normal range of motion.     Right lower leg: No edema.     Left lower leg: No edema.  Lymphadenopathy:     Cervical: No cervical adenopathy.  Skin:    General: Skin is warm and dry.     Findings: No rash.  Neurological:     General: No focal deficit present.     Mental Status: She is alert and oriented to person, place, and time.  Psychiatric:        Behavior: Behavior normal.        Thought Content: Thought content normal.        Judgment: Judgment normal.     Labs reviewed: Basic Metabolic Panel: Recent Labs    11/04/18 0921  NA 140  K 3.9  CL 107  CO2 23  GLUCOSE 74  BUN 24  CREATININE 1.07*  CALCIUM 9.3   Liver Function Tests: Recent Labs    11/04/18 0921  AST 24  ALT 20  BILITOT 0.5  PROT 7.2   No results for input(s): LIPASE, AMYLASE in the last 8760 hours. No results for input(s): AMMONIA in the last 8760 hours. CBC: Recent Labs    11/04/18 0921 05/01/19 0856  WBC 3.7* 5.3  NEUTROABS 1,447*  --   HGB 11.2* 12.0  HCT 33.4* 34.5  MCV 93.3 94  PLT 206 224   Lipid Panel: Recent Labs    11/04/18 0921  CHOL 189  HDL 92  LDLCALC 84  TRIG 52  CHOLHDL 2.1   Lab Results  Component Value Date   HGBA1C 6.4 (H) 11/04/2018    Procedures: No results found.  Assessment/Plan 1. Need for influenza vaccination - Flu Vaccine QUAD High Dose(Fluad)  2. Type 2 diabetes mellitus without complication, without long-term current use of insulin (HCC) -continues on metformin 1000 mg by mouth daily, has changed diet recent  to get back on track. Encouraged dietary compliance, routine foot care/monitoring and to keep up with diabetic eye exams through ophthalmology  - Hemoglobin A1c  3. Anemia, chronic disease Stable on recent labs, continues on  iron supplement  4. Hyperlipidemia associated with type 2 diabetes mellitus (HCC) Has made dietary changes recently. Continues on lipitor 40 mg daily - Lipid panel - BMP with eGFR([facility])  5. Restless leg syndrome Stable on requip 1 tablet daily at bedtime - rOPINIRole (REQUIP) 0.25 MG tablet; Take 1 tablet (0.25 mg total) by mouth at bedtime. For restless legs  Dispense: 90 tablet; Refill: 1  6. HYPERTENSION, BENIGN SYSTEMIC Controlled on losartan, coreg and norvasc - BMP with eGFR([facility])  7. Bilateral edema of lower extremity Stable at this time.   8. CKD stage 3 due to type 2 diabetes mellitus (HCC) Encourage proper hydration and to avoid NSAIDS (Aleve, Advil, Motrin, Ibuprofen)   9. Osteoarthritis, unspecified osteoarthritis type, unspecified site Stable at this time. Uses tramadol as needed and mostly when she is working.  10. Paroxysmal atrial fibrillation (HCC) -rate controlled on coreg and continues on eliquis for anticoagulation. Continues to follow up with cardiologist.  11. Preventative Care Doing well. Has recently made changes to diet and plans to work on physical activity. Has preventative services scheduled over the next few months. COVID has gotten her slightly behind. The patient was counseled regarding the appropriate use of alcohol, regular self-examination of the breasts on a monthly basis, prevention of dental and periodontal disease, diet, regular sustained exercise for at least 30 minutes 5 times per week, routine screening interval for mammogram as recommended by the American Cancer Society and ACOG, importance of regular PAP smears,  and recommended schedule for GI hemoccult testing, colonoscopy, cholesterol, thyroid and diabetes screening.    Next appt:   K. , AGNP  [facility] [phone]  

## 2019-05-05 NOTE — Patient Instructions (Signed)
Increase physical active to 30 mins/5 days week Continue working on cutting back on pastas     DASH Eating Plan DASH stands for "Dietary Approaches to Stop Hypertension." The DASH eating plan is a healthy eating plan that has been shown to reduce high blood pressure (hypertension). It may also reduce your risk for type 2 diabetes, heart disease, and stroke. The DASH eating plan may also help with weight loss. What are tips for following this plan?  General guidelines  Avoid eating more than 2,300 mg (milligrams) of salt (sodium) a day. If you have hypertension, you may need to reduce your sodium intake to 1,500 mg a day.  Limit alcohol intake to no more than 1 drink a day for nonpregnant women and 2 drinks a day for men. One drink equals 12 oz of beer, 5 oz of wine, or 1 oz of hard liquor.  Work with your health care provider to maintain a healthy body weight or to lose weight. Ask what an ideal weight is for you.  Get at least 30 minutes of exercise that causes your heart to beat faster (aerobic exercise) most days of the week. Activities may include walking, swimming, or biking.  Work with your health care provider or diet and nutrition specialist (dietitian) to adjust your eating plan to your individual calorie needs. Reading food labels   Check food labels for the amount of sodium per serving. Choose foods with less than 5 percent of the Daily Value of sodium. Generally, foods with less than 300 mg of sodium per serving fit into this eating plan.  To find whole grains, look for the word "whole" as the first word in the ingredient list. Shopping  Buy products labeled as "low-sodium" or "no salt added."  Buy fresh foods. Avoid canned foods and premade or frozen meals. Cooking  Avoid adding salt when cooking. Use salt-free seasonings or herbs instead of table salt or sea salt. Check with your health care provider or pharmacist before using salt substitutes.  Do not fry foods.  Cook foods using healthy methods such as baking, boiling, grilling, and broiling instead.  Cook with heart-healthy oils, such as olive, canola, soybean, or sunflower oil. Meal planning  Eat a balanced diet that includes: ? 5 or more servings of fruits and vegetables each day. At each meal, try to fill half of your plate with fruits and vegetables. ? Up to 6-8 servings of whole grains each day. ? Less than 6 oz of lean meat, poultry, or fish each day. A 3-oz serving of meat is about the same size as a deck of cards. One egg equals 1 oz. ? 2 servings of low-fat dairy each day. ? A serving of nuts, seeds, or beans 5 times each week. ? Heart-healthy fats. Healthy fats called Omega-3 fatty acids are found in foods such as flaxseeds and coldwater fish, like sardines, salmon, and mackerel.  Limit how much you eat of the following: ? Canned or prepackaged foods. ? Food that is high in trans fat, such as fried foods. ? Food that is high in saturated fat, such as fatty meat. ? Sweets, desserts, sugary drinks, and other foods with added sugar. ? Full-fat dairy products.  Do not salt foods before eating.  Try to eat at least 2 vegetarian meals each week.  Eat more home-cooked food and less restaurant, buffet, and fast food.  When eating at a restaurant, ask that your food be prepared with less salt or no salt, if  possible. What foods are recommended? The items listed may not be a complete list. Talk with your dietitian about what dietary choices are best for you. Grains Whole-grain or whole-wheat bread. Whole-grain or whole-wheat pasta. Brown rice. [place]. Bulgur. Whole-grain and low-sodium cereals. Pita bread. Low-fat, low-sodium crackers. Whole-wheat flour tortillas. Vegetables Fresh or frozen vegetables (raw, steamed, roasted, or grilled). Low-sodium or reduced-sodium tomato and vegetable juice. Low-sodium or reduced-sodium tomato sauce and tomato paste. Low-sodium or reduced-sodium  canned vegetables. Fruits All fresh, dried, or frozen fruit. Canned fruit in natural juice (without added sugar). Meat and other protein foods Skinless chicken or [place]. Ground chicken or [place]. Pork with fat trimmed off. Fish and seafood. Egg whites. Dried beans, peas, or lentils. Unsalted nuts, nut butters, and seeds. Unsalted canned beans. Lean cuts of beef with fat trimmed off. Low-sodium, lean deli meat. Dairy Low-fat (1%) or fat-free (skim) milk. Fat-free, low-fat, or reduced-fat cheeses. Nonfat, low-sodium ricotta or cottage cheese. Low-fat or nonfat yogurt. Low-fat, low-sodium cheese. Fats and oils Soft margarine without trans fats. Vegetable oil. Low-fat, reduced-fat, or light mayonnaise and salad dressings (reduced-sodium). Canola, safflower, olive, soybean, and sunflower oils. Avocado. Seasoning and other foods Herbs. Spices. Seasoning mixes without salt. Unsalted popcorn and pretzels. Fat-free sweets. What foods are not recommended? The items listed may not be a complete list. Talk with your dietitian about what dietary choices are best for you. Grains Baked goods made with fat, such as croissants, muffins, or some breads. Dry pasta or rice meal packs. Vegetables Creamed or fried vegetables. Vegetables in a cheese sauce. Regular canned vegetables (not low-sodium or reduced-sodium). Regular canned tomato sauce and paste (not low-sodium or reduced-sodium). Regular tomato and vegetable juice (not low-sodium or reduced-sodium). Angie Fava. Olives. Fruits Canned fruit in a light or heavy syrup. Fried fruit. Fruit in cream or butter sauce. Meat and other protein foods Fatty cuts of meat. Ribs. Fried meat. Berniece Salines. Sausage. Bologna and other processed lunch meats. Salami. Fatback. Hotdogs. Bratwurst. Salted nuts and seeds. Canned beans with added salt. Canned or smoked fish. Whole eggs or egg yolks. Chicken or [place] with skin. Dairy Whole or 2% milk, cream, and half-and-half. Whole or  full-fat cream cheese. Whole-fat or sweetened yogurt. Full-fat cheese. Nondairy creamers. Whipped toppings. Processed cheese and cheese spreads. Fats and oils Butter. Stick margarine. Lard. Shortening. Ghee. Bacon fat. Tropical oils, such as coconut, palm kernel, or palm oil. Seasoning and other foods Salted popcorn and pretzels. Onion salt, garlic salt, seasoned salt, table salt, and sea salt. Worcestershire sauce. Tartar sauce. Barbecue sauce. Teriyaki sauce. Soy sauce, including reduced-sodium. Steak sauce. Canned and packaged gravies. Fish sauce. Oyster sauce. Cocktail sauce. Horseradish that you find on the shelf. Ketchup. Mustard. Meat flavorings and tenderizers. Bouillon cubes. Hot sauce and Tabasco sauce. Premade or packaged marinades. Premade or packaged taco seasonings. Relishes. Regular salad dressings. Where to find more information:  National Heart, Lung, and [place]: https://wilson-eaton.com/  American Heart Association: www.heart.org Summary  The DASH eating plan is a healthy eating plan that has been shown to reduce high blood pressure (hypertension). It may also reduce your risk for type 2 diabetes, heart disease, and stroke.  With the DASH eating plan, you should limit salt (sodium) intake to 2,300 mg a day. If you have hypertension, you may need to reduce your sodium intake to 1,500 mg a day.  When on the DASH eating plan, aim to eat more fresh fruits and vegetables, whole grains, lean proteins, low-fat dairy, and heart-healthy fats.  Work with your health care provider or diet and nutrition specialist (dietitian) to adjust your eating plan to your individual calorie needs. This information is not intended to replace advice given to you by your health care provider. Make sure you discuss any questions you have with your health care provider. Document Released: 08/13/2011 Document Revised: 08/06/2017 Document Reviewed: 08/17/2016 Elsevier Patient Education  2020 [employer].

## 2019-05-05 NOTE — Patient Instructions (Signed)
[name] , Thank you for taking time to come for your Medicare Wellness Visit. I appreciate your ongoing commitment to your health goals. Please review the following plan we discussed and let me know if I can assist you in the future.   Screening recommendations/referrals: Colonoscopy up to date Mammogram up todate Bone Density up todate Recommended yearly ophthalmology/optometry visit for glaucoma screening and checkup Recommended yearly dental visit for hygiene and checkup  Vaccinations: Influenza vaccine GIVEN TODAY Pneumococcal vaccine up todate Tdap vaccine up to date Shingles vaccine- request shingles vaccine at pharmacy    Advanced directives: complete advance directive and bring to office to place on file  Conditions/risks identified: to increase physical activity  Next appointment: 1 year   Preventive Care 70 Years and Older, Female Preventive care refers to lifestyle choices and visits with your health care provider that can promote health and wellness. What does preventive care include?  A yearly physical exam. This is also called an annual well check.  Dental exams once or twice a year.  Routine eye exams. Ask your health care provider how often you should have your eyes checked.  Personal lifestyle choices, including:  Daily care of your teeth and gums.  Regular physical activity.  Eating a healthy diet.  Avoiding tobacco and drug use.  Limiting alcohol use.  Practicing safe sex.  Taking low-dose aspirin every day.  Taking vitamin and mineral supplements as recommended by your health care provider. What happens during an annual well check? The services and screenings done by your health care provider during your annual well check will depend on your age, overall health, lifestyle risk factors, and family history of disease. Counseling  Your health care provider may ask you questions about your:  Alcohol use.  Tobacco use.  Drug use.   Emotional well-being.  Home and relationship well-being.  Sexual activity.  Eating habits.  History of falls.  Memory and ability to understand (cognition).  Work and work Statistician.  Reproductive health. Screening  You may have the following tests or measurements:  Height, weight, and BMI.  Blood pressure.  Lipid and cholesterol levels. These may be checked every 5 years, or more frequently if you are over 44 years old.  Skin check.  Lung cancer screening. You may have this screening every year starting at age 36 if you have a 30-pack-year history of smoking and currently smoke or have quit within the past 15 years.  Fecal occult blood test (FOBT) of the stool. You may have this test every year starting at age 36.  Flexible sigmoidoscopy or colonoscopy. You may have a sigmoidoscopy every 5 years or a colonoscopy every 10 years starting at age 15.  Hepatitis C blood test.  Hepatitis B blood test.  Sexually transmitted disease (STD) testing.  Diabetes screening. This is done by checking your blood sugar (glucose) after you have not eaten for a while (fasting). You may have this done every 1-3 years.  Bone density scan. This is done to screen for osteoporosis. You may have this done starting at age 81.  Mammogram. This may be done every 1-2 years. Talk to your health care provider about how often you should have regular mammograms. Talk with your health care provider about your test results, treatment options, and if necessary, the need for more tests. Vaccines  Your health care provider may recommend certain vaccines, such as:  Influenza vaccine. This is recommended every year.  Tetanus, diphtheria, and acellular pertussis (Tdap, Td) vaccine.  You may need a Td booster every 10 years.  Zoster vaccine. You may need this after age 21.  Pneumococcal 13-valent conjugate (PCV13) vaccine. One dose is recommended after age 50.  Pneumococcal polysaccharide (PPSV23)  vaccine. One dose is recommended after age 20. Talk to your health care provider about which screenings and vaccines you need and how often you need them. This information is not intended to replace advice given to you by your health care provider. Make sure you discuss any questions you have with your health care provider. Document Released: 09/20/2015 Document Revised: 05/13/2016 Document Reviewed: 06/25/2015 Elsevier Interactive Patient Education  2017 [place] Prevention in the Home Falls can cause injuries. They can happen to people of all ages. There are many things you can do to make your home safe and to help prevent falls. What can I do on the outside of my home?  Regularly fix the edges of walkways and driveways and fix any cracks.  Remove anything that might make you trip as you walk through a door, such as a raised step or threshold.  Trim any bushes or trees on the path to your home.  Use bright outdoor lighting.  Clear any walking paths of anything that might make someone trip, such as rocks or tools.  Regularly check to see if handrails are loose or broken. Make sure that both sides of any steps have handrails.  Any raised decks and porches should have guardrails on the edges.  Have any leaves, snow, or ice cleared regularly.  Use sand or salt on walking paths during winter.  Clean up any spills in your garage right away. This includes oil or grease spills. What can I do in the bathroom?  Use night lights.  Install grab bars by the toilet and in the tub and shower. Do not use towel bars as grab bars.  Use non-skid mats or decals in the tub or shower.  If you need to sit down in the shower, use a plastic, non-slip stool.  Keep the floor dry. Clean up any water that spills on the floor as soon as it happens.  Remove soap buildup in the tub or shower regularly.  Attach bath mats securely with double-sided non-slip rug tape.  Do not have throw rugs  and other things on the floor that can make you trip. What can I do in the bedroom?  Use night lights.  Make sure that you have a light by your bed that is easy to reach.  Do not use any sheets or blankets that are too big for your bed. They should not hang down onto the floor.  Have a firm chair that has side arms. You can use this for support while you get dressed.  Do not have throw rugs and other things on the floor that can make you trip. What can I do in the kitchen?  Clean up any spills right away.  Avoid walking on wet floors.  Keep items that you use a lot in easy-to-reach places.  If you need to reach something above you, use a strong step stool that has a grab bar.  Keep electrical cords out of the way.  Do not use floor polish or wax that makes floors slippery. If you must use wax, use non-skid floor wax.  Do not have throw rugs and other things on the floor that can make you trip. What can I do with my stairs?  Do not leave any  items on the stairs.  Make sure that there are handrails on both sides of the stairs and use them. Fix handrails that are broken or loose. Make sure that handrails are as long as the stairways.  Check any carpeting to make sure that it is firmly attached to the stairs. Fix any carpet that is loose or worn.  Avoid having throw rugs at the top or bottom of the stairs. If you do have throw rugs, attach them to the floor with carpet tape.  Make sure that you have a light switch at the top of the stairs and the bottom of the stairs. If you do not have them, ask someone to add them for you. What else can I do to help prevent falls?  Wear shoes that:  Do not have high heels.  Have rubber bottoms.  Are comfortable and fit you well.  Are closed at the toe. Do not wear sandals.  If you use a stepladder:  Make sure that it is fully opened. Do not climb a closed stepladder.  Make sure that both sides of the stepladder are locked into  place.  Ask someone to hold it for you, if possible.  Clearly mark and make sure that you can see:  Any grab bars or handrails.  First and last steps.  Where the edge of each step is.  Use tools that help you move around (mobility aids) if they are needed. These include:  Canes.  Walkers.  Scooters.  Crutches.  Turn on the lights when you go into a dark area. Replace any light bulbs as soon as they burn out.  Set up your furniture so you have a clear path. Avoid moving your furniture around.  If any of your floors are uneven, fix them.  If there are any pets around you, be aware of where they are.  Review your medicines with your doctor. Some medicines can make you feel dizzy. This can increase your chance of falling. Ask your doctor what other things that you can do to help prevent falls. This information is not intended to replace advice given to you by your health care provider. Make sure you discuss any questions you have with your health care provider. Document Released: 06/20/2009 Document Revised: 01/30/2016 Document Reviewed: 09/28/2014 Elsevier Interactive Patient Education  2017 [employer].

## 2019-05-06 LAB — LIPID PANEL
Cholesterol: 200 mg/dL — ABNORMAL HIGH (ref <200)
HDL: 88 mg/dL (ref >=50)
LDL Cholesterol (Calc): 99 mg/dL (calc)
Non-HDL Cholesterol (Calc): 112 mg/dL (calc) (ref <130)
Total CHOL/HDL Ratio: 2.3 (calc) (ref <5.0)
Triglycerides: 52 mg/dL (ref <150)

## 2019-05-06 LAB — BASIC METABOLIC PANEL WITH GFR
BUN/Creatinine Ratio: 26 (calc) — ABNORMAL HIGH (ref 6–22)
BUN: 28 mg/dL — ABNORMAL HIGH (ref 7–25)
CO2: 25 mmol/L (ref 20–32)
Calcium: 9.3 mg/dL (ref 8.6–10.4)
Chloride: 108 mmol/L (ref 98–110)
Creat: 1.06 mg/dL — ABNORMAL HIGH (ref 0.60–0.93)
GFR, Est African American: 62 mL/min/{1.73_m2} (ref >=60)
GFR, Est Non African American: 53 mL/min/{1.73_m2} — ABNORMAL LOW (ref >=60)
Glucose, Bld: 94 mg/dL (ref 65–99)
Potassium: 3.9 mmol/L (ref 3.5–5.3)
Sodium: 141 mmol/L (ref 135–146)

## 2019-05-06 LAB — HEMOGLOBIN A1C
Hgb A1c MFr Bld: 6.4 % of total Hgb — ABNORMAL HIGH (ref <5.7)
Mean Plasma Glucose: 137 (calc)
eAG (mmol/L): 7.6 (calc)

## 2019-05-19 LAB — HM DIABETES EYE EXAM

## 2019-06-01 ENCOUNTER — Other Ambulatory Visit: Payer: Self-pay | Admitting: *Deleted

## 2019-06-01 MED ORDER — AMLODIPINE BESYLATE 10 MG PO TABS: 5.0000 mg | ORAL_TABLET | Freq: Every day | ORAL | 1 refills | Status: DC

## 2019-06-01 NOTE — Telephone Encounter (Signed)
[facility]

## 2019-06-15 ENCOUNTER — Other Ambulatory Visit: Payer: Self-pay | Admitting: Internal Medicine

## 2019-06-15 ENCOUNTER — Other Ambulatory Visit: Payer: Self-pay | Admitting: Nurse Practitioner

## 2019-06-15 MED ORDER — LOSARTAN POTASSIUM 100 MG PO TABS: ORAL_TABLET | ORAL | 3 refills | Status: DC

## 2019-06-17 ENCOUNTER — Other Ambulatory Visit: Payer: Self-pay | Admitting: Nurse Practitioner

## 2019-06-17 DIAGNOSIS — M1990 Unspecified osteoarthritis, unspecified site: Secondary | ICD-10-CM

## 2019-06-19 NOTE — Telephone Encounter (Signed)
Last filled in epic 04/24/2019

## 2019-06-30 NOTE — Addendum Note (Signed)
Addended by: Lauree Chandler on: 06/30/2019 04:18 PM   Modules accepted: Level of Service

## 2019-07-05 ENCOUNTER — Other Ambulatory Visit: Payer: Self-pay

## 2019-07-05 DIAGNOSIS — Z20822 Contact with and (suspected) exposure to covid-19: Secondary | ICD-10-CM

## 2019-07-06 LAB — NOVEL CORONAVIRUS, NAA: SARS-CoV-2, NAA: NOT DETECTED

## 2019-07-09 HISTORY — PX: CATARACT EXTRACTION: SUR2

## 2019-07-14 ENCOUNTER — Ambulatory Visit (INDEPENDENT_AMBULATORY_CARE_PROVIDER_SITE_OTHER): Payer: 59 | Admitting: Family

## 2019-07-14 ENCOUNTER — Encounter: Payer: Self-pay | Admitting: Family

## 2019-07-14 ENCOUNTER — Other Ambulatory Visit: Payer: Self-pay

## 2019-07-14 VITALS — BP 151/83 | Temp 98.2°F

## 2019-07-14 DIAGNOSIS — Z20828 Contact with and (suspected) exposure to other viral communicable diseases: Secondary | ICD-10-CM

## 2019-07-14 DIAGNOSIS — Z20822 Contact with and (suspected) exposure to covid-19: Secondary | ICD-10-CM

## 2019-07-14 DIAGNOSIS — Z7689 Persons encountering health services in other specified circumstances: Secondary | ICD-10-CM

## 2019-07-14 NOTE — Patient Instructions (Signed)
May return to work on 07/17/2019 if symptoms free for three days.

## 2019-07-14 NOTE — Progress Notes (Signed)
This service is provided via telemedicine  No vital signs collected/recorded due to the encounter was a telemedicine visit.   Location of patient (ex: home, work):  Home   Patient consents to a telephone visit:  yes   Location of the provider (ex: office, home):  Office   Name of any referring provider:  Sherrie Mustache, NP   Names of all persons participating in the telemedicine service and their role in the encounter:  Marlowe Sax, NP, Ruthell Rummage CMA, and Garey Ham   Time spent on call:  Ruthell Rummage CMA spent 8 minutes on phone with patient.   Location:      Place of Service:    Provider: Demetrios Byron FNP-C  Lauree Chandler, NP  Patient Care Team: Lauree Chandler, NP as PCP - General (Geriatric Medicine) Katherine Mantle, OD [facility])  Extended Emergency Contact Information Primary Emergency Contact: [place] Address: [address]          [place],  [place] [address] [place] Phone: [phone] Relation: Daughter  Code Status:  Full Code  Goals of care: Advanced Directive information Advanced Directives 05/05/2019  Does Patient Have a Medical Advance Directive? No  Would patient like information on creating a medical advance directive? No - Patient declined     Chief Complaint  Patient presents with  . Acute Visit    Patient needs work note     HPI:  Pt is a 68 y.o. female seen today for an acute visit for follow up contact with Granddaughter who was positive with COVID-19.she was tested for COVID-19 on 07/05/2019 and no virus was detected.she has since self Quarantine her self.she has had no symptoms such as cough,shortness of breath,chest pain,chest tightness,fever,chills,body aches,fatigue or loss of smell or taste.she states would like a letter written for her to return to work.    Past Medical History:  Diagnosis Date  . Anxiety   . Arthritis    osteoarthritis  . Breast cancer ([place]) 2008   right,  ER/PR -  . CHF (congestive heart failure) ([place])   . Depression   . Diabetes mellitus    TYPE 2  . Gastroparesis   . Hx of radiation therapy 04/04/07 to 05/20/07   right breast/6260 cGy  . Hypercholesterolemia   . Hypertension   . Lumbago   . Malignant neoplasm of breast (female), unspecified site   . Nonspecific elevation of levels of transaminase or lactic acid dehydrogenase (LDH)   . Osteoarthrosis, unspecified whether generalized or localized, unspecified site   . Regional enteritis of unspecified site   . Restless legs syndrome (RLS)   . TIA (transient ischemic attack)   . Urinary frequency   . Uterine prolapse without mention of vaginal wall prolapse   . Vitamin deficiency    Past Surgical History:  Procedure Laterality Date  . ABDOMINAL HYSTERECTOMY  1997   TAH/BSO, ENDOMETRIAL SARCOMA  . ARTHROPLASTY     left knee  . BREAST LUMPECTOMY Right 11/17/06   re-excision 01/13/07  . RADICAL HYSTERECTOMY  1997  . TOTAL KNEE ARTHROPLASTY Left 06/10/2011      . TOTAL KNEE ARTHROPLASTY Right 2006, 2009   Dr Para March  . TOTAL KNEE ARTHROPLASTY Left 2008   Dr Para March  . WRIST SURGERY     carpel tunnel    Allergies  Allergen Reactions  . Hydrocodone Nausea And Vomiting  . Lisinopril     Outpatient Encounter Medications as of 07/14/2019  Medication Sig  . Accu-Chek [employer]  Lancets MISC Check blood sugar once daily as directed E11.59  . amLODipine (NORVASC) 10 MG tablet Take 0.5 tablets (5 mg total) by mouth daily.  [name] Kitchen apixaban (ELIQUIS) 5 MG TABS tablet Take 1 tablet (5 mg total) by mouth 2 (two) times daily.  [name] Kitchen atorvastatin (LIPITOR) 40 MG tablet TAKE 1 TABLET DAILY FOR    CHOLESTEROL  . Blood Glucose Monitoring Suppl (ACCU-CHEK AVIVA PLUS) w/Device KIT Check blood sugar once daily as directed E11.59  Acc-Chek guide  . carvedilol (COREG) 25 MG tablet TAKE 1 TABLET TWICE DAILY  WITH MEALS  . Ferrous Sulfate (IRON) 325 (65 Fe) MG TABS Take by mouth daily.  [name] Kitchen glucose blood  (ACCU-CHEK GUIDE) test strip Use as instructed Check blood sugar once daily as directed E11.59  . losartan (COZAAR) 100 MG tablet TAKE 1 TABLET BY MOUTH DAILY TO CONTROL BLOOD PRESSURE.  . metFORMIN (GLUCOPHAGE) 1000 MG tablet Take 1,000 mg by mouth daily with breakfast.  . rOPINIRole (REQUIP) 0.25 MG tablet Take 1 tablet (0.25 mg total) by mouth at bedtime. For restless legs  . traMADol (ULTRAM) 50 MG tablet TAKE 1 TABLET BY MOUTH TWICE A DAY AS NEEDED   No facility-administered encounter medications on file as of 07/14/2019.     Review of Systems  Constitutional: Negative for appetite change, chills, fatigue and fever.  HENT: Negative for congestion, postnasal drip, rhinorrhea, sinus pressure, sinus pain, sneezing and sore throat.   Eyes: Negative for discharge, redness and itching.  Respiratory: Negative for cough, chest tightness, shortness of breath and wheezing.   Cardiovascular: Negative for chest pain, palpitations and leg swelling.  Gastrointestinal: Negative for abdominal distention, abdominal pain, constipation, diarrhea, nausea and vomiting.  Genitourinary: Negative for difficulty urinating, dysuria, flank pain, frequency and urgency.  Musculoskeletal: Positive for arthralgias.  Skin: Negative for color change, pallor and rash.  Neurological: Negative for dizziness, weakness, light-headedness, numbness and headaches.  Hematological: Does not bruise/bleed easily.  Psychiatric/Behavioral: Negative for agitation and sleep disturbance. The patient is not nervous/anxious.     Immunization History  Administered Date(s) Administered  . Fluad Quad(high Dose 65+) 05/05/2019  . Influenza, High Dose Seasonal PF 06/04/2017, 05/24/2018  . Influenza,inj,Quad PF,6+ Mos 07/05/2015  . Influenza-Unspecified 07/05/2013, 06/07/2014, 07/08/2016  . Pneumococcal Conjugate-13 03/07/2014  . Pneumococcal Polysaccharide-23 06/08/1999, 11/04/2018  . Td 09/08/2003  . Tdap 11/29/2013  . Zoster  08/08/2013  . Zoster Recombinat (Shingrix) 05/07/2019   Pertinent  Health Maintenance Due  Topic Date Due  . OPHTHALMOLOGY EXAM  04/29/2019  . HEMOGLOBIN A1C  11/05/2019  . FOOT EXAM  05/04/2020  . MAMMOGRAM  02/23/2021  . COLONOSCOPY  01/17/2025  . INFLUENZA VACCINE  Completed  . DEXA SCAN  Completed  . PNA vac Low Risk Adult  Completed   Fall Risk  07/14/2019 05/05/2019 11/04/2018 04/29/2018 10/15/2017  Falls in the past year? 0 0 0 No No  Number falls in past yr: 0 - 0 - -  Injury with Fall? 0 - 0 - -    Vitals:   07/14/19 1530  BP: (!) 151/83  Temp: 98.2 F (36.8 C)  TempSrc: Temporal   There is no height or weight on file to calculate BMI. Physical Exam  Unable to complete on telephone visit.   Labs reviewed: Recent Labs    11/04/18 0921 05/05/19 1006  NA 140 141  K 3.9 3.9  CL 107 108  CO2 23 25  GLUCOSE 74 94  BUN 24 28*  CREATININE 1.07* 1.06*  CALCIUM 9.3 9.3   Recent Labs    11/04/18 0921  AST 24  ALT 20  BILITOT 0.5  PROT 7.2   Recent Labs    11/04/18 0921 05/01/19 0856  WBC 3.7* 5.3  NEUTROABS 1,447*  --   HGB 11.2* 12.0  HCT 33.4* 34.5  MCV 93.3 94  PLT 206 224   Lab Results  Component Value Date   TSH 1.49 04/29/2018   Lab Results  Component Value Date   HGBA1C 6.4 (H) 05/05/2019   Lab Results  Component Value Date   CHOL 200 (H) 05/05/2019   HDL 88 05/05/2019   LDLCALC 99 05/05/2019   LDLDIRECT 75 01/26/2011   TRIG 52 05/05/2019   CHOLHDL 2.3 05/05/2019    Significant Diagnostic Results in last 30 days:  No results found.  Assessment/Plan 1. Return to work evaluation May return to work on 07/17/2019.Has been asymptomatic for COVID-19 symptoms.Will write letter for patient to pick up from [facility] office.   2. Close exposure to COVID-19 virus COVID-19 test was negative 07/05/2019 after exposure to Granddaughter who was in contact with the Dad positive for COVID-19.She has been asymptomatic. May now return to work. -  encouraged to follow CDC guidelines for social distancing,wearing facial mask and practice hand hygiene.   Family/ staff Communication: Reviewed plan of care with patient.  Labs/tests ordered: None  Spent 11 minutes of non-face to face with patient    Sandrea Hughs, NP

## 2019-07-25 ENCOUNTER — Other Ambulatory Visit: Payer: Self-pay | Admitting: *Deleted

## 2019-07-25 MED ORDER — CARVEDILOL 25 MG PO TABS: 25.0000 mg | ORAL_TABLET | Freq: Two times a day (BID) | ORAL | 1 refills | Status: DC

## 2019-07-25 NOTE — Telephone Encounter (Signed)
[facility]

## 2019-08-08 HISTORY — PX: CATARACT EXTRACTION: SUR2

## 2019-08-09 ENCOUNTER — Other Ambulatory Visit: Payer: Self-pay | Admitting: Nurse Practitioner

## 2019-08-09 DIAGNOSIS — M1990 Unspecified osteoarthritis, unspecified site: Secondary | ICD-10-CM

## 2019-08-10 NOTE — Telephone Encounter (Signed)
Please make sure appt is made- this needs to be done before refill can be provided.

## 2019-08-10 NOTE — Telephone Encounter (Signed)
Patient does not have a non-opioid contract on file.  Called and left VM to call the office to schedule.  Last refill 06/19/19, #60.  LOV 07/14/19 with Dinah for clearance to return to work.

## 2019-08-11 ENCOUNTER — Encounter: Payer: Self-pay | Admitting: *Deleted

## 2019-08-11 NOTE — Telephone Encounter (Signed)
Needing appt for pain contract to be updated.

## 2019-08-11 NOTE — Telephone Encounter (Signed)
Appointment scheduled for 08/18/2019 With Dinah

## 2019-08-11 NOTE — Telephone Encounter (Signed)
error 

## 2019-08-18 ENCOUNTER — Encounter: Payer: Self-pay | Admitting: Family

## 2019-08-18 ENCOUNTER — Ambulatory Visit (INDEPENDENT_AMBULATORY_CARE_PROVIDER_SITE_OTHER): Payer: 59 | Admitting: Family

## 2019-08-18 ENCOUNTER — Other Ambulatory Visit: Payer: Self-pay

## 2019-08-18 VITALS — BP 118/78 | HR 67 | Temp 97.1°F | Ht 66.0 in | Wt 192.2 lb

## 2019-08-18 DIAGNOSIS — M25561 Pain in right knee: Secondary | ICD-10-CM

## 2019-08-18 DIAGNOSIS — M25562 Pain in left knee: Secondary | ICD-10-CM | POA: Diagnosis not present

## 2019-08-18 DIAGNOSIS — M1990 Unspecified osteoarthritis, unspecified site: Secondary | ICD-10-CM

## 2019-08-18 DIAGNOSIS — G8929 Other chronic pain: Secondary | ICD-10-CM

## 2019-08-18 MED ORDER — BIOFREEZE 4 % EX GEL: 3.0000 [oz_av] | Freq: Three times a day (TID) | CUTANEOUS | 3 refills | Status: AC | PRN

## 2019-08-18 MED ORDER — TRAMADOL HCL 50 MG PO TABS: 50.0000 mg | ORAL_TABLET | Freq: Two times a day (BID) | ORAL | 0 refills | Status: DC | PRN

## 2019-08-18 NOTE — Progress Notes (Signed)
Provider: Brittaney Beaulieu FNP-C  Lauree Chandler, NP  Patient Care Team: Lauree Chandler, NP as PCP - General (Geriatric Medicine) Katherine Mantle, OD [facility])  Extended Emergency Contact Information Primary Emergency Contact: [place] Address: [address]          [place],  [address] [place] Phone: [phone] Relation: Daughter  Code Status: Full Code  Goals of care: Advanced Directive information Advanced Directives 05/05/2019  Does Patient Have a Medical Advance Directive? No  Would patient like information on creating a medical advance directive? No - Patient declined     Chief Complaint  Patient presents with  . Form Completion    Pain med managemtent contract     HPI:  Pt is a 68 y.o. female seen today for an acute visit for pain management.She has arthritic pain on the right hip rating 8/10 especially at night.Also has pain on both knees rating 4/10 on scale.she takes tramadol 50 mg tablet at least once a day in the afternoon prior to go to work.she has had bilateral knee replacement.Discussed with patient her average probability of overdose or serious opoid-induced respiratory depression the next six months based on her chronic condition is 55%.she has a significant medical history of congestive heart failure,chronic Kidney disease stage 3 and Hx of TIA. Patient will try applying topical analgesic to knees and take tramadol for break through pain. She takes tylenol in between her tramadol.    Past Medical History:  Diagnosis Date  . Anxiety   . Arthritis    osteoarthritis  . Breast cancer ([place]) 2008   right, ER/PR -  . CHF (congestive heart failure) ([place])   . Depression   . Diabetes mellitus    TYPE 2  . Gastroparesis   . Hx of radiation therapy 04/04/07 to 05/20/07   right breast/6260 cGy  . Hypercholesterolemia   . Hypertension   . Lumbago   . Malignant neoplasm of breast (female), unspecified site   .  Nonspecific elevation of levels of transaminase or lactic acid dehydrogenase (LDH)   . Osteoarthrosis, unspecified whether generalized or localized, unspecified site   . Regional enteritis of unspecified site   . Restless legs syndrome (RLS)   . TIA (transient ischemic attack)   . Urinary frequency   . Uterine prolapse without mention of vaginal wall prolapse   . Vitamin deficiency    Past Surgical History:  Procedure Laterality Date  . ABDOMINAL HYSTERECTOMY  1997   TAH/BSO, ENDOMETRIAL SARCOMA  . ARTHROPLASTY     left knee  . BREAST LUMPECTOMY Right 11/17/06   re-excision 01/13/07  . RADICAL HYSTERECTOMY  1997  . TOTAL KNEE ARTHROPLASTY Left 06/10/2011      . TOTAL KNEE ARTHROPLASTY Right 2006, 2009   Dr Para March  . TOTAL KNEE ARTHROPLASTY Left 2008   Dr Para March  . WRIST SURGERY     carpel tunnel    Allergies  Allergen Reactions  . Hydrocodone Nausea And Vomiting  . Lisinopril     Outpatient Encounter Medications as of 08/18/2019  Medication Sig  . Accu-Chek FastClix Lancets MISC Check blood sugar once daily as directed E11.59  . amLODipine (NORVASC) 10 MG tablet Take 0.5 tablets (5 mg total) by mouth daily.  [name] Kitchen apixaban (ELIQUIS) 5 MG TABS tablet Take 1 tablet (5 mg total) by mouth 2 (two) times daily.  [name] Kitchen atorvastatin (LIPITOR) 40 MG tablet TAKE 1 TABLET DAILY FOR    CHOLESTEROL  . Blood Glucose Monitoring Suppl (ACCU-CHEK  AVIVA PLUS) w/Device KIT Check blood sugar once daily as directed E11.59  Acc-Chek guide  . carvedilol (COREG) 25 MG tablet Take 1 tablet (25 mg total) by mouth 2 (two) times daily with a meal.  . Ferrous Sulfate (IRON) 325 (65 Fe) MG TABS Take by mouth daily.  [name] Kitchen glucose blood (ACCU-CHEK GUIDE) test strip Use as instructed Check blood sugar once daily as directed E11.59  . losartan (COZAAR) 100 MG tablet TAKE 1 TABLET BY MOUTH DAILY TO CONTROL BLOOD PRESSURE.  . metFORMIN (GLUCOPHAGE) 1000 MG tablet Take 1,000 mg by mouth daily with breakfast.  . rOPINIRole  (REQUIP) 0.25 MG tablet Take 1 tablet (0.25 mg total) by mouth at bedtime. For restless legs  . traMADol (ULTRAM) 50 MG tablet TAKE 1 TABLET BY MOUTH TWICE A DAY AS NEEDED   No facility-administered encounter medications on file as of 08/18/2019.    Review of Systems  Constitutional: Negative for appetite change, chills, fatigue and fever.  Respiratory: Negative for cough, chest tightness, shortness of breath and wheezing.   Cardiovascular: Negative for chest pain, palpitations and leg swelling.  Gastrointestinal: Negative for abdominal distention, abdominal pain, constipation, diarrhea, nausea and vomiting.  Musculoskeletal: Positive for arthralgias. Negative for back pain and myalgias.       Bilateral knee pain and right hip pain on tramadol.  Skin: Negative for color change, pallor and rash.  Neurological: Negative for dizziness, weakness, light-headedness, numbness and headaches.  Psychiatric/Behavioral: Negative for agitation, confusion and sleep disturbance. The patient is not nervous/anxious.     Immunization History  Administered Date(s) Administered  . Fluad Quad(high Dose 65+) 05/05/2019  . Influenza, High Dose Seasonal PF 06/04/2017, 05/24/2018  . Influenza,inj,Quad PF,6+ Mos 07/05/2015  . Influenza-Unspecified 07/05/2013, 06/07/2014, 07/08/2016  . Pneumococcal Conjugate-13 03/07/2014  . Pneumococcal Polysaccharide-23 06/08/1999, 11/04/2018  . Td 09/08/2003  . Tdap 11/29/2013  . Zoster 08/08/2013  . Zoster Recombinat (Shingrix) 05/07/2019   Pertinent  Health Maintenance Due  Topic Date Due  . OPHTHALMOLOGY EXAM  04/29/2019  . HEMOGLOBIN A1C  11/05/2019  . FOOT EXAM  05/04/2020  . MAMMOGRAM  02/23/2021  . COLONOSCOPY  01/17/2025  . INFLUENZA VACCINE  Completed  . DEXA SCAN  Completed  . PNA vac Low Risk Adult  Completed   Fall Risk  08/18/2019 07/14/2019 05/05/2019 11/04/2018 04/29/2018  Falls in the past year? 0 0 0 0 No  Number falls in past yr: 0 0 - 0 -  Injury  with Fall? 0 0 - 0 -    Vitals:   08/18/19 0837  BP: 118/78  Pulse: 67  Temp: (!) 97.1 F (36.2 C)  TempSrc: Temporal  SpO2: 99%  Weight: 192 lb 3.2 oz (87.2 kg)  Height: '5\' 6"'$  (1.676 m)   Body mass index is 31.02 kg/m. Physical Exam Constitutional:      General: She is not in acute distress. HENT:     Head: Normocephalic.  Eyes:     General: No scleral icterus.       Right eye: No discharge.        Left eye: No discharge.     Extraocular Movements: Extraocular movements intact.     Conjunctiva/sclera: Conjunctivae normal.     Pupils: Pupils are equal, round, and reactive to light.  Cardiovascular:     Rate and Rhythm: Normal rate and regular rhythm.     Pulses: Normal pulses.     Heart sounds: Murmur present. No friction rub. No gallop.   Pulmonary:  Effort: Pulmonary effort is normal. No respiratory distress.     Breath sounds: Normal breath sounds. No wheezing, rhonchi or rales.  Chest:     Chest wall: No tenderness.  Abdominal:     General: Bowel sounds are normal. There is no distension.     Palpations: Abdomen is soft. There is no mass.     Tenderness: There is no abdominal tenderness. There is no right CVA tenderness, left CVA tenderness, guarding or rebound.  Musculoskeletal:        General: No tenderness.     Right lower leg: No edema.     Left lower leg: No edema.     Comments: Left knee crepitus noted.  Skin:    General: Skin is warm and dry.     Coloration: Skin is not pale.     Findings: No bruising or erythema.  Neurological:     Mental Status: She is alert and oriented to person, place, and time.     Cranial Nerves: No cranial nerve deficit.     Sensory: No sensory deficit.     Motor: No weakness.     Gait: Gait abnormal.  Psychiatric:        Mood and Affect: Mood normal.        Behavior: Behavior normal.        Thought Content: Thought content normal.        Judgment: Judgment normal.     Labs reviewed: Recent Labs    11/04/18 0921  05/05/19 1006  NA 140 141  K 3.9 3.9  CL 107 108  CO2 23 25  GLUCOSE 74 94  BUN 24 28*  CREATININE 1.07* 1.06*  CALCIUM 9.3 9.3   Recent Labs    11/04/18 0921  AST 24  ALT 20  BILITOT 0.5  PROT 7.2   Recent Labs    11/04/18 0921 05/01/19 0856  WBC 3.7* 5.3  NEUTROABS 1,447*  --   HGB 11.2* 12.0  HCT 33.4* 34.5  MCV 93.3 94  PLT 206 224   Lab Results  Component Value Date   TSH 1.49 04/29/2018   Lab Results  Component Value Date   HGBA1C 6.4 (H) 05/05/2019   Lab Results  Component Value Date   CHOL 200 (H) 05/05/2019   HDL 88 05/05/2019   LDLCALC 99 05/05/2019   LDLDIRECT 75 01/26/2011   TRIG 52 05/05/2019   CHOLHDL 2.3 05/05/2019    Significant Diagnostic Results in last 30 days:  No results found.  Assessment/Plan 1. Osteoarthritis, unspecified osteoarthritis type, unspecified site Pain on bilateral knees and right hip.Her average probability Opiod -induced respiratory depression in six months scored 55%.Requires tramadol mostly in the afternoon/night and takes tylenol during the day.will reduce Tramadol to once daily as needed.Also advised to uses Biofreeze to both knees three times as needed.Patient in agreement with pain management treatment. Pain contract discussed with patient and signed.  - Urine Drug Screen w/Alc, no confirm([facility]) - traMADol (ULTRAM) 50 MG tablet; Take 1 tablet (50 mg total) by mouth 2 (two) times daily as needed. Take one tablet ( 50 mg total) by mouth once daily as needed.  Dispense: 30 tablet; Refill: 0  2. Chronic pain of both knees Hx of knee replacement.Apply Biofreeze to both knees three times as needed.  Family/ staff Communication: Reviewed plan of care with patient.   Labs/tests ordered: - Urine Drug Screen w/Alc, no confirm([facility])  Jiayi Lengacher C Kenyanna Grzesiak, NP

## 2019-08-19 LAB — PAIN MGMT, PROFILE 1 W/O CONF, U
Amphetamines: NEGATIVE ng/mL
Barbiturates: NEGATIVE ng/mL
Benzodiazepines: NEGATIVE ng/mL
Cocaine Metabolite: NEGATIVE ng/mL
Creatinine: 116.1 mg/dL
Marijuana Metabolite: NEGATIVE ng/mL
Methadone Metabolite: NEGATIVE ng/mL
Opiates: NEGATIVE ng/mL
Oxidant: NEGATIVE ug/mL
Oxycodone: NEGATIVE ng/mL
Phencyclidine: NEGATIVE ng/mL
pH: 7 (ref 4.5–9.0)

## 2019-08-21 ENCOUNTER — Other Ambulatory Visit: Payer: Self-pay

## 2019-08-21 MED ORDER — ATORVASTATIN CALCIUM 40 MG PO TABS: ORAL_TABLET | ORAL | 0 refills | Status: DC

## 2019-08-21 MED ORDER — ATORVASTATIN CALCIUM 40 MG PO TABS: ORAL_TABLET | ORAL | 1 refills | Status: DC

## 2019-08-22 ENCOUNTER — Other Ambulatory Visit: Payer: Self-pay | Admitting: *Deleted

## 2019-08-22 DIAGNOSIS — I4891 Unspecified atrial fibrillation: Secondary | ICD-10-CM

## 2019-08-22 MED ORDER — APIXABAN 5 MG PO TABS: 5.0000 mg | ORAL_TABLET | Freq: Two times a day (BID) | ORAL | 2 refills | Status: DC

## 2019-08-22 NOTE — Telephone Encounter (Signed)
Eliquis 5mg  refill request received, pt is 68yrs old, weight-87.2kg, Crea- 1.06 on 05/05/2019, Diagnosis-Afib, and last seen by Dr. Caryl Comes on 05/01/2019. Dose is appropriate based on dosing criteria. Will send in refill to requested pharmacy.

## 2019-08-29 ENCOUNTER — Encounter: Payer: Self-pay | Admitting: Nurse Practitioner

## 2019-09-15 ENCOUNTER — Inpatient Hospital Stay (HOSPITAL_COMMUNITY)
Admission: EM | Admit: 2019-09-15 | Discharge: 2019-09-23 | DRG: 177 | Disposition: A | Discharge status: Home / Self Care | Payer: 59 | Attending: Internal Medicine | Admitting: Internal Medicine

## 2019-09-15 ENCOUNTER — Encounter (HOSPITAL_COMMUNITY): Payer: Self-pay

## 2019-09-15 ENCOUNTER — Ambulatory Visit (INDEPENDENT_AMBULATORY_CARE_PROVIDER_SITE_OTHER)
Admission: EM | Admit: 2019-09-15 | Discharge: 2019-09-15 | Disposition: A | Discharge status: Home / Self Care | Payer: 59 | Source: Home / Self Care | Attending: Family Medicine | Admitting: Family Medicine

## 2019-09-15 ENCOUNTER — Encounter (HOSPITAL_COMMUNITY): Payer: Self-pay | Admitting: *Deleted

## 2019-09-15 ENCOUNTER — Ambulatory Visit (INDEPENDENT_AMBULATORY_CARE_PROVIDER_SITE_OTHER): Payer: 59

## 2019-09-15 DIAGNOSIS — Z8673 Personal history of transient ischemic attack (TIA), and cerebral infarction without residual deficits: Secondary | ICD-10-CM

## 2019-09-15 DIAGNOSIS — U071 COVID-19: Secondary | ICD-10-CM | POA: Diagnosis present

## 2019-09-15 DIAGNOSIS — F329 Major depressive disorder, single episode, unspecified: Secondary | ICD-10-CM | POA: Diagnosis present

## 2019-09-15 DIAGNOSIS — I10 Essential (primary) hypertension: Secondary | ICD-10-CM | POA: Diagnosis not present

## 2019-09-15 DIAGNOSIS — J9601 Acute respiratory failure with hypoxia: Secondary | ICD-10-CM | POA: Diagnosis present

## 2019-09-15 DIAGNOSIS — G2581 Restless legs syndrome: Secondary | ICD-10-CM | POA: Diagnosis present

## 2019-09-15 DIAGNOSIS — E785 Hyperlipidemia, unspecified: Secondary | ICD-10-CM | POA: Diagnosis present

## 2019-09-15 DIAGNOSIS — N289 Disorder of kidney and ureter, unspecified: Secondary | ICD-10-CM | POA: Diagnosis not present

## 2019-09-15 DIAGNOSIS — Z888 Allergy status to other drugs, medicaments and biological substances status: Secondary | ICD-10-CM

## 2019-09-15 DIAGNOSIS — I2510 Atherosclerotic heart disease of native coronary artery without angina pectoris: Secondary | ICD-10-CM | POA: Diagnosis present

## 2019-09-15 DIAGNOSIS — N183 Chronic kidney disease, stage 3 unspecified: Secondary | ICD-10-CM | POA: Diagnosis present

## 2019-09-15 DIAGNOSIS — Z885 Allergy status to narcotic agent status: Secondary | ICD-10-CM

## 2019-09-15 DIAGNOSIS — I129 Hypertensive chronic kidney disease with stage 1 through stage 4 chronic kidney disease, or unspecified chronic kidney disease: Secondary | ICD-10-CM | POA: Diagnosis present

## 2019-09-15 DIAGNOSIS — E0821 Diabetes mellitus due to underlying condition with diabetic nephropathy: Secondary | ICD-10-CM | POA: Diagnosis not present

## 2019-09-15 DIAGNOSIS — D631 Anemia in chronic kidney disease: Secondary | ICD-10-CM | POA: Diagnosis present

## 2019-09-15 DIAGNOSIS — R0902 Hypoxemia: Secondary | ICD-10-CM

## 2019-09-15 DIAGNOSIS — M545 Low back pain, unspecified: Secondary | ICD-10-CM

## 2019-09-15 DIAGNOSIS — J159 Unspecified bacterial pneumonia: Secondary | ICD-10-CM | POA: Diagnosis present

## 2019-09-15 DIAGNOSIS — M1990 Unspecified osteoarthritis, unspecified site: Secondary | ICD-10-CM

## 2019-09-15 DIAGNOSIS — Z923 Personal history of irradiation: Secondary | ICD-10-CM | POA: Diagnosis not present

## 2019-09-15 DIAGNOSIS — Z79899 Other long term (current) drug therapy: Secondary | ICD-10-CM

## 2019-09-15 DIAGNOSIS — R000 Tachycardia, unspecified: Secondary | ICD-10-CM | POA: Diagnosis not present

## 2019-09-15 DIAGNOSIS — E7800 Pure hypercholesterolemia, unspecified: Secondary | ICD-10-CM | POA: Diagnosis present

## 2019-09-15 DIAGNOSIS — J1281 Pneumonia due to SARS-associated coronavirus: Secondary | ICD-10-CM

## 2019-09-15 DIAGNOSIS — T380X5A Adverse effect of glucocorticoids and synthetic analogues, initial encounter: Secondary | ICD-10-CM | POA: Diagnosis not present

## 2019-09-15 DIAGNOSIS — J1282 Pneumonia due to coronavirus disease 2019: Secondary | ICD-10-CM | POA: Diagnosis present

## 2019-09-15 DIAGNOSIS — E119 Type 2 diabetes mellitus without complications: Secondary | ICD-10-CM

## 2019-09-15 DIAGNOSIS — E860 Dehydration: Secondary | ICD-10-CM | POA: Diagnosis present

## 2019-09-15 DIAGNOSIS — Z96653 Presence of artificial knee joint, bilateral: Secondary | ICD-10-CM | POA: Diagnosis present

## 2019-09-15 DIAGNOSIS — I480 Paroxysmal atrial fibrillation: Secondary | ICD-10-CM | POA: Diagnosis present

## 2019-09-15 DIAGNOSIS — Z7901 Long term (current) use of anticoagulants: Secondary | ICD-10-CM

## 2019-09-15 DIAGNOSIS — E1122 Type 2 diabetes mellitus with diabetic chronic kidney disease: Secondary | ICD-10-CM | POA: Diagnosis present

## 2019-09-15 DIAGNOSIS — Z87891 Personal history of nicotine dependence: Secondary | ICD-10-CM

## 2019-09-15 DIAGNOSIS — E1165 Type 2 diabetes mellitus with hyperglycemia: Secondary | ICD-10-CM | POA: Diagnosis not present

## 2019-09-15 DIAGNOSIS — N179 Acute kidney failure, unspecified: Secondary | ICD-10-CM | POA: Diagnosis present

## 2019-09-15 DIAGNOSIS — Z7984 Long term (current) use of oral hypoglycemic drugs: Secondary | ICD-10-CM

## 2019-09-15 DIAGNOSIS — Z853 Personal history of malignant neoplasm of breast: Secondary | ICD-10-CM

## 2019-09-15 DIAGNOSIS — Z833 Family history of diabetes mellitus: Secondary | ICD-10-CM

## 2019-09-15 DIAGNOSIS — Z8261 Family history of arthritis: Secondary | ICD-10-CM | POA: Diagnosis not present

## 2019-09-15 LAB — CBC
HCT: 33.8 % — ABNORMAL LOW (ref 36.0–46.0)
Hemoglobin: 11.2 g/dL — ABNORMAL LOW (ref 12.0–15.0)
MCH: 32.3 pg (ref 26.0–34.0)
MCHC: 33.1 g/dL (ref 30.0–36.0)
MCV: 97.4 fL (ref 80.0–100.0)
Platelets: 190 10*3/uL (ref 150–400)
RBC: 3.47 MIL/uL — ABNORMAL LOW (ref 3.87–5.11)
RDW: 12.9 % (ref 11.5–15.5)
WBC: 5 10*3/uL (ref 4.0–10.5)
nRBC: 0 % (ref 0.0–0.2)

## 2019-09-15 LAB — HEPATIC FUNCTION PANEL
ALT: 31 U/L (ref 0–44)
AST: 52 U/L — ABNORMAL HIGH (ref 15–41)
Albumin: 3.5 g/dL (ref 3.5–5.0)
Alkaline Phosphatase: 64 U/L (ref 38–126)
Bilirubin, Direct: 0.3 mg/dL — ABNORMAL HIGH (ref 0.0–0.2)
Indirect Bilirubin: 0.4 mg/dL (ref 0.3–0.9)
Total Bilirubin: 0.7 mg/dL (ref 0.3–1.2)
Total Protein: 7.3 g/dL (ref 6.5–8.1)

## 2019-09-15 LAB — POC SARS CORONAVIRUS 2 AG -  ED
SARS Coronavirus 2 Ag: POSITIVE — AB
SARS Coronavirus 2 Ag: POSITIVE — AB

## 2019-09-15 LAB — BASIC METABOLIC PANEL
Anion gap: 10 (ref 5–15)
BUN: 25 mg/dL — ABNORMAL HIGH (ref 8–23)
CO2: 22 mmol/L (ref 22–32)
Calcium: 8.9 mg/dL (ref 8.9–10.3)
Chloride: 102 mmol/L (ref 98–111)
Creatinine, Ser: 1.37 mg/dL — ABNORMAL HIGH (ref 0.44–1.00)
GFR calc Af Amer: 45 mL/min — ABNORMAL LOW (ref >60)
GFR calc non Af Amer: 39 mL/min — ABNORMAL LOW (ref >60)
Glucose, Bld: 162 mg/dL — ABNORMAL HIGH (ref 70–99)
Potassium: 3.9 mmol/L (ref 3.5–5.1)
Sodium: 134 mmol/L — ABNORMAL LOW (ref 135–145)

## 2019-09-15 LAB — LACTIC ACID, PLASMA
Lactic Acid, Venous: 1.2 mmol/L (ref 0.5–1.9)
Lactic Acid, Venous: 0.8 mmol/L (ref 0.5–1.9)

## 2019-09-15 LAB — D-DIMER, QUANTITATIVE: D-Dimer, Quant: 0.67 ug/mL-FEU — ABNORMAL HIGH (ref 0.00–0.50)

## 2019-09-15 LAB — PROCALCITONIN: Procalcitonin: 0.36 ng/mL

## 2019-09-15 LAB — TRIGLYCERIDES: Triglycerides: 96 mg/dL (ref <150)

## 2019-09-15 LAB — CBG MONITORING, ED
Glucose-Capillary: 152 mg/dL — ABNORMAL HIGH (ref 70–99)
Glucose-Capillary: 142 mg/dL — ABNORMAL HIGH (ref 70–99)

## 2019-09-15 LAB — FIBRINOGEN: Fibrinogen: 573 mg/dL — ABNORMAL HIGH (ref 210–475)

## 2019-09-15 LAB — C-REACTIVE PROTEIN: CRP: 11.2 mg/dL — ABNORMAL HIGH (ref <1.0)

## 2019-09-15 LAB — LACTATE DEHYDROGENASE: LDH: 378 U/L — ABNORMAL HIGH (ref 98–192)

## 2019-09-15 LAB — POC SARS CORONAVIRUS 2 AG: SARS Coronavirus 2 Ag: POSITIVE — AB

## 2019-09-15 LAB — FERRITIN: Ferritin: 462 ng/mL — ABNORMAL HIGH (ref 11–307)

## 2019-09-15 LAB — HIV ANTIBODY (ROUTINE TESTING W REFLEX): HIV Screen 4th Generation wRfx: NONREACTIVE

## 2019-09-15 MED ORDER — SODIUM CHLORIDE 0.9 % IV SOLN
100.0000 mg | Freq: Every day | INTRAVENOUS | Status: AC
  Administered 2019-09-16 – 2019-09-19 (×4): 100 mg via INTRAVENOUS
  Filled 2019-09-15 (×4): qty 20

## 2019-09-15 MED ORDER — ACETAMINOPHEN 325 MG PO TABS
650.0000 mg | ORAL_TABLET | Freq: Once | ORAL | Status: AC
  Administered 2019-09-15: 650 mg via ORAL
  Filled 2019-09-15: qty 2

## 2019-09-15 MED ORDER — ZINC SULFATE 220 (50 ZN) MG PO CAPS
220.0000 mg | ORAL_CAPSULE | Freq: Every day | ORAL | Status: DC
  Administered 2019-09-15 – 2019-09-23 (×9): 220 mg via ORAL
  Filled 2019-09-15 (×9): qty 1

## 2019-09-15 MED ORDER — SODIUM CHLORIDE 0.9 % IV SOLN
2.0000 g | INTRAVENOUS | Status: AC
  Administered 2019-09-15 – 2019-09-19 (×5): 2 g via INTRAVENOUS
  Filled 2019-09-15 (×5): qty 20

## 2019-09-15 MED ORDER — CARVEDILOL 25 MG PO TABS
25.0000 mg | ORAL_TABLET | Freq: Two times a day (BID) | ORAL | Status: DC
  Administered 2019-09-15 – 2019-09-23 (×17): 25 mg via ORAL
  Filled 2019-09-15 (×12): qty 1
  Filled 2019-09-15: qty 2
  Filled 2019-09-15 (×4): qty 1

## 2019-09-15 MED ORDER — ONDANSETRON HCL 4 MG/2ML IJ SOLN: 4.0000 mg | Freq: Four times a day (QID) | INTRAMUSCULAR | Status: DC | PRN

## 2019-09-15 MED ORDER — SODIUM CHLORIDE 0.9 % IV SOLN
500.0000 mg | INTRAVENOUS | Status: AC
  Administered 2019-09-15 – 2019-09-19 (×5): 500 mg via INTRAVENOUS
  Filled 2019-09-15 (×5): qty 500

## 2019-09-15 MED ORDER — SODIUM CHLORIDE 0.9% FLUSH
3.0000 mL | Freq: Two times a day (BID) | INTRAVENOUS | Status: DC
  Administered 2019-09-16 – 2019-09-23 (×15): 3 mL via INTRAVENOUS

## 2019-09-15 MED ORDER — INSULIN ASPART 100 UNIT/ML ~~LOC~~ SOLN
0.0000 [IU] | Freq: Every day | SUBCUTANEOUS | Status: DC
  Administered 2019-09-16 – 2019-09-18 (×2): 3 [IU] via SUBCUTANEOUS
  Administered 2019-09-19: 2 [IU] via SUBCUTANEOUS
  Administered 2019-09-20: 4 [IU] via SUBCUTANEOUS
  Administered 2019-09-21: 2 [IU] via SUBCUTANEOUS
  Administered 2019-09-22: 4 [IU] via SUBCUTANEOUS

## 2019-09-15 MED ORDER — ATORVASTATIN CALCIUM 40 MG PO TABS
40.0000 mg | ORAL_TABLET | Freq: Every day | ORAL | Status: DC
  Administered 2019-09-15 – 2019-09-23 (×9): 40 mg via ORAL
  Filled 2019-09-15 (×9): qty 1

## 2019-09-15 MED ORDER — MUSCLE RUB 10-15 % EX CREA: 1.0000 "application " | TOPICAL_CREAM | Freq: Three times a day (TID) | CUTANEOUS | Status: DC | PRN

## 2019-09-15 MED ORDER — APIXABAN 5 MG PO TABS
5.0000 mg | ORAL_TABLET | Freq: Two times a day (BID) | ORAL | Status: DC
  Administered 2019-09-15 – 2019-09-23 (×16): 5 mg via ORAL
  Filled 2019-09-15 (×16): qty 1

## 2019-09-15 MED ORDER — INSULIN ASPART 100 UNIT/ML ~~LOC~~ SOLN
0.0000 [IU] | Freq: Three times a day (TID) | SUBCUTANEOUS | Status: DC
  Administered 2019-09-15 – 2019-09-16 (×2): 2 [IU] via SUBCUTANEOUS
  Administered 2019-09-16 – 2019-09-17 (×3): 3 [IU] via SUBCUTANEOUS
  Administered 2019-09-17: 2 [IU] via SUBCUTANEOUS
  Administered 2019-09-17: 5 [IU] via SUBCUTANEOUS
  Administered 2019-09-18: 2 [IU] via SUBCUTANEOUS
  Administered 2019-09-18 – 2019-09-19 (×3): 3 [IU] via SUBCUTANEOUS
  Administered 2019-09-19: 2 [IU] via SUBCUTANEOUS
  Administered 2019-09-19 – 2019-09-20 (×2): 3 [IU] via SUBCUTANEOUS
  Administered 2019-09-20: 5 [IU] via SUBCUTANEOUS
  Administered 2019-09-20 – 2019-09-21 (×4): 3 [IU] via SUBCUTANEOUS
  Administered 2019-09-22: 2 [IU] via SUBCUTANEOUS
  Administered 2019-09-22: 3 [IU] via SUBCUTANEOUS
  Administered 2019-09-22: 1 [IU] via SUBCUTANEOUS
  Administered 2019-09-23: 5 [IU] via SUBCUTANEOUS
  Administered 2019-09-23: 3 [IU] via SUBCUTANEOUS
  Administered 2019-09-23: 5 [IU] via SUBCUTANEOUS

## 2019-09-15 MED ORDER — DEXAMETHASONE SODIUM PHOSPHATE 10 MG/ML IJ SOLN
6.0000 mg | INTRAMUSCULAR | Status: DC
  Administered 2019-09-15 – 2019-09-23 (×9): 6 mg via INTRAVENOUS
  Filled 2019-09-15 (×9): qty 1

## 2019-09-15 MED ORDER — ALBUTEROL SULFATE HFA 108 (90 BASE) MCG/ACT IN AERS
2.0000 | INHALATION_SPRAY | Freq: Four times a day (QID) | RESPIRATORY_TRACT | Status: DC
  Administered 2019-09-15 – 2019-09-23 (×31): 2 via RESPIRATORY_TRACT
  Filled 2019-09-15 (×2): qty 6.7

## 2019-09-15 MED ORDER — ROPINIROLE HCL 0.25 MG PO TABS
0.2500 mg | ORAL_TABLET | Freq: Every day | ORAL | Status: DC
  Administered 2019-09-15 – 2019-09-22 (×8): 0.25 mg via ORAL
  Filled 2019-09-15 (×9): qty 1

## 2019-09-15 MED ORDER — ONDANSETRON HCL 4 MG PO TABS
4.0000 mg | ORAL_TABLET | Freq: Four times a day (QID) | ORAL | Status: DC | PRN
  Administered 2019-09-18 – 2019-09-20 (×3): 4 mg via ORAL
  Filled 2019-09-15 (×3): qty 1

## 2019-09-15 MED ORDER — GUAIFENESIN-DM 100-10 MG/5ML PO SYRP
10.0000 mL | ORAL_SOLUTION | ORAL | Status: DC | PRN
  Administered 2019-09-17 – 2019-09-20 (×3): 10 mL via ORAL
  Filled 2019-09-15 (×4): qty 10

## 2019-09-15 MED ORDER — ASCORBIC ACID 500 MG PO TABS
500.0000 mg | ORAL_TABLET | Freq: Every day | ORAL | Status: DC
  Administered 2019-09-15 – 2019-09-23 (×9): 500 mg via ORAL
  Filled 2019-09-15 (×9): qty 1

## 2019-09-15 MED ORDER — SODIUM CHLORIDE 0.9 % IV SOLN
200.0000 mg | Freq: Once | INTRAVENOUS | Status: AC
  Administered 2019-09-15: 200 mg via INTRAVENOUS
  Filled 2019-09-15 (×2): qty 40

## 2019-09-15 MED ORDER — AMLODIPINE BESYLATE 5 MG PO TABS
5.0000 mg | ORAL_TABLET | Freq: Every day | ORAL | Status: DC
  Administered 2019-09-15 – 2019-09-20 (×6): 5 mg via ORAL
  Filled 2019-09-15 (×6): qty 1

## 2019-09-15 MED ORDER — TRAMADOL HCL 50 MG PO TABS
50.0000 mg | ORAL_TABLET | Freq: Two times a day (BID) | ORAL | Status: DC | PRN
  Administered 2019-09-17 – 2019-09-20 (×4): 50 mg via ORAL
  Filled 2019-09-15 (×5): qty 1

## 2019-09-15 NOTE — H&P (Signed)
History and Physical    [name] ZOX:[id number] DOB: Jul 26, 1951 DOA: 09/15/2019  Referring MD/NP/PA: Janetta Hora, PA-C PCP: Lauree Chandler, NP  Patient coming from: Urgent care  Chief Complaint: Back pain and cough  I have personally briefly reviewed patient's old medical records in [place]   HPI: [name] is a 69 y.o. female with medical history significant of HTN, DM type 2, CAD, congestive heart failure, PAF on anticoagulation, back pain, RLS, TIA, breast cancer status post lumpectomy with radiation, and depression.  She presents with 1 week history of cough and lower back pain.  She reports having a productive cough that has caused her to have mid to lower back pain.  Associated symptoms include fatigue, poor appetite, body aches, and mild shortness of breath with exertion.  She was try to keep herself adequately hydrated by drinking fluids.  Denies having any fever, nausea, vomiting, diarrhea, chest pain, leg swelling, or dysuria symptoms.  She had tested negative for COVID-19 earlier in the week when symptoms first started.  Patient return to urgent care today where she had tested positive and chest x-ray revealed bilateral infiltrates and a small right-sided pleural effusion. O2 saturation were noted to drop down to 86% with ambulation at the urgent care facility.  She has a remote history of tobacco use.  ED Course: Upon admission into the emergency department patient was seen to be afebrile, respirations up to 31, blood pressure 148/72-175/83, and O2 saturations currently maintained 90- 96% on 2 L of nasal cannula oxygen.  Labs significant for WBC 5, hemoglobin 11.2, sodium 134, BUN 25, creatinine 1.37, AST 52, LDH 378, triglycerides 96, ferritin 462, CRP 11.2, lactic acid 1.2, D-dimer 0.67, and fibrinogen 573.  TRH called to admit.  Review of Systems  Constitutional: Positive for malaise/fatigue. Negative for chills and fever.  HENT: Negative for congestion  and nosebleeds.   Eyes: Negative for photophobia and pain.  Respiratory: Positive for cough, sputum production, shortness of breath and wheezing.   Cardiovascular: Negative for chest pain and PND.  Gastrointestinal: Negative for abdominal pain, nausea and vomiting.  Genitourinary: Negative for dysuria and hematuria.  Musculoskeletal: Positive for back pain and myalgias.  Skin: Negative for rash.  Neurological: Negative for speech change, focal weakness and loss of consciousness.  Psychiatric/Behavioral: Negative for hallucinations and substance abuse.    Past Medical History:  Diagnosis Date  . Anxiety   . Arthritis    osteoarthritis  . Breast cancer ([place]) 2008   right, ER/PR -  . CHF (congestive heart failure) ([place])   . Depression   . Diabetes mellitus    TYPE 2  . Gastroparesis   . Hx of radiation therapy 04/04/07 to 05/20/07   right breast/6260 cGy  . Hypercholesterolemia   . Hypertension   . Lumbago   . Malignant neoplasm of breast (female), unspecified site   . Nonspecific elevation of levels of transaminase or lactic acid dehydrogenase (LDH)   . Osteoarthrosis, unspecified whether generalized or localized, unspecified site   . Regional enteritis of unspecified site   . Restless legs syndrome (RLS)   . TIA (transient ischemic attack)   . Urinary frequency   . Uterine prolapse without mention of vaginal wall prolapse   . Vitamin deficiency     Past Surgical History:  Procedure Laterality Date  . ABDOMINAL HYSTERECTOMY  1997   TAH/BSO, ENDOMETRIAL SARCOMA  . ARTHROPLASTY     left knee  . BREAST LUMPECTOMY Right 11/17/06  re-excision 01/13/07  . RADICAL HYSTERECTOMY  1997  . TOTAL KNEE ARTHROPLASTY Left 06/10/2011      . TOTAL KNEE ARTHROPLASTY Right 2006, 2009   Dr Para March  . TOTAL KNEE ARTHROPLASTY Left 2008   Dr Para March  . WRIST SURGERY     carpel tunnel     reports that she quit smoking about 28 years ago. She has a 10.00 pack-year smoking history. She has  never used smokeless tobacco. She reports that she does not drink alcohol or use drugs.  Allergies  Allergen Reactions  . Hydrocodone Nausea And Vomiting  . Lisinopril     Family History  Problem Relation Age of Onset  . Diabetes Mother   . Diabetes Father   . Aneurysm Sister   . Arthritis Sister   . Diabetes Brother   . Heart Problems Brother   . Colon cancer Neg Hx     Prior to Admission medications   Medication Sig Start Date End Date Taking? Authorizing Provider  Accu-Chek FastClix Lancets MISC Check blood sugar once daily as directed E11.59 11/11/18   Reed, Tiffany L, DO  amLODipine (NORVASC) 10 MG tablet Take 0.5 tablets (5 mg total) by mouth daily. 06/01/19   Lauree Chandler, NP  apixaban (ELIQUIS) 5 MG TABS tablet Take 1 tablet (5 mg total) by mouth 2 (two) times daily. 08/22/19   Deboraha Sprang, MD  atorvastatin (LIPITOR) 40 MG tablet TAKE 1 TABLET DAILY FOR    CHOLESTEROL 08/21/19   Lauree Chandler, NP  Blood Glucose Monitoring Suppl (ACCU-CHEK AVIVA PLUS) w/Device KIT Check blood sugar once daily as directed E11.59  Acc-Chek guide 11/11/18   Reed, Tiffany L, DO  carvedilol (COREG) 25 MG tablet Take 1 tablet (25 mg total) by mouth 2 (two) times daily with a meal. 07/25/19   Lauree Chandler, NP  Ferrous Sulfate (IRON) 325 (65 Fe) MG TABS Take by mouth daily.    [provider]  glucose blood (ACCU-CHEK GUIDE) test strip Use as instructed Check blood sugar once daily as directed E11.59 11/11/18   Reed, Tiffany L, DO  losartan (COZAAR) 100 MG tablet TAKE 1 TABLET BY MOUTH DAILY TO CONTROL BLOOD PRESSURE. 06/15/19   Deboraha Sprang, MD  Menthol, Topical Analgesic, (BIOFREEZE) 4 % GEL Apply 3 oz topically 3 (three) times daily as needed. Apply to both knees for pain 08/18/19   Ngetich, Dinah C, NP  metFORMIN (GLUCOPHAGE) 1000 MG tablet Take 1,000 mg by mouth daily with breakfast.    [provider]  rOPINIRole (REQUIP) 0.25 MG tablet Take 1 tablet (0.25 mg  total) by mouth at bedtime. For restless legs 05/05/19   Lauree Chandler, NP  traMADol (ULTRAM) 50 MG tablet Take 1 tablet (50 mg total) by mouth 2 (two) times daily as needed. Take one tablet ( 50 mg total) by mouth once daily as needed. 08/18/19 09/17/19  Ngetich, Nelda Bucks, NP    Physical Exam:  Constitutional: Elderly female who appears acutely ill but in no acute distress at this time Vitals:   09/15/19 1211 09/15/19 1222 09/15/19 1425 09/15/19 1445  BP:    (!) 148/70  Pulse:      Resp:    (!) 31  Temp:   99.3 F (37.4 C)   TempSrc:   Oral   SpO2: 96%     Weight:  87.1 kg     Eyes: PERRL, lids and conjunctivae normal ENMT: Mucous membranes are dry.  Posterior pharynx clear  of any exudate or lesions..  Neck: normal, supple, no masses, no thyromegaly Respiratory: Normal respiratory effort with bibasilar crackles appreciated.  No significant wheezing or rhonchi.  O2 saturations maintained on 2 L nasal cannula oxygen. Cardiovascular: Regular rate and rhythm, no murmurs / rubs / gallops. No extremity edema. 2+ pedal pulses. No carotid bruits.  Abdomen: no tenderness, no masses palpated. No hepatosplenomegaly. Bowel sounds positive.  Musculoskeletal: no clubbing / cyanosis. No joint deformity upper and lower extremities. Good ROM, no contractures. Normal muscle tone.  Skin: no rashes, lesions, ulcers. No induration Neurologic: CN 2-12 grossly intact. Sensation intact, DTR normal. Strength 5/5 in all 4.  Psychiatric: Normal judgment and insight. Alert and oriented x 3. Normal mood.     Labs on Admission: I have personally reviewed following labs and imaging studies  CBC: Recent Labs  Lab 09/15/19 1230  WBC 5.0  HGB 11.2*  HCT 33.8*  MCV 97.4  PLT 794   Basic Metabolic Panel: Recent Labs  Lab 09/15/19 1230  NA 134*  K 3.9  CL 102  CO2 22  GLUCOSE 162*  BUN 25*  CREATININE 1.37*  CALCIUM 8.9   GFR: Estimated Creatinine Clearance: 42.5 mL/min (A) (by C-G formula  based on SCr of 1.37 mg/dL (H)). Liver Function Tests: No results for input(s): AST, ALT, ALKPHOS, BILITOT, PROT, ALBUMIN in the last 168 hours. No results for input(s): LIPASE, AMYLASE in the last 168 hours. No results for input(s): AMMONIA in the last 168 hours. Coagulation Profile: No results for input(s): INR, PROTIME in the last 168 hours. Cardiac Enzymes: No results for input(s): CKTOTAL, CKMB, CKMBINDEX, TROPONINI in the last 168 hours. BNP (last 3 results) No results for input(s): PROBNP in the last 8760 hours. HbA1C: No results for input(s): HGBA1C in the last 72 hours. CBG: No results for input(s): GLUCAP in the last 168 hours. Lipid Profile: No results for input(s): CHOL, HDL, LDLCALC, TRIG, CHOLHDL, LDLDIRECT in the last 72 hours. Thyroid Function Tests: No results for input(s): TSH, T4TOTAL, FREET4, T3FREE, THYROIDAB in the last 72 hours. Anemia Panel: No results for input(s): VITAMINB12, FOLATE, FERRITIN, TIBC, IRON, RETICCTPCT in the last 72 hours. Urine analysis:    Component Value Date/Time   COLORURINE YELLOW 04/29/2018 1100   APPEARANCEUR CLEAR 04/29/2018 1100   LABSPEC 1.011 04/29/2018 1100   PHURINE 6.0 04/29/2018 1100   GLUCOSEU NEGATIVE 04/29/2018 1100   HGBUR NEGATIVE 04/29/2018 1100   BILIRUBINUR NEGATIVE 04/09/2017 1145   KETONESUR NEGATIVE 04/29/2018 1100   PROTEINUR NEGATIVE 04/29/2018 1100   UROBILINOGEN 0.2 06/02/2011 1123   NITRITE NEGATIVE 04/29/2018 1100   LEUKOCYTESUR 1+ (A) 04/29/2018 1100   Sepsis Labs: No results found for this or any previous visit (from the past 240 hour(s)).   Radiological Exams on Admission: DG Chest 2 View  Result Date: 09/15/2019 CLINICAL DATA:  Back pain.  COVID positive. EXAM: CHEST - 2 VIEW COMPARISON:  Chest x-ray 09/27/2015. FINDINGS: Mediastinum hilar structures normal. Stable cardiomegaly. No pulmonary venous congestion. Prominent bibasilar pulmonary interstitial infiltrates/edema. Small right pleural  effusion. No pneumothorax. Degenerative changes thoracic spine. IMPRESSION: 1.  Stable cardiomegaly.  No pulmonary venous congestion. 2. Prominent bibasilar pulmonary interstitial infiltrates/edema. Small right pleural effusion. Electronically Signed   By: Marcello Moores  Register   On: 09/15/2019 11:39    EKG: Independently reviewed.  Sinus rhythm at  81 bpm  Assessment/Plan Acute respiratory failure with hypoxia secondary to pneumonia due to COVID-19: Patient presents with complaints of a productive cough.  Found to  be Covid positive with chest x-ray showing bilateral pulmonary opacities concerning for infection versus edema. LDH 378, triglycerides 96, ferritin 462, CRP 11.2, procalcitonin 0.36, and fibrinogen 573. -Admit to a telemetry bed  -COVID-19 admission order set utilized -Continuous pulse oximetry with nasal cannula oxygen to maintain O2 saturation greater than 90% -Follow-up BNP -Albuterol inhaler -Decadron 6 mg IV daily -Remdesivir per pharmacy -Empiric antibiotics of Rocephin and azithromycin due to mildly elevated procalcitonin -Vitamin C and zinc -Antitussives as needed -Continue daily monitoring of inflammatory markers  Essential hypertension: Blood pressures initially elevated up to 175/83.  Home blood pressure medications include amlodipine 10 mg daily, Coreg 25 mg twice daily, losartan 100 mg daily -Continue Coreg and amlodipine -Held losartan  Diabetes mellitus type 2 (controlled): Patient appears well controlled on oral medications of metformin.  Last hemoglobin A1c noted to be 6.4 in 04/2019 -Hypoglycemic protocol -Hold Metformin -CBGs before every meal with sensitive SSI  Renal insufficiency superimposed on chronic kidney disease stage III: Patient presents with creatinine elevated at 1.37 with BUN 25.  Baseline creatinine previously noted to be around 1. She reports decreased p.o. intake and suspecting  aspect of dehydration as the likely cause. -Continue to monitor  kidney function  Paroxysmal atrial fibrillation on chronic anticoagulation: Patient on anticoagulation of Eliquis.  She appears to be in sinus rhythm at this time. -Continue Eliquis Elevated AST: Acute.  On admission AST mildly elevated at 52.  Patient denies any history of alcohol abuse. -Continue to monitor  Hyperlipidemia -Continue atorvastatin  Restless leg syndrome -Continue ropinirole  DVT prophylaxis: Eliquis Code Status: full  Family Communication: daughter Disposition Plan: Possible discharge home once medically stable in 2 to 5 days Consults called: None Admission status: inpatient   Norval Morton MD [facility] Pager [phone]   If 7PM-7AM, please contact night-coverage [email or web address] Password [facility]  09/15/2019, 2:57 PM

## 2019-09-15 NOTE — ED Triage Notes (Signed)
Pt was sent here from [facility] for further evaluation of cough and back pain.  Pt reports cough productive for green sputum and lower back pain with coughing.  Pt denies any fever or chills with this, no SOB, pt denies having been in contact with anyone with covid

## 2019-09-15 NOTE — ED Provider Notes (Signed)
[facility]    CSN: [id number] Arrival date & time: 09/15/19  1004      History   Chief Complaint Chief Complaint  Patient presents with  . Back Pain  . Cough    HPI [name] is a 69 y.o. female.   HPI  Patient registered to be seen for back pain.  She was placed in room and when being triaged she identified that she had back pain because of coughing.  She been coughing for several days.  She is coughing up green sputum.  Her repeated coughing is what had caused her to have mid to lower back pain.  She had pain with coughing.  Mild shortness of breath with exertion.  Fatigue.  Some achiness.  No known exposure to coronavirus.  The symptoms have been going on for over a week.  Early on in the course of this illness she went had coronavirus testing, it was negative.  She has continued to work, and is here because she had to leave work early last night. Poor appetite.  Still drinking fluids.  No nausea or vomiting. Patient has risk factors for severe illness.  Diabetes.  Age over 89.  Renal impairment.  Anemia of chronic disease.  History, remote, breast cancer  Past Medical History:  Diagnosis Date  . Anxiety   . Arthritis    osteoarthritis  . Breast cancer ([place]) 2008   right, ER/PR -  . CHF (congestive heart failure) ([place])   . Depression   . Diabetes mellitus    TYPE 2  . Gastroparesis   . Hx of radiation therapy 04/04/07 to 05/20/07   right breast/6260 cGy  . Hypercholesterolemia   . Hypertension   . Lumbago   . Malignant neoplasm of breast (female), unspecified site   . Nonspecific elevation of levels of transaminase or lactic acid dehydrogenase (LDH)   . Osteoarthrosis, unspecified whether generalized or localized, unspecified site   . Regional enteritis of unspecified site   . Restless legs syndrome (RLS)   . TIA (transient ischemic attack)   . Urinary frequency   . Uterine prolapse without mention of vaginal wall prolapse   . Vitamin deficiency      Patient Active Problem List   Diagnosis Date Noted  . Anemia, chronic disease 10/15/2017  . Type 2 diabetes mellitus without complication, without long-term current use of insulin ([place]) 10/15/2017  . Hyperlipidemia associated with type 2 diabetes mellitus ([place]) 10/15/2017  . Claustrophobia 07/05/2015  . Bilateral edema of lower extremity 09/11/2014  . CKD stage 3 due to type 2 diabetes mellitus ([place]) 03/07/2014  . Restless leg syndrome 11/29/2013  . Other and unspecified hyperlipidemia 11/29/2013  . Osteoarthritis 11/29/2013  . CKD (chronic kidney disease) 08/16/2013  . Insomnia 08/16/2013  . Atrial flutter- not confirmed on ECG review 12/15/2012  . Loss of weight 12/15/2012  . Gastroparesis 12/14/2012  . Renal impairment 12/14/2012  . History of malignant neoplasm of right breast 01/01/2012  . Arthritis   . Hx of radiation therapy   . OSTEOARTHRITIS, HANDS, BILATERAL 05/29/2009  . CHEST PAIN, ATYPICAL 02/09/2008  . LOW BACK PAIN, ACUTE 01/03/2007  . Diabetes mellitus due to underlying condition with diabetic nephropathy ([place]) 11/04/2006  . HYPERCHOLESTEROLEMIA 11/04/2006  . HYPERTENSION, BENIGN SYSTEMIC 11/04/2006  . Cardiomyopathy-recovered 11/04/2006  . MENOPAUSAL SYNDROME 11/04/2006  . OSTEOARTHRITIS, LOWER LEG 11/04/2006  . GAS/BLOATING 11/04/2006    Past Surgical History:  Procedure Laterality Date  . ABDOMINAL HYSTERECTOMY  1997  TAH/BSO, ENDOMETRIAL SARCOMA  . ARTHROPLASTY     left knee  . BREAST LUMPECTOMY Right 11/17/06   re-excision 01/13/07  . RADICAL HYSTERECTOMY  1997  . TOTAL KNEE ARTHROPLASTY Left 06/10/2011      . TOTAL KNEE ARTHROPLASTY Right 2006, 2009   Dr Para March  . TOTAL KNEE ARTHROPLASTY Left 2008   Dr Para March  . WRIST SURGERY     carpel tunnel    OB History   No obstetric history on file.      Home Medications    Prior to Admission medications   Medication Sig Start Date End Date Taking? Authorizing Provider  Accu-Chek FastClix  Lancets MISC Check blood sugar once daily as directed E11.59 11/11/18   Reed, Tiffany L, DO  amLODipine (NORVASC) 10 MG tablet Take 0.5 tablets (5 mg total) by mouth daily. 06/01/19   Lauree Chandler, NP  apixaban (ELIQUIS) 5 MG TABS tablet Take 1 tablet (5 mg total) by mouth 2 (two) times daily. 08/22/19   Deboraha Sprang, MD  atorvastatin (LIPITOR) 40 MG tablet TAKE 1 TABLET DAILY FOR    CHOLESTEROL 08/21/19   Lauree Chandler, NP  Blood Glucose Monitoring Suppl (ACCU-CHEK AVIVA PLUS) w/Device KIT Check blood sugar once daily as directed E11.59  Acc-Chek guide 11/11/18   Reed, Tiffany L, DO  carvedilol (COREG) 25 MG tablet Take 1 tablet (25 mg total) by mouth 2 (two) times daily with a meal. 07/25/19   Lauree Chandler, NP  Ferrous Sulfate (IRON) 325 (65 Fe) MG TABS Take by mouth daily.    [provider]  glucose blood (ACCU-CHEK GUIDE) test strip Use as instructed Check blood sugar once daily as directed E11.59 11/11/18   Reed, Tiffany L, DO  losartan (COZAAR) 100 MG tablet TAKE 1 TABLET BY MOUTH DAILY TO CONTROL BLOOD PRESSURE. 06/15/19   Deboraha Sprang, MD  Menthol, Topical Analgesic, (BIOFREEZE) 4 % GEL Apply 3 oz topically 3 (three) times daily as needed. Apply to both knees for pain 08/18/19   Ngetich, Dinah C, NP  metFORMIN (GLUCOPHAGE) 1000 MG tablet Take 1,000 mg by mouth daily with breakfast.    [provider]  rOPINIRole (REQUIP) 0.25 MG tablet Take 1 tablet (0.25 mg total) by mouth at bedtime. For restless legs 05/05/19   Lauree Chandler, NP  traMADol (ULTRAM) 50 MG tablet Take 1 tablet (50 mg total) by mouth 2 (two) times daily as needed. Take one tablet ( 50 mg total) by mouth once daily as needed. 08/18/19 09/17/19  Ngetich, Nelda Bucks, NP    Family History Family History  Problem Relation Age of Onset  . Diabetes Mother   . Diabetes Father   . Aneurysm Sister   . Arthritis Sister   . Diabetes Brother   . Heart Problems Brother   . Colon cancer Neg Hx      Social History Social History   Tobacco Use  . Smoking status: Former Smoker    Packs/day: 0.50    Years: 20.00    Pack years: 10.00    Quit date: 09/08/1991    Years since quitting: 28.0  . Smokeless tobacco: Never Used  Substance Use Topics  . Alcohol use: No  . Drug use: No     Allergies   Hydrocodone and Lisinopril   Review of Systems Review of Systems  Constitutional: Positive for appetite change and fatigue. Negative for chills and fever.  HENT: Negative for congestion and hearing loss.   Eyes:  Negative for pain.  Respiratory: Positive for cough and shortness of breath.   Cardiovascular: Negative for chest pain and leg swelling.  Gastrointestinal: Negative for abdominal pain, constipation and diarrhea.  Genitourinary: Negative for dysuria and frequency.  Musculoskeletal: Positive for back pain. Negative for myalgias.  Neurological: Positive for weakness and headaches. Negative for dizziness and seizures.  Psychiatric/Behavioral: Negative for sleep disturbance. The patient is not nervous/anxious.      Physical Exam Triage Vital Signs ED Triage Vitals  Enc Vitals Group     BP 09/15/19 1019 (!) 155/82     Pulse Rate 09/15/19 1019 85     Resp 09/15/19 1019 18     Temp 09/15/19 1019 99.2 F (37.3 C)     Temp Source 09/15/19 1019 Oral     SpO2 09/15/19 1019 93 %     Weight --      Height --      Head Circumference --      Peak Flow --      Pain Score 09/15/19 1024 4     Pain Loc --      Pain Edu? --      Excl. in [place]? --    No data found.  Updated Vital Signs BP (!) 155/82 (BP Location: Right Arm)   Pulse 85   Temp 99.2 F (37.3 C) (Oral)   Resp (!) 22   SpO2 95% Comment: Per Dr.Lilia Letterman WITH AMBULATION:  Resp: 30  Spo2: 86%    Physical Exam Constitutional:      General: She is in acute distress.     Appearance: She is well-developed and normal weight. She is ill-appearing.  HENT:     Head: Normocephalic and atraumatic.     Mouth/Throat:      Comments: Mask in place Eyes:     Conjunctiva/sclera: Conjunctivae normal.     Pupils: Pupils are equal, round, and reactive to light.  Cardiovascular:     Rate and Rhythm: Tachycardia present.     Heart sounds: Normal heart sounds.  Pulmonary:     Effort: Pulmonary effort is normal. No respiratory distress.     Breath sounds: Rales present.  Abdominal:     General: There is no distension.     Palpations: Abdomen is soft.  Musculoskeletal:        General: No swelling. Normal range of motion.     Cervical back: Normal range of motion.  Skin:    General: Skin is warm and dry.  Neurological:     General: No focal deficit present.     Mental Status: She is alert.     Motor: Weakness present.     Comments: Generalized weakness  Psychiatric:        Mood and Affect: Mood normal.        Behavior: Behavior normal.     Comments: Tired      UC Treatments / Results  Labs (all labs ordered are listed, but only abnormal results are displayed) Labs Reviewed  POC SARS CORONAVIRUS 2 AG -  ED - Abnormal; Notable for the following components:      Result Value   SARS Coronavirus 2 Ag POSITIVE (*)    All other components within normal limits  POC SARS CORONAVIRUS 2 AG - Abnormal; Notable for the following components:   SARS Coronavirus 2 Ag POSITIVE (*)    All other components within normal limits  POC SARS CORONAVIRUS 2 AG -  ED - Abnormal; Notable for the  following components:   SARS Coronavirus 2 Ag POSITIVE (*)    All other components within normal limits    EKG   Radiology DG Chest 2 View  Result Date: 09/15/2019 CLINICAL DATA:  Back pain.  COVID positive. EXAM: CHEST - 2 VIEW COMPARISON:  Chest x-ray 09/27/2015. FINDINGS: Mediastinum hilar structures normal. Stable cardiomegaly. No pulmonary venous congestion. Prominent bibasilar pulmonary interstitial infiltrates/edema. Small right pleural effusion. No pneumothorax. Degenerative changes thoracic spine. IMPRESSION: 1.  Stable  cardiomegaly.  No pulmonary venous congestion. 2. Prominent bibasilar pulmonary interstitial infiltrates/edema. Small right pleural effusion. Electronically Signed   By: Marcello Moores  Register   On: 09/15/2019 11:39    Procedures Procedures (including critical care time)  Medications Ordered in UC Medications - No data to display  Initial Impression / Assessment and Plan / UC Course  I have reviewed the triage vital signs and the nursing notes.  Pertinent labs & imaging results that were available during my care of the patient were reviewed by me and considered in my medical decision making (see chart for details).  Clinical Course as of Sep 14 1404  Fri Sep 15, 2019  1105 SpO2: 95 % [YN]    Clinical Course User Index [YN] Raylene Everts, MD   Patient came in with oxygen in the 89-90 range.  After resting it did come up to 93%.  When I examined the patient in the room I took off her oxygen and her resting oxygen was 90%.  She walked back for her x-ray become being more and more dyspneic.  At this point her oxygen was 86% and her respiratory rate was 30.  Because of her high risk, I think she will require admission  Final Clinical Impressions(s) / UC Diagnoses   Final diagnoses:  Pneumonia due to COVID-19 virus  Hypoxia     Discharge Instructions     You need to go to the Emergency room I believe you will need to stay in the hospital    ED Prescriptions    None     PDMP not reviewed this encounter.   Raylene Everts, MD 09/15/19 [phone]

## 2019-09-15 NOTE — ED Notes (Addendum)
Patient noted to be 86% on room air with ambulation on her way back from x-ray. Patient is being discharged from the [facility] and sent to the Emergency Department via wheelchair by staff. Per Dr. Meda Coffee, patient is stable but in need of higher level of care due to desaturation during ambulation with COVID-19 positive test result and bilateral covid pneumonia. Patient is aware and verbalizes understanding of plan of care.  Vitals:   09/15/19 1120 09/15/19 1121  BP:    Pulse:    Resp: (!) 22 (!) 22  Temp:    SpO2: 90% 95%

## 2019-09-15 NOTE — ED Triage Notes (Signed)
Pt present coughing and back pain. Symptoms started over a week ago. Pt states that she cough so hard that it hurts her back. Pt is also congested with some wheezing.

## 2019-09-15 NOTE — ED Notes (Signed)
Oxygen administered by nasal canula 2 liters

## 2019-09-15 NOTE — ED Notes (Signed)
Pt reports that she had a CXR at [facility]

## 2019-09-15 NOTE — ED Provider Notes (Signed)
[facility] EMERGENCY DEPARTMENT Provider Note   CSN: [id number] Arrival date & time: 09/15/19  1203     History Chief Complaint  Patient presents with  . Back Pain    [name] is a 69 y.o. female presenting for evaluation of low back pain and shortness of breath.  Patient states she started develop Covid symptoms a week ago.  She reports cough and low back pain, worse with coughing.  She reports generalized weakness and body aches.  Started last night, became she has been more short of breath, specifically with exertion.  She had negative Covid test early in her symptoms onset which was negative.  She was seen at urgent care earlier today, where she had a positive Covid test.  She had an x-ray which showed bilateral infiltrates/edema and a small right pleural effusion.  During her visit at the urgent care, patient ambulated and sats dropped to 86%.  As such, she was instructed to come to the emergency room.  She denies current fevers, chills, chest pain, nausea, vomiting, bone pain, urinary symptoms, abnormal bowel movements.  She is not taking anything for her symptoms.  She denies history of lung problems including COPD or asthma.  She does not wear oxygen at baseline.  She denies sick contacts.  Additional history obtained from chart review.  Patient with a history of CHF, diabetes, gastroparesis, hypertension.  She reports a history of breast cancer status post resection and radiation several years ago.  She is not currently on chemo, radiation, or immunosuppression   HPI     Past Medical History:  Diagnosis Date  . Anxiety   . Arthritis    osteoarthritis  . Breast cancer ([place]) 2008   right, ER/PR -  . CHF (congestive heart failure) ([place])   . Depression   . Diabetes mellitus    TYPE 2  . Gastroparesis   . Hx of radiation therapy 04/04/07 to 05/20/07   right breast/6260 cGy  . Hypercholesterolemia   . Hypertension   . Lumbago   . Malignant  neoplasm of breast (female), unspecified site   . Nonspecific elevation of levels of transaminase or lactic acid dehydrogenase (LDH)   . Osteoarthrosis, unspecified whether generalized or localized, unspecified site   . Regional enteritis of unspecified site   . Restless legs syndrome (RLS)   . TIA (transient ischemic attack)   . Urinary frequency   . Uterine prolapse without mention of vaginal wall prolapse   . Vitamin deficiency     Patient Active Problem List   Diagnosis Date Noted  . Anemia, chronic disease 10/15/2017  . Type 2 diabetes mellitus without complication, without long-term current use of insulin ([place]) 10/15/2017  . Hyperlipidemia associated with type 2 diabetes mellitus ([place]) 10/15/2017  . Claustrophobia 07/05/2015  . Bilateral edema of lower extremity 09/11/2014  . CKD stage 3 due to type 2 diabetes mellitus ([place]) 03/07/2014  . Restless leg syndrome 11/29/2013  . Other and unspecified hyperlipidemia 11/29/2013  . Osteoarthritis 11/29/2013  . CKD (chronic kidney disease) 08/16/2013  . Insomnia 08/16/2013  . Atrial flutter- not confirmed on ECG review 12/15/2012  . Loss of weight 12/15/2012  . Gastroparesis 12/14/2012  . Renal impairment 12/14/2012  . History of malignant neoplasm of right breast 01/01/2012  . Arthritis   . Hx of radiation therapy   . OSTEOARTHRITIS, HANDS, BILATERAL 05/29/2009  . CHEST PAIN, ATYPICAL 02/09/2008  . LOW BACK PAIN, ACUTE 01/03/2007  . Diabetes mellitus due to  underlying condition with diabetic nephropathy ([place]) 11/04/2006  . HYPERCHOLESTEROLEMIA 11/04/2006  . HYPERTENSION, BENIGN SYSTEMIC 11/04/2006  . Cardiomyopathy-recovered 11/04/2006  . MENOPAUSAL SYNDROME 11/04/2006  . OSTEOARTHRITIS, LOWER LEG 11/04/2006  . GAS/BLOATING 11/04/2006    Past Surgical History:  Procedure Laterality Date  . ABDOMINAL HYSTERECTOMY  1997   TAH/BSO, ENDOMETRIAL SARCOMA  . ARTHROPLASTY     left knee  . BREAST LUMPECTOMY Right 11/17/06    re-excision 01/13/07  . RADICAL HYSTERECTOMY  1997  . TOTAL KNEE ARTHROPLASTY Left 06/10/2011      . TOTAL KNEE ARTHROPLASTY Right 2006, 2009   Dr Para March  . TOTAL KNEE ARTHROPLASTY Left 2008   Dr Para March  . WRIST SURGERY     carpel tunnel     OB History   No obstetric history on file.     Family History  Problem Relation Age of Onset  . Diabetes Mother   . Diabetes Father   . Aneurysm Sister   . Arthritis Sister   . Diabetes Brother   . Heart Problems Brother   . Colon cancer Neg Hx     Social History   Tobacco Use  . Smoking status: Former Smoker    Packs/day: 0.50    Years: 20.00    Pack years: 10.00    Quit date: 09/08/1991    Years since quitting: 28.0  . Smokeless tobacco: Never Used  Substance Use Topics  . Alcohol use: No  . Drug use: No    Home Medications Prior to Admission medications   Medication Sig Start Date End Date Taking? Authorizing Provider  Accu-Chek FastClix Lancets MISC Check blood sugar once daily as directed E11.59 11/11/18   Reed, Tiffany L, DO  amLODipine (NORVASC) 10 MG tablet Take 0.5 tablets (5 mg total) by mouth daily. 06/01/19   Lauree Chandler, NP  apixaban (ELIQUIS) 5 MG TABS tablet Take 1 tablet (5 mg total) by mouth 2 (two) times daily. 08/22/19   Deboraha Sprang, MD  atorvastatin (LIPITOR) 40 MG tablet TAKE 1 TABLET DAILY FOR    CHOLESTEROL 08/21/19   Lauree Chandler, NP  Blood Glucose Monitoring Suppl (ACCU-CHEK AVIVA PLUS) w/Device KIT Check blood sugar once daily as directed E11.59  Acc-Chek guide 11/11/18   Reed, Tiffany L, DO  carvedilol (COREG) 25 MG tablet Take 1 tablet (25 mg total) by mouth 2 (two) times daily with a meal. 07/25/19   Lauree Chandler, NP  Ferrous Sulfate (IRON) 325 (65 Fe) MG TABS Take by mouth daily.    [provider]  glucose blood (ACCU-CHEK GUIDE) test strip Use as instructed Check blood sugar once daily as directed E11.59 11/11/18   Reed, Tiffany L, DO  losartan (COZAAR) 100 MG tablet TAKE 1  TABLET BY MOUTH DAILY TO CONTROL BLOOD PRESSURE. 06/15/19   Deboraha Sprang, MD  Menthol, Topical Analgesic, (BIOFREEZE) 4 % GEL Apply 3 oz topically 3 (three) times daily as needed. Apply to both knees for pain 08/18/19   Ngetich, Dinah C, NP  metFORMIN (GLUCOPHAGE) 1000 MG tablet Take 1,000 mg by mouth daily with breakfast.    [provider]  rOPINIRole (REQUIP) 0.25 MG tablet Take 1 tablet (0.25 mg total) by mouth at bedtime. For restless legs 05/05/19   Lauree Chandler, NP  traMADol (ULTRAM) 50 MG tablet Take 1 tablet (50 mg total) by mouth 2 (two) times daily as needed. Take one tablet ( 50 mg total) by mouth once daily as needed. 08/18/19 09/17/19  Ngetich, Dinah C, NP    Allergies    Hydrocodone and Lisinopril  Review of Systems   Review of Systems  Respiratory: Positive for cough and shortness of breath.   Musculoskeletal: Positive for back pain.  Neurological: Positive for weakness.  All other systems reviewed and are negative.   Physical Exam Updated Vital Signs BP (!) 148/70   Pulse 77   Temp 99.3 F (37.4 C) (Oral)   Resp (!) 31   Wt 87.1 kg   SpO2 96%   BMI 30.99 kg/m   Physical Exam Vitals and nursing note reviewed.  Constitutional:      General: She is not in acute distress.    Appearance: She is well-developed.     Comments: Sitting comfortably in the bed in no acute distress.  Feels warm to the touch  HENT:     Head: Normocephalic and atraumatic.  Eyes:     Extraocular Movements: Extraocular movements intact.     Conjunctiva/sclera: Conjunctivae normal.     Pupils: Pupils are equal, round, and reactive to light.  Cardiovascular:     Rate and Rhythm: Normal rate and regular rhythm.     Pulses: Normal pulses.  Pulmonary:     Effort: Pulmonary effort is normal. No respiratory distress.     Breath sounds: No wheezing.     Comments: Crackles heard in bilateral lower bases.  Speaking in full sentences.  Sats stable on 2 L via nasal  cannula. Abdominal:     General: There is no distension.     Palpations: Abdomen is soft. There is no mass.     Tenderness: There is no abdominal tenderness. There is no guarding or rebound.  Musculoskeletal:        General: Normal range of motion.     Cervical back: Normal range of motion and neck supple.     Right lower leg: No edema.     Left lower leg: No edema.     Comments: Tenderness palpation of left low back musculature.  No pain over midline spine.  No step-offs or deformities.  Skin:    General: Skin is warm and dry.     Capillary Refill: Capillary refill takes less than 2 seconds.  Neurological:     Mental Status: She is alert and oriented to person, place, and time.     ED Results / Procedures / Treatments   Labs (all labs ordered are listed, but only abnormal results are displayed) Labs Reviewed  CBC - Abnormal; Notable for the following components:      Result Value   RBC 3.47 (*)    Hemoglobin 11.2 (*)    HCT 33.8 (*)    All other components within normal limits  BASIC METABOLIC PANEL - Abnormal; Notable for the following components:   Sodium 134 (*)    Glucose, Bld 162 (*)    BUN 25 (*)    Creatinine, Ser 1.37 (*)    GFR calc non Af Amer 39 (*)    GFR calc Af Amer 45 (*)    All other components within normal limits  CULTURE, BLOOD (ROUTINE X 2)  CULTURE, BLOOD (ROUTINE X 2)  URINALYSIS, ROUTINE W REFLEX MICROSCOPIC  LACTIC ACID, PLASMA  LACTIC ACID, PLASMA  D-DIMER, QUANTITATIVE (NOT AT [facility])  PROCALCITONIN  LACTATE DEHYDROGENASE  FERRITIN  TRIGLYCERIDES  FIBRINOGEN  C-REACTIVE PROTEIN  HEPATIC FUNCTION PANEL    EKG None  Radiology DG Chest 2 View  Result Date: 09/15/2019 CLINICAL DATA:  Back pain.  COVID positive. EXAM: CHEST - 2 VIEW COMPARISON:  Chest x-ray 09/27/2015. FINDINGS: Mediastinum hilar structures normal. Stable cardiomegaly. No pulmonary venous congestion. Prominent bibasilar pulmonary interstitial infiltrates/edema. Small right  pleural effusion. No pneumothorax. Degenerative changes thoracic spine. IMPRESSION: 1.  Stable cardiomegaly.  No pulmonary venous congestion. 2. Prominent bibasilar pulmonary interstitial infiltrates/edema. Small right pleural effusion. Electronically Signed   By: Marcello Moores  Register   On: 09/15/2019 11:39    Procedures Procedures (including critical care time)  Medications Ordered in ED Medications  acetaminophen (TYLENOL) tablet 650 mg (650 mg Oral Given 09/15/19 1425)    ED Course  I have reviewed the triage vital signs and the nursing notes.  Pertinent labs & imaging results that were available during my care of the patient were reviewed by me and considered in my medical decision making (see chart for details).    MDM Rules/Calculators/A&P                      Patient presenting for evaluation of cough, shortness of breath, back pain.  Physical examination, she appears nontoxic.  However, she did desat with ambulation at the urgent care, and is currently on 2 L.  She does not wear oxygen at home.  She has mild crackles in bilateral lower bases, as well as an x-ray which shows infiltrate/edema and effusion.  Considering her age, risk factors, and Covid positive status, she will likely need to be admitted due to desaturation with ambulation. Back pain is most consistent with MSK pain.  It worsens when she coughs, and is reproducible with exam.  Doubt discitis or epidural abscess, especially in the setting of a Covid positive patient. Labs obtained from triage show slight dehydration, but otherwise reassuring.  Would not repeat x-ray, it was performed earlier today.  Will call for admission.  Discussed with Dr. Tamala Julian from [facility] hospitalist service, patient to be admitted.  Final Clinical Impression(s) / ED Diagnoses Final diagnoses:  COVID-19  Pneumonia due to COVID-19 virus  Acute left-sided low back pain without sciatica    Rx / DC Orders ED Discharge Orders    None        Franchot Heidelberg, PA-C 09/15/19 1532    Hayden Rasmussen, MD 09/15/19 1733

## 2019-09-15 NOTE — Discharge Instructions (Signed)
You need to go to the Emergency room I believe you will need to stay in the hospital

## 2019-09-16 ENCOUNTER — Other Ambulatory Visit: Payer: Self-pay

## 2019-09-16 LAB — COMPREHENSIVE METABOLIC PANEL
ALT: 30 U/L (ref 0–44)
AST: 44 U/L — ABNORMAL HIGH (ref 15–41)
Albumin: 3 g/dL — ABNORMAL LOW (ref 3.5–5.0)
Alkaline Phosphatase: 55 U/L (ref 38–126)
Anion gap: 13 (ref 5–15)
BUN: 19 mg/dL (ref 8–23)
CO2: 20 mmol/L — ABNORMAL LOW (ref 22–32)
Calcium: 8.5 mg/dL — ABNORMAL LOW (ref 8.9–10.3)
Chloride: 103 mmol/L (ref 98–111)
Creatinine, Ser: 0.98 mg/dL (ref 0.44–1.00)
GFR calc Af Amer: >60 mL/min (ref >60)
GFR calc non Af Amer: 58 mL/min — ABNORMAL LOW (ref >60)
Glucose, Bld: 250 mg/dL — ABNORMAL HIGH (ref 70–99)
Potassium: 4.1 mmol/L (ref 3.5–5.1)
Sodium: 136 mmol/L (ref 135–145)
Total Bilirubin: 0.4 mg/dL (ref 0.3–1.2)
Total Protein: 6.5 g/dL (ref 6.5–8.1)

## 2019-09-16 LAB — CBC WITH DIFFERENTIAL/PLATELET
Abs Immature Granulocytes: 0.02 10*3/uL (ref 0.00–0.07)
Basophils Absolute: 0 10*3/uL (ref 0.0–0.1)
Basophils Relative: 0 %
Eosinophils Absolute: 0 10*3/uL (ref 0.0–0.5)
Eosinophils Relative: 0 %
HCT: 31.6 % — ABNORMAL LOW (ref 36.0–46.0)
Hemoglobin: 10.9 g/dL — ABNORMAL LOW (ref 12.0–15.0)
Immature Granulocytes: 1 %
Lymphocytes Relative: 13 %
Lymphs Abs: 0.4 10*3/uL — ABNORMAL LOW (ref 0.7–4.0)
MCH: 32.4 pg (ref 26.0–34.0)
MCHC: 34.5 g/dL (ref 30.0–36.0)
MCV: 94 fL (ref 80.0–100.0)
Monocytes Absolute: 0.1 10*3/uL (ref 0.1–1.0)
Monocytes Relative: 4 %
Neutro Abs: 2.8 10*3/uL (ref 1.7–7.7)
Neutrophils Relative %: 82 %
Platelets: 212 10*3/uL (ref 150–400)
RBC: 3.36 MIL/uL — ABNORMAL LOW (ref 3.87–5.11)
RDW: 12.5 % (ref 11.5–15.5)
WBC: 3.4 10*3/uL — ABNORMAL LOW (ref 4.0–10.5)
nRBC: 0 % (ref 0.0–0.2)

## 2019-09-16 LAB — GLUCOSE, CAPILLARY
Glucose-Capillary: 266 mg/dL — ABNORMAL HIGH (ref 70–99)
Glucose-Capillary: 193 mg/dL — ABNORMAL HIGH (ref 70–99)
Glucose-Capillary: 211 mg/dL — ABNORMAL HIGH (ref 70–99)
Glucose-Capillary: 212 mg/dL — ABNORMAL HIGH (ref 70–99)
Glucose-Capillary: 258 mg/dL — ABNORMAL HIGH (ref 70–99)

## 2019-09-16 LAB — HEMOGLOBIN A1C
Hgb A1c MFr Bld: 7 % — ABNORMAL HIGH (ref 4.8–5.6)
Mean Plasma Glucose: 154.2 mg/dL

## 2019-09-16 LAB — BRAIN NATRIURETIC PEPTIDE: B Natriuretic Peptide: 108.4 pg/mL — ABNORMAL HIGH (ref 0.0–100.0)

## 2019-09-16 LAB — C-REACTIVE PROTEIN: CRP: 14.3 mg/dL — ABNORMAL HIGH (ref <1.0)

## 2019-09-16 LAB — PHOSPHORUS: Phosphorus: 3 mg/dL (ref 2.5–4.6)

## 2019-09-16 LAB — D-DIMER, QUANTITATIVE: D-Dimer, Quant: 0.64 ug/mL-FEU — ABNORMAL HIGH (ref 0.00–0.50)

## 2019-09-16 LAB — FERRITIN: Ferritin: 489 ng/mL — ABNORMAL HIGH (ref 11–307)

## 2019-09-16 LAB — MAGNESIUM: Magnesium: 1.7 mg/dL (ref 1.7–2.4)

## 2019-09-16 MED ORDER — ORAL CARE MOUTH RINSE
15.0000 mL | Freq: Two times a day (BID) | OROMUCOSAL | Status: DC
  Administered 2019-09-16 – 2019-09-23 (×15): 15 mL via OROMUCOSAL

## 2019-09-16 NOTE — Plan of Care (Signed)

## 2019-09-16 NOTE — Progress Notes (Addendum)
[name] [id number] Admission Data: 09/16/2019 4:40 AM Attending Provider: Norval Morton, MD  [id number], Makayla American, NP Consults/ Treatment Team:   [name] is a 69 y.o. female patient admitted from ED awake, alert  & orientated  X 3,  Full Code, VSS - Blood pressure 134/66, pulse 86, temperature 99.1 F (37.3 C), temperature source Oral, resp. rate 14, height 5\' 6"  (1.676 m), weight 79.8 kg, SpO2 (!) 88 %., O2    3 L nasal cannular, no c/o shortness of breath, no c/o chest pain, no distress noted. Tele # [id number] placed and pt is currently running:normal sinus rhythm.   IV site WDL:  forearm right, condition patent and no redness and left, condition patent and no redness and antecubital right, condition patent and no redness and left, condition patent and no redness with a transparent dsg that's clean dry and intact.  Allergies:   Allergies  Allergen Reactions  . Hydrocodone Nausea And Vomiting  . Lisinopril     Sick on the stomach     Past Medical History:  Diagnosis Date  . Anxiety   . Arthritis    osteoarthritis  . Breast cancer ([place]) 2008   right, ER/PR -  . CHF (congestive heart failure) ([place])   . Depression   . Diabetes mellitus    TYPE 2  . Gastroparesis   . Hx of radiation therapy 04/04/07 to 05/20/07   right breast/6260 cGy  . Hypercholesterolemia   . Hypertension   . Lumbago   . Malignant neoplasm of breast (female), unspecified site   . Nonspecific elevation of levels of transaminase or lactic acid dehydrogenase (LDH)   . Osteoarthrosis, unspecified whether generalized or localized, unspecified site   . Regional enteritis of unspecified site   . Restless legs syndrome (RLS)   . TIA (transient ischemic attack)   . Urinary frequency   . Uterine prolapse without mention of vaginal wall prolapse   . Vitamin deficiency     History:  obtained from chart review. Tobacco/alcohol:  none  Pt orientation to unit, room and routine. Information packet  given to patient/family and safety video watched.  Admission INP armband ID verified with patient/family, and in place. SR up x 2, fall risk assessment complete with Patient and family verbalizing understanding of risks associated with falls. Pt verbalizes an understanding of how to use the call bell and to call for help before getting out of bed.  Skin, clean-dry- intact without evidence of bruising, or skin tears.   No evidence of skin break down noted on exam. no rashes, no ecchymoses, no petechiae, no nodules, no jaundice, no purpura, no wounds, no acanthosis nigricans, no striae    Will cont to monitor and assist as needed.  Makayla Bowie, RN 09/16/2019 4:40 AM   Vital Signs MEWS/VS Documentation      09/16/2019 0030 09/16/2019 0151 09/16/2019 0152 09/16/2019 0200   MEWS Score:  2  2  0  0   MEWS Score Color:  Yellow  Yellow  Green  Green   Resp:  (!) 33  --  20  14   Pulse:  74  --  83  86   BP:  137/63  --  (!) 141/64  134/66   Temp:  --  --  99 F (37.2 C)  99.1 F (37.3 C)   O2 Device:  --  --  Nasal Cannula  Nasal Cannula   O2 Flow Rate (L/min):  --  --  --  3 L/min   Level of Consciousness:  --  --  --  Alert           [name] 09/16/2019,4:37 AM

## 2019-09-16 NOTE — Progress Notes (Signed)
   09/16/19 1009  Clinical Encounter Type  Visited With Health care provider  Visit Type Initial  Referral From Nurse   Chaplain called patient's RN and spoke with her to learn more about the best way to facilitate an advanced directive with the current safety protocols and COVID-19. Patient's RN was going to check with charge nurse for guidance. Chaplain will seek to follow-up. Spiritual care services available as needed.   Jeri Lager, Chaplain

## 2019-09-16 NOTE — Progress Notes (Signed)
PROGRESS NOTE    [name]  [id number] DOB: 03-14-1951 DOA: 09/15/2019 PCP: Lauree Chandler, NP  Brief Narrative:  [name] is a 69 yr old woman with PMH of HTN, Dm2, CAD, PAF on anticoagulation, chronic back pain, TIA, breast cancer status post lumpectomy with radiation, and depression.  She presented with 1 week history of cough and lower back pain.  She reports having a productive cough that has caused her to have mid to lower back pain. O2 sats noted to be low in the 86% range on RA. COVID-19 positive with mildly elevated pct, on Rocephin and Azithromycin.     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   Chest x-ray showing bilateral pulmonary opacities concerning for infection versus edema. -Continue COVID-19 treatment plan with:  -Albuterol inhaler -Decadron 6 mg IV daily -Remdesivir -Empiric antibiotics of Rocephin and azithromycin due to mildly elevated procalcitonin -Antitussives as needed -Continue daily monitoring of inflammatory markers -Wean off O2 supplementation as tolerated.   Essential hypertension: Blood pressures initially elevated up to 175/83.  Home blood pressure medications include amlodipine 10 mg daily, Coreg 25 mg twice daily, losartan 100 mg daily -Continue Coreg and amlodipine -Holding losartan  Diabetes mellitus type 2 (controlled): Patient appears well controlled on oral medications of metformin.  Last hemoglobin A1c noted to be 6.4 in 04/2019 -Hypoglycemic protocol -Hold Metformin -Continue CBGs  before every meal with sensitive SSI  Dehydration:  Patient presented with creatinine elevated at 1.37 with BUN 25. Has poor intake per mouth due to her acute illness.   Received IV fluids, Scr now back to baseline.   Paroxysmal atrial fibrillation on chronic anticoagulation: Patient on anticoagulation of Eliquis.  She appears to be in sinus rhythm at this time. -Continue Eliquis  Elevated AST: Acute.  On admission AST mildly elevated at 52.  Patient denies any history of alcohol abuse. -Continue to monitor while on remdesivir   Hyperlipidemia -Continue atorvastatin  Restless leg syndrome -Continue ropinirole  DVT prophylaxis: Eliquis Code Status: full  Family Communication: Discussed with the patient  Disposition Plan: DC home one clinically improved.  Consults called: None Procedures: None Antimicrobials:  Rocephin and Azithromycin   Subjective: She feels tired but reports feeling better. Her appetite is slightly improved.   Objective: Vitals:   09/16/19 0152 09/16/19 0200 09/16/19 1236 09/16/19 1500  BP: (!) 141/64 134/66 132/64 133/67  Pulse: 83 86 73 75  Resp: 20 14 (!) 27 (!) 24  Temp: 99 F (37.2 C) 99.1 F (37.3 C) 99.3 F (37.4 C) 99.6 F (37.6 C)  TempSrc: Oral Oral Oral Oral  SpO2: 95% (!) 88% 93% 94%  Weight:  79.8 kg    Height:  5\' 6"  (1.676 m)      Intake/Output Summary (Last 24 hours) at 09/16/2019 1838 Last data filed at 09/16/2019 1500 Gross per 24 hour  Intake 1873.64 ml  Output 200 ml  Net 1673.64 ml   Filed Weights   09/15/19 1222 09/16/19 0200  Weight: 87.1 kg 79.8 kg    Examination:  General exam: Appears calm and comfortable  Respiratory system: Respiratory effort normal. No wheezing.  Cardiovascular system: S1 & S2 present, RRR.  Gastrointestinal system: Abdomen is nondistended, soft and nontender.  Central nervous system: Alert and oriented. No focal neurological deficits. Extremities: Symmetric 5 x 5 power. Skin: No rashes,  lesions or ulcers Psychiatry: Mood & affect appropriate.     Data Reviewed: I have personally reviewed following labs and imaging studies  CBC: Recent Labs  Lab 09/15/19 1230 09/16/19 0450  WBC 5.0 3.4*  NEUTROABS  --  2.8  HGB 11.2* 10.9*  HCT 33.8* 31.6*  MCV 97.4 94.0  PLT 190 [id number]   Basic Metabolic Panel: Recent Labs  Lab 09/15/19 1230 09/16/19 0450  NA 134* 136  K 3.9 4.1  CL 102 103  CO2 22 20*  GLUCOSE 162* 250*  BUN 25* 19  CREATININE 1.37* 0.98  CALCIUM 8.9 8.5*  MG  --  1.7  PHOS  --  3.0   GFR: Estimated Creatinine Clearance: 56.9 mL/min (by C-G formula based on SCr of 0.98 mg/dL). Liver Function Tests: Recent Labs  Lab 09/15/19 1450 09/16/19 0450  AST 52* 44*  ALT 31 30  ALKPHOS 64 55  BILITOT 0.7 0.4  PROT 7.3 6.5  ALBUMIN 3.5 3.0*   No results for input(s): LIPASE, AMYLASE in the last 168 hours. No results for input(s): AMMONIA in the last 168 hours. Coagulation Profile: No results for input(s): INR, PROTIME in the last 168 hours. Cardiac Enzymes: No results for input(s): CKTOTAL, CKMB, CKMBINDEX, TROPONINI in the last 168 hours. BNP (last 3 results) No results for input(s): PROBNP in the last 8760 hours. HbA1C: Recent Labs    09/16/19 0503  HGBA1C 7.0*   CBG: Recent Labs  Lab 09/15/19 2056 09/16/19 0230 09/16/19 0822 09/16/19 1106 09/16/19 1627  GLUCAP 142* 266* 193* 211* 212*   Lipid Profile: Recent Labs    09/15/19 1450  TRIG 96   Thyroid Function Tests: No results for input(s): TSH, T4TOTAL, FREET4, T3FREE, THYROIDAB in the last 72 hours. Anemia Panel: Recent Labs    09/15/19 1450 09/16/19 0450  FERRITIN 462* 489*   Sepsis Labs: Recent Labs  Lab 09/15/19 1450 09/15/19 1610  PROCALCITON 0.36  --   LATICACIDVEN 1.2 0.8    Recent Results (from the past 240 hour(s))  Blood Culture (routine x 2)     Status: None (Preliminary result)   Collection Time: 09/15/19  2:50 PM   Specimen: BLOOD  Result Value Ref  Range Status   Specimen Description BLOOD LEFT ANTECUBITAL  Final   Special Requests   Final    BOTTLES DRAWN AEROBIC AND ANAEROBIC Blood Culture results may not be optimal due to an inadequate volume of blood received in culture bottles   Culture   Final    NO GROWTH < 24 HOURS Performed at [facility] [address], [place], [place] [address]    Report Status PENDING  Incomplete  Blood Culture (routine x 2)     Status: None (Preliminary result)   Collection Time: 09/15/19  2:51 PM   Specimen: BLOOD  Result Value Ref Range Status   Specimen Description BLOOD BLOOD RIGHT FOREARM  Final   Special Requests   Final    BOTTLES DRAWN AEROBIC AND ANAEROBIC Blood Culture adequate volume   Culture   Final    NO GROWTH < 24 HOURS Performed at [facility], [place] [address], [place], [place] [address]    Report Status PENDING  Incomplete  Radiology Studies: DG Chest 2 View  Result Date: 09/15/2019 CLINICAL DATA:  Back pain.  COVID positive. EXAM: CHEST - 2 VIEW COMPARISON:  Chest x-ray 09/27/2015. FINDINGS: Mediastinum hilar structures normal. Stable cardiomegaly. No pulmonary venous congestion. Prominent bibasilar pulmonary interstitial infiltrates/edema. Small right pleural effusion. No pneumothorax. Degenerative changes thoracic spine. IMPRESSION: 1.  Stable cardiomegaly.  No pulmonary venous congestion. 2. Prominent bibasilar pulmonary interstitial infiltrates/edema. Small right pleural effusion. Electronically Signed   By: Marcello Moores  Register   On: 09/15/2019 11:39        Scheduled Meds: . albuterol  2 puff Inhalation Q6H  . amLODipine  5 mg Oral Daily  . apixaban  5 mg Oral BID  . vitamin C  500 mg Oral Daily  . atorvastatin  40 mg Oral q1800  . carvedilol  25 mg Oral BID WC  . dexamethasone (DECADRON) injection  6 mg Intravenous Q24H  . insulin aspart  0-5 Units Subcutaneous QHS  . insulin aspart  0-9 Units Subcutaneous TID WC  . mouth rinse  15 mL Mouth  Rinse BID  . rOPINIRole  0.25 mg Oral QHS  . sodium chloride flush  3 mL Intravenous Q12H  . zinc sulfate  220 mg Oral Daily   Continuous Infusions: . azithromycin 500 mg (09/16/19 1757)  . cefTRIAXone (ROCEPHIN)  IV 2 g (09/16/19 1720)  . remdesivir 100 mg in NS 100 mL 100 mg (09/16/19 1646)     LOS: 1 day    Time spent: 35 minutes    Blain Pais, MD [facility]   If 7PM-7AM, please contact night-coverage [email or web address] Password [facility] 09/16/2019, 6:38 PM

## 2019-09-16 NOTE — Progress Notes (Signed)
   Vital Signs MEWS/VS Documentation      09/16/2019 1236 09/16/2019 1500 09/16/2019 1902 09/16/2019 1925   MEWS Score:  2  1  1   0   MEWS Score Color:  Yellow  Green  Green  Green   Resp:  (!) 27  (!) 24  --  16   Pulse:  73  75  --  77   BP:  132/64  133/67  --  129/61   Temp:  99.3 F (37.4 C)  99.6 F (37.6 C)  --  98 F (36.7 C)   O2 Device:  --  Nasal Cannula  --  Nasal Cannula   O2 Flow Rate (L/min):  --  3 L/min  --  3 L/min   Level of Consciousness:  --  --  --  Vonzell Schlatter D Rayquan Amrhein 09/16/2019,11:24 PM

## 2019-09-17 LAB — C-REACTIVE PROTEIN: CRP: 11.6 mg/dL — ABNORMAL HIGH (ref <1.0)

## 2019-09-17 LAB — MAGNESIUM: Magnesium: 1.8 mg/dL (ref 1.7–2.4)

## 2019-09-17 LAB — CBC WITH DIFFERENTIAL/PLATELET
Abs Immature Granulocytes: 0.02 10*3/uL (ref 0.00–0.07)
Basophils Absolute: 0 10*3/uL (ref 0.0–0.1)
Basophils Relative: 0 %
Eosinophils Absolute: 0 10*3/uL (ref 0.0–0.5)
Eosinophils Relative: 0 %
HCT: 34.4 % — ABNORMAL LOW (ref 36.0–46.0)
Hemoglobin: 11.7 g/dL — ABNORMAL LOW (ref 12.0–15.0)
Immature Granulocytes: 0 %
Lymphocytes Relative: 11 %
Lymphs Abs: 0.5 10*3/uL — ABNORMAL LOW (ref 0.7–4.0)
MCH: 31.8 pg (ref 26.0–34.0)
MCHC: 34 g/dL (ref 30.0–36.0)
MCV: 93.5 fL (ref 80.0–100.0)
Monocytes Absolute: 0.2 10*3/uL (ref 0.1–1.0)
Monocytes Relative: 4 %
Neutro Abs: 4.2 10*3/uL (ref 1.7–7.7)
Neutrophils Relative %: 85 %
Platelets: 207 10*3/uL (ref 150–400)
RBC: 3.68 MIL/uL — ABNORMAL LOW (ref 3.87–5.11)
RDW: 12.5 % (ref 11.5–15.5)
WBC: 4.9 10*3/uL (ref 4.0–10.5)
nRBC: 0 % (ref 0.0–0.2)

## 2019-09-17 LAB — COMPREHENSIVE METABOLIC PANEL
ALT: 29 U/L (ref 0–44)
AST: 41 U/L (ref 15–41)
Albumin: 2.9 g/dL — ABNORMAL LOW (ref 3.5–5.0)
Alkaline Phosphatase: 61 U/L (ref 38–126)
Anion gap: 11 (ref 5–15)
BUN: 22 mg/dL (ref 8–23)
CO2: 21 mmol/L — ABNORMAL LOW (ref 22–32)
Calcium: 8.7 mg/dL — ABNORMAL LOW (ref 8.9–10.3)
Chloride: 104 mmol/L (ref 98–111)
Creatinine, Ser: 0.99 mg/dL (ref 0.44–1.00)
GFR calc Af Amer: >60 mL/min (ref >60)
GFR calc non Af Amer: 58 mL/min — ABNORMAL LOW (ref >60)
Glucose, Bld: 285 mg/dL — ABNORMAL HIGH (ref 70–99)
Potassium: 4.1 mmol/L (ref 3.5–5.1)
Sodium: 136 mmol/L (ref 135–145)
Total Bilirubin: 0.6 mg/dL (ref 0.3–1.2)
Total Protein: 6.6 g/dL (ref 6.5–8.1)

## 2019-09-17 LAB — PHOSPHORUS: Phosphorus: 2.3 mg/dL — ABNORMAL LOW (ref 2.5–4.6)

## 2019-09-17 LAB — D-DIMER, QUANTITATIVE: D-Dimer, Quant: 1.36 ug/mL-FEU — ABNORMAL HIGH (ref 0.00–0.50)

## 2019-09-17 LAB — GLUCOSE, CAPILLARY
Glucose-Capillary: 280 mg/dL — ABNORMAL HIGH (ref 70–99)
Glucose-Capillary: 203 mg/dL — ABNORMAL HIGH (ref 70–99)
Glucose-Capillary: 200 mg/dL — ABNORMAL HIGH (ref 70–99)
Glucose-Capillary: 200 mg/dL — ABNORMAL HIGH (ref 70–99)

## 2019-09-17 LAB — FERRITIN: Ferritin: 426 ng/mL — ABNORMAL HIGH (ref 11–307)

## 2019-09-17 NOTE — Progress Notes (Signed)
PROGRESS NOTE    [name]  [id number] DOB: 1950/11/12 DOA: 09/15/2019 PCP: Makayla Chandler, NP  Brief Narrative:  [name] is a 69 yr old woman with PMH of HTN, Dm2, CAD, PAF on anticoagulation, chronic back pain, TIA, breast cancer status post lumpectomy with radiation, and depression.  She presented with 1 week history of cough and lower back pain.  She reports having a productive cough that has caused her to have mid to lower back pain. O2 sats noted to be low in the 86% range on RA. COVID-19 positive with mildly elevated pct, on Rocephin and Azithromycin.     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   Chest x-ray showing bilateral pulmonary opacities concerning for infection versus edema. -Continue COVID-19 treatment plan with:  -Albuterol inhaler -Decadron 6 mg IV daily -Remdesivir -Empiric antibiotics of Rocephin and azithromycin due to mildly elevated procalcitonin -Antitussives as needed -Continue daily monitoring of inflammatory markers -Wean off O2 supplementation as tolerated.  -She is gradually improving. -D-dimer elevated but she is on Eliquis. Other inflammatory markers trending down, continue monitoring.   Essential hypertension: Blood pressures initially elevated up to 175/83.  Home blood pressure medications include amlodipine 10 mg daily, Coreg 25 mg twice daily, losartan 100 mg daily -Continue Coreg and amlodipine -Holding losartan  Diabetes mellitus type 2 (controlled): Patient appears well controlled on  oral medications of metformin.  Last hemoglobin A1c noted to be 6.4 in 04/2019 -Hypoglycemic protocol -Hold Metformin -Continue CBGs before every meal with sensitive SSI  Dehydration:  Patient presented with creatinine elevated at 1.37 with BUN 25. Has poor intake per mouth due to her acute illness.   Received IV fluids, Scr now back to baseline.   Paroxysmal atrial fibrillation on chronic anticoagulation: Patient on anticoagulation of Eliquis.  She appears to be in sinus rhythm at this time. -Continue Eliquis  Elevated AST: Acute.  On admission AST mildly elevated at 52.  Patient denies any history of alcohol abuse. -Continue to monitor while on remdesivir   Hyperlipidemia -Continue atorvastatin  Restless leg syndrome -Continue ropinirole  DVT prophylaxis: Eliquis Code Status: full  Family Communication: Discussed with the patient  Disposition Plan: DC home one clinically improved.  Consults called: None Procedures: None Antimicrobials:  Rocephin and Azithromycin   Subjective: She feels slightly better today. Has occasional tachypnea when moving around. Denies diarrhea. Appetite is reported as a little better.   Objective: Vitals:   09/16/19 1500 09/16/19 1925 09/17/19 0456 09/17/19 1450  BP: 133/67 129/61  (!) 146/68  Pulse: 75 77  73  Resp: (!) 24 16  (!) 25  Temp: 99.6 F (37.6 C) 98 F (36.7 C) 98.2 F (36.8 C) 97.8 F (36.6 C)  TempSrc: Oral Oral Oral Oral  SpO2: 94% 91%  92%  Weight:      Height:        Intake/Output Summary (Last 24 hours) at 09/17/2019 1844 Last data filed at 09/17/2019 0817 Gross per 24 hour  Intake 456 ml  Output -  Net 456 ml   Filed Weights   09/15/19  1222 09/16/19 0200  Weight: 87.1 kg 79.8 kg    Examination:  General exam: Appears calm and comfortable  Respiratory system: Respiratory effort normal. No wheezing.  Cardiovascular system: S1 & S2 present, RRR.  Gastrointestinal system: Abdomen is nondistended, soft and  nontender.  Central nervous system: Alert and oriented. No focal neurological deficits. Extremities: Symmetric 5 x 5 power. Skin: No rashes, lesions or ulcers Psychiatry: Mood & affect appropriate.     Data Reviewed: I have personally reviewed following labs and imaging studies  CBC: Recent Labs  Lab 09/15/19 1230 09/16/19 0450 09/17/19 0211  WBC 5.0 3.4* 4.9  NEUTROABS  --  2.8 4.2  HGB 11.2* 10.9* 11.7*  HCT 33.8* 31.6* 34.4*  MCV 97.4 94.0 93.5  PLT 190 212 [id number]   Basic Metabolic Panel: Recent Labs  Lab 09/15/19 1230 09/16/19 0450 09/17/19 0211  NA 134* 136 136  K 3.9 4.1 4.1  CL 102 103 104  CO2 22 20* 21*  GLUCOSE 162* 250* 285*  BUN 25* 19 22  CREATININE 1.37* 0.98 0.99  CALCIUM 8.9 8.5* 8.7*  MG  --  1.7 1.8  PHOS  --  3.0 2.3*   GFR: Estimated Creatinine Clearance: 56.3 mL/min (by C-G formula based on SCr of 0.99 mg/dL). Liver Function Tests: Recent Labs  Lab 09/15/19 1450 09/16/19 0450 09/17/19 0211  AST 52* 44* 41  ALT 31 30 29   ALKPHOS 64 55 61  BILITOT 0.7 0.4 0.6  PROT 7.3 6.5 6.6  ALBUMIN 3.5 3.0* 2.9*   No results for input(s): LIPASE, AMYLASE in the last 168 hours. No results for input(s): AMMONIA in the last 168 hours. Coagulation Profile: No results for input(s): INR, PROTIME in the last 168 hours. Cardiac Enzymes: No results for input(s): CKTOTAL, CKMB, CKMBINDEX, TROPONINI in the last 168 hours. BNP (last 3 results) No results for input(s): PROBNP in the last 8760 hours. HbA1C: Recent Labs    09/16/19 0503  HGBA1C 7.0*   CBG: Recent Labs  Lab 09/16/19 1627 09/16/19 2050 09/17/19 0759 09/17/19 1141 09/17/19 1617  GLUCAP 212* 258* 280* 203* 200*   Lipid Profile: Recent Labs    09/15/19 1450  TRIG 96   Thyroid Function Tests: No results for input(s): TSH, T4TOTAL, FREET4, T3FREE, THYROIDAB in the last 72 hours. Anemia Panel: Recent Labs    09/16/19 0450 09/17/19 0211  FERRITIN 489* 426*   Sepsis Labs:  Recent Labs  Lab 09/15/19 1450 09/15/19 1610  PROCALCITON 0.36  --   LATICACIDVEN 1.2 0.8    Recent Results (from the past 240 hour(s))  Blood Culture (routine x 2)     Status: None (Preliminary result)   Collection Time: 09/15/19  2:50 PM   Specimen: BLOOD  Result Value Ref Range Status   Specimen Description BLOOD LEFT ANTECUBITAL  Final   Special Requests   Final    BOTTLES DRAWN AEROBIC AND ANAEROBIC Blood Culture results may not be optimal due to an inadequate volume of blood received in culture bottles   Culture   Final    NO GROWTH 2 DAYS Performed at [facility] [address] [place], [place] [address]    Report Status PENDING  Incomplete  Blood Culture (routine x 2)     Status: None (Preliminary result)   Collection Time: 09/15/19  2:51 PM   Specimen: BLOOD  Result Value Ref Range Status   Specimen Description BLOOD BLOOD RIGHT FOREARM  Final   Special Requests   Final  BOTTLES DRAWN AEROBIC AND ANAEROBIC Blood Culture adequate volume   Culture   Final    NO GROWTH 2 DAYS Performed at [facility], [place] [address], [place], [place] [address]    Report Status PENDING  Incomplete         Radiology Studies: No results found.      Scheduled Meds: . albuterol  2 puff Inhalation Q6H  . amLODipine  5 mg Oral Daily  . apixaban  5 mg Oral BID  . vitamin C  500 mg Oral Daily  . atorvastatin  40 mg Oral q1800  . carvedilol  25 mg Oral BID WC  . dexamethasone (DECADRON) injection  6 mg Intravenous Q24H  . insulin aspart  0-5 Units Subcutaneous QHS  . insulin aspart  0-9 Units Subcutaneous TID WC  . mouth rinse  15 mL Mouth Rinse BID  . rOPINIRole  0.25 mg Oral QHS  . sodium chloride flush  3 mL Intravenous Q12H  . zinc sulfate  220 mg Oral Daily   Continuous Infusions: . azithromycin 500 mg (09/17/19 1823)  . cefTRIAXone (ROCEPHIN)  IV 2 g (09/17/19 1722)  . remdesivir 100 mg in NS 100 mL 100 mg (09/17/19 1641)     LOS: 2 days     Time spent: 35 minutes    Blain Pais, MD [facility]   If 7PM-7AM, please contact night-coverage [email or web address] Password [facility] 09/17/2019, 6:44 PM

## 2019-09-17 NOTE — Progress Notes (Signed)
Patient stated when she walked to the bathroom early this morning that she felt fine and not winded. When walking to the bathroom later on in the day she became very short of breath, desatted to 86%, and started coughing. Oxygen increased to 4L from 3L for a few minutes until sats increased back to 92%. Robitussin given and effective. Patient has been independent to the bathroom and reminded to call for assistance if she becomes short of breath while ambulating. Will continue to monitor.

## 2019-09-17 NOTE — Progress Notes (Signed)
Chaplain reviewed the spiritual care visit record and noted the need for an AD. We are currently unable to logistically able to complete Advanced Directives on the COVID units due to possible transmission of COVID beyond the unit. Chaplains are happy to assist with completion of these documents after the 21 day quarintine period when [name] has been moved to a regular floor. Chaplains remain available for support as needs arise.   Chaplain Resident, Evelene Croon, [place] [phone] on-call [phone] personal

## 2019-09-18 LAB — CBC WITH DIFFERENTIAL/PLATELET
Abs Immature Granulocytes: 0 10*3/uL (ref 0.00–0.07)
Basophils Absolute: 0 10*3/uL (ref 0.0–0.1)
Basophils Relative: 0 %
Eosinophils Absolute: 0.1 10*3/uL (ref 0.0–0.5)
Eosinophils Relative: 1 %
HCT: 34 % — ABNORMAL LOW (ref 36.0–46.0)
Hemoglobin: 11.4 g/dL — ABNORMAL LOW (ref 12.0–15.0)
Lymphocytes Relative: 5 %
Lymphs Abs: 0.3 10*3/uL — ABNORMAL LOW (ref 0.7–4.0)
MCH: 31.8 pg (ref 26.0–34.0)
MCHC: 33.5 g/dL (ref 30.0–36.0)
MCV: 94.7 fL (ref 80.0–100.0)
Monocytes Absolute: 0.1 10*3/uL (ref 0.1–1.0)
Monocytes Relative: 2 %
Neutro Abs: 5.2 10*3/uL (ref 1.7–7.7)
Neutrophils Relative %: 92 %
Platelets: 276 10*3/uL (ref 150–400)
RBC: 3.59 MIL/uL — ABNORMAL LOW (ref 3.87–5.11)
RDW: 12.4 % (ref 11.5–15.5)
WBC: 5.7 10*3/uL (ref 4.0–10.5)
nRBC: 0 % (ref 0.0–0.2)
nRBC: 0 /100 WBC

## 2019-09-18 LAB — GLUCOSE, CAPILLARY
Glucose-Capillary: 222 mg/dL — ABNORMAL HIGH (ref 70–99)
Glucose-Capillary: 172 mg/dL — ABNORMAL HIGH (ref 70–99)
Glucose-Capillary: 243 mg/dL — ABNORMAL HIGH (ref 70–99)
Glucose-Capillary: 297 mg/dL — ABNORMAL HIGH (ref 70–99)

## 2019-09-18 LAB — COMPREHENSIVE METABOLIC PANEL
ALT: 26 U/L (ref 0–44)
AST: 30 U/L (ref 15–41)
Albumin: 3 g/dL — ABNORMAL LOW (ref 3.5–5.0)
Alkaline Phosphatase: 56 U/L (ref 38–126)
Anion gap: 11 (ref 5–15)
BUN: 22 mg/dL (ref 8–23)
CO2: 21 mmol/L — ABNORMAL LOW (ref 22–32)
Calcium: 8.6 mg/dL — ABNORMAL LOW (ref 8.9–10.3)
Chloride: 106 mmol/L (ref 98–111)
Creatinine, Ser: 0.92 mg/dL (ref 0.44–1.00)
GFR calc Af Amer: >60 mL/min (ref >60)
GFR calc non Af Amer: >60 mL/min (ref >60)
Glucose, Bld: 258 mg/dL — ABNORMAL HIGH (ref 70–99)
Potassium: 4.2 mmol/L (ref 3.5–5.1)
Sodium: 138 mmol/L (ref 135–145)
Total Bilirubin: 0.6 mg/dL (ref 0.3–1.2)
Total Protein: 6.5 g/dL (ref 6.5–8.1)

## 2019-09-18 LAB — C-REACTIVE PROTEIN: CRP: 5.3 mg/dL — ABNORMAL HIGH (ref <1.0)

## 2019-09-18 LAB — PHOSPHORUS: Phosphorus: 3.6 mg/dL (ref 2.5–4.6)

## 2019-09-18 LAB — D-DIMER, QUANTITATIVE: D-Dimer, Quant: 1.24 ug/mL-FEU — ABNORMAL HIGH (ref 0.00–0.50)

## 2019-09-18 LAB — FERRITIN: Ferritin: 303 ng/mL (ref 11–307)

## 2019-09-18 LAB — MAGNESIUM: Magnesium: 1.7 mg/dL (ref 1.7–2.4)

## 2019-09-18 MED ORDER — ACETAMINOPHEN 325 MG PO TABS
650.0000 mg | ORAL_TABLET | Freq: Four times a day (QID) | ORAL | Status: DC | PRN
  Administered 2019-09-18 – 2019-09-20 (×3): 650 mg via ORAL
  Filled 2019-09-18 (×4): qty 2

## 2019-09-18 NOTE — Plan of Care (Signed)

## 2019-09-18 NOTE — Progress Notes (Signed)
   Vital Signs MEWS/VS Documentation      09/17/2019 0728 09/17/2019 1450 09/17/2019 1902 09/17/2019 1933   MEWS Score:  0  1  1  0   MEWS Score Color:  Green  Green  Green  Green   Resp:  --  (!) 25  --  19   Pulse:  --  73  --  71   BP:  --  (!) 146/68  --  137/74   Temp:  --  97.8 F (36.6 C)  --  98 F (36.7 C)   O2 Device:  --  Nasal Cannula  --  Nasal Cannula   O2 Flow Rate (L/min):  --  2 L/min  --  3 L/min   Level of Consciousness:  Alert  --  --  Vonzell Schlatter D Lainy Wrobleski 09/18/2019,12:20 AM

## 2019-09-18 NOTE — Progress Notes (Signed)
PROGRESS NOTE    [name]  [id number] DOB: May 23, 1951 DOA: 09/15/2019 PCP: Makayla Chandler, NP  Brief Narrative:  [name] is a 69 yr old woman with PMH of HTN, Dm2, CAD, PAF on anticoagulation, chronic back pain, TIA, breast cancer status post lumpectomy with radiation, and depression.  She presented with 1 week history of cough and lower back pain.  She reports having a productive cough that has caused her to have mid to lower back pain. O2 sats noted to be low in the 86% range on RA. COVID-19 positive with mildly elevated pct, on Rocephin and Azithromycin.     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   Chest x-ray showing bilateral pulmonary opacities concerning for infection versus edema. -Continue COVID-19 treatment plan with:  -Albuterol inhaler -Decadron 6 mg IV daily -Remdesivir -Empiric antibiotics of Rocephin and azithromycin due to mildly elevated procalcitonin -Antitussives as needed -Continue daily monitoring of inflammatory markers -Wean off O2 supplementation as tolerated.  -She is gradually improving but has increased dyspnea while ambulating.  -D-dimer elevated but she is on Eliquis. Other inflammatory markers trending down, continue monitoring.   Essential hypertension: Blood pressures initially elevated up to 175/83.  Home blood pressure medications include amlodipine 10 mg daily, Coreg 25 mg twice daily, losartan 100 mg daily -Continue Coreg and amlodipine -Holding losartan  Diabetes mellitus type 2  (controlled): Patient appears well controlled on oral medications of metformin.  Last hemoglobin A1c noted to be 6.4 in 04/2019 -Hypoglycemic protocol -Hold Metformin -Continue CBGs before every meal with sensitive SSI  Dehydration:  Patient presented with creatinine elevated at 1.37 with BUN 25. Has poor intake per mouth due to her acute illness.   Received IV fluids, Scr now back to baseline.   Paroxysmal atrial fibrillation on chronic anticoagulation: Patient on anticoagulation of Eliquis.  She appears to be in sinus rhythm at this time. -Continue Eliquis  Elevated AST: Acute.  On admission AST mildly elevated at 52.  Patient denies any history of alcohol abuse. -Continue to monitor while on remdesivir   Hyperlipidemia -Continue atorvastatin  Restless leg syndrome -Continue ropinirole  DVT prophylaxis: Eliquis Code Status: full  Family Communication: Discussed with the patient  Disposition Plan: DC home one clinically improved.  Consults called: None Procedures: None Antimicrobials:  Rocephin and Azithromycin   Subjective: Her appetite is improving. She felt short of breath while going to the bathroom this morning with O2 sats down to 76%, improved with rest and O2 supplementation of  3l/min.   Objective: Vitals:   09/18/19 0510 09/18/19 0842 09/18/19 1120 09/18/19 1502  BP: (!) 158/75 (!) 159/72  (!) 149/71  Pulse:  72  74  Resp:  (!) 29  18  Temp: 98.6 F (37 C)   98.1 F (36.7 C)  TempSrc:    Oral  SpO2:  96% (!) 76% 93%  Weight:      Height:        Intake/Output Summary (Last 24 hours) at 09/18/2019 1901 Last data filed at 09/18/2019 1153 Gross per 24 hour  Intake 816 ml  Output --  Net 816 ml   Filed Weights   09/15/19 1222 09/16/19 0200  Weight: 87.1 kg 79.8 kg    Examination:  General exam: Appears calm and comfortable  Respiratory system: Respiratory effort normal. No wheezing.  Cardiovascular system: S1 & S2 present, RRR.   Gastrointestinal system: Abdomen is nondistended, soft and nontender.  Central nervous system: Alert and oriented. No focal neurological deficits. Extremities: Symmetric 5 x 5 power. Skin: No rashes, lesions or ulcers Psychiatry: Mood & affect appropriate.     Data Reviewed: I have personally reviewed following labs and imaging studies  CBC: Recent Labs  Lab 09/15/19 1230 09/16/19 0450 09/17/19 0211 09/18/19 0645  WBC 5.0 3.4* 4.9 5.7  NEUTROABS  --  2.8 4.2 5.2  HGB 11.2* 10.9* 11.7* 11.4*  HCT 33.8* 31.6* 34.4* 34.0*  MCV 97.4 94.0 93.5 94.7  PLT 190 212 207 [id number]   Basic Metabolic Panel: Recent Labs  Lab 09/15/19 1230 09/16/19 0450 09/17/19 0211 09/18/19 0645  NA 134* 136 136 138  K 3.9 4.1 4.1 4.2  CL 102 103 104 106  CO2 22 20* 21* 21*  GLUCOSE 162* 250* 285* 258*  BUN 25* 19 22 22   CREATININE 1.37* 0.98 0.99 0.92  CALCIUM 8.9 8.5* 8.7* 8.6*  MG  --  1.7 1.8 1.7  PHOS  --  3.0 2.3* 3.6   GFR: Estimated Creatinine Clearance: 60.6 mL/min (by C-G formula based on SCr of 0.92 mg/dL). Liver Function Tests: Recent Labs  Lab 09/15/19 1450 09/16/19 0450 09/17/19 0211 09/18/19 0645  AST 52* 44* 41 30  ALT 31 30 29 26   ALKPHOS 64 55 61 56  BILITOT 0.7 0.4 0.6 0.6  PROT 7.3 6.5 6.6 6.5  ALBUMIN 3.5 3.0* 2.9* 3.0*   No results for input(s): LIPASE, AMYLASE in the last 168 hours. No results for input(s): AMMONIA in the last 168 hours. Coagulation Profile: No results for input(s): INR, PROTIME in the last 168 hours. Cardiac Enzymes: No results for input(s): CKTOTAL, CKMB, CKMBINDEX, TROPONINI in the last 168 hours. BNP (last 3 results) No results for input(s): PROBNP in the last 8760 hours. HbA1C: Recent Labs    09/16/19 0503  HGBA1C 7.0*   CBG: Recent Labs  Lab 09/17/19 1617 09/17/19 2056 09/18/19 0758 09/18/19 1152 09/18/19 1648  GLUCAP 200* 200* 222* 172* 243*   Lipid Profile: No results for input(s): CHOL, HDL, LDLCALC, TRIG, CHOLHDL,  LDLDIRECT in the last 72 hours. Thyroid Function Tests: No results for input(s): TSH, T4TOTAL, FREET4, T3FREE, THYROIDAB in the last 72 hours. Anemia Panel: Recent Labs    09/17/19 0211 09/18/19 0645  FERRITIN 426* 303   Sepsis Labs: Recent Labs  Lab 09/15/19 1450 09/15/19 1610  PROCALCITON 0.36  --   LATICACIDVEN 1.2 0.8    Recent Results (from the past 240 hour(s))  Blood Culture (routine x 2)     Status: None (Preliminary result)   Collection Time: 09/15/19  2:50 PM   Specimen: BLOOD  Result Value Ref Range Status   Specimen Description BLOOD LEFT ANTECUBITAL  Final   Special Requests   Final    BOTTLES DRAWN AEROBIC AND ANAEROBIC Blood Culture results may not be optimal due to an inadequate volume of blood received in culture bottles   Culture   Final    NO GROWTH 3 DAYS Performed at [facility], [address] [place], [place] [address]    Report Status PENDING  Incomplete  Blood Culture (routine x  2)     Status: None (Preliminary result)   Collection Time: 09/15/19  2:51 PM   Specimen: BLOOD  Result Value Ref Range Status   Specimen Description BLOOD BLOOD RIGHT FOREARM  Final   Special Requests   Final    BOTTLES DRAWN AEROBIC AND ANAEROBIC Blood Culture adequate volume   Culture   Final    NO GROWTH 3 DAYS Performed at [facility], [address], [place], [place] [address]    Report Status PENDING  Incomplete         Radiology Studies: No results found.      Scheduled Meds: . albuterol  2 puff Inhalation Q6H  . amLODipine  5 mg Oral Daily  . apixaban  5 mg Oral BID  . vitamin C  500 mg Oral Daily  . atorvastatin  40 mg Oral q1800  . carvedilol  25 mg Oral BID WC  . dexamethasone (DECADRON) injection  6 mg Intravenous Q24H  . insulin aspart  0-5 Units Subcutaneous QHS  . insulin aspart  0-9 Units Subcutaneous TID WC  . mouth rinse  15 mL Mouth Rinse BID  . rOPINIRole  0.25 mg Oral QHS  . sodium chloride flush  3 mL  Intravenous Q12H  . zinc sulfate  220 mg Oral Daily   Continuous Infusions: . azithromycin 500 mg (09/18/19 1840)  . cefTRIAXone (ROCEPHIN)  IV 2 g (09/18/19 1758)  . remdesivir 100 mg in NS 100 mL 100 mg (09/18/19 1713)     LOS: 3 days    Time spent: 35 minutes    Blain Pais, MD [facility]   If 7PM-7AM, please contact night-coverage [email or web address] Password [email or web address] 09/18/2019, 7:01 PM

## 2019-09-19 LAB — PATHOLOGIST SMEAR REVIEW

## 2019-09-19 LAB — CBC WITH DIFFERENTIAL/PLATELET
Abs Immature Granulocytes: 0.05 10*3/uL (ref 0.00–0.07)
Basophils Absolute: 0 10*3/uL (ref 0.0–0.1)
Basophils Relative: 0 %
Eosinophils Absolute: 0 10*3/uL (ref 0.0–0.5)
Eosinophils Relative: 0 %
HCT: 33.7 % — ABNORMAL LOW (ref 36.0–46.0)
Hemoglobin: 11.5 g/dL — ABNORMAL LOW (ref 12.0–15.0)
Immature Granulocytes: 1 %
Lymphocytes Relative: 13 %
Lymphs Abs: 0.7 10*3/uL (ref 0.7–4.0)
MCH: 32.1 pg (ref 26.0–34.0)
MCHC: 34.1 g/dL (ref 30.0–36.0)
MCV: 94.1 fL (ref 80.0–100.0)
Monocytes Absolute: 0.2 10*3/uL (ref 0.1–1.0)
Monocytes Relative: 4 %
Neutro Abs: 4.1 10*3/uL (ref 1.7–7.7)
Neutrophils Relative %: 82 %
Platelets: 277 10*3/uL (ref 150–400)
RBC: 3.58 MIL/uL — ABNORMAL LOW (ref 3.87–5.11)
RDW: 12.2 % (ref 11.5–15.5)
WBC: 5 10*3/uL (ref 4.0–10.5)
nRBC: 0 % (ref 0.0–0.2)

## 2019-09-19 LAB — GLUCOSE, CAPILLARY
Glucose-Capillary: 244 mg/dL — ABNORMAL HIGH (ref 70–99)
Glucose-Capillary: 184 mg/dL — ABNORMAL HIGH (ref 70–99)
Glucose-Capillary: 209 mg/dL — ABNORMAL HIGH (ref 70–99)
Glucose-Capillary: 210 mg/dL — ABNORMAL HIGH (ref 70–99)

## 2019-09-19 LAB — D-DIMER, QUANTITATIVE: D-Dimer, Quant: 1.74 ug/mL-FEU — ABNORMAL HIGH (ref 0.00–0.50)

## 2019-09-19 LAB — MAGNESIUM: Magnesium: 1.7 mg/dL (ref 1.7–2.4)

## 2019-09-19 LAB — COMPREHENSIVE METABOLIC PANEL
ALT: 26 U/L (ref 0–44)
AST: 30 U/L (ref 15–41)
Albumin: 2.8 g/dL — ABNORMAL LOW (ref 3.5–5.0)
Alkaline Phosphatase: 57 U/L (ref 38–126)
Anion gap: 10 (ref 5–15)
BUN: 23 mg/dL (ref 8–23)
CO2: 20 mmol/L — ABNORMAL LOW (ref 22–32)
Calcium: 8.5 mg/dL — ABNORMAL LOW (ref 8.9–10.3)
Chloride: 106 mmol/L (ref 98–111)
Creatinine, Ser: 0.98 mg/dL (ref 0.44–1.00)
GFR calc Af Amer: >60 mL/min (ref >60)
GFR calc non Af Amer: 58 mL/min — ABNORMAL LOW (ref >60)
Glucose, Bld: 291 mg/dL — ABNORMAL HIGH (ref 70–99)
Potassium: 4.5 mmol/L (ref 3.5–5.1)
Sodium: 136 mmol/L (ref 135–145)
Total Bilirubin: 0.7 mg/dL (ref 0.3–1.2)
Total Protein: 6.3 g/dL — ABNORMAL LOW (ref 6.5–8.1)

## 2019-09-19 LAB — C-REACTIVE PROTEIN: CRP: 3 mg/dL — ABNORMAL HIGH (ref <1.0)

## 2019-09-19 LAB — FERRITIN: Ferritin: 280 ng/mL (ref 11–307)

## 2019-09-19 LAB — PHOSPHORUS: Phosphorus: 2.7 mg/dL (ref 2.5–4.6)

## 2019-09-19 MED ORDER — INSULIN GLARGINE 100 UNIT/ML ~~LOC~~ SOLN
10.0000 [IU] | Freq: Every day | SUBCUTANEOUS | Status: DC
  Administered 2019-09-19 – 2019-09-20 (×2): 10 [IU] via SUBCUTANEOUS
  Filled 2019-09-19 (×3): qty 0.1

## 2019-09-19 NOTE — Plan of Care (Signed)
  Problem: Clinical Measurements: Goal: Ability to maintain clinical measurements within normal limits will improve Outcome: Not Progressing Note: Patient had episode of dyspnea in previous 6 hours, required additional supplemental oxygen.  Patient switched to hiflo nasal cannula. Goal: Will remain free from infection Outcome: Not Progressing Note: Patient has current pneumonia infection that is being treated with antibiotics. Goal: Respiratory complications will improve Outcome: Not Progressing Note: SLow progress being made towards goals; patient instructed and reinforced to use incentive spirometer. Goal: Cardiovascular complication will be avoided Outcome: Progressing   Problem: Skin Integrity: Goal: Risk for impaired skin integrity will decrease Outcome: Progressing

## 2019-09-19 NOTE — Progress Notes (Signed)
Inpatient Diabetes Program Recommendations  AACE/ADA: New Consensus Statement on Inpatient Glycemic Control   Target Ranges:  Prepandial:   less than 140 mg/dL      Peak postprandial:   less than 180 mg/dL (1-2 hours)      Critically ill patients:  140 - 180 mg/dL   Results for [name], [name] (MRN [id number]) as of 09/19/2019 11:26  Ref. Range 09/18/2019 07:58 09/18/2019 11:52 09/18/2019 16:48 09/18/2019 21:18 09/19/2019 07:33  Glucose-Capillary Latest Ref Range: 70 - 99 mg/dL 222 (H) 172 (H) 243 (H) 297 (H) 244 (H)   Review of Glycemic Control  Diabetes history: DM2 Outpatient Diabetes medications: Metformin 1000 mg BID Current orders for Inpatient glycemic control: Novolog 0-9 units TID with meals, Novolog 0-5 units QHS; Decadron 6 mg Q24H  Inpatient Diabetes Program Recommendations:   Insulin - Basal: If steroids are continued, please consider ordering Lantus 10 units Q24H starting now.  HgbA1C: A1C 7% on 09/16/19 indicating an average glucose of 154 mg/dl over the past 2-3 months.  Thanks, Barnie Alderman, RN, MSN, CDE Diabetes Coordinator Inpatient Diabetes Program [phone] (Team Pager from 8am to 5pm)

## 2019-09-19 NOTE — Progress Notes (Signed)
PROGRESS NOTE    [name]  [id number] DOB: Oct 05, 1950 DOA: 09/15/2019 PCP: Makayla Chandler, NP  Brief Narrative:  [name] is a 69 yr old woman with PMH of HTN, Dm2, CAD, PAF on anticoagulation, chronic back pain, TIA, breast cancer status post lumpectomy with radiation, and depression.  She presented with 1 week history of cough and lower back pain.  She reports having a productive cough that has caused her to have mid to lower back pain. O2 sats noted to be low in the 86% range on RA. COVID-19 positive with mildly elevated pct, on Rocephin and Azithromycin.    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   Chest x-ray showing bilateral pulmonary opacities concerning for infection versus edema. -Continue COVID-19 treatment plan with:  -Albuterol inhaler -Decadron 6 mg IV daily -Remdesivir -Empiric antibiotics of Rocephin and azithromycin x 5 days due to mildly elevated procalcitonin -Antitussives as needed -Continue daily monitoring of inflammatory markers -Wean off O2 supplementation as tolerated.  -She is gradually improving but has increased dyspnea while ambulating.  -D-dimer elevated but she is on Eliquis. Other inflammatory markers trending down, continue monitoring.   Essential hypertension: Blood pressures initially elevated up to 175/83.  Home blood pressure medications include amlodipine 10 mg daily, Coreg 25 mg twice daily, losartan 100 mg daily -Continue Coreg and amlodipine -Holding losartan  Diabetes mellitus type  2 (controlled): Patient appears well controlled on oral medications of metformin.  Last hemoglobin A1c noted to be 6.4 in 04/2019. Has hyperglycemia due to steroid tx while inpatient.  -Hypoglycemic protocol -Hold Metformin -Continue CBGs before every meal with sensitive SSI.  -Started Lantus 10 units daily today due to elevated blood sugars.   Dehydration:  Patient presented with creatinine elevated at 1.37 with BUN 25. Has poor intake per mouth due to her acute illness.   Received IV fluids, Scr now back to baseline.   Paroxysmal atrial fibrillation on chronic anticoagulation: Patient on anticoagulation of Eliquis.  She appears to be in sinus rhythm at this time. -Continue Eliquis  Elevated AST: Acute.  On admission AST mildly elevated at 52.  Patient denies any history of alcohol abuse. -Continue to monitor while on remdesivir   Hyperlipidemia -Continue atorvastatin  Restless leg syndrome -Continue ropinirole  DVT prophylaxis: Eliquis Code Status: full  Family Communication: Discussed with the patient  Disposition Plan: DC home one clinically improved. PT ordered.  Consults called: None Procedures: None Antimicrobials:  Rocephin and Azithromycin   Subjective: Continues to improve but has desats when ambulating. Has been using IS today.   Objective: Vitals:   09/19/19 0835 09/19/19 0935 09/19/19 1035 09/19/19 1430  BP: (!) 162/85  (!) 156/98 123/67  Pulse:    74  Resp:    (!) 25  Temp:    97.8 F (36.6 C)  TempSrc:    Oral  SpO2:  93%  92%  Weight:      Height:        Intake/Output Summary (Last 24 hours) at 09/19/2019 1720 Last data filed at 09/19/2019 1000 Gross per 24 hour  Intake 203 ml  Output --  Net 203 ml   Filed Weights   09/15/19 1222 09/16/19 0200 09/19/19 0604  Weight: 87.1 kg 79.8 kg 82.4 kg    Examination:  General exam: Appears calm and comfortable  Respiratory system: Respiratory effort normal. No wheezing.  Cardiovascular system: S1  & S2 present, RRR.  Gastrointestinal system: Abdomen is nondistended, soft and nontender.  Central nervous system: Alert and oriented. No focal neurological deficits. Extremities: Symmetric 5 x 5 power. Skin: No rashes, lesions or ulcers Psychiatry: Mood & affect appropriate.     Data Reviewed: I have personally reviewed following labs and imaging studies  CBC: Recent Labs  Lab 09/15/19 1230 09/16/19 0450 09/17/19 0211 09/18/19 0645 09/19/19 0420  WBC 5.0 3.4* 4.9 5.7 5.0  NEUTROABS  --  2.8 4.2 5.2 4.1  HGB 11.2* 10.9* 11.7* 11.4* 11.5*  HCT 33.8* 31.6* 34.4* 34.0* 33.7*  MCV 97.4 94.0 93.5 94.7 94.1  PLT 190 212 207 276 [id number]   Basic Metabolic Panel: Recent Labs  Lab 09/15/19 1230 09/16/19 0450 09/17/19 0211 09/18/19 0645 09/19/19 0420  NA 134* 136 136 138 136  K 3.9 4.1 4.1 4.2 4.5  CL 102 103 104 106 106  CO2 22 20* 21* 21* 20*  GLUCOSE 162* 250* 285* 258* 291*  BUN 25* 19 22 22 23   CREATININE 1.37* 0.98 0.99 0.92 0.98  CALCIUM 8.9 8.5* 8.7* 8.6* 8.5*  MG  --  1.7 1.8 1.7 1.7  PHOS  --  3.0 2.3* 3.6 2.7   GFR: Estimated Creatinine Clearance: 57.8 mL/min (by C-G formula based on SCr of 0.98 mg/dL). Liver Function Tests: Recent Labs  Lab 09/15/19 1450 09/16/19 0450 09/17/19 0211 09/18/19 0645 09/19/19 0420  AST 52* 44* 41 30 30  ALT 31 30 29 26 26   ALKPHOS 64 55 61 56 57  BILITOT 0.7 0.4 0.6 0.6 0.7  PROT 7.3 6.5 6.6 6.5 6.3*  ALBUMIN 3.5 3.0* 2.9* 3.0* 2.8*   No results for input(s): LIPASE, AMYLASE in the last 168 hours. No results for input(s): AMMONIA in the last 168 hours. Coagulation Profile: No results for input(s): INR, PROTIME in the last 168 hours. Cardiac Enzymes: No results for input(s): CKTOTAL, CKMB, CKMBINDEX, TROPONINI in the last 168 hours. BNP (last 3 results) No results for input(s): PROBNP in the last 8760 hours. HbA1C: No results for input(s): HGBA1C in the last 72 hours. CBG: Recent Labs  Lab 09/18/19 1648 09/18/19 2118  09/19/19 0733 09/19/19 1142 09/19/19 1657  GLUCAP 243* 297* 244* 184* 209*   Lipid Profile: No results for input(s): CHOL, HDL, LDLCALC, TRIG, CHOLHDL, LDLDIRECT in the last 72 hours. Thyroid Function Tests: No results for input(s): TSH, T4TOTAL, FREET4, T3FREE, THYROIDAB in the last 72 hours. Anemia Panel: Recent Labs    09/18/19 0645 09/19/19 0420  FERRITIN 303 280   Sepsis Labs: Recent Labs  Lab 09/15/19 1450 09/15/19 1610  PROCALCITON 0.36  --   LATICACIDVEN 1.2 0.8    Recent Results (from the past 240 hour(s))  Blood Culture (routine x 2)     Status: None (Preliminary result)   Collection Time: 09/15/19  2:50 PM   Specimen: BLOOD  Result Value Ref Range Status   Specimen Description BLOOD LEFT ANTECUBITAL  Final   Special Requests   Final    BOTTLES DRAWN AEROBIC AND ANAEROBIC Blood Culture results may not be optimal due to an inadequate volume of blood received in culture bottles   Culture  Final    NO GROWTH 4 DAYS Performed at [facility], [place] [address], [place], [place] [address]    Report Status PENDING  Incomplete  Blood Culture (routine x 2)     Status: None (Preliminary result)   Collection Time: 09/15/19  2:51 PM   Specimen: BLOOD  Result Value Ref Range Status   Specimen Description BLOOD BLOOD RIGHT FOREARM  Final   Special Requests   Final    BOTTLES DRAWN AEROBIC AND ANAEROBIC Blood Culture adequate volume   Culture   Final    NO GROWTH 4 DAYS Performed at [facility], [place] [address] [place], [place] [address]    Report Status PENDING  Incomplete         Radiology Studies: No results found.      Scheduled Meds: . albuterol  2 puff Inhalation Q6H  . amLODipine  5 mg Oral Daily  . apixaban  5 mg Oral BID  . vitamin C  500 mg Oral Daily  . atorvastatin  40 mg Oral q1800  . carvedilol  25 mg Oral BID WC  . dexamethasone (DECADRON) injection  6 mg Intravenous Q24H  . insulin aspart  0-5 Units Subcutaneous QHS  .  insulin aspart  0-9 Units Subcutaneous TID WC  . insulin glargine  10 Units Subcutaneous Daily  . mouth rinse  15 mL Mouth Rinse BID  . rOPINIRole  0.25 mg Oral QHS  . sodium chloride flush  3 mL Intravenous Q12H  . zinc sulfate  220 mg Oral Daily   Continuous Infusions: . azithromycin 500 mg (09/18/19 1840)  . cefTRIAXone (ROCEPHIN)  IV 2 g (09/18/19 1758)  . remdesivir 100 mg in NS 100 mL 100 mg (09/18/19 1713)     LOS: 4 days    Time spent: 35 minutes    Blain Pais, MD [facility]   If 7PM-7AM, please contact night-coverage [email or web address] Password [email or web address] 09/19/2019, 5:20 PM

## 2019-09-20 LAB — CBC WITH DIFFERENTIAL/PLATELET
Abs Immature Granulocytes: 0 10*3/uL (ref 0.00–0.07)
Basophils Absolute: 0 10*3/uL (ref 0.0–0.1)
Basophils Relative: 0 %
Eosinophils Absolute: 0 10*3/uL (ref 0.0–0.5)
Eosinophils Relative: 0 %
HCT: 33.3 % — ABNORMAL LOW (ref 36.0–46.0)
Hemoglobin: 11.5 g/dL — ABNORMAL LOW (ref 12.0–15.0)
Lymphocytes Relative: 6 %
Lymphs Abs: 0.4 10*3/uL — ABNORMAL LOW (ref 0.7–4.0)
MCH: 32 pg (ref 26.0–34.0)
MCHC: 34.5 g/dL (ref 30.0–36.0)
MCV: 92.8 fL (ref 80.0–100.0)
Monocytes Absolute: 0.2 10*3/uL (ref 0.1–1.0)
Monocytes Relative: 3 %
Neutro Abs: 5.5 10*3/uL (ref 1.7–7.7)
Neutrophils Relative %: 91 %
Platelets: 318 10*3/uL (ref 150–400)
RBC: 3.59 MIL/uL — ABNORMAL LOW (ref 3.87–5.11)
RDW: 12 % (ref 11.5–15.5)
WBC: 6 10*3/uL (ref 4.0–10.5)
nRBC: 0 % (ref 0.0–0.2)
nRBC: 0 /100 WBC

## 2019-09-20 LAB — COMPREHENSIVE METABOLIC PANEL
ALT: 24 U/L (ref 0–44)
AST: 23 U/L (ref 15–41)
Albumin: 2.8 g/dL — ABNORMAL LOW (ref 3.5–5.0)
Alkaline Phosphatase: 60 U/L (ref 38–126)
Anion gap: 10 (ref 5–15)
BUN: 27 mg/dL — ABNORMAL HIGH (ref 8–23)
CO2: 23 mmol/L (ref 22–32)
Calcium: 8.9 mg/dL (ref 8.9–10.3)
Chloride: 103 mmol/L (ref 98–111)
Creatinine, Ser: 0.96 mg/dL (ref 0.44–1.00)
GFR calc Af Amer: >60 mL/min (ref >60)
GFR calc non Af Amer: 60 mL/min — ABNORMAL LOW (ref >60)
Glucose, Bld: 298 mg/dL — ABNORMAL HIGH (ref 70–99)
Potassium: 4.5 mmol/L (ref 3.5–5.1)
Sodium: 136 mmol/L (ref 135–145)
Total Bilirubin: 0.5 mg/dL (ref 0.3–1.2)
Total Protein: 6.4 g/dL — ABNORMAL LOW (ref 6.5–8.1)

## 2019-09-20 LAB — FERRITIN: Ferritin: 226 ng/mL (ref 11–307)

## 2019-09-20 LAB — MAGNESIUM: Magnesium: 1.6 mg/dL — ABNORMAL LOW (ref 1.7–2.4)

## 2019-09-20 LAB — PHOSPHORUS: Phosphorus: 2.9 mg/dL (ref 2.5–4.6)

## 2019-09-20 LAB — CULTURE, BLOOD (ROUTINE X 2)
Culture: NO GROWTH
Culture: NO GROWTH
Special Requests: ADEQUATE

## 2019-09-20 LAB — GLUCOSE, CAPILLARY
Glucose-Capillary: 252 mg/dL — ABNORMAL HIGH (ref 70–99)
Glucose-Capillary: 215 mg/dL — ABNORMAL HIGH (ref 70–99)
Glucose-Capillary: 238 mg/dL — ABNORMAL HIGH (ref 70–99)
Glucose-Capillary: 305 mg/dL — ABNORMAL HIGH (ref 70–99)

## 2019-09-20 LAB — D-DIMER, QUANTITATIVE: D-Dimer, Quant: 2.41 ug/mL-FEU — ABNORMAL HIGH (ref 0.00–0.50)

## 2019-09-20 LAB — C-REACTIVE PROTEIN: CRP: 2.1 mg/dL — ABNORMAL HIGH (ref <1.0)

## 2019-09-20 MED ORDER — MAGNESIUM SULFATE 2 GM/50ML IV SOLN
2.0000 g | Freq: Once | INTRAVENOUS | Status: AC
  Administered 2019-09-20: 2 g via INTRAVENOUS
  Filled 2019-09-20: qty 50

## 2019-09-20 MED ORDER — INSULIN ASPART 100 UNIT/ML ~~LOC~~ SOLN
3.0000 [IU] | Freq: Three times a day (TID) | SUBCUTANEOUS | Status: DC
  Administered 2019-09-20 – 2019-09-21 (×2): 3 [IU] via SUBCUTANEOUS

## 2019-09-20 MED ORDER — AMLODIPINE BESYLATE 10 MG PO TABS
10.0000 mg | ORAL_TABLET | Freq: Every day | ORAL | Status: DC
  Administered 2019-09-21 – 2019-09-23 (×3): 10 mg via ORAL
  Filled 2019-09-20 (×3): qty 1

## 2019-09-20 NOTE — Progress Notes (Signed)
Inpatient Diabetes Program Recommendations  AACE/ADA: New Consensus Statement on Inpatient Glycemic Control   Target Ranges:  Prepandial:   less than 140 mg/dL      Peak postprandial:   less than 180 mg/dL (1-2 hours)      Critically ill patients:  140 - 180 mg/dL   Results for [name], [name] (MRN [id number]) as of 09/20/2019 13:36  Ref. Range 09/19/2019 07:33 09/19/2019 11:42 09/19/2019 16:57 09/19/2019 20:42 09/20/2019 07:58 09/20/2019 11:52  Glucose-Capillary Latest Ref Range: 70 - 99 mg/dL 244 (H) 184 (H) 209 (H) 210 (H) 252 (H) 215 (H)   Review of Glycemic Control  Diabetes history: DM2 Outpatient Diabetes medications: Metformin 1000 mg BID Current orders for Inpatient glycemic control: Lantus 10 units daily, Novolog 0-9 units TID with meals, Novolog 0-5 units QHS; Decadron 6 mg Q24H  Inpatient Diabetes Program Recommendations:   Insulin - Basal: If steroids are continued, please consider increasing Lantus to 13 units daily.  Insulin-Meal Coverage: If steroids are continued, please consider ordering Novolog 3 units TID with meals for meal coverage if patient eats at least 50% of meals.  Thanks, Barnie Alderman, RN, MSN, CDE Diabetes Coordinator Inpatient Diabetes Program [phone] (Team Pager from 8am to 5pm)

## 2019-09-20 NOTE — Progress Notes (Signed)
PROGRESS NOTE   [name]  [id number]    DOB: [birth year]    DOA: 09/15/2019  PCP: Lauree Chandler, NP   I have briefly reviewed patients previous medical records in [facility].  Chief Complaint:   Chief Complaint  Patient presents with  . Back Pain    Brief Narrative:  69 year old female with PMH of hypertension, type II DM, CAD, PAF on anticoagulation,?  Chronic back pain, TIA, breast cancer s/p lumpectomy with radiation and depression presented with 1 week history of productive cough leading to worsening of mid to low back pain, noted to be hypoxic at 86% on room air and COVID-19 positive.   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  Ongoing treatment with IV Decadron, remdesivir and also on empiric IV ceftriaxone and azithromycin due to mildly elevated procalcitonin and productive cough on admission.  Continues to gradually improve.  Was on HFNC 10 L/min oxygen overnight, now down to 3 L/min.  Essential hypertension  Mildly uncontrolled at times.  Continue amlodipine and carvedilol.  Consider increasing amlodipine dose from 5 to 10 mg daily and adding as needed hydralazine.  Holding losartan.  Type II DM with hyperglycemia  A1c 6.4 in August 2020.  Hyperglycemia precipitated by steroids.  Holding Metformin.  Currently on Lantus 10 units daily.  DM coordinator input appreciated.  Will add mealtime NovoLog 3 units and consider increasing Lantus to 13 units daily from  tomorrow.  Dehydration  She presented with creatinine of 1.37 and BUN of 25.  Likely due to poor oral intake.  S/p IV fluids.  Creatinine normalized.  Continue to hold ARB.  Paroxysmal atrial fibrillation  Currently in sinus rhythm.  Continue carvedilol and Eliquis anticoagulation.  Abnormal LFTs  Likely due to COVID-19 infection.  Resolved.  Hyperlipidemia  Continue atorvastatin.  Restless leg syndrome  Continue ropinirole.  Low back pain  Denies chronic back pain.  Reportedly has had it for a couple of weeks now.  Possibly related to cough.  Indicates improvement on Tylenol and tramadol.  Mobilize out of bed as much as possible.  Hypomagnesemia  Replace and follow  Anemia  Possibly anemia of chronic disease.  Stable.  Body mass index is 29.32 kg/m.   DVT prophylaxis: Eliquis Code Status: Full Family Communication: None at bedside Disposition: DC home pending clinical improvement.   Consultants:   None  Procedures:   None  Antimicrobials:   Ceftriaxone and azithromycin   Subjective:  Overall continues to gradually improve.  Low back pain controlled with Tylenol and tramadol.  Dyspnea improved but breathing not yet at baseline.  Some dyspnea on exertion.  Cough with mild production.  No chest pain  Objective:   Vitals:   09/19/19 2251 09/20/19 0454 09/20/19 0803 09/20/19 1207  BP:  (!) 169/71 (!) 157/79 134/67  Pulse: 77   64  Resp: (!) 21   15  Temp:  98.7 F (37.1 C)  97.6 F (36.4 C)  TempSrc:  Oral  Oral  SpO2: 95%   95%  Weight:  Height:        General exam: Pleasant elderly female, moderately built and nourished lying comfortably propped up in bed without distress.  Oral mucosa with borderline hydration. Respiratory system: Slightly diminished breath sounds in the bases but otherwise clear to auscultation.  No wheezing, rhonchi or crackles.  No increased work of breathing. Cardiovascular system: S1 & S2 heard, RRR. No JVD, murmurs,  rubs, gallops or clicks. No pedal edema.  Telemetry personally reviewed: SB in the high 50s-SR. Gastrointestinal system: Abdomen is nondistended, soft and nontender. No organomegaly or masses felt. Normal bowel sounds heard. Central nervous system: Alert and oriented. No focal neurological deficits. Extremities: Symmetric 5 x 5 power. Skin: No rashes, lesions or ulcers Psychiatry: Judgement and insight appear normal. Mood & affect appropriate.     Data Reviewed:   I have personally reviewed following labs and imaging studies   CBC: Recent Labs  Lab 09/18/19 0645 09/19/19 0420 09/20/19 0349  WBC 5.7 5.0 6.0  NEUTROABS 5.2 4.1 5.5  HGB 11.4* 11.5* 11.5*  HCT 34.0* 33.7* 33.3*  MCV 94.7 94.1 92.8  PLT 276 277 0000000    Basic Metabolic Panel: Recent Labs  Lab 09/18/19 0645 09/19/19 0420 09/20/19 0349  NA 138 136 136  K 4.2 4.5 4.5  CL 106 106 103  CO2 21* 20* 23  GLUCOSE 258* 291* 298*  BUN 22 23 27*  CREATININE 0.92 0.98 0.96  CALCIUM 8.6* 8.5* 8.9  MG 1.7 1.7 1.6*  PHOS 3.6 2.7 2.9    Liver Function Tests: Recent Labs  Lab 09/18/19 0645 09/19/19 0420 09/20/19 0349  AST 30 30 23   ALT 26 26 24   ALKPHOS 56 57 60  BILITOT 0.6 0.7 0.5  PROT 6.5 6.3* 6.4*  ALBUMIN 3.0* 2.8* 2.8*    CBG: Recent Labs  Lab 09/20/19 0758 09/20/19 1152 09/20/19 1613  GLUCAP 252* 215* 238*    Microbiology Studies:   Recent Results (from the past 240 hour(s))  Blood Culture (routine x 2)     Status: None   Collection Time: 09/15/19  2:50 PM   Specimen: BLOOD  Result Value Ref Range Status   Specimen Description BLOOD LEFT ANTECUBITAL  Final   Special Requests   Final    BOTTLES DRAWN AEROBIC AND ANAEROBIC Blood Culture results may not be optimal due to an inadequate volume of blood received in culture bottles   Culture   Final    NO GROWTH 5 DAYS Performed at [facility], [place] [address], [place], [place] [address]    Report Status 09/20/2019 FINAL  Final  Blood  Culture (routine x 2)     Status: None   Collection Time: 09/15/19  2:51 PM   Specimen: BLOOD  Result Value Ref Range Status   Specimen Description BLOOD BLOOD RIGHT FOREARM  Final   Special Requests   Final    BOTTLES DRAWN AEROBIC AND ANAEROBIC Blood Culture adequate volume   Culture   Final    NO GROWTH 5 DAYS Performed at [facility], [place] [address], [place], [place] [address]    Report Status 09/20/2019 FINAL  Final     Radiology Studies:  No results found.   Scheduled Meds:   . albuterol  2 puff Inhalation Q6H  . amLODipine  5 mg Oral Daily  . apixaban  5 mg Oral BID  . vitamin C  500 mg Oral Daily  . atorvastatin  40 mg Oral q1800  . carvedilol  25 mg Oral BID WC  . dexamethasone (DECADRON) injection  6 mg Intravenous Q24H  . insulin aspart  0-5 Units Subcutaneous QHS  . insulin aspart  0-9 Units Subcutaneous TID WC  . insulin glargine  10 Units Subcutaneous Daily  . mouth rinse  15 mL Mouth Rinse BID  . rOPINIRole  0.25 mg Oral QHS  . sodium chloride flush  3 mL Intravenous Q12H  . zinc sulfate  220 mg Oral Daily    Continuous Infusions:     LOS: 5 days     Vernell Leep, MD, [employer], [facility]. [facility]    To contact the attending provider between 7A-7P or the covering provider during after hours 7P-7A, please log into the web site [email or web address] and access using universal [place] password for that web site. If you do not have the password, please call the hospital operator.  09/20/2019, 4:32 PM

## 2019-09-20 NOTE — Evaluation (Signed)
Physical Therapy Evaluation Patient Details Name: [name] MRN: [id number] DOB: 1951/06/18 Today's Date: 09/20/2019   History of Present Illness  69 yr old woman with PMH of HTN, Dm2, CAD, PAF on anticoagulation, chronic back pain, TIA, breast cancer status post lumpectomy with radiation, and depression. Presented to ED with O2 sats in the 86% range on RA and found to be COVID+. Admitted 09/15/19 for treatement of Acute respiratory failure with hypoxia secondary to COVID PNA  Clinical Impression  PTA pt living with daughter and granddaughter in single story home with 2 steps to enter. Pt was completely independent working the night shift at [employer]. Pt is currently limited in safe mobility by oxygen desaturation (see General Comments) as well as decreased endurance. Pt is mod I for bed mobility, and transfers and supervision for ambulation of 80 feet without AD. Pt requires 2x standing rest breaks to recover SaO2 on 6L HFNC. Given PLOF and home support PT does not anticipate any further PT needs at d/c, however PT will continue to follow acutely to work on energy conservation and endurance.     Follow Up Recommendations No PT follow up;Supervision for mobility/OOB    Equipment Recommendations  None recommended by PT    Recommendations for Other Services OT consult(for energy conservation)     Precautions / Restrictions Precautions Precautions: None Restrictions Weight Bearing Restrictions: No      Mobility  Bed Mobility Overal bed mobility: Modified Independent             General bed mobility comments: HoB elevated and use fo bed rail to pull to EoB  Transfers Overall transfer level: Modified independent Equipment used: None             General transfer comment: increased effort and use of hand rail from low toilet   Ambulation/Gait Ambulation/Gait assistance: Supervision Gait Distance (Feet): 80 Feet Assistive device: None Gait Pattern/deviations:  Step-through pattern;WFL(Within Functional Limits) Gait velocity: slowed Gait velocity interpretation: 1.31 - 2.62 ft/sec, indicative of limited community ambulator General Gait Details: slow, mildly unsteady gait, requiring 2x standing rest breaks to recover SaO2        Balance Overall balance assessment: Mild deficits observed, not formally tested                                           Pertinent Vitals/Pain Pain Assessment: 0-10 Pain Score: 1  Pain Location: back  Pain Descriptors / Indicators: Aching;Sore Pain Intervention(s): Limited activity within patient's tolerance;Monitored during session;Repositioned    Home Living Family/patient expects to be discharged to:: Private residence Living Arrangements: Children Available Help at Discharge: Family;Available 24 hours/day Type of Home: House Home Access: Stairs to enter   [employer] of Steps: 2 Home Layout: One level Home Equipment: Walker - 2 wheels;Cane - single point      Prior Function Level of Independence: Independent         Comments: Works at [employer]         Extremity/Trunk Assessment   Upper Extremity Assessment Upper Extremity Assessment: Overall WFL for tasks assessed    Lower Extremity Assessment Lower Extremity Assessment: Overall WFL for tasks assessed       Communication   Communication: No difficulties  Cognition Arousal/Alertness: Awake/alert Behavior During Therapy: WFL for tasks assessed/performed Overall Cognitive Status: Within Functional Limits for tasks assessed  General Comments General comments (skin integrity, edema, etc.): Pt on 5 L O2 via HFNC with SaO2 at rest 93%O2 with ambulation on 6L HFNC SaO2 dropped to 86%O2 requiring standing rest break and vc for pursed lipped breathing for O2 recovery to 91%O2     Assessment/Plan    PT Assessment Patient needs continued PT services   PT Problem List Decreased strength;Decreased mobility;Cardiopulmonary status limiting activity       PT Treatment Interventions DME instruction;Gait training;Functional mobility training;Stair training;Therapeutic activities;Therapeutic exercise;Balance training;Cognitive remediation;Patient/family education    PT Goals (Current goals can be found in the Care Plan section)  Acute Rehab PT Goals Patient Stated Goal: feel better PT Goal Formulation: With patient Time For Goal Achievement: 10/04/19 Potential to Achieve Goals: Good    Frequency Min 3X/week    AM-PAC PT "6 Clicks" Mobility  Outcome Measure Help needed turning from your back to your side while in a flat bed without using bedrails?: None Help needed moving from lying on your back to sitting on the side of a flat bed without using bedrails?: A Little Help needed moving to and from a bed to a chair (including a wheelchair)?: None Help needed standing up from a chair using your arms (e.g., wheelchair or bedside chair)?: None Help needed to walk in hospital room?: None Help needed climbing 3-5 steps with a railing? : A Little 6 Click Score: 22    End of Session Equipment Utilized During Treatment: Oxygen Activity Tolerance: Patient limited by fatigue Patient left: in chair;with call bell/phone within reach Nurse Communication: Mobility status PT Visit Diagnosis: Muscle weakness (generalized) (M62.81);Difficulty in walking, not elsewhere classified (R26.2)    Time: [id number] PT Time Calculation (min) (ACUTE ONLY): 31 min   Charges:   PT Evaluation $PT Eval Moderate Complexity: 1 Mod PT Treatments $Gait Training: 8-22 mins        Yaretzy Olazabal B. Migdalia Dk PT, DPT Acute Rehabilitation Services Pager [phone] Office [phone]   [place] 09/20/2019, 11:07 AM

## 2019-09-21 LAB — GLUCOSE, CAPILLARY
Glucose-Capillary: 234 mg/dL — ABNORMAL HIGH (ref 70–99)
Glucose-Capillary: 211 mg/dL — ABNORMAL HIGH (ref 70–99)
Glucose-Capillary: 236 mg/dL — ABNORMAL HIGH (ref 70–99)
Glucose-Capillary: 208 mg/dL — ABNORMAL HIGH (ref 70–99)
Glucose-Capillary: 219 mg/dL — ABNORMAL HIGH (ref 70–99)

## 2019-09-21 LAB — MAGNESIUM: Magnesium: 2 mg/dL (ref 1.7–2.4)

## 2019-09-21 MED ORDER — INSULIN ASPART 100 UNIT/ML ~~LOC~~ SOLN
7.0000 [IU] | Freq: Three times a day (TID) | SUBCUTANEOUS | Status: DC
  Administered 2019-09-21 – 2019-09-23 (×7): 7 [IU] via SUBCUTANEOUS

## 2019-09-21 MED ORDER — INSULIN GLARGINE 100 UNIT/ML ~~LOC~~ SOLN
13.0000 [IU] | Freq: Every day | SUBCUTANEOUS | Status: DC
  Administered 2019-09-21 – 2019-09-23 (×3): 13 [IU] via SUBCUTANEOUS
  Filled 2019-09-21 (×3): qty 0.13

## 2019-09-21 NOTE — Evaluation (Signed)
Occupational Therapy Evaluation Patient Details Name: [name] MRN: [id number] DOB: 1951-01-08 Today's Date: 09/21/2019    History of Present Illness 69 yr old woman with PMH of HTN, Dm2, CAD, PAF on anticoagulation, chronic back pain, TIA, breast cancer status post lumpectomy with radiation, and depression. Presented to ED with O2 sats in the 86% range on RA and found to be COVID+. Admitted 09/15/19 for treatement of Acute respiratory failure with hypoxia secondary to COVID PNA   Clinical Impression   PTA, pt living with her daughter and granddaughter and was independent and working. Pt currently performing ADLs and functional mobility at Supervision level. Pt presenting with poor activity tolerance and requiring several rest breaks during mobility. SpO2 dropping to low 80s on 6L and then 8L. Pt requiring supine rest break and ~2 minutes to return to 90s; returned to 5L. Pt would benefit from further acute OT to facilitate safe dc. Recommend dc to home once medically stable per physician.       Follow Up Recommendations  No OT follow up;Supervision/Assistance - 24 hour    Equipment Recommendations  3 in 1 bedside commode(As shower seat)    Recommendations for Other Services PT consult     Precautions / Restrictions Precautions Precautions: None Restrictions Weight Bearing Restrictions: No      Mobility Bed Mobility Overal bed mobility: Modified Independent             General bed mobility comments: HoB elevated and use fo bed rail to pull to EoB  Transfers Overall transfer level: Needs assistance Equipment used: None Transfers: Sit to/from Stand Sit to Stand: Supervision         General transfer comment: Supervision for safety as pt presenting with fatigue    Balance Overall balance assessment: Mild deficits observed, not formally tested                                         ADL either performed or assessed with clinical judgement    ADL Overall ADL's : Needs assistance/impaired                                       General ADL Comments: Pt performing ADLs and functional mobility with Supervision. However, pt presenting with poor activity tolerance. Pt donning her socks, mobility in hallway, toileting, and hand hygiene.     Vision Baseline Vision/History: Wears glasses Patient Visual Report: No change from baseline       Perception     Praxis      Pertinent Vitals/Pain Pain Assessment: Faces Faces Pain Scale: No hurt Pain Intervention(s): Monitored during session     Hand Dominance Right   Extremity/Trunk Assessment Upper Extremity Assessment Upper Extremity Assessment: Overall WFL for tasks assessed   Lower Extremity Assessment Lower Extremity Assessment: Defer to PT evaluation   Cervical / Trunk Assessment Cervical / Trunk Assessment: Normal   Communication Communication Communication: No difficulties   Cognition Arousal/Alertness: Awake/alert Behavior During Therapy: WFL for tasks assessed/performed Overall Cognitive Status: Within Functional Limits for tasks assessed                                     General Comments  Pt on 5L O2 upon  arrival. During mobility, pt dropping to 84% on 6L. Stopped to purse lip breathing and pt with difficulty elevatign to >88%. Elevate to 8L O2. Pt dropping to low 80s again and requiring seated rest break to return to 90%. Left pt on 5L O2.     Exercises     Shoulder Instructions      Home Living Family/patient expects to be discharged to:: Private residence Living Arrangements: Children Available Help at Discharge: Family;Available 24 hours/day Type of Home: House Home Access: Stairs to enter [employer] of Steps: 2   Home Layout: One level     Bathroom Shower/Tub: Occupational psychologist: Handicapped height Bathroom Accessibility: No   Home Equipment: Environmental consultant - 2 wheels;Cane - single  point          Prior Functioning/Environment Level of Independence: Independent        Comments: Works at [employer] Problem List: Decreased strength;Decreased range of motion;Decreased activity tolerance;Impaired balance (sitting and/or standing)      OT Treatment/Interventions: Self-care/ADL training;Therapeutic exercise;Energy conservation;DME and/or AE instruction;Therapeutic activities;Patient/family education    OT Goals(Current goals can be found in the care plan section) Acute Rehab OT Goals Patient Stated Goal: feel better OT Goal Formulation: With patient Time For Goal Achievement: 10/05/19 Potential to Achieve Goals: Good  OT Frequency: Min 2X/week   Barriers to D/C:            Co-evaluation              AM-PAC OT "6 Clicks" Daily Activity     Outcome Measure Help from another person eating meals?: None Help from another person taking care of personal grooming?: None Help from another person toileting, which includes using toliet, bedpan, or urinal?: None Help from another person bathing (including washing, rinsing, drying)?: None Help from another person to put on and taking off regular upper body clothing?: None Help from another person to put on and taking off regular lower body clothing?: None 6 Click Score: 24   End of Session Equipment Utilized During Treatment: Oxygen Nurse Communication: Mobility status  Activity Tolerance: Patient tolerated treatment well Patient left: in bed;with call bell/phone within reach  OT Visit Diagnosis: Unsteadiness on feet (R26.81);Other abnormalities of gait and mobility (R26.89);Muscle weakness (generalized) (M62.81)                Time: [id number] OT Time Calculation (min): 21 min Charges:  OT General Charges $OT Visit: 1 Visit OT Evaluation $OT Eval Moderate Complexity: [place], OTR/L Acute Rehab Pager: [phone] Office: [place] 09/21/2019, 5:35 PM

## 2019-09-21 NOTE — TOC Initial Note (Signed)
Transition of Care [facility]) - Initial/Assessment Note    Patient Details  Name: [name] MRN: [id number] Date of Birth: Sep 27, 1950  [facility] CM/SW Contact:    Maryclare Labrador, RN Phone Number: 09/21/2019, 12:01 PM  Clinical Narrative:   PTA independent from home with adult daughter and grand daughter.  Pt has PCP and denied barriers with paying for medications.  TOC will continue to follow for discharge needs. Pt currently on HFNC 4 liters and IV decadron               Expected Discharge Plan: Home/Self Care     Patient Goals and CMS Choice        Expected Discharge Plan and Services Expected Discharge Plan: Home/Self Care       Living arrangements for the past 2 months: Single Family Home                                      Prior Living Arrangements/Services Living arrangements for the past 2 months: Single Family Home Lives with:: Adult Children Patient language and need for interpreter reviewed:: Yes        Need for Family Participation in Patient Care: No (Comment) Care giver support system in place?: Yes (comment)   Criminal Activity/Legal Involvement Pertinent to Current Situation/Hospitalization: No - Comment as needed  Activities of Daily Living Home Assistive Devices/Equipment: None ADL Screening (condition at time of admission) Patient's cognitive ability adequate to safely complete daily activities?: Yes Is the patient deaf or have difficulty hearing?: No Does the patient have difficulty seeing, even when wearing glasses/contacts?: No Does the patient have difficulty concentrating, remembering, or making decisions?: No Patient able to express need for assistance with ADLs?: Yes Does the patient have difficulty dressing or bathing?: No Independently performs ADLs?: Yes (appropriate for developmental age) Does the patient have difficulty walking or climbing stairs?: No Weakness of Legs: None Weakness of Arms/Hands:  None  Permission Sought/Granted   Permission granted to share information with : Yes, Verbal Permission Granted              Emotional Assessment   Attitude/Demeanor/Rapport: Self-Confident, Engaged Affect (typically observed): Accepting, Adaptable Orientation: : Oriented to Self, Oriented to Place, Oriented to  Time, Oriented to Situation      Admission diagnosis:  Acute left-sided low back pain without sciatica [M54.5] COVID-19 virus infection [U07.1] Pneumonia due to COVID-19 virus [U07.1, J12.82] COVID-19 [U07.1] Patient Active Problem List   Diagnosis Date Noted  . Pneumonia due to COVID-19 virus 09/15/2019  . AF (paroxysmal atrial fibrillation) ([place]) 09/15/2019  . Chronic anticoagulation 09/15/2019  . Acute respiratory failure with hypoxia ([place]) 09/15/2019  . Anemia, chronic disease 10/15/2017  . Type 2 diabetes mellitus without complication, without long-term current use of insulin ([place]) 10/15/2017  . Hyperlipidemia associated with type 2 diabetes mellitus ([place]) 10/15/2017  . Claustrophobia 07/05/2015  . Bilateral edema of lower extremity 09/11/2014  . CKD stage 3 due to type 2 diabetes mellitus ([place]) 03/07/2014  . Restless leg syndrome 11/29/2013  . Other and unspecified hyperlipidemia 11/29/2013  . Osteoarthritis 11/29/2013  . CKD (chronic kidney disease) 08/16/2013  . Insomnia 08/16/2013  . Atrial flutter- not confirmed on ECG review 12/15/2012  . Loss of weight 12/15/2012  . Gastroparesis 12/14/2012  . Renal insufficiency 12/14/2012  . History of malignant neoplasm of right breast 01/01/2012  . Arthritis   .  Hx of radiation therapy   . OSTEOARTHRITIS, HANDS, BILATERAL 05/29/2009  . CHEST PAIN, ATYPICAL 02/09/2008  . LOW BACK PAIN, ACUTE 01/03/2007  . Diabetes mellitus due to underlying condition with diabetic nephropathy ([place]) 11/04/2006  . HYPERCHOLESTEROLEMIA 11/04/2006  . HYPERTENSION, BENIGN SYSTEMIC 11/04/2006  . Cardiomyopathy-recovered 11/04/2006   . MENOPAUSAL SYNDROME 11/04/2006  . OSTEOARTHRITIS, LOWER LEG 11/04/2006  . GAS/BLOATING 11/04/2006   PCP:  Lauree Chandler, NP Pharmacy:   [id number] [place], [place] [place] [address] Phone: [phone] Fax: [phone]  [facility] [place] [place] [facility] [address] [place] [address] Phone: [phone] Fax: [phone]     Social Determinants of Health (SDOH) Interventions    Readmission Risk Interventions No flowsheet data found.

## 2019-09-21 NOTE — Progress Notes (Addendum)
PROGRESS NOTE   [name]  [id number]    DOB: December 22, 1950    DOA: 09/15/2019  PCP: Lauree Chandler, NP   I have briefly reviewed patients previous medical records in [facility].  Chief Complaint:   Chief Complaint  Patient presents with  . Back Pain    Brief Narrative:  69 year old female with PMH of hypertension, type II DM, CAD, PAF on anticoagulation,?  Chronic back pain, TIA, breast cancer s/p lumpectomy with radiation and depression presented with 1 week history of productive cough leading to worsening of mid to low back pain, noted to be hypoxic at 86% on room air and COVID-19 positive.  Admitted for acute respiratory failure with hypoxia due to Covid 19 and bacterial pneumonia.   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  Completed 5-day course of IV ceftriaxone, azithromycin and remdesivir on 1/12.  On admission patient had mildly elevated procalcitonin and productive cough.  Continues to gradually improve but still DOE on mild exertion and hypoxia requiring 4 L/min HFNC oxygen.  Continue incentive spirometry, Decadron, wean oxygen as tolerated.  Blood cultures x2: Negative.  Essential hypertension  Mildly uncontrolled at times.  Increased amlodipine from 5 to 10 mg daily.  Continue carvedilol 25 mg twice daily.  Consider resuming losartan at discharge or earlier in the hospital if needed.    Type II DM with hyperglycemia  A1c 6.4 in August 2020.  Hyperglycemia precipitated  by steroids.  Holding Metformin.  DM coordinator follow-up appreciated.  Increased Lantus from 10 to 13 units daily.  Continue NovoLog SSI.  Increase NovoLog mealtime from 3 to 7 units 3 times daily.  Monitor closely.  Dehydration  She presented with creatinine of 1.37 and BUN of 25.  Likely due to poor oral intake.  S/p IV fluids.  Creatinine normalized.  Continue to hold ARB.  Dehydration resolved.  Paroxysmal atrial fibrillation  Currently in sinus rhythm.  Continue carvedilol and Eliquis anticoagulation.  Abnormal LFTs  Likely due to COVID-19 infection.  Resolved.  Hyperlipidemia  Continue atorvastatin.  Restless leg syndrome  Continue ropinirole.  Low back pain  Denies chronic back pain.  Reportedly has had it for a couple of weeks now.  Possibly related to cough.  Indicates improvement on Tylenol and tramadol.  Mobilize out of bed as much as possible.  Improved.  Hypomagnesemia  Replaced.  Anemia  Possibly anemia of chronic disease.  Stable.  Body mass index is 29.32 kg/m.   DVT prophylaxis: Eliquis Code Status: Full Family Communication: None at bedside.  I called patient's daughter via phone, updated care and answered questions. Disposition: Patient presented from home.  Plan to DC home without PT follow-up pending improvement in her dyspnea and hypoxia, hopefully in the next 1 to 2 days.   Consultants:   None  Procedures:   None  Antimicrobials:   Ceftriaxone and azithromycin-completed 1/12   Subjective:  Reports that she is slowly improving.  No dyspnea at rest.  Still has dyspnea on mild exertion even walking from her hospital bed to the door.  Productive cough is significantly improved, mild and mostly dry now.  States that she ambulated in the hall yesterday.  No pain reported today.  Objective:   Vitals:   09/21/19 1045 09/21/19 1100 09/21/19 1115 09/21/19 1130  BP:      Pulse: 80 77 77 73  Resp: 18 19 19 19   Temp:      TempSrc:       SpO2: (!) 86% 92% 90% 92%  Weight:      Height:        General exam: Pleasant elderly female, moderately built and nourished lying comfortably propped up in bed without distress.  Oral mucosa moist.  Had intermittent dry cough. Respiratory system: Occasional basal crackles and occasional rhonchi posteriorly.  Otherwise clear to auscultation.  No increased work of breathing. Cardiovascular system: S1 and S2 heard, RRR.  No JVD, murmurs or pedal edema.  Telemetry personally reviewed: Mostly sinus rhythm.  Occasional sinus bradycardia in the 50s. Gastrointestinal system: Abdomen is nondistended, soft and nontender. No organomegaly or masses felt. Normal bowel sounds heard. Central nervous system: Alert and oriented. No focal neurological deficits. Extremities: Symmetric 5 x 5 power. Skin: No rashes, lesions or ulcers Psychiatry: Judgement and insight appear normal. Mood & affect appropriate.     Data Reviewed:   I have personally reviewed following labs and imaging studies   CBC: Recent Labs  Lab 09/18/19 0645 09/19/19 0420 09/20/19 0349  WBC 5.7 5.0 6.0  NEUTROABS 5.2 4.1 5.5  HGB 11.4* 11.5* 11.5*  HCT 34.0* 33.7* 33.3*  MCV 94.7 94.1 92.8  PLT 276 277 0000000    Basic Metabolic Panel: Recent Labs  Lab 09/18/19 0645 09/18/19 0645 09/19/19 0420 09/20/19 0349 09/21/19 0225  NA 138  --  136 136  --   K 4.2  --  4.5 4.5  --   CL 106  --  106 103  --   CO2 21*  --  20* 23  --   GLUCOSE 258*  --  291* 298*  --   BUN 22  --  23 27*  --   CREATININE 0.92  --  0.98 0.96  --   CALCIUM 8.6*  --  8.5* 8.9  --   MG 1.7   < > 1.7 1.6* 2.0  PHOS 3.6  --  2.7 2.9  --    < > = values in this interval not displayed.    Liver Function Tests: Recent Labs  Lab 09/18/19 0645 09/19/19 0420 09/20/19 0349  AST 30 30 23   ALT 26 26 24   ALKPHOS 56 57 60  BILITOT 0.6 0.7 0.5  PROT 6.5 6.3* 6.4*  ALBUMIN 3.0* 2.8* 2.8*    CBG: Recent Labs  Lab 09/20/19 1613 09/20/19 2148  09/21/19 0755  GLUCAP 238* 305* 234*    Microbiology Studies:   Recent Results (from the past 240 hour(s))  Blood Culture (routine x 2)     Status: None   Collection Time: 09/15/19  2:50 PM   Specimen: BLOOD  Result Value Ref Range Status   Specimen Description BLOOD LEFT ANTECUBITAL  Final   Special Requests   Final    BOTTLES DRAWN AEROBIC AND ANAEROBIC Blood Culture results may not be optimal due to an inadequate volume of blood received in culture bottles   Culture   Final    NO GROWTH 5 DAYS Performed at [facility], [place] [address] [place], [place] [address]    Report Status 09/20/2019 FINAL  Final  Blood Culture (routine x 2)     Status: None   Collection Time: 09/15/19  2:51 PM   Specimen: BLOOD  Result Value Ref Range Status   Specimen Description BLOOD BLOOD RIGHT FOREARM  Final   Special Requests   Final    BOTTLES DRAWN AEROBIC AND ANAEROBIC Blood Culture adequate volume   Culture   Final    NO GROWTH 5 DAYS Performed at [facility], [address], [place],  [address]    Report Status 09/20/2019 FINAL  Final     Radiology Studies:  No results found.   Scheduled Meds:   . albuterol  2 puff Inhalation Q6H  . amLODipine  10 mg Oral Daily  . apixaban  5 mg Oral BID  . vitamin C  500 mg Oral Daily  . atorvastatin  40 mg Oral q1800  . carvedilol  25 mg Oral BID WC  . dexamethasone (DECADRON) injection  6 mg Intravenous Q24H  . insulin aspart  0-5 Units Subcutaneous QHS  . insulin aspart  0-9 Units Subcutaneous TID WC  . insulin aspart  3 Units Subcutaneous TID WC  . insulin glargine  13 Units Subcutaneous Daily  . mouth rinse  15 mL Mouth Rinse BID  . rOPINIRole  0.25 mg Oral QHS  . sodium chloride flush  3 mL Intravenous Q12H  . zinc sulfate  220 mg Oral Daily    Continuous Infusions:     LOS: 6 days     Vernell Leep, MD, [place], [facility]. [facility]    To contact the attending provider between 7A-7P or the covering  provider during after hours 7P-7A, please log into the web site [email or web address] and access using universal [place] password for that web site. If you do not have the password, please call the hospital operator.  09/21/2019, 12:03 PM

## 2019-09-21 NOTE — Progress Notes (Signed)
Inpatient Diabetes Program Recommendations  AACE/ADA: New Consensus Statement on Inpatient Glycemic Control   Target Ranges:  Prepandial:   less than 140 mg/dL      Peak postprandial:   less than 180 mg/dL (1-2 hours)      Critically ill patients:  140 - 180 mg/dL   Results for [name], [name] (MRN [id number]) as of 09/21/2019 11:01  Ref. Range 09/20/2019 07:58 09/20/2019 11:52 09/20/2019 16:13 09/20/2019 21:48 09/21/2019 07:55  Glucose-Capillary Latest Ref Range: 70 - 99 mg/dL 252 (H) 215 (H) 238 (H) 305 (H) 234 (H)   Review of Glycemic Control  Diabetes history:DM2 Outpatient Diabetes medications:Metformin 1000 mg BID Current orders for Inpatient glycemic control: Lantus 13 units daily,Novolog 0-9 units TID with meals, Novolog 0-5 units QHS, Novolog 3 units TID with meals; Decadron 6 mg Q24H  Inpatient Diabetes Program Recommendations:  Insulin-Meal Coverage: If steroids are continued, please consider increasing meal coverage to Novolog 7 units TID with meals if patient eats at least 50% of meals.  Thanks, Barnie Alderman, RN, MSN, CDE Diabetes Coordinator Inpatient Diabetes Program [phone] (Team Pager from 8am to 5pm)

## 2019-09-22 LAB — GLUCOSE, CAPILLARY
Glucose-Capillary: 227 mg/dL — ABNORMAL HIGH (ref 70–99)
Glucose-Capillary: 170 mg/dL — ABNORMAL HIGH (ref 70–99)
Glucose-Capillary: 150 mg/dL — ABNORMAL HIGH (ref 70–99)
Glucose-Capillary: 326 mg/dL — ABNORMAL HIGH (ref 70–99)

## 2019-09-22 NOTE — Progress Notes (Signed)
PROGRESS NOTE   [name]  [id number]    DOB: [birth year]    DOA: 09/15/2019  PCP: Lauree Chandler, NP   I have briefly reviewed patients previous medical records in [facility].  Chief Complaint:   Chief Complaint  Patient presents with  . Back Pain    Brief Narrative:  69 year old female with PMH of hypertension, type II DM, CAD, PAF on anticoagulation,?  Chronic back pain, TIA, breast cancer s/p lumpectomy with radiation and depression presented with 1 week history of productive cough leading to worsening of mid to low back pain, noted to be hypoxic at 86% on room air and COVID-19 positive.  Admitted for acute respiratory failure with hypoxia due to Covid 19 and bacterial pneumonia.  Slowly improving but still quite dyspneic and hypoxic with minimal activity this morning.   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  Completed 5-day course of IV ceftriaxone, azithromycin and remdesivir on 1/12.  On admission patient had mildly elevated procalcitonin and productive cough.  Continue incentive spirometry, Decadron, wean oxygen as tolerated.  Blood cultures x2: Negative.  Has gradually improved.  Dyspnea at rest has significantly improved.  However still dyspneic on mild exertion.  Witnessed patient ambulating with RN just outside her room with respiratory distress, oxygen saturations dropped to 80% even on 6 L/min [place] oxygen.  Has completed remdesivir.  Continue Decadron.  Essential  hypertension  Mildly uncontrolled at times.  Increased amlodipine from 5 to 10 mg daily.  Continue carvedilol 25 mg twice daily.  Consider resuming losartan at discharge or earlier in the hospital if needed.  BP better.  Type II DM with hyperglycemia  A1c 6.4 in August 2020.  Hyperglycemia precipitated by steroids.  Holding Metformin.  DM coordinator follow-up appreciated.  Increased Lantus from 10 to 13 units daily.  Continue NovoLog SSI.  Increased NovoLog mealtime from 3 to 7 units 3 times daily.  Monitor closely.  Seems to be better today.  Dehydration  She presented with creatinine of 1.37 and BUN of 25.  Likely due to poor oral intake.  S/p IV fluids.  Creatinine normalized.  Continue to hold ARB.  Dehydration resolved.  Paroxysmal atrial fibrillation  Currently in sinus rhythm.  Continue carvedilol and Eliquis anticoagulation.  Abnormal LFTs  Likely due to COVID-19 infection.  Resolved.  Hyperlipidemia  Continue atorvastatin.  Restless leg syndrome  Continue ropinirole.  Low back pain  Denies chronic back pain.  Reportedly has had it for a couple of weeks now.  Possibly related to cough.  Indicates improvement on Tylenol and tramadol.  Mobilize out of bed as much as possible.  Improved.  Hypomagnesemia  Replaced.  Anemia  Possibly anemia of chronic disease.  Stable.  Body mass index is 29.32 kg/m.   DVT prophylaxis: Eliquis Code Status: Full Family Communication: None at bedside.  I called patient's daughter via phone on 1/14, updated care and answered questions. Disposition: Patient presented from home.  Plan to DC home without PT follow-up pending improvement in her dyspnea and hypoxia, hopefully in the next  1 to 2 days.  Not medically ready for discharge today   Consultants:   None  Procedures:   None  Antimicrobials:   Ceftriaxone and azithromycin-completed 1/12   Subjective:  Not much dyspnea at rest.  Still with substantial dyspnea on mild  exertion i.e. even walking just outside her hospital room door with RN.  No chest pain.  Objective:   Vitals:   09/22/19 0902 09/22/19 0903 09/22/19 0904 09/22/19 1332  BP:      Pulse:    76  Resp:    17  Temp:    97.7 F (36.5 C)  TempSrc:    Oral  SpO2: 92% (!) 80% (!) 81% 98%  Weight:      Height:        General exam: Pleasant elderly female, moderately built and nourished seen ambulating with RN in the hall with increased work of breathing. Respiratory system: Occasional basal crackles but otherwise clear without wheezing, rhonchi.  Mild increased work of breathing with activity. Cardiovascular system: S1 and S2 heard, RRR.  No JVD, murmurs or pedal edema.  Stable without change. Gastrointestinal system: Abdomen is nondistended, soft and nontender. No organomegaly or masses felt. Normal bowel sounds heard. Central nervous system: Alert and oriented. No focal neurological deficits. Extremities: Symmetric 5 x 5 power. Skin: No rashes, lesions or ulcers Psychiatry: Judgement and insight appear normal. Mood & affect appropriate.     Data Reviewed:   I have personally reviewed following labs and imaging studies   CBC: Recent Labs  Lab 09/18/19 0645 09/19/19 0420 09/20/19 0349  WBC 5.7 5.0 6.0  NEUTROABS 5.2 4.1 5.5  HGB 11.4* 11.5* 11.5*  HCT 34.0* 33.7* 33.3*  MCV 94.7 94.1 92.8  PLT 276 277 0000000    Basic Metabolic Panel: Recent Labs  Lab 09/18/19 0645 09/18/19 0645 09/19/19 0420 09/20/19 0349 09/21/19 0225  NA 138  --  136 136  --   K 4.2  --  4.5 4.5  --   CL 106  --  106 103  --   CO2 21*  --  20* 23  --   GLUCOSE 258*  --  291* 298*  --   BUN 22  --  23 27*  --   CREATININE 0.92  --  0.98 0.96  --   CALCIUM 8.6*  --  8.5* 8.9  --   MG 1.7   < > 1.7 1.6* 2.0  PHOS 3.6  --  2.7 2.9  --    < > = values in this interval not displayed.    Liver Function Tests: Recent Labs  Lab 09/18/19 0645 09/19/19 0420 09/20/19 0349  AST 30 30 23   ALT 26 26 24    ALKPHOS 56 57 60  BILITOT 0.6 0.7 0.5  PROT 6.5 6.3* 6.4*  ALBUMIN 3.0* 2.8* 2.8*    CBG: Recent Labs  Lab 09/22/19 0808 09/22/19 1153 09/22/19 1633  GLUCAP 227* 170* 150*    Microbiology Studies:   Recent Results (from the past 240 hour(s))  Blood Culture (routine x 2)     Status: None   Collection Time: 09/15/19  2:50 PM   Specimen: BLOOD  Result Value Ref Range Status   Specimen Description BLOOD LEFT ANTECUBITAL  Final   Special Requests   Final    BOTTLES DRAWN AEROBIC AND ANAEROBIC Blood Culture results may not be optimal due to an inadequate volume of blood received in culture bottles   Culture   Final  NO GROWTH 5 DAYS Performed at [facility], [place] [address], [place], [place] [address]    Report Status 09/20/2019 FINAL  Final  Blood Culture (routine x 2)     Status: None   Collection Time: 09/15/19  2:51 PM   Specimen: BLOOD  Result Value Ref Range Status   Specimen Description BLOOD BLOOD RIGHT FOREARM  Final   Special Requests   Final    BOTTLES DRAWN AEROBIC AND ANAEROBIC Blood Culture adequate volume   Culture   Final    NO GROWTH 5 DAYS Performed at [facility], [place] [address], [place], [place] [address]    Report Status 09/20/2019 FINAL  Final     Radiology Studies:  No results found.   Scheduled Meds:   . albuterol  2 puff Inhalation Q6H  . amLODipine  10 mg Oral Daily  . apixaban  5 mg Oral BID  . vitamin C  500 mg Oral Daily  . atorvastatin  40 mg Oral q1800  . carvedilol  25 mg Oral BID WC  . dexamethasone (DECADRON) injection  6 mg Intravenous Q24H  . insulin aspart  0-5 Units Subcutaneous QHS  . insulin aspart  0-9 Units Subcutaneous TID WC  . insulin aspart  7 Units Subcutaneous TID WC  . insulin glargine  13 Units Subcutaneous Daily  . mouth rinse  15 mL Mouth Rinse BID  . rOPINIRole  0.25 mg Oral QHS  . sodium chloride flush  3 mL Intravenous Q12H  . zinc sulfate  220 mg Oral Daily    Continuous Infusions:       LOS: 7 days     Vernell Leep, MD, [place], [facility]. [facility]    To contact the attending provider between 7A-7P or the covering provider during after hours 7P-7A, please log into the web site [email or web address] and access using universal [place] password for that web site. If you do not have the password, please call the hospital operator.  09/22/2019, 5:33 PM

## 2019-09-22 NOTE — Progress Notes (Signed)
Physical Therapy Treatment Patient Details Name: [name] MRN: [id number] DOB: [birth year] Today's Date: 09/22/2019    History of Present Illness 69 yr old woman with PMH of HTN, Dm2, CAD, PAF on anticoagulation, chronic back pain, TIA, breast cancer status post lumpectomy with radiation, and depression. Presented to ED with O2 sats in the 86% range on RA and found to be COVID+. Admitted 09/15/19 for treatement of Acute respiratory failure with hypoxia secondary to COVID PNA    PT Comments    Pt eager to get out of bed and ambulate with therapy. Pt is independent with bed mobility, mod I for transfers and supervision for ambulation of 80 feet without AD. Pt continues to be limited in safe mobility by oxygen desaturation (see General Comments). D/c plans remain appropriate at this time. PT will continue to follow acutely.    Follow Up Recommendations  No PT follow up;Supervision for mobility/OOB     Equipment Recommendations  None recommended by PT    Recommendations for Other Services OT consult(for energy conservation)     Precautions / Restrictions Precautions Precautions: None Restrictions Weight Bearing Restrictions: No    Mobility  Bed Mobility Overal bed mobility: Independent                Transfers Overall transfer level: Modified independent Equipment used: None                Ambulation/Gait Ambulation/Gait assistance: Supervision Gait Distance (Feet): 80 Feet(1x80, 1x20) Assistive device: None Gait Pattern/deviations: Step-through pattern;WFL(Within Functional Limits) Gait velocity: slowed Gait velocity interpretation: <1.8 ft/sec, indicate of risk for recurrent falls General Gait Details: slow, steady gait requiring 2x standing rest breaks in attempt to recover SaO2         Balance Overall balance assessment: Mild deficits observed, not formally tested                                          Cognition  Arousal/Alertness: Awake/alert Behavior During Therapy: WFL for tasks assessed/performed Overall Cognitive Status: Within Functional Limits for tasks assessed                                           General Comments General comments (skin integrity, edema, etc.): Pt on 3L O2 via [place] on entry with SaO2 96%O2, pt was able to ambulate to bathroom and back while maintaining SaO2 >91%O2, with longer distance ambulation on 4L O2 SaO2 dropped to 80%O2 increased supplemental O2 to 6L O2 and pt rebounded to 85%O2 with pursed lipped breathing and standing rest break, pt returned to room with 3/4 DoE and drop of SaO2 back to 80%O2, with sitting and pursed lipped breathing SaO2 rebounded to 92%O2 and pt switched back to 3L O2 via wall O2 and pt oxygen saturation continued to improve to 96%O2 withing 3 minutes of returning to room      Pertinent Vitals/Pain Pain Assessment: No/denies pain           PT Goals (current goals can now be found in the care plan section) Acute Rehab PT Goals Patient Stated Goal: feel better PT Goal Formulation: With patient Time For Goal Achievement: 10/04/19 Potential to Achieve Goals: Good Progress towards PT goals: Progressing toward goals    Frequency    Min  3X/week      PT Plan Current plan remains appropriate       AM-PAC PT "6 Clicks" Mobility   Outcome Measure  Help needed turning from your back to your side while in a flat bed without using bedrails?: None Help needed moving from lying on your back to sitting on the side of a flat bed without using bedrails?: A Little Help needed moving to and from a bed to a chair (including a wheelchair)?: None Help needed standing up from a chair using your arms (e.g., wheelchair or bedside chair)?: None Help needed to walk in hospital room?: None Help needed climbing 3-5 steps with a railing? : A Little 6 Click Score: 22    End of Session Equipment Utilized During Treatment: Oxygen Activity  Tolerance: Patient limited by fatigue Patient left: in chair;with call bell/phone within reach Nurse Communication: Mobility status PT Visit Diagnosis: Muscle weakness (generalized) (M62.81);Difficulty in walking, not elsewhere classified (R26.2)     Time: [id number] PT Time Calculation (min) (ACUTE ONLY): 24 min  Charges:  $Gait Training: 23-37 mins                     [name] B. Migdalia Dk PT, DPT Acute Rehabilitation Services Pager [phone] Office [phone]    [place] 09/22/2019, 4:15 PM

## 2019-09-22 NOTE — TOC Progression Note (Signed)
Transition of Care [facility]) - Progression Note    Patient Details  Name: [name] MRN: [id number] Date of Birth: 09/28/1950  [facility] of [facility] CM/SW Contact  Maryclare Labrador, RN Phone Number: 09/22/2019, 8:22 AM  Clinical Narrative:   CM spoke with pt regarding orders 3:1 - pt declined 3:1 - stating "I don't need that".    Expected Discharge Plan: Home/Self Care    Expected Discharge Plan and Services Expected Discharge Plan: Home/Self Care       Living arrangements for the past 2 months: Single Family Home                                       Social Determinants of Health (SDOH) Interventions    Readmission Risk Interventions No flowsheet data found.

## 2019-09-22 NOTE — Plan of Care (Signed)

## 2019-09-22 NOTE — Progress Notes (Signed)
Home O2 evaluation done. Patient sitting in bed on room air O2 sats at 91%. When ambulating on room air O2 sats drop to below 80%. Patient ambulating on 6L [place] O2 sats at 80%. Patient sitting in bed on 3L at 93%.

## 2019-09-23 LAB — GLUCOSE, CAPILLARY
Glucose-Capillary: 279 mg/dL — ABNORMAL HIGH (ref 70–99)
Glucose-Capillary: 273 mg/dL — ABNORMAL HIGH (ref 70–99)
Glucose-Capillary: 231 mg/dL — ABNORMAL HIGH (ref 70–99)

## 2019-09-23 MED ORDER — ASCORBIC ACID 500 MG PO TABS: 500.0000 mg | ORAL_TABLET | Freq: Every day | ORAL | 0 refills | Status: DC

## 2019-09-23 MED ORDER — DEXAMETHASONE 6 MG PO TABS: 6.0000 mg | ORAL_TABLET | Freq: Every day | ORAL | 0 refills | Status: AC

## 2019-09-23 MED ORDER — ZINC SULFATE 220 (50 ZN) MG PO CAPS: 220.0000 mg | ORAL_CAPSULE | Freq: Every day | ORAL | 0 refills | Status: DC

## 2019-09-23 MED ORDER — GLIPIZIDE 5 MG PO TABS: 2.5000 mg | ORAL_TABLET | Freq: Every day | ORAL | 0 refills | Status: DC

## 2019-09-23 MED ORDER — GUAIFENESIN-DM 100-10 MG/5ML PO SYRP: 10.0000 mL | ORAL_SOLUTION | ORAL | 0 refills | Status: DC | PRN

## 2019-09-23 MED ORDER — TRAMADOL HCL 50 MG PO TABS: 50.0000 mg | ORAL_TABLET | Freq: Every day | ORAL | Status: AC | PRN

## 2019-09-23 MED ORDER — ALBUTEROL SULFATE HFA 108 (90 BASE) MCG/ACT IN AERS: 2.0000 | INHALATION_SPRAY | Freq: Four times a day (QID) | RESPIRATORY_TRACT | 0 refills | Status: DC | PRN

## 2019-09-23 NOTE — Progress Notes (Signed)
SATURATION QUALIFICATIONS: (This note is used to comply with regulatory documentation for home oxygen)  Patient Saturations on Room Air at Rest = 96%  Patient Saturations on Room Air while Ambulating =    87 %  Patient Saturations on 3 Liters of oxygen while Ambulating = 93%  Please briefly explain why patient needs home oxygen: Patient oxygen saturation drop with exertion ambulating down the hall, 3L oxygen by N/C apply pt tolerated that well.

## 2019-09-23 NOTE — Progress Notes (Signed)
Darylene Price Bentler to be D/C'd Home per MD order.  Discussed with the patient and all questions fully answered.  VSS, Skin clean, dry and intact without evidence of skin break down, no evidence of skin tears noted. IV catheter discontinued intact. Site without signs and symptoms of complications. Dressing and pressure applied.  An After Visit Summary was printed and given to the patient. Patient received prescription.  D/c education completed with patient/family including follow up instructions, medication list, d/c activities limitations if indicated, with other d/c instructions as indicated by MD - patient able to verbalize understanding, all questions fully answered.   Patient instructed to return to ED, call 911, or call MD for any changes in condition.   Patient escorted via [place], and D/C home via private auto.  Oxygen equipment deliver to the room pt was educated and D/C with oxygen home.   Lorenza Evangelist [facility] 09/23/2019 6:42 PM

## 2019-09-23 NOTE — Care Management (Signed)
Notified [employer] that patient will DC today with new O2 set up for home at 3L. Verified home address. Patient provided daughter's number (home number [phone]) as contact for delivery. Her daughter is picking her up and [employer] aware that she will need portable tank to room.

## 2019-09-23 NOTE — Discharge Instructions (Signed)

## 2019-09-23 NOTE — Discharge Summary (Addendum)
Physician Discharge Summary  Angelisa Winthrop Belsky DXI:[id number] DOB: 1951/02/13  PCP: Lauree Chandler, NP  Admitted from: Home Discharged to: Home  Admit date: 09/15/2019 Discharge date: 09/23/2019  Recommendations for Outpatient Follow-up:   Follow-up Information    Lauree Chandler, NP. Schedule an appointment as soon as possible for a visit in 1 week(s).   Specialty: Geriatric Medicine Why: To be seen with repeat labs (CBC & CMP).  Please evaluate diabetes control and adjust medications as needed. Contact information: [address] [place] [address] [phone]            Home Health: None Equipment/Devices: Oxygen via nasal cannula at 3 L/min continuously.  Discharge Condition: Improved and stable CODE STATUS: Full Diet recommendation: Heart healthy & diabetic diet.  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 Chronic back pain, TIA, breast cancer s/p lumpectomy with radiation and depression presented with 1 week history of productive cough leading to worsening of mid to low back pain, noted to be hypoxic at 86% on room air and COVID-19 positive.  Admitted for acute respiratory failure with hypoxia due to Covid 19 and bacterial pneumonia.    Assessment & Plan:   Acute respiratory failure with hypoxia due to COVID-19 +/- bacterial pneumonia  Chest x-ray 1/8: Prominent bibasilar pulmonary interstitial infiltrates/edema.  Completed 5-day course of IV ceftriaxone, azithromycin and remdesivir on 1/12.  On admission patient had mildly  elevated procalcitonin and productive cough. Blood cultures x2: Negative.   Has completed 6 days of IV Decadron in the hospital.  Has made gradual improvement.  Yesterday she was quite dyspneic with mild activity and hypoxic in the 80s even on 6 L/min oxygen while ambulating.  Today she feels much better, overall feels fatigued but dyspnea continues to gradually improve.  She was able to ambulate in the halls without much distress and her oxygen needs have also decreased.  She was saturating at 96% on room air at rest, dropped to 87% on room air while ambulating and required 3 L/min oxygen while ambulating to bring her up to 93%.  Given severity of her symptoms, she will be discharged on additional 5 days of oral Decadron, as needed albuterol inhaler for wheezing.  Patient and her daughter have been advised that she should quarantine for 21 days and they verbalized understanding.  Close outpatient follow-up with PCP.  May consider repeating chest x-ray in 4 weeks to ensure resolution of pneumonia findings.  Essential hypertension  Resume prior home medications including amlodipine, carvedilol and losartan.  In the hospital losartan had been temporarily held and amlodipine dose had been doubled.  Controlled.  Type II DM with hyperglycemia  A1c on 09/16/2019: 7 suggesting reasonable control on prior home Metformin 1000 mg daily.  Metformin was held in the hospital.  DM coordinator consulted.  She was treated with adjusted doses of Levemir, NovoLog mealtime and SSI.  Her hyperglycemia in the hospital was obviously precipitated by steroids.  This is expected to improve once steroids have been stopped.  Patient absolutely declines short course of insulins at discharge.  After discussing with pharmacy,  given her elderly age, she will be discharged on a small dose of glipizide only until she is on steroids.  Her prior home dose of Metformin alone will not adequately control her hyperglycemia.  Patient and daughter  have been advised to monitor CBGs closely at home and closely follow-up with PCP regarding further management.  They verbalized understanding.  Dehydration and acute kidney injury  Resolved.  Paroxysmal atrial fibrillation  Currently in sinus rhythm.  Continue carvedilol and Eliquis anticoagulation.  Abnormal LFTs  Likely due to COVID-19 infection.  Resolved.  Hyperlipidemia  Continue atorvastatin.  Restless leg syndrome  Continue ropinirole.  Low back pain  Denies chronic back pain.  Reportedly has had it for a couple of weeks now.  Possibly related to cough.  Indicates improvement on Tylenol and tramadol.  Mobilize out of bed as much as possible.  Improved.  Patient reports that she only takes tramadol once daily as needed for pain and has enough supply at home.  No new prescriptions were provided for this.  Hypomagnesemia  Replaced.  Anemia  Possibly anemia of chronic disease.  Stable.  Body mass index is 29.32 kg/m.    Consultants:   None  Procedures:   None   Discharge Instructions  Discharge Instructions    Call MD for:  difficulty breathing, headache or visual disturbances   Complete by: As directed    Call MD for:  extreme fatigue   Complete by: As directed    Call MD for:  persistant dizziness or light-headedness   Complete by: As directed    Call MD for:  persistant nausea and vomiting   Complete by: As directed    Call MD for:  severe uncontrolled pain   Complete by: As directed    Call MD for:  temperature >100.4   Complete by: As directed    Diet - low sodium heart healthy   Complete by: As directed    Diet Carb Modified   Complete by: As directed    Increase activity slowly   Complete by: As directed        Medication List    TAKE these medications   Accu-Chek Aviva Plus w/Device Kit Check blood sugar once daily as directed E11.59  Acc-Chek guide   Accu-Chek FastClix Lancets Misc Check blood sugar once daily as  directed E11.59   albuterol 108 (90 Base) MCG/ACT inhaler Commonly known as: VENTOLIN HFA Inhale 2 puffs into the lungs every 6 (six) hours as needed for wheezing or shortness of breath.   amLODipine 10 MG tablet Commonly known as: NORVASC Take 0.5 tablets (5 mg total) by mouth daily.   apixaban 5 MG Tabs tablet Commonly known as: Eliquis Take 1 tablet (5 mg total) by mouth 2 (two) times daily.   ascorbic acid 500 MG tablet Commonly known as: VITAMIN C Take 1 tablet (500 mg total) by mouth daily. Start taking on: September 24, 2019   atorvastatin 40 MG tablet Commonly known as: LIPITOR TAKE 1 TABLET DAILY FOR    CHOLESTEROL   Biofreeze 4 % Gel Generic drug: Menthol (Topical Analgesic) Apply 3 oz topically 3 (three) times daily as needed. Apply to both knees for pain   carvedilol 25 MG tablet Commonly known as: COREG Take 1 tablet (25 mg total) by mouth 2 (two) times daily with a meal.   dexamethasone 6 MG tablet Commonly known as: Decadron Take 1 tablet (6 mg total) by mouth daily for 5 days.   glipiZIDE 5  MG tablet Commonly known as: GLUCOTROL Take 0.5 tablets (2.5 mg total) by mouth daily before breakfast for 5 days. Start taking on: September 24, 2019   glucose blood test strip Commonly known as: Accu-Chek Guide Use as instructed Check blood sugar once daily as directed E11.59   guaiFENesin-dextromethorphan 100-10 MG/5ML syrup Commonly known as: ROBITUSSIN DM Take 10 mLs by mouth every 4 (four) hours as needed for cough.   Iron 325 (65 Fe) MG Tabs Take by mouth daily.   losartan 100 MG tablet Commonly known as: COZAAR TAKE 1 TABLET BY MOUTH DAILY TO CONTROL BLOOD PRESSURE.   metFORMIN 1000 MG tablet Commonly known as: GLUCOPHAGE Take 1,000 mg by mouth daily with breakfast.   rOPINIRole 0.25 MG tablet Commonly known as: Requip Take 1 tablet (0.25 mg total) by mouth at bedtime. For restless legs   traMADol 50 MG tablet Commonly known as: ULTRAM Take 1  tablet (50 mg total) by mouth daily as needed (Pain). What changed:   when to take this  reasons to take this  additional instructions   zinc sulfate 220 (50 Zn) MG capsule Take 1 capsule (220 mg total) by mouth daily. Start taking on: September 24, 2019      Allergies  Allergen Reactions  . Hydrocodone Nausea And Vomiting  . Lisinopril     Sick on the stomach      Procedures/Studies: DG Chest 2 View  Result Date: 09/15/2019 CLINICAL DATA:  Back pain.  COVID positive. EXAM: CHEST - 2 VIEW COMPARISON:  Chest x-ray 09/27/2015. FINDINGS: Mediastinum hilar structures normal. Stable cardiomegaly. No pulmonary venous congestion. Prominent bibasilar pulmonary interstitial infiltrates/edema. Small right pleural effusion. No pneumothorax. Degenerative changes thoracic spine. IMPRESSION: 1.  Stable cardiomegaly.  No pulmonary venous congestion. 2. Prominent bibasilar pulmonary interstitial infiltrates/edema. Small right pleural effusion. Electronically Signed   By: Marcello Moores  Register   On: 09/15/2019 11:39      Subjective: Feels better.  No dyspnea at rest.  Overall feels tired but that is gradually improving.  Mild dyspnea with activity but much improved compared to yesterday.  Mild intermittent dry cough.  No dizziness or lightheadedness.  As per RN, no acute issues reported.  Discharge Exam:  Vitals:   09/23/19 0526 09/23/19 0615 09/23/19 0858 09/23/19 1300  BP:  (!) 147/66 112/61 115/64  Pulse:  70 79 70  Resp:  17  17  Temp:  98.5 F (36.9 C)  98.4 F (36.9 C)  TempSrc:  Oral  Oral  SpO2:  98% 95% 96%  Weight: 86.2 kg     Height:        General exam: Pleasant elderly female, moderately built and nourished sitting up comfortably in bed without distress. Respiratory system:  Much improved breath sounds.  Essentially clear to auscultation except occasional basal crackles.  No wheezing or rhonchi.  No increased work of breathing. Cardiovascular system: S1 and S2 heard, RRR.  No  JVD, murmurs or pedal edema.  Gastrointestinal system: Abdomen is nondistended, soft and nontender. No organomegaly or masses felt. Normal bowel sounds heard. Central nervous system: Alert and oriented. No focal neurological deficits. Extremities: Symmetric 5 x 5 power. Skin: No rashes, lesions or ulcers Psychiatry: Judgement and insight appear normal. Mood & affect appropriate.    The results of significant diagnostics from this hospitalization (including imaging, microbiology, ancillary and laboratory) are listed below for reference.     Microbiology: Recent Results (from the past 240 hour(s))  Blood Culture (routine x 2)  Status: None   Collection Time: 09/15/19  2:50 PM   Specimen: BLOOD  Result Value Ref Range Status   Specimen Description BLOOD LEFT ANTECUBITAL  Final   Special Requests   Final    BOTTLES DRAWN AEROBIC AND ANAEROBIC Blood Culture results may not be optimal due to an inadequate volume of blood received in culture bottles   Culture   Final    NO GROWTH 5 DAYS Performed at [facility], [address] [place], [place] [address]    Report Status 09/20/2019 FINAL  Final  Blood Culture (routine x 2)     Status: None   Collection Time: 09/15/19  2:51 PM   Specimen: BLOOD  Result Value Ref Range Status   Specimen Description BLOOD BLOOD RIGHT FOREARM  Final   Special Requests   Final    BOTTLES DRAWN AEROBIC AND ANAEROBIC Blood Culture adequate volume   Culture   Final    NO GROWTH 5 DAYS Performed at [facility], [place] [address], [place], [place] [address]    Report Status 09/20/2019 FINAL  Final     Labs: CBC: Recent Labs  Lab 09/17/19 0211 09/18/19 0645 09/19/19 0420 09/20/19 0349  WBC 4.9 5.7 5.0 6.0  NEUTROABS 4.2 5.2 4.1 5.5  HGB 11.7* 11.4* 11.5* 11.5*  HCT 34.4* 34.0* 33.7* 33.3*  MCV 93.5 94.7 94.1 92.8  PLT 207 276 277 957    Basic Metabolic Panel: Recent Labs  Lab 09/17/19 0211 09/18/19 0645 09/19/19 0420  09/20/19 0349 09/21/19 0225  NA 136 138 136 136  --   K 4.1 4.2 4.5 4.5  --   CL 104 106 106 103  --   CO2 21* 21* 20* 23  --   GLUCOSE 285* 258* 291* 298*  --   BUN _0 27*  --   CREATININE 0.99 0.92 0.98 0.96  --   CALCIUM 8.7* 8.6* 8.5* 8.9  --   MG 1.8 1.7 1.7 1.6* 2.0  PHOS 2.3* 3.6 2.7 2.9  --     Liver Function Tests: Recent Labs  Lab 09/17/19 0211 09/18/19 0645 09/19/19 0420 09/20/19 0349  AST 41 _1 ALT _2 ALKPHOS 61 56 57 60  BILITOT 0.6 0.6 0.7 0.5  PROT 6.6 6.5 6.3* 6.4*  ALBUMIN 2.9* 3.0* 2.8* 2.8*    CBG: Recent Labs  Lab 09/22/19 1153 09/22/19 1633 09/22/19 2113 09/23/19 0821 09/23/19 1221  GLUCAP 170* 150* 326* 279* 273*    I discussed in detail with patient's daughter, updated care and answered questions.  Time coordinating discharge: 40 minutes  SIGNED:  Vernell Leep, MD, [place], [facility]. [facility]  To contact the attending provider between 7A-7P or the covering provider during after hours 7P-7A, please log into the web site [email or web address] and access using universal [place] password for that web site. If you do not have the password, please call the hospital operator.

## 2019-09-25 ENCOUNTER — Encounter: Payer: Self-pay | Admitting: Nurse Practitioner

## 2019-09-25 ENCOUNTER — Other Ambulatory Visit: Payer: Self-pay

## 2019-09-25 ENCOUNTER — Ambulatory Visit (INDEPENDENT_AMBULATORY_CARE_PROVIDER_SITE_OTHER): Payer: 59 | Admitting: Nurse Practitioner

## 2019-09-25 ENCOUNTER — Telehealth: Payer: Self-pay | Admitting: *Deleted

## 2019-09-25 VITALS — BP 90/54 | Temp 97.7°F | Ht 66.0 in | Wt 173.0 lb

## 2019-09-25 DIAGNOSIS — E119 Type 2 diabetes mellitus without complications: Secondary | ICD-10-CM

## 2019-09-25 DIAGNOSIS — I480 Paroxysmal atrial fibrillation: Secondary | ICD-10-CM

## 2019-09-25 DIAGNOSIS — J1282 Pneumonia due to coronavirus disease 2019: Secondary | ICD-10-CM

## 2019-09-25 DIAGNOSIS — E1122 Type 2 diabetes mellitus with diabetic chronic kidney disease: Secondary | ICD-10-CM

## 2019-09-25 DIAGNOSIS — U071 COVID-19: Secondary | ICD-10-CM

## 2019-09-25 DIAGNOSIS — I10 Essential (primary) hypertension: Secondary | ICD-10-CM

## 2019-09-25 DIAGNOSIS — M545 Low back pain, unspecified: Secondary | ICD-10-CM

## 2019-09-25 DIAGNOSIS — N183 Chronic kidney disease, stage 3 unspecified: Secondary | ICD-10-CM

## 2019-09-25 NOTE — Telephone Encounter (Signed)
Makayla Chandler, NP  Jeri Lager, CMA  Will need virtual visit [facility] please   Transition Care Management Follow-up Telephone Call  Date of discharge and from where: 09/23/2019 [place]  How have you been since you were released from the hospital? Slow but better  Any questions or concerns? No   Items Reviewed:  Did the pt receive and understand the discharge instructions provided? Yes   Medications obtained and verified? Yes   Any new allergies since your discharge? No   Dietary orders reviewed? Yes  Do you have support at home? Yes   Other (ie: DME, Home Health, etc) no  Functional Questionnaire: (I = Independent and D = Dependent) ADL's: D  Bathing/Dressing- D   Meal Prep- D  Eating- D  Maintaining continence- D  Transferring/Ambulation- D  Managing Meds- D   Follow up appointments reviewed:    PCP Hospital f/u appt confirmed? Yes  Scheduled to see Janett Billow on 09/25/19 @ virtual.  [facility] f/u appt confirmed? No    Are transportation arrangements needed? No   If their condition worsens, is the pt aware to call  their PCP or go to the ED? Yes  Was the patient provided with contact information for the PCP's office or ED? Yes  Was the pt encouraged to call back with questions or concerns? Yes

## 2019-09-25 NOTE — Progress Notes (Signed)
This service is provided via telemedicine  Vitals were collected by patient at home.    Location of patient (ex: home, work):  Home  Patient consents to a telephone visit:  Yes  Location of the provider (ex: office, home):  [facility], Office   Name of any referring provider:  N/A  Names of all persons participating in the telemedicine service and their role in the encounter:  S.Chrae B/CMA, Sherrie Mustache, NP, and Patient   Time spent on call:  11 min with medical assistant      Careteam: Patient Care Team: Lauree Chandler, NP as PCP - General (Geriatric Medicine) Katherine Mantle, [place] (Optometry)  Advanced Directive information    Allergies  Allergen Reactions  . Hydrocodone Nausea And Vomiting  . Lisinopril     Sick on the stomach    Chief Complaint  Patient presents with  . Transitions Of Care    Follow-up after hosiptal admission 09/15/2019-09/23/2019 for positive covid and pneumonia. Patient c/o extreme fatigue. Patient states bloodsugar 244 about 1 hour after breakfast. Telehealth/Virtual.      HPI: Patient is a 69 y.o. female via virtual visit for hospital follow up. Pt with PMH of hypertension, type II DM, CAD, PAF on anticoagulation, TIA, breast cancer s/p lumpectomy with radiation and depression presented with 1 week history of productive cough leading to worsening of mid to low back pain, noted to be hypoxic at 86% on room air and COVID-19 positive. Admitted for acute respiratory failure with hypoxia due to Covid 19 and bacterial pneumonia. She was discharged from the hospital 2 days ago.  [name] continues to be extremely fatigued and using 3L nasal canal. Reports the shortness of breath is bad with any movement. She is aware when she moves around to sit down and do breathing exercises.   She was discharged on 5 days of decadron with albuterol inhaler PRN.  Needs to pick up zinc from pharmacy.   She reports she is not staying in bed all day,  able to get up and walk around.  Reports of low back pain on admission- this has improved. Tramadol has been effective.   Abnormal LFT- resolved during hospitalizaton.   AKI- improved with hydration during hospitalization. Eating and drinking well even though she has decreased appetite.   Hypertension- 90/54 today. Taking losartan, norvasc 5 mg and carvedilol.   DM- metformin stopped, on glipizide  2.5 mg daily (while on steroids) along with metformin daily. Blood sugar yesterday was 149, this morning 244 (after food)  Patient and her daughter have been advised that she should quarantine for 21 days  Review of Systems:  Review of Systems  Constitutional: Positive for malaise/fatigue. Negative for chills and fever.  Respiratory: Positive for cough, sputum production, shortness of breath and wheezing.   Cardiovascular: Negative for chest pain and leg swelling.  Gastrointestinal: Negative for abdominal pain, constipation, diarrhea and heartburn.  Genitourinary: Negative for dysuria, frequency and urgency.  Musculoskeletal: Negative for back pain, joint pain, myalgias and neck pain.  Neurological: Positive for weakness. Negative for dizziness.    Past Medical History:  Diagnosis Date  . Anxiety   . Arthritis    osteoarthritis  . Breast cancer ([place]) 2008   right, ER/PR -  . CHF (congestive heart failure) ([place])   . Depression   . Diabetes mellitus    TYPE 2  . Gastroparesis   . Hx of radiation therapy 04/04/07 to 05/20/07   right breast/6260 cGy  .  Hypercholesterolemia   . Hypertension   . Lumbago   . Malignant neoplasm of breast (female), unspecified site   . Nonspecific elevation of levels of transaminase or lactic acid dehydrogenase (LDH)   . Osteoarthrosis, unspecified whether generalized or localized, unspecified site   . Regional enteritis of unspecified site   . Restless legs syndrome (RLS)   . TIA (transient ischemic attack)   . Urinary frequency   . Uterine prolapse  without mention of vaginal wall prolapse   . Vitamin deficiency    Past Surgical History:  Procedure Laterality Date  . ABDOMINAL HYSTERECTOMY  1997   TAH/BSO, ENDOMETRIAL SARCOMA  . ARTHROPLASTY     left knee  . BREAST LUMPECTOMY Right 11/17/06   re-excision 01/13/07  . RADICAL HYSTERECTOMY  1997  . TOTAL KNEE ARTHROPLASTY Left 06/10/2011      . TOTAL KNEE ARTHROPLASTY Right 2006, 2009   Dr Para March  . TOTAL KNEE ARTHROPLASTY Left 2008   Dr Para March  . WRIST SURGERY     carpel tunnel   Social History:   reports that she quit smoking about 28 years ago. She has a 10.00 pack-year smoking history. She has never used smokeless tobacco. She reports that she does not drink alcohol or use drugs.  Family History  Problem Relation Age of Onset  . Diabetes Mother   . Diabetes Father   . Aneurysm Sister   . Arthritis Sister   . Diabetes Brother   . Heart Problems Brother   . Colon cancer Neg Hx     Medications: Patient's Medications  New Prescriptions   No medications on file  Previous Medications   ACCU-CHEK FASTCLIX LANCETS MISC    Check blood sugar once daily as directed E11.59   ALBUTEROL (VENTOLIN HFA) 108 (90 BASE) MCG/ACT INHALER    Inhale 2 puffs into the lungs every 6 (six) hours as needed for wheezing or shortness of breath.   AMLODIPINE (NORVASC) 10 MG TABLET    Take 0.5 tablets (5 mg total) by mouth daily.   APIXABAN (ELIQUIS) 5 MG TABS TABLET    Take 1 tablet (5 mg total) by mouth 2 (two) times daily.   ASCORBIC ACID (VITAMIN C) 500 MG TABLET    Take 1 tablet (500 mg total) by mouth daily.   ATORVASTATIN (LIPITOR) 40 MG TABLET    TAKE 1 TABLET DAILY FOR    CHOLESTEROL   BLOOD GLUCOSE MONITORING SUPPL (ACCU-CHEK AVIVA PLUS) W/DEVICE KIT    Check blood sugar once daily as directed E11.59  Acc-Chek guide   CARVEDILOL (COREG) 25 MG TABLET    Take 1 tablet (25 mg total) by mouth 2 (two) times daily with a meal.   DEXAMETHASONE (DECADRON) 6 MG TABLET    Take 1 tablet (6 mg total)  by mouth daily for 5 days.   FERROUS SULFATE (IRON) 325 (65 FE) MG TABS    Take by mouth daily.   GLIPIZIDE (GLUCOTROL) 5 MG TABLET    Take 0.5 tablets (2.5 mg total) by mouth daily before breakfast for 5 days.   GLUCOSE BLOOD (ACCU-CHEK GUIDE) TEST STRIP    Use as instructed Check blood sugar once daily as directed E11.59   GUAIFENESIN-DEXTROMETHORPHAN (ROBITUSSIN DM) 100-10 MG/5ML SYRUP    Take 10 mLs by mouth every 4 (four) hours as needed for cough.   LOSARTAN (COZAAR) 100 MG TABLET    TAKE 1 TABLET BY MOUTH DAILY TO CONTROL BLOOD PRESSURE.   MENTHOL, TOPICAL ANALGESIC, (BIOFREEZE) 4 %  GEL    Apply 3 oz topically 3 (three) times daily as needed. Apply to both knees for pain   METFORMIN (GLUCOPHAGE) 1000 MG TABLET    Take 1,000 mg by mouth daily with breakfast.   ROPINIROLE (REQUIP) 0.25 MG TABLET    Take 1 tablet (0.25 mg total) by mouth at bedtime. For restless legs   TRAMADOL (ULTRAM) 50 MG TABLET    Take 1 tablet (50 mg total) by mouth daily as needed (Pain).   ZINC SULFATE 220 (50 ZN) MG CAPSULE    Take 1 capsule (220 mg total) by mouth daily.  Modified Medications   No medications on file  Discontinued Medications   No medications on file    Physical Exam:  Vitals:   09/25/19 1336  BP: (!) 90/54  Temp: 97.7 F (36.5 C)  Weight: 173 lb (78.5 kg)  Height: '5\' 6"'$  (1.676 m)   Body mass index is 27.92 kg/m. Wt Readings from Last 3 Encounters:  09/25/19 173 lb (78.5 kg)  09/23/19 190 lb 0.6 oz (86.2 kg)  08/18/19 192 lb 3.2 oz (87.2 kg)    Labs reviewed: Basic Metabolic Panel: Recent Labs    09/18/19 0645 09/18/19 0645 09/19/19 0420 09/20/19 0349 09/21/19 0225  NA 138  --  136 136  --   K 4.2  --  4.5 4.5  --   CL 106  --  106 103  --   CO2 21*  --  20* 23  --   GLUCOSE 258*  --  291* 298*  --   BUN 22  --  23 27*  --   CREATININE 0.92  --  0.98 0.96  --   CALCIUM 8.6*  --  8.5* 8.9  --   MG 1.7   < > 1.7 1.6* 2.0  PHOS 3.6  --  2.7 2.9  --    < > = values in  this interval not displayed.   Liver Function Tests: Recent Labs    09/18/19 0645 09/19/19 0420 09/20/19 0349  AST '30 30 23  '$ ALT '26 26 24  '$ ALKPHOS 56 57 60  BILITOT 0.6 0.7 0.5  PROT 6.5 6.3* 6.4*  ALBUMIN 3.0* 2.8* 2.8*   No results for input(s): LIPASE, AMYLASE in the last 8760 hours. No results for input(s): AMMONIA in the last 8760 hours. CBC: Recent Labs    09/18/19 0645 09/19/19 0420 09/20/19 0349  WBC 5.7 5.0 6.0  NEUTROABS 5.2 4.1 5.5  HGB 11.4* 11.5* 11.5*  HCT 34.0* 33.7* 33.3*  MCV 94.7 94.1 92.8  PLT 276 277 318   Lipid Panel: Recent Labs    11/04/18 0921 05/05/19 1006 09/15/19 1450  CHOL 189 200*  --   HDL 92 88  --   LDLCALC 84 99  --   TRIG 52 52 96  CHOLHDL 2.1 2.3  --    TSH: No results for input(s): TSH in the last 8760 hours. A1C: Lab Results  Component Value Date   HGBA1C 7.0 (H) 09/16/2019     Assessment/Plan 1. Pneumonia due to COVID-19 virus -at home at this time on 3L o2. Still exteremly fatigue and week but getting out of bed and eating and drinking. Reports she is getting her exercise by going to the bathroom during the day. Daughter picking the zinc up at the pharmacy today -continues on 5 days of decadron and using albuterol PRN.  2. AF (paroxysmal atrial fibrillation) (HCC) Rate controlled. Continue on carvedilol and eliquis  for anticoagulation.   3. CKD stage 3 due to type 2 diabetes mellitus ([place]) AKI improved during hospitalization. Encourage proper hydration and to avoid NSAIDS (Aleve, Advil, Motrin, Ibuprofen)   4. Type 2 diabetes mellitus without complication, without long-term current use of insulin (HCC) Blood sugars elevated since on decadron. Continues on glipizide 2.5 mg while on decadron. Will continue to monitor blood sugars and notify if remain elevated.   5. htn On the low side at home. She was restarted on losartan, norvasc and carediolol. She is monitoring daily and may need to stop norvasc.   6. Low  back pain Overall this has improved. Suspect related to COVID pneumonia and/or cough.  Next appt: 10/13/2019 as scheduled  Jim Lundin K. Harle Battiest  [facility] [phone]   Virtual Visit via Video Note  I connected with [name] on 09/25/19 at  1:30 PM EST by a video enabled telemedicine application and verified that I am speaking with the correct person using two identifiers.  Location: Patient: home Provider: office   I discussed the limitations of evaluation and management by telemedicine and the availability of in person appointments. The patient expressed understanding and agreed to proceed.    I discussed the assessment and treatment plan with the patient. The patient was provided an opportunity to ask questions and all were answered. The patient agreed with the plan and demonstrated an understanding of the instructions.   The patient was advised to call back or seek an in-person evaluation if the symptoms worsen or if the condition fails to improve as anticipated.  I provided 30 minutes of non-face-to-face time during this encounter.  Carlos American. Dewaine Oats, AGNP Avs printed and mailed.

## 2019-10-03 ENCOUNTER — Telehealth: Payer: Self-pay | Admitting: *Deleted

## 2019-10-03 NOTE — Telephone Encounter (Signed)
Patient notified and agreed. Patient stated that blood sugar has been coming down since stopping the Steroid.

## 2019-10-03 NOTE — Telephone Encounter (Signed)
No she was to stop this when done with her steroids-- hopefully the blood sugar has come down now that her steroids have stopped

## 2019-10-03 NOTE — Telephone Encounter (Signed)
Patient called and wanted to know if you want her to continue on the Glipizide and if so, the dosage and directions and refill. Please Advise.

## 2019-10-04 ENCOUNTER — Other Ambulatory Visit: Payer: Self-pay | Admitting: *Deleted

## 2019-10-04 MED ORDER — METFORMIN HCL 1000 MG PO TABS: 1000.0000 mg | ORAL_TABLET | Freq: Every day | ORAL | 1 refills | Status: DC

## 2019-10-04 NOTE — Telephone Encounter (Signed)
Received refill Request from [facility] requesting refill on Metformin. Medication pended and sent to [facility] for approval.   Reviewed last OV note 09/25/19:  DM- metformin stopped, on glipizide  2.5 mg daily (while on steroids) along with metformin daily. Blood sugar yesterday was 149, this morning 244 (after food)

## 2019-10-05 ENCOUNTER — Telehealth: Payer: Self-pay | Admitting: *Deleted

## 2019-10-05 NOTE — Telephone Encounter (Signed)
Received fax from [place] [facility]  for [place]. Placed in [place] folder to review, fill out and sign.  [employer] [id number] Fax: [phone] Claim #: [id number] Policy #: [id number]

## 2019-10-06 ENCOUNTER — Encounter (INDEPENDENT_AMBULATORY_CARE_PROVIDER_SITE_OTHER): Payer: Self-pay

## 2019-10-10 ENCOUNTER — Telehealth: Payer: Self-pay | Admitting: *Deleted

## 2019-10-10 NOTE — Telephone Encounter (Signed)
Patient dropped off FMLA Paperwork to be filled out from [employer].   [place] [phone] Fax: [phone] Employee ID: [id number] Leave Case #: [id number]  Placed paperwork in [place] folder to review,fill out and sign. To be faxed back once completed.

## 2019-10-13 ENCOUNTER — Ambulatory Visit (INDEPENDENT_AMBULATORY_CARE_PROVIDER_SITE_OTHER): Payer: 59 | Admitting: Nurse Practitioner

## 2019-10-13 ENCOUNTER — Other Ambulatory Visit: Payer: Self-pay

## 2019-10-13 ENCOUNTER — Encounter: Payer: Self-pay | Admitting: Nurse Practitioner

## 2019-10-13 ENCOUNTER — Telehealth: Payer: Self-pay | Admitting: *Deleted

## 2019-10-13 ENCOUNTER — Ambulatory Visit
Admission: RE | Admit: 2019-10-13 | Discharge: 2019-10-13 | Disposition: A | Discharge status: Home / Self Care | Payer: 59 | Source: Ambulatory Visit | Attending: Nurse Practitioner | Admitting: Nurse Practitioner

## 2019-10-13 VITALS — BP 130/78 | HR 67 | Temp 97.3°F | Ht 66.0 in | Wt 179.0 lb

## 2019-10-13 DIAGNOSIS — I10 Essential (primary) hypertension: Secondary | ICD-10-CM

## 2019-10-13 DIAGNOSIS — E1122 Type 2 diabetes mellitus with diabetic chronic kidney disease: Secondary | ICD-10-CM

## 2019-10-13 DIAGNOSIS — M1990 Unspecified osteoarthritis, unspecified site: Secondary | ICD-10-CM

## 2019-10-13 DIAGNOSIS — E119 Type 2 diabetes mellitus without complications: Secondary | ICD-10-CM

## 2019-10-13 DIAGNOSIS — U071 COVID-19: Secondary | ICD-10-CM | POA: Diagnosis not present

## 2019-10-13 DIAGNOSIS — G479 Sleep disorder, unspecified: Secondary | ICD-10-CM

## 2019-10-13 DIAGNOSIS — J1282 Pneumonia due to coronavirus disease 2019: Secondary | ICD-10-CM

## 2019-10-13 DIAGNOSIS — J9601 Acute respiratory failure with hypoxia: Secondary | ICD-10-CM | POA: Diagnosis not present

## 2019-10-13 DIAGNOSIS — N183 Chronic kidney disease, stage 3 unspecified: Secondary | ICD-10-CM

## 2019-10-13 DIAGNOSIS — D638 Anemia in other chronic diseases classified elsewhere: Secondary | ICD-10-CM

## 2019-10-13 DIAGNOSIS — I429 Cardiomyopathy, unspecified: Secondary | ICD-10-CM | POA: Diagnosis not present

## 2019-10-13 DIAGNOSIS — G2581 Restless legs syndrome: Secondary | ICD-10-CM

## 2019-10-13 DIAGNOSIS — I480 Paroxysmal atrial fibrillation: Secondary | ICD-10-CM

## 2019-10-13 DIAGNOSIS — Z029 Encounter for administrative examinations, unspecified: Secondary | ICD-10-CM

## 2019-10-13 MED ORDER — ROPINIROLE HCL 0.25 MG PO TABS: 0.2500 mg | ORAL_TABLET | Freq: Every day | ORAL | 1 refills | Status: DC

## 2019-10-13 NOTE — Progress Notes (Signed)
Careteam: Patient Care Team: Makayla Chandler, NP as PCP - General (Geriatric Medicine) Katherine Mantle, [place] (Optometry)  Advanced Directive information Does Patient Have a Medical Advance Directive?: No, Would patient like information on creating a medical advance directive?: No - Patient declined  Allergies  Allergen Reactions  . Hydrocodone Nausea And Vomiting  . Lisinopril     Sick on the stomach    Chief Complaint  Patient presents with  . Medical Management of Chronic Issues    6 month follow-up and f/u after Covid 19 diagnosis   . Medication Refill    Refill requip for 90 days supply at mail order pharmacy   . Quality Metric Gaps    Patient plans to schedule eye exam in the near future.      HPI: Patient is a 69 y.o. female for routine follow up.   She is slowly improving from COVID.  She weaned herself off of oxygen. Has not needed for 3 nights. Continues to try to keep moving but remains very fatigue and short of breath with minimal activity.  She wanted to sit down when she came into the office, it was hard for her to stand up to register.   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 A lot of ppl are telling her it is time to retire but she is not ready. Trying to follow all of the rules. Brother passed away from [place].   Continues to be on zinc, vit d, and vit c  Not sleeping well at night- does work nights so this effects.   RLS- controlled on requip.   DM- blood sugar improved off steroid. Yesterday 128    Review of Systems:  Review of Systems  Constitutional: Positive for malaise/fatigue. Negative for chills, fever and weight loss.  Respiratory: Positive for cough and shortness of breath. Negative for wheezing.   Cardiovascular: Negative for chest pain and palpitations.       Chest tightness with deep breath in  Gastrointestinal: Negative for abdominal pain, constipation and diarrhea.  Musculoskeletal:  Negative for joint pain and myalgias.  Neurological: Positive for weakness and headaches (occasionally). Negative for dizziness.  Psychiatric/Behavioral: Negative for depression. The patient is not nervous/anxious and does not have insomnia.     Past Medical History:  Diagnosis Date  . Anxiety   . Arthritis    osteoarthritis  . Breast cancer ([place]) 2008   right, ER/PR -  . CHF (congestive heart failure) ([place])   . Depression   . Diabetes mellitus    TYPE 2  . Gastroparesis   . Hx of radiation therapy 04/04/07 to 05/20/07   right breast/6260 cGy  . Hypercholesterolemia   . Hypertension   . Lumbago   . Malignant neoplasm of breast (female), unspecified site   . Nonspecific elevation of levels of transaminase or lactic acid dehydrogenase (LDH)   . Osteoarthrosis, unspecified whether generalized or localized, unspecified site   . Regional enteritis of unspecified site   . Restless legs syndrome (RLS)   . TIA (transient ischemic attack)   . Urinary frequency   . Uterine prolapse without mention of vaginal wall prolapse   . Vitamin deficiency    Past Surgical History:  Procedure Laterality Date  . ABDOMINAL HYSTERECTOMY  1997   TAH/BSO, ENDOMETRIAL SARCOMA  . ARTHROPLASTY     left knee  . BREAST LUMPECTOMY Right 11/17/06   re-excision 01/13/07  . CATARACT EXTRACTION Right 08/2019  .  CATARACT EXTRACTION Left 07/2019  . RADICAL HYSTERECTOMY  1997  . TOTAL KNEE ARTHROPLASTY Left 06/10/2011      . TOTAL KNEE ARTHROPLASTY Right 2006, 2009   Dr Para March  . TOTAL KNEE ARTHROPLASTY Left 2008   Dr Para March  . WRIST SURGERY     carpel tunnel   Social History:   reports that she quit smoking about 28 years ago. She has a 10.00 pack-year smoking history. She has never used smokeless tobacco. She reports that she does not drink alcohol or use drugs.  Family History  Problem Relation Age of Onset  . Diabetes Mother   . Diabetes Father   . Aneurysm Sister   . Arthritis Sister   . Diabetes  Brother   . Heart Problems Brother   . Colon cancer Neg Hx     Medications: Patient's Medications  New Prescriptions   No medications on file  Previous Medications   ACCU-CHEK FASTCLIX LANCETS MISC    Check blood sugar once daily as directed E11.59   ALBUTEROL (VENTOLIN HFA) 108 (90 BASE) MCG/ACT INHALER    Inhale 2 puffs into the lungs every 6 (six) hours as needed for wheezing or shortness of breath.   AMLODIPINE (NORVASC) 10 MG TABLET    Take 0.5 tablets (5 mg total) by mouth daily.   APIXABAN (ELIQUIS) 5 MG TABS TABLET    Take 1 tablet (5 mg total) by mouth 2 (two) times daily.   ASCORBIC ACID (VITAMIN C) 500 MG TABLET    Take 1 tablet (500 mg total) by mouth daily.   ATORVASTATIN (LIPITOR) 40 MG TABLET    TAKE 1 TABLET DAILY FOR    CHOLESTEROL   BLOOD GLUCOSE MONITORING SUPPL (ACCU-CHEK AVIVA PLUS) W/DEVICE KIT    Check blood sugar once daily as directed E11.59  Acc-Chek guide   CARVEDILOL (COREG) 25 MG TABLET    Take 1 tablet (25 mg total) by mouth 2 (two) times daily with a meal.   FERROUS SULFATE (IRON) 325 (65 FE) MG TABS    Take by mouth daily.   GLUCOSE BLOOD (ACCU-CHEK GUIDE) TEST STRIP    Use as instructed Check blood sugar once daily as directed E11.59   GUAIFENESIN-DEXTROMETHORPHAN (ROBITUSSIN DM) 100-10 MG/5ML SYRUP    Take 10 mLs by mouth every 4 (four) hours as needed for cough.   LOSARTAN (COZAAR) 100 MG TABLET    TAKE 1 TABLET BY MOUTH DAILY TO CONTROL BLOOD PRESSURE.   MENTHOL, TOPICAL ANALGESIC, (BIOFREEZE) 4 % GEL    Apply 3 oz topically 3 (three) times daily as needed. Apply to both knees for pain   METFORMIN (GLUCOPHAGE) 1000 MG TABLET    Take 1 tablet (1,000 mg total) by mouth daily with breakfast.   ROPINIROLE (REQUIP) 0.25 MG TABLET    Take 1 tablet (0.25 mg total) by mouth at bedtime. For restless legs   TRAMADOL (ULTRAM) 50 MG TABLET    Take 1 tablet (50 mg total) by mouth daily as needed (Pain).   ZINC SULFATE 220 (50 ZN) MG CAPSULE    Take 1 capsule (220 mg  total) by mouth daily.  Modified Medications   No medications on file  Discontinued Medications   GLIPIZIDE (GLUCOTROL) 5 MG TABLET    Take 0.5 tablets (2.5 mg total) by mouth daily before breakfast for 5 days.    Physical Exam:  Vitals:   10/13/19 0831  BP: 130/78  Pulse: 67  Temp: (!) 97.3 F (36.3 C)  TempSrc:  Temporal  SpO2: 98%  Weight: 179 lb (81.2 kg)  Height: _0  (1.676 m)   Body mass index is 28.89 kg/m. Wt Readings from Last 3 Encounters:  10/13/19 179 lb (81.2 kg)  09/25/19 173 lb (78.5 kg)  09/23/19 190 lb 0.6 oz (86.2 kg)    Physical Exam Constitutional:      General: She is not in acute distress.    Appearance: She is well-developed. She is not diaphoretic.  HENT:     Head: Normocephalic and atraumatic.     Mouth/Throat:     Pharynx: No oropharyngeal exudate.  Eyes:     Conjunctiva/sclera: Conjunctivae normal.     Pupils: Pupils are equal, round, and reactive to light.  Cardiovascular:     Rate and Rhythm: Normal rate and regular rhythm.     Heart sounds: Normal heart sounds.  Pulmonary:     Effort: Pulmonary effort is normal.     Breath sounds: Rhonchi (in bases R>L) present.  Abdominal:     General: Bowel sounds are normal.     Palpations: Abdomen is soft.  Musculoskeletal:        General: No tenderness.     Cervical back: Normal range of motion and neck supple.  Skin:    General: Skin is warm and dry.  Neurological:     Mental Status: She is alert and oriented to person, place, and time.     Gait: Gait abnormal (slow, cautious).  Psychiatric:        Mood and Affect: Mood normal.     Labs reviewed: Basic Metabolic Panel: Recent Labs    09/18/19 0645 09/18/19 0645 09/19/19 0420 09/20/19 0349 09/21/19 0225  NA 138  --  136 136  --   K 4.2  --  4.5 4.5  --   CL 106  --  106 103  --   CO2 21*  --  20* 23  --   GLUCOSE 258*  --  291* 298*  --   BUN 22  --  23 27*  --   CREATININE 0.92  --  0.98 0.96  --   CALCIUM 8.6*  --  8.5*  8.9  --   MG 1.7   < > 1.7 1.6* 2.0  PHOS 3.6  --  2.7 2.9  --    < > = values in this interval not displayed.   Liver Function Tests: Recent Labs    09/18/19 0645 09/19/19 0420 09/20/19 0349  AST _1 ALT _2 ALKPHOS 56 57 60  BILITOT 0.6 0.7 0.5  PROT 6.5 6.3* 6.4*  ALBUMIN 3.0* 2.8* 2.8*   No results for input(s): LIPASE, AMYLASE in the last 8760 hours. No results for input(s): AMMONIA in the last 8760 hours. CBC: Recent Labs    09/18/19 0645 09/19/19 0420 09/20/19 0349  WBC 5.7 5.0 6.0  NEUTROABS 5.2 4.1 5.5  HGB 11.4* 11.5* 11.5*  HCT 34.0* 33.7* 33.3*  MCV 94.7 94.1 92.8  PLT 276 277 318   Lipid Panel: Recent Labs    11/04/18 0921 05/05/19 1006 09/15/19 1450  CHOL 189 200*  --   HDL 92 88  --   LDLCALC 84 99  --   TRIG 52 52 96  CHOLHDL 2.1 2.3  --    TSH: No results for input(s): TSH in the last 8760 hours. A1C: Lab Results  Component Value Date   HGBA1C 7.0 (H) 09/16/2019     Assessment/Plan  1. Restless leg syndrome -controlled on requip, refill requested - rOPINIRole (REQUIP) 0.25 MG tablet; Take 1 tablet (0.25 mg total) by mouth at bedtime. For restless legs  Dispense: 90 tablet; Refill: 1  2. Pneumonia due to COVID-19 virus Ongoing shortness of breath with activity but has been able to wean off oxygen at this time. However some abnormal lung sounds noted in the bases bilaterally, not able to clear with cough, will follow up Chest xray and CBC at this time.  Forms completed for FMLA and short term disability and given to pt - DG Chest 2 View; Future  3. AF (paroxysmal atrial fibrillation) (HCC) -rate controlled, no palpitations at this time.  - CBC with Differential/Platelet  4. CKD stage 3 due to type 2 diabetes mellitus ([place]) - will follow up renal function. Encourage proper hydration and to avoid NSAIDS (Aleve, Advil, Motrin, Ibuprofen)   5. Type 2 diabetes mellitus without complication, without long-term current use of  insulin (HCC) -blood sugars have improved since coming off steroid, she will continue to monitor and notify if fasting blood sugars reg over 125   6. Essential hypertension -stable on current regimen - COMPLETE METABOLIC PANEL WITH GFR  7. Osteoarthritis, unspecified osteoarthritis type, unspecified site Stable at this time. Not needing tramadol due to not working - COMPLETE METABOLIC PANEL WITH GFR  8. Anemia, chronic disease -will follow up at this time. - CBC with Differential/Platelet  9. Cardiomyopathy, unspecified type ([place]) Hx of cardiomyopathy, followed by cardiologist, due to recent COVID diagnosis recommended follow up if she is having a prolonged recovery time.  10. Acute respiratory failure with hypoxia ([place]) -has weaned from O2 at night at this time but continues to have shortness of breath with minimal activity. O2 checked in office and remained stable.   11. Difficulty sleeping She has worked night shift her entire life so having trouble sleeping at night, encouraged melatonin 3 mg daily at bedtime.   Next appt: 3 months, labs At appt Felisa Zechman K. [place], [facility] [phone]

## 2019-10-13 NOTE — Telephone Encounter (Signed)
Patient called and stated that she wants her Oxygen removed from her home. Stated that she does not need it anymore.   It was supplied by [facility] [phone]. They will not remove it without an order from the PCP.  Please Advise.

## 2019-10-13 NOTE — Patient Instructions (Addendum)
Melatonin 3 mg by mouth daily for sleep  Recommend making follow up with with cardiologist due to your medical hx with recent COVID pneumonia.   Follow up in 3 months, labs prior to appt.

## 2019-10-13 NOTE — Telephone Encounter (Signed)
Will await blood work and make sure her shortness of breath improves before we remove it, she may need it again and not have it.

## 2019-10-14 LAB — COMPLETE METABOLIC PANEL WITH GFR
AG Ratio: 1.4 (calc) (ref 1.0–2.5)
ALT: 21 U/L (ref 6–29)
AST: 18 U/L (ref 10–35)
Albumin: 3.6 g/dL (ref 3.6–5.1)
Alkaline phosphatase (APISO): 84 U/L (ref 37–153)
BUN/Creatinine Ratio: 16 (calc) (ref 6–22)
BUN: 17 mg/dL (ref 7–25)
CO2: 26 mmol/L (ref 20–32)
Calcium: 9.3 mg/dL (ref 8.6–10.4)
Chloride: 101 mmol/L (ref 98–110)
Creat: 1.06 mg/dL — ABNORMAL HIGH (ref 0.60–0.93)
GFR, Est African American: 62 mL/min/{1.73_m2} (ref >=60)
GFR, Est Non African American: 53 mL/min/{1.73_m2} — ABNORMAL LOW (ref >=60)
Globulin: 2.6 g/dL (calc) (ref 1.9–3.7)
Glucose, Bld: 295 mg/dL — ABNORMAL HIGH (ref 65–99)
Potassium: 5.1 mmol/L (ref 3.5–5.3)
Sodium: 136 mmol/L (ref 135–146)
Total Bilirubin: 0.5 mg/dL (ref 0.2–1.2)
Total Protein: 6.2 g/dL (ref 6.1–8.1)

## 2019-10-14 LAB — CBC WITH DIFFERENTIAL/PLATELET
Absolute Monocytes: 337 cells/uL (ref 200–950)
Basophils Absolute: 0 cells/uL (ref 0–200)
Basophils Relative: 0 %
Eosinophils Absolute: 71 cells/uL (ref 15–500)
Eosinophils Relative: 2.1 %
HCT: 34.4 % — ABNORMAL LOW (ref 35.0–45.0)
Hemoglobin: 11.8 g/dL (ref 11.7–15.5)
Lymphs Abs: 904 cells/uL (ref 850–3900)
MCH: 32.2 pg (ref 27.0–33.0)
MCHC: 34.3 g/dL (ref 32.0–36.0)
MCV: 94 fL (ref 80.0–100.0)
MPV: 10.6 fL (ref 7.5–12.5)
Monocytes Relative: 9.9 %
Neutro Abs: 2088 cells/uL (ref 1500–7800)
Neutrophils Relative %: 61.4 %
Platelets: 234 10*3/uL (ref 140–400)
RBC: 3.66 10*6/uL — ABNORMAL LOW (ref 3.80–5.10)
RDW: 13.8 % (ref 11.0–15.0)
Total Lymphocyte: 26.6 %
WBC: 3.4 10*3/uL — ABNORMAL LOW (ref 3.8–10.8)

## 2019-10-16 ENCOUNTER — Telehealth: Payer: Self-pay | Admitting: Internal Medicine

## 2019-10-16 NOTE — Telephone Encounter (Signed)
New message:    Patient went to primary care and told her she might need to come in and see Dr. Caryl Comes. Patient was in the hospital from 09/15/19 to 09/23/19. Please call patient back.

## 2019-10-16 NOTE — Telephone Encounter (Signed)
Spoke with pt who states she was hospitalized in January with covid.  Pt is calling as her PCP suggested she might want to f/u with Dr Caryl Comes.  Pt reports she has not had any problems with her heart rhythm that she is aware of and is feeling "good" except for the fatigue she is experiencing since having covid.  Pt reports B/P and O2 sats have been good as well.  Offered pt anl appointment with Dr Caryl Comes.  Pt states she does not feel that is needed right now but will call if she needs to be seen.  Pt will continue her f/u with her PCP and is aware she will receive a recall to schedule appointment with Dr Caryl Comes in July.  Pt verbalizes understanding and agrees with current plan.

## 2019-11-08 ENCOUNTER — Ambulatory Visit (INDEPENDENT_AMBULATORY_CARE_PROVIDER_SITE_OTHER): Payer: 59 | Admitting: Nurse Practitioner

## 2019-11-08 ENCOUNTER — Other Ambulatory Visit: Payer: Self-pay

## 2019-11-08 ENCOUNTER — Encounter: Payer: Self-pay | Admitting: Nurse Practitioner

## 2019-11-08 VITALS — BP 140/82 | HR 71 | Temp 96.8°F | Ht 66.0 in | Wt 193.6 lb

## 2019-11-08 DIAGNOSIS — G9331 Postviral fatigue syndrome: Secondary | ICD-10-CM

## 2019-11-08 DIAGNOSIS — G933 Postviral fatigue syndrome: Secondary | ICD-10-CM | POA: Diagnosis not present

## 2019-11-08 DIAGNOSIS — Z8616 Personal history of COVID-19: Secondary | ICD-10-CM | POA: Diagnosis not present

## 2019-11-08 NOTE — Patient Instructions (Signed)
Follow up on 11/24/2019 via virtual visit prior to returning to work.

## 2019-11-08 NOTE — Progress Notes (Signed)
Careteam: Patient Care Team: Lauree Chandler, NP as PCP - General (Geriatric Medicine) Katherine Mantle, [place] (Optometry)  Advanced Directive information    Allergies  Allergen Reactions  . Hydrocodone Nausea And Vomiting  . Lisinopril     Sick on the stomach    Chief Complaint  Patient presents with  . Letter for School/Work    Letter request to return to work. Patient with ongoing fatigue and SOB      HPI: Patient is a 69 y.o. female for COVID follow up.  Wants to return to work but very fatigue from walking from the waiting room to the exam room. Pushes herself everyday to get stronger and do more but easily fatigue and malaise  Reports shortness of breath with minimal activity Plans to start riding her bike at home  Brother and her went walking and it was hard but she pushed her self.  There is no light duty at work. 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  She will have to go back to work part time and start off going in about 4 hours.   Review of Systems:  Review of Systems  Constitutional: Positive for malaise/fatigue. Negative for chills, fever and weight loss.  HENT: Negative for tinnitus.   Respiratory: Positive for shortness of breath (with activity). Negative for cough and sputum production.   Cardiovascular: Negative for chest pain, palpitations and leg swelling.  Gastrointestinal: Negative for abdominal pain, constipation, diarrhea and heartburn.  Genitourinary: Negative for dysuria, frequency and urgency.  Musculoskeletal: Negative for back pain, falls, joint pain and myalgias.  Skin: Negative.   Neurological: Negative for dizziness and headaches.  Psychiatric/Behavioral: Negative for depression. The patient does not have insomnia.     Past Medical History:  Diagnosis Date  . Anxiety   . Arthritis    osteoarthritis  . Breast cancer ([place]) 2008   right, ER/PR -  . CHF (congestive heart failure) ([place])   . Depression   . Diabetes  mellitus    TYPE 2  . Gastroparesis   . Hx of radiation therapy 04/04/07 to 05/20/07   right breast/6260 cGy  . Hypercholesterolemia   . Hypertension   . Lumbago   . Malignant neoplasm of breast (female), unspecified site   . Nonspecific elevation of levels of transaminase or lactic acid dehydrogenase (LDH)   . Osteoarthrosis, unspecified whether generalized or localized, unspecified site   . Regional enteritis of unspecified site   . Restless legs syndrome (RLS)   . TIA (transient ischemic attack)   . Urinary frequency   . Uterine prolapse without mention of vaginal wall prolapse   . Vitamin deficiency    Past Surgical History:  Procedure Laterality Date  . ABDOMINAL HYSTERECTOMY  1997   TAH/BSO, ENDOMETRIAL SARCOMA  . ARTHROPLASTY     left knee  . BREAST LUMPECTOMY Right 11/17/06   re-excision 01/13/07  . CATARACT EXTRACTION Right 08/2019  . CATARACT EXTRACTION Left 07/2019  . RADICAL HYSTERECTOMY  1997  . TOTAL KNEE ARTHROPLASTY Left 06/10/2011      . TOTAL KNEE ARTHROPLASTY Right 2006, 2009   Dr Para March  . TOTAL KNEE ARTHROPLASTY Left 2008   Dr Para March  . WRIST SURGERY     carpel tunnel   Social History:   reports that she quit smoking about 28 years ago. She has a 10.00 pack-year smoking history. She has never used smokeless tobacco. She reports that she does not drink alcohol or use drugs.  Family History  Problem Relation Age of Onset  . Diabetes Mother   . Diabetes Father   . Aneurysm Sister   . Arthritis Sister   . Diabetes Brother   . Heart Problems Brother   . Colon cancer Neg Hx     Medications: Patient's Medications  New Prescriptions   No medications on file  Previous Medications   ACCU-CHEK FASTCLIX LANCETS MISC    Check blood sugar once daily as directed E11.59   ALBUTEROL (VENTOLIN HFA) 108 (90 BASE) MCG/ACT INHALER    Inhale 2 puffs into the lungs every 6 (six) hours as needed for wheezing or shortness of breath.   AMLODIPINE (NORVASC) 10 MG TABLET     Take 0.5 tablets (5 mg total) by mouth daily.   APIXABAN (ELIQUIS) 5 MG TABS TABLET    Take 1 tablet (5 mg total) by mouth 2 (two) times daily.   ATORVASTATIN (LIPITOR) 40 MG TABLET    TAKE 1 TABLET DAILY FOR    CHOLESTEROL   BLOOD GLUCOSE MONITORING SUPPL (ACCU-CHEK AVIVA PLUS) W/DEVICE KIT    Check blood sugar once daily as directed E11.59  Acc-Chek guide   CARVEDILOL (COREG) 25 MG TABLET    Take 1 tablet (25 mg total) by mouth 2 (two) times daily with a meal.   FERROUS SULFATE (IRON) 325 (65 FE) MG TABS    Take by mouth daily.   GLUCOSE BLOOD (ACCU-CHEK GUIDE) TEST STRIP    Use as instructed Check blood sugar once daily as directed E11.59   LOSARTAN (COZAAR) 100 MG TABLET    TAKE 1 TABLET BY MOUTH DAILY TO CONTROL BLOOD PRESSURE.   MENTHOL, TOPICAL ANALGESIC, (BIOFREEZE) 4 % GEL    Apply 3 oz topically 3 (three) times daily as needed. Apply to both knees for pain   METFORMIN (GLUCOPHAGE) 1000 MG TABLET    Take 1 tablet (1,000 mg total) by mouth daily with breakfast.   ROPINIROLE (REQUIP) 0.25 MG TABLET    Take 1 tablet (0.25 mg total) by mouth at bedtime. For restless legs  Modified Medications   No medications on file  Discontinued Medications   ASCORBIC ACID (VITAMIN C) 500 MG TABLET    Take 1 tablet (500 mg total) by mouth daily.   GUAIFENESIN-DEXTROMETHORPHAN (ROBITUSSIN DM) 100-10 MG/5ML SYRUP    Take 10 mLs by mouth every 4 (four) hours as needed for cough.   ZINC SULFATE 220 (50 ZN) MG CAPSULE    Take 1 capsule (220 mg total) by mouth daily.    Physical Exam:  Vitals:   11/08/19 1053  BP: 140/82  Pulse: 71  Temp: (!) 96.8 F (36 C)  TempSrc: Temporal  SpO2: 97%  Weight: 193 lb 9.6 oz (87.8 kg)  Height: 5' 6" (1.676 m)   Body mass index is 31.25 kg/m. Wt Readings from Last 3 Encounters:  11/08/19 193 lb 9.6 oz (87.8 kg)  10/13/19 179 lb (81.2 kg)  09/25/19 173 lb (78.5 kg)    Physical Exam Constitutional:      General: She is not in acute distress.     Appearance: She is well-developed. She is not diaphoretic.  HENT:     Head: Normocephalic and atraumatic.     Mouth/Throat:     Pharynx: No oropharyngeal exudate.  Eyes:     Conjunctiva/sclera: Conjunctivae normal.     Pupils: Pupils are equal, round, and reactive to light.  Cardiovascular:     Rate and Rhythm: Normal rate and regular rhythm.  Heart sounds: Normal heart sounds.  Pulmonary:     Effort: Pulmonary effort is normal.     Breath sounds: Normal breath sounds.  Abdominal:     General: Bowel sounds are normal.     Palpations: Abdomen is soft.  Musculoskeletal:        General: No tenderness.     Cervical back: Normal range of motion and neck supple.  Skin:    General: Skin is warm and dry.  Neurological:     Mental Status: She is alert and oriented to person, place, and time.     Labs reviewed: Basic Metabolic Panel: Recent Labs    09/18/19 0645 09/18/19 0645 09/19/19 0420 09/20/19 0349 09/21/19 0225 10/13/19 0901  NA 138   < > 136 136  --  136  K 4.2   < > 4.5 4.5  --  5.1  CL 106   < > 106 103  --  101  CO2 21*   < > 20* 23  --  26  GLUCOSE 258*   < > 291* 298*  --  295*  BUN 22   < > 23 27*  --  17  CREATININE 0.92   < > 0.98 0.96  --  1.06*  CALCIUM 8.6*   < > 8.5* 8.9  --  9.3  MG 1.7   < > 1.7 1.6* 2.0  --   PHOS 3.6  --  2.7 2.9  --   --    < > = values in this interval not displayed.   Liver Function Tests: Recent Labs    09/18/19 0645 09/18/19 0645 09/19/19 0420 09/20/19 0349 10/13/19 0901  AST 30   < > 30 23 18  ALT 26   < > 26 24 21  ALKPHOS 56  --  57 60  --   BILITOT 0.6   < > 0.7 0.5 0.5  PROT 6.5   < > 6.3* 6.4* 6.2  ALBUMIN 3.0*  --  2.8* 2.8*  --    < > = values in this interval not displayed.   No results for input(s): LIPASE, AMYLASE in the last 8760 hours. No results for input(s): AMMONIA in the last 8760 hours. CBC: Recent Labs    09/19/19 0420 09/20/19 0349 10/13/19 0901  WBC 5.0 6.0 3.4*  NEUTROABS 4.1 5.5  2,088  HGB 11.5* 11.5* 11.8  HCT 33.7* 33.3* 34.4*  MCV 94.1 92.8 94.0  PLT 277 318 234   Lipid Panel: Recent Labs    05/05/19 1006 09/15/19 1450  CHOL 200*  --   HDL 88  --   LDLCALC 99  --   TRIG 52 96  CHOLHDL 2.3  --    TSH: No results for input(s): TSH in the last 8760 hours. A1C: Lab Results  Component Value Date   HGBA1C 7.0 (H) 09/16/2019     Assessment/Plan 1. Personal history of covid-19 No longer requiring o2 but still with fatigue and malaise and breathlessness. Getting better daily but still has lack on endurance.   2. Post viral syndrome Attempting to gain strength and endurance daily but still left with fatigue and breathlessness post COVID. She is improving and wants to go back to work but there is no "light duty" Plans to start back with limited hours when she goes. Trying to be more active at home.  Based on what she is doing now and increasing activity daily she hopes she can maintain at work in ~2 weeks.   Work note provided.  Next appt: 2 weeks.  Carlos American. [place], [facility] [phone]

## 2019-11-10 ENCOUNTER — Other Ambulatory Visit: Payer: Self-pay | Admitting: Nurse Practitioner

## 2019-11-10 DIAGNOSIS — M1990 Unspecified osteoarthritis, unspecified site: Secondary | ICD-10-CM

## 2019-11-15 ENCOUNTER — Other Ambulatory Visit: Payer: Self-pay | Admitting: Nurse Practitioner

## 2019-11-22 ENCOUNTER — Telehealth: Payer: Self-pay

## 2019-11-22 ENCOUNTER — Encounter: Payer: Self-pay | Admitting: Nurse Practitioner

## 2019-11-22 ENCOUNTER — Telehealth: Payer: Self-pay | Admitting: Nurse Practitioner

## 2019-11-22 ENCOUNTER — Ambulatory Visit (INDEPENDENT_AMBULATORY_CARE_PROVIDER_SITE_OTHER): Payer: 59 | Admitting: Nurse Practitioner

## 2019-11-22 ENCOUNTER — Other Ambulatory Visit: Payer: Self-pay

## 2019-11-22 VITALS — BP 122/74 | HR 66 | Temp 96.8°F | Ht 66.0 in | Wt 191.2 lb

## 2019-11-22 DIAGNOSIS — G9331 Postviral fatigue syndrome: Secondary | ICD-10-CM

## 2019-11-22 DIAGNOSIS — Z8616 Personal history of COVID-19: Secondary | ICD-10-CM

## 2019-11-22 DIAGNOSIS — G933 Postviral fatigue syndrome: Secondary | ICD-10-CM | POA: Diagnosis not present

## 2019-11-22 NOTE — Progress Notes (Signed)
Careteam: Patient Care Team: Lauree Chandler, NP as PCP - General (Geriatric Medicine) Katherine Mantle, [place] (Optometry)  PLACE OF SERVICE:  [facility]  Advanced Directive information    Allergies  Allergen Reactions  . Hydrocodone Nausea And Vomiting  . Lisinopril     Sick on the stomach    Chief Complaint  Patient presents with  . Follow-up    2 week follow-up on post viral syndrome   . Medication Management    Discuss albuterol inhaler   . Immunizations    Advise when patient can receive covid-19 vaccine   . Quality Metric Gaps    Discuss need for eye exam      HPI: Patient is a 69 y.o. female to follow up. She is planning on return to work next week. Feeling much better and getting better daily. Increasing her activity.  Breathing as improved. More active. Trying to get up the hill was hard but she made it Trying to walk 1 mile every morning on the treadmill, getting easier and going faster.   Blood sugars are back to normal. Had had thorough eye exam this year. [facility]  Continues with post viral cough. Continues with "cold"   Review of Systems:  Review of Systems  Constitutional: Negative for chills, fever, malaise/fatigue and weight loss.  Respiratory: Positive for cough, sputum production and shortness of breath. Negative for wheezing.        Cough and shortness of breath improved overall  Cardiovascular: Negative for chest pain, palpitations and leg swelling.  Musculoskeletal: Negative for myalgias.  Neurological: Negative for dizziness and headaches.    Past Medical History:  Diagnosis Date  . Anxiety   . Arthritis    osteoarthritis  . Breast cancer ([place]) 2008   right, ER/PR -  . CHF (congestive heart failure) ([place])   . Depression   . Diabetes mellitus    TYPE 2  . Gastroparesis   . Hx of radiation therapy 04/04/07 to 05/20/07   right breast/6260 cGy  . Hypercholesterolemia   . Hypertension   . Lumbago   .  Malignant neoplasm of breast (female), unspecified site   . Nonspecific elevation of levels of transaminase or lactic acid dehydrogenase (LDH)   . Osteoarthrosis, unspecified whether generalized or localized, unspecified site   . Regional enteritis of unspecified site   . Restless legs syndrome (RLS)   . TIA (transient ischemic attack)   . Urinary frequency   . Uterine prolapse without mention of vaginal wall prolapse   . Vitamin deficiency    Past Surgical History:  Procedure Laterality Date  . ABDOMINAL HYSTERECTOMY  1997   TAH/BSO, ENDOMETRIAL SARCOMA  . ARTHROPLASTY     left knee  . BREAST LUMPECTOMY Right 11/17/06   re-excision 01/13/07  . CATARACT EXTRACTION Right 08/2019  . CATARACT EXTRACTION Left 07/2019  . RADICAL HYSTERECTOMY  1997  . TOTAL KNEE ARTHROPLASTY Left 06/10/2011      . TOTAL KNEE ARTHROPLASTY Right 2006, 2009   Dr Para March  . TOTAL KNEE ARTHROPLASTY Left 2008   Dr Para March  . WRIST SURGERY     carpel tunnel   Social History:   reports that she quit smoking about 28 years ago. She has a 10.00 pack-year smoking history. She has never used smokeless tobacco. She reports that she does not drink alcohol or use drugs.  Family History  Problem Relation Age of Onset  . Diabetes Mother   .  Diabetes Father   . Aneurysm Sister   . Arthritis Sister   . Diabetes Brother   . Heart Problems Brother   . Colon cancer Neg Hx     Medications: Patient's Medications  New Prescriptions   No medications on file  Previous Medications   ACCU-CHEK FASTCLIX LANCETS MISC    Check blood sugar once daily as directed E11.59   ALBUTEROL (VENTOLIN HFA) 108 (90 BASE) MCG/ACT INHALER    Inhale 2 puffs into the lungs every 6 (six) hours as needed for wheezing or shortness of breath.   AMLODIPINE (NORVASC) 10 MG TABLET    TAKE 1/2 TABLET ('5MG'$  TOTAL)DAILY   APIXABAN (ELIQUIS) 5 MG TABS TABLET    Take 1 tablet (5 mg total) by mouth 2 (two) times daily.   ATORVASTATIN (LIPITOR) 40 MG  TABLET    TAKE 1 TABLET DAILY FOR    CHOLESTEROL   BLOOD GLUCOSE MONITORING SUPPL (ACCU-CHEK AVIVA PLUS) W/DEVICE KIT    Check blood sugar once daily as directed E11.59  Acc-Chek guide   CARVEDILOL (COREG) 25 MG TABLET    Take 1 tablet (25 mg total) by mouth 2 (two) times daily with a meal.   FERROUS SULFATE (IRON) 325 (65 FE) MG TABS    Take by mouth daily.   GLUCOSE BLOOD (ACCU-CHEK GUIDE) TEST STRIP    Use as instructed Check blood sugar once daily as directed E11.59   LOSARTAN (COZAAR) 100 MG TABLET    TAKE 1 TABLET BY MOUTH DAILY TO CONTROL BLOOD PRESSURE.   MENTHOL, TOPICAL ANALGESIC, (BIOFREEZE) 4 % GEL    Apply 3 oz topically 3 (three) times daily as needed. Apply to both knees for pain   METFORMIN (GLUCOPHAGE) 1000 MG TABLET    Take 1 tablet (1,000 mg total) by mouth daily with breakfast.   ROPINIROLE (REQUIP) 0.25 MG TABLET    Take 1 tablet (0.25 mg total) by mouth at bedtime. For restless legs   TRAMADOL (ULTRAM) 50 MG TABLET    Take 1 tablet (50 mg total) by mouth daily as needed.  Modified Medications   No medications on file  Discontinued Medications   No medications on file    Physical Exam:  Vitals:   11/22/19 0831  BP: 122/74  Pulse: 66  Temp: (!) 96.8 F (36 C)  TempSrc: Temporal  SpO2: 99%  Weight: 191 lb 3.2 oz (86.7 kg)  Height: '5\' 6"'$  (1.676 m)   Body mass index is 30.86 kg/m. Wt Readings from Last 3 Encounters:  11/22/19 191 lb 3.2 oz (86.7 kg)  11/08/19 193 lb 9.6 oz (87.8 kg)  10/13/19 179 lb (81.2 kg)    Physical Exam Constitutional:      General: She is not in acute distress.    Appearance: She is well-developed. She is not diaphoretic.  HENT:     Head: Normocephalic and atraumatic.     Mouth/Throat:     Pharynx: No oropharyngeal exudate.  Eyes:     Conjunctiva/sclera: Conjunctivae normal.     Pupils: Pupils are equal, round, and reactive to light.  Cardiovascular:     Rate and Rhythm: Normal rate and regular rhythm.     Heart sounds: Normal  heart sounds.  Pulmonary:     Effort: Pulmonary effort is normal.     Breath sounds: Normal breath sounds.  Abdominal:     General: Bowel sounds are normal.     Palpations: Abdomen is soft.  Musculoskeletal:  General: No tenderness.     Cervical back: Normal range of motion and neck supple.     Right lower leg: No edema.     Left lower leg: No edema.  Skin:    General: Skin is warm and dry.  Neurological:     Mental Status: She is alert and oriented to person, place, and time.  Psychiatric:        Mood and Affect: Mood normal.        Behavior: Behavior normal.     Labs reviewed: Basic Metabolic Panel: Recent Labs    09/18/19 0645 09/18/19 0645 09/19/19 0420 09/20/19 0349 09/21/19 0225 10/13/19 0901  NA 138   < > 136 136  --  136  K 4.2   < > 4.5 4.5  --  5.1  CL 106   < > 106 103  --  101  CO2 21*   < > 20* 23  --  26  GLUCOSE 258*   < > 291* 298*  --  295*  BUN 22   < > 23 27*  --  17  CREATININE 0.92   < > 0.98 0.96  --  1.06*  CALCIUM 8.6*   < > 8.5* 8.9  --  9.3  MG 1.7   < > 1.7 1.6* 2.0  --   PHOS 3.6  --  2.7 2.9  --   --    < > = values in this interval not displayed.   Liver Function Tests: Recent Labs    09/18/19 0645 09/18/19 0645 09/19/19 0420 09/20/19 0349 10/13/19 0901  AST 30   < > '30 23 18  '$ ALT 26   < > '26 24 21  '$ ALKPHOS 56  --  57 60  --   BILITOT 0.6   < > 0.7 0.5 0.5  PROT 6.5   < > 6.3* 6.4* 6.2  ALBUMIN 3.0*  --  2.8* 2.8*  --    < > = values in this interval not displayed.   No results for input(s): LIPASE, AMYLASE in the last 8760 hours. No results for input(s): AMMONIA in the last 8760 hours. CBC: Recent Labs    09/19/19 0420 09/20/19 0349 10/13/19 0901  WBC 5.0 6.0 3.4*  NEUTROABS 4.1 5.5 2,088  HGB 11.5* 11.5* 11.8  HCT 33.7* 33.3* 34.4*  MCV 94.1 92.8 94.0  PLT 277 318 234   Lipid Panel: Recent Labs    05/05/19 1006 09/15/19 1450  CHOL 200*  --   HDL 88  --   LDLCALC 99  --   TRIG 52 96  CHOLHDL 2.3   --    TSH: No results for input(s): TSH in the last 8760 hours. A1C: Lab Results  Component Value Date   HGBA1C 7.0 (H) 09/16/2019     Assessment/Plan 1. Post viral syndrome -breathlessness has slowly improved she continues with cough and some mucus production. Encouraged to use mucinex by mouth twice daily with full glass of water as needed for mucous production.  Continue to slowly increase activity- she is doing so well with this and making major improvements.  -she feels like she is good to go back to work for limited hours as work her way to up to full shifts.  2. Personal history of covid-19 -continues with post viral symptoms. -okay to get vaccine at this time.   Next appt: in May as schedule. Will complete most at time of visit.  Carlos American. Harle Battiest  [facility] [phone]

## 2019-11-22 NOTE — Telephone Encounter (Signed)
Left detailed message on voicemail at [facility] requesting last eye exam.  Office was currently closed (opens at 10:00 am)  Awaiting fax

## 2019-11-22 NOTE — Telephone Encounter (Signed)
Completed and given to Suzette at front to call pt and let her know it is ready.

## 2019-11-22 NOTE — Telephone Encounter (Signed)
Patient states that she was seen by Janett Billow this morning and received a note to return to work as tolerated. She states that per her HR manager the notes needs to state the exact time she can work.  Per patient another note is needed and she states to put on the note that she can work 3-4 hours.  She believes that she can work those hours.  Please fax note to Hart Robinsons [phone]) or he can be reached by phone at [phone].    Thank you  Adan Sis

## 2020-01-07 ENCOUNTER — Other Ambulatory Visit: Payer: Self-pay | Admitting: Nurse Practitioner

## 2020-01-08 NOTE — Telephone Encounter (Signed)
rx sent to pharmacy by e-script  

## 2020-01-12 ENCOUNTER — Ambulatory Visit
Admission: RE | Admit: 2020-01-12 | Discharge: 2020-01-12 | Disposition: A | Discharge status: Home / Self Care | Payer: 59 | Source: Ambulatory Visit | Attending: Nurse Practitioner | Admitting: Nurse Practitioner

## 2020-01-12 ENCOUNTER — Ambulatory Visit (INDEPENDENT_AMBULATORY_CARE_PROVIDER_SITE_OTHER): Payer: 59 | Admitting: Nurse Practitioner

## 2020-01-12 ENCOUNTER — Encounter: Payer: Self-pay | Admitting: Nurse Practitioner

## 2020-01-12 ENCOUNTER — Other Ambulatory Visit: Payer: Self-pay

## 2020-01-12 VITALS — BP 142/90 | HR 67 | Temp 96.8°F | Ht 66.0 in | Wt 185.6 lb

## 2020-01-12 DIAGNOSIS — G933 Postviral fatigue syndrome: Secondary | ICD-10-CM

## 2020-01-12 DIAGNOSIS — E1122 Type 2 diabetes mellitus with diabetic chronic kidney disease: Secondary | ICD-10-CM

## 2020-01-12 DIAGNOSIS — E785 Hyperlipidemia, unspecified: Secondary | ICD-10-CM

## 2020-01-12 DIAGNOSIS — I480 Paroxysmal atrial fibrillation: Secondary | ICD-10-CM | POA: Diagnosis not present

## 2020-01-12 DIAGNOSIS — I10 Essential (primary) hypertension: Secondary | ICD-10-CM | POA: Diagnosis not present

## 2020-01-12 DIAGNOSIS — N1831 Chronic kidney disease, stage 3a: Secondary | ICD-10-CM

## 2020-01-12 DIAGNOSIS — E1169 Type 2 diabetes mellitus with other specified complication: Secondary | ICD-10-CM

## 2020-01-12 DIAGNOSIS — G9331 Postviral fatigue syndrome: Secondary | ICD-10-CM

## 2020-01-12 DIAGNOSIS — D638 Anemia in other chronic diseases classified elsewhere: Secondary | ICD-10-CM

## 2020-01-12 DIAGNOSIS — E1121 Type 2 diabetes mellitus with diabetic nephropathy: Secondary | ICD-10-CM

## 2020-01-12 DIAGNOSIS — N183 Chronic kidney disease, stage 3 unspecified: Secondary | ICD-10-CM

## 2020-01-12 NOTE — Progress Notes (Signed)
Careteam: Patient Care Team: Lauree Chandler, NP as PCP - General (Geriatric Medicine) Katherine Mantle, [place] (Optometry)  PLACE OF SERVICE:  [place] Directive information Does Patient Have a Medical Advance Directive?: No, Would patient like information on creating a medical advance directive?: Yes (ED - Information included in AVS)(Patient has paperwork)  Allergies  Allergen Reactions  . Hydrocodone Nausea And Vomiting  . Lisinopril     Sick on the stomach    Chief Complaint  Patient presents with  . Medical Management of Chronic Issues    3 month follow up. Fasting for labs.  . Quality Metric Gaps    Discuss need for eye exam     HPI: Patient is a 69 y.o. female for routine follow up.   Reports she thinks she is drinking too much water- 2 bottles when she gets home and then throughout the time she is at work.  Continues with mild congestion and mucous. Now at work working 8-9 hours. She does have a hard time with stairs. No shortness of breath noted.   Plans to get COVID shot- plans to go next week.   htn- has not taken medication this morning, 101/60 sometimes.   Anemia- continues on iron.   A fib- continues on eliquis, no signs of bruising or bleeding. Continues on coreg for rate control. No palpitations noted.  DM- blood sugar this morning 92. Up to date on eye exam. No hypoglycemic episodes noted.    Review of Systems:  Review of Systems  Constitutional: Negative for chills, fever and weight loss.  HENT: Negative for tinnitus.   Respiratory: Positive for cough and sputum production. Negative for shortness of breath.   Cardiovascular: Positive for leg swelling (when she has been on her feet all night at work. ). Negative for chest pain and palpitations.  Gastrointestinal: Negative for abdominal pain, constipation, diarrhea and heartburn.  Genitourinary: Negative for dysuria, frequency and urgency.  Musculoskeletal: Negative for back pain,  falls, joint pain and myalgias.  Skin: Negative.   Neurological: Negative for dizziness and headaches.  Psychiatric/Behavioral: Negative for depression and memory loss. The patient does not have insomnia.     Past Medical History:  Diagnosis Date  . Anxiety   . Arthritis    osteoarthritis  . Breast cancer ([place]) 2008   right, ER/PR -  . CHF (congestive heart failure) ([place])   . Depression   . Diabetes mellitus    TYPE 2  . Gastroparesis   . Hx of radiation therapy 04/04/07 to 05/20/07   right breast/6260 cGy  . Hypercholesterolemia   . Hypertension   . Lumbago   . Malignant neoplasm of breast (female), unspecified site   . Nonspecific elevation of levels of transaminase or lactic acid dehydrogenase (LDH)   . Osteoarthrosis, unspecified whether generalized or localized, unspecified site   . Regional enteritis of unspecified site   . Restless legs syndrome (RLS)   . TIA (transient ischemic attack)   . Urinary frequency   . Uterine prolapse without mention of vaginal wall prolapse   . Vitamin deficiency    Past Surgical History:  Procedure Laterality Date  . ABDOMINAL HYSTERECTOMY  1997   TAH/BSO, ENDOMETRIAL SARCOMA  . ARTHROPLASTY     left knee  . BREAST LUMPECTOMY Right 11/17/06   re-excision 01/13/07  . CATARACT EXTRACTION Right 08/2019  . CATARACT EXTRACTION Left 07/2019  . RADICAL HYSTERECTOMY  1997  . TOTAL KNEE ARTHROPLASTY Left 06/10/2011      .  TOTAL KNEE ARTHROPLASTY Right 2006, 2009   Dr Para March  . TOTAL KNEE ARTHROPLASTY Left 2008   Dr Para March  . WRIST SURGERY     carpel tunnel   Social History:   reports that she quit smoking about 28 years ago. She has a 10.00 pack-year smoking history. She has never used smokeless tobacco. She reports that she does not drink alcohol or use drugs.  Family History  Problem Relation Age of Onset  . Diabetes Mother   . Diabetes Father   . Aneurysm Sister   . Arthritis Sister   . Diabetes Brother   . Heart Problems Brother    . Colon cancer Neg Hx     Medications: Patient's Medications  New Prescriptions   No medications on file  Previous Medications   ACCU-CHEK FASTCLIX LANCETS MISC    Check blood sugar once daily as directed E11.59   ALBUTEROL (VENTOLIN HFA) 108 (90 BASE) MCG/ACT INHALER    Inhale 2 puffs into the lungs every 6 (six) hours as needed for wheezing or shortness of breath.   AMLODIPINE (NORVASC) 10 MG TABLET    TAKE 1/2 TABLET ('5MG'$  TOTAL)DAILY   APIXABAN (ELIQUIS) 5 MG TABS TABLET    Take 1 tablet (5 mg total) by mouth 2 (two) times daily.   ATORVASTATIN (LIPITOR) 40 MG TABLET    TAKE 1 TABLET DAILY FOR    CHOLESTEROL   BLOOD GLUCOSE MONITORING SUPPL (ACCU-CHEK AVIVA PLUS) W/DEVICE KIT    Check blood sugar once daily as directed E11.59  Acc-Chek guide   CARVEDILOL (COREG) 25 MG TABLET    TAKE 1 TABLET TWICE DAILY  WITH MEALS   FERROUS SULFATE (IRON) 325 (65 FE) MG TABS    Take by mouth daily.   GLUCOSE BLOOD (ACCU-CHEK GUIDE) TEST STRIP    Use as instructed Check blood sugar once daily as directed E11.59   LOSARTAN (COZAAR) 100 MG TABLET    TAKE 1 TABLET BY MOUTH DAILY TO CONTROL BLOOD PRESSURE.   MENTHOL, TOPICAL ANALGESIC, (BIOFREEZE) 4 % GEL    Apply 3 oz topically 3 (three) times daily as needed. Apply to both knees for pain   METFORMIN (GLUCOPHAGE) 1000 MG TABLET    Take 1 tablet (1,000 mg total) by mouth daily with breakfast.   ROPINIROLE (REQUIP) 0.25 MG TABLET    Take 1 tablet (0.25 mg total) by mouth at bedtime. For restless legs   TRAMADOL (ULTRAM) 50 MG TABLET    Take 1 tablet (50 mg total) by mouth daily as needed.  Modified Medications   No medications on file  Discontinued Medications   No medications on file    Physical Exam:  Vitals:   01/12/20 0816  BP: (!) 142/90  Pulse: 67  Temp: (!) 96.8 F (36 C)  TempSrc: Temporal  SpO2: 99%  Weight: 185 lb 9.6 oz (84.2 kg)  Height: '5\' 6"'$  (1.676 m)   Body mass index is 29.96 kg/m. Wt Readings from Last 3 Encounters:    01/12/20 185 lb 9.6 oz (84.2 kg)  11/22/19 191 lb 3.2 oz (86.7 kg)  11/08/19 193 lb 9.6 oz (87.8 kg)    Physical Exam Constitutional:      General: She is not in acute distress.    Appearance: She is well-developed. She is not diaphoretic.  HENT:     Head: Normocephalic and atraumatic.     Mouth/Throat:     Pharynx: No oropharyngeal exudate.  Eyes:     Conjunctiva/sclera: Conjunctivae normal.  Pupils: Pupils are equal, round, and reactive to light.  Cardiovascular:     Rate and Rhythm: Normal rate and regular rhythm.     Heart sounds: Normal heart sounds.  Pulmonary:     Effort: Pulmonary effort is normal.     Breath sounds: Normal breath sounds.  Abdominal:     General: Bowel sounds are normal.     Palpations: Abdomen is soft.  Musculoskeletal:        General: No tenderness.     Cervical back: Normal range of motion and neck supple.     Right lower leg: Edema (trace) present.     Left lower leg: Edema (trace) present.  Skin:    General: Skin is warm and dry.  Neurological:     Mental Status: She is alert and oriented to person, place, and time.  Psychiatric:        Mood and Affect: Mood normal.        Behavior: Behavior normal.     Labs reviewed: Basic Metabolic Panel: Recent Labs    09/18/19 0645 09/18/19 0645 09/19/19 0420 09/20/19 0349 09/21/19 0225 10/13/19 0901  NA 138   < > 136 136  --  136  K 4.2   < > 4.5 4.5  --  5.1  CL 106   < > 106 103  --  101  CO2 21*   < > 20* 23  --  26  GLUCOSE 258*   < > 291* 298*  --  295*  BUN 22   < > 23 27*  --  17  CREATININE 0.92   < > 0.98 0.96  --  1.06*  CALCIUM 8.6*   < > 8.5* 8.9  --  9.3  MG 1.7   < > 1.7 1.6* 2.0  --   PHOS 3.6  --  2.7 2.9  --   --    < > = values in this interval not displayed.   Liver Function Tests: Recent Labs    09/18/19 0645 09/18/19 0645 09/19/19 0420 09/20/19 0349 10/13/19 0901  AST 30   < > _0 ALT 26   < > _1 ALKPHOS 56  --  57 60  --   BILITOT 0.6    < > 0.7 0.5 0.5  PROT 6.5   < > 6.3* 6.4* 6.2  ALBUMIN 3.0*  --  2.8* 2.8*  --    < > = values in this interval not displayed.   No results for input(s): LIPASE, AMYLASE in the last 8760 hours. No results for input(s): AMMONIA in the last 8760 hours. CBC: Recent Labs    09/19/19 0420 09/20/19 0349 10/13/19 0901  WBC 5.0 6.0 3.4*  NEUTROABS 4.1 5.5 2,088  HGB 11.5* 11.5* 11.8  HCT 33.7* 33.3* 34.4*  MCV 94.1 92.8 94.0  PLT 277 318 234   Lipid Panel: Recent Labs    05/05/19 1006 09/15/19 1450  CHOL 200*  --   HDL 88  --   LDLCALC 99  --   TRIG 52 96  CHOLHDL 2.3  --    TSH: No results for input(s): TSH in the last 8760 hours. A1C: Lab Results  Component Value Date   HGBA1C 7.0 (H) 09/16/2019     Assessment/Plan 1. CKD stage 3 due to type 2 diabetes mellitus ([place]) -Encourage proper hydration and to avoid NSAIDS (Aleve, Advil, Motrin, Ibuprofen) -due for labs today.   2. AF (paroxysmal atrial  fibrillation) ([facility]) - stable, rate controlled, continues on coreg for rate with eliquis 5 mg BID for anticoagulation.  - COMPLETE METABOLIC PANEL WITH GFR  3. Essential hypertension -well controlled generally, she has had not medication today, home sbp generally in the 100s - COMPLETE METABOLIC PANEL WITH GFR  4. Anemia, chronic disease -continues on iron supplement. - CBC with Differential/Platelet  5. Type 2 diabetes mellitus with stage 3a chronic kidney disease, without long-term current use of insulin ([facility]) -continues on metformin 1000 mg daily; Encouraged dietary compliance, routine foot care/monitoring and to keep up with diabetic eye exams through ophthalmology  - Hemoglobin A1c  6. Hyperlipidemia associated with type 2 diabetes mellitus ([place]) -fasting at this time for blood work. Continues on crestor 40 mg daily with dietary modifications.  - Lipid Panel  7. Post viral syndrome -ongoing cough and congestion s/p COVID. Will follow up chest xray to rule out  another underlying process or abnormal finding. Continues on mucinex DM which is beneficial.  -CBC - DG Chest 2 View; Future  Next appt: 6 months, sooner if needed  Teliah Buffalo K. [place], [facility] [phone]

## 2020-01-12 NOTE — Patient Instructions (Addendum)
mucinex DM tablet- by mouth twice daily for cough and congestion For MUCOUS only take the mucinex (plain)   Sign record release for ophthalmologist for diabetic eye exam   To get chest xray due to persistent cough

## 2020-01-13 LAB — COMPLETE METABOLIC PANEL WITH GFR
AG Ratio: 1.6 (calc) (ref 1.0–2.5)
ALT: 19 U/L (ref 6–29)
AST: 24 U/L (ref 10–35)
Albumin: 4.4 g/dL (ref 3.6–5.1)
Alkaline phosphatase (APISO): 88 U/L (ref 37–153)
BUN/Creatinine Ratio: 22 (calc) (ref 6–22)
BUN: 22 mg/dL (ref 7–25)
CO2: 24 mmol/L (ref 20–32)
Calcium: 9.6 mg/dL (ref 8.6–10.4)
Chloride: 108 mmol/L (ref 98–110)
Creat: 0.99 mg/dL — ABNORMAL HIGH (ref 0.60–0.93)
GFR, Est African American: 67 mL/min/{1.73_m2} (ref >=60)
GFR, Est Non African American: 58 mL/min/{1.73_m2} — ABNORMAL LOW (ref >=60)
Globulin: 2.7 g/dL (calc) (ref 1.9–3.7)
Glucose, Bld: 76 mg/dL (ref 65–99)
Potassium: 3.7 mmol/L (ref 3.5–5.3)
Sodium: 142 mmol/L (ref 135–146)
Total Bilirubin: 0.5 mg/dL (ref 0.2–1.2)
Total Protein: 7.1 g/dL (ref 6.1–8.1)

## 2020-01-13 LAB — CBC WITH DIFFERENTIAL/PLATELET
Absolute Monocytes: 396 cells/uL (ref 200–950)
Basophils Absolute: 9 cells/uL (ref 0–200)
Basophils Relative: 0.2 %
Eosinophils Absolute: 99 cells/uL (ref 15–500)
Eosinophils Relative: 2.3 %
HCT: 34.6 % — ABNORMAL LOW (ref 35.0–45.0)
Hemoglobin: 11.7 g/dL (ref 11.7–15.5)
Lymphs Abs: 1746 cells/uL (ref 850–3900)
MCH: 32.5 pg (ref 27.0–33.0)
MCHC: 33.8 g/dL (ref 32.0–36.0)
MCV: 96.1 fL (ref 80.0–100.0)
MPV: 10.4 fL (ref 7.5–12.5)
Monocytes Relative: 9.2 %
Neutro Abs: 2051 cells/uL (ref 1500–7800)
Neutrophils Relative %: 47.7 %
Platelets: 217 10*3/uL (ref 140–400)
RBC: 3.6 10*6/uL — ABNORMAL LOW (ref 3.80–5.10)
RDW: 12.6 % (ref 11.0–15.0)
Total Lymphocyte: 40.6 %
WBC: 4.3 10*3/uL (ref 3.8–10.8)

## 2020-01-13 LAB — LIPID PANEL
Cholesterol: 208 mg/dL — ABNORMAL HIGH (ref <200)
HDL: 94 mg/dL (ref >=50)
LDL Cholesterol (Calc): 99 mg/dL (calc)
Non-HDL Cholesterol (Calc): 114 mg/dL (calc) (ref <130)
Total CHOL/HDL Ratio: 2.2 (calc) (ref <5.0)
Triglycerides: 60 mg/dL (ref <150)

## 2020-01-13 LAB — HEMOGLOBIN A1C
Hgb A1c MFr Bld: 7.6 % of total Hgb — ABNORMAL HIGH (ref <5.7)
Mean Plasma Glucose: 171 (calc)
eAG (mmol/L): 9.5 (calc)

## 2020-01-15 ENCOUNTER — Other Ambulatory Visit: Payer: Self-pay

## 2020-01-15 DIAGNOSIS — E785 Hyperlipidemia, unspecified: Secondary | ICD-10-CM

## 2020-01-15 MED ORDER — ROSUVASTATIN CALCIUM 40 MG PO TABS: 40.0000 mg | ORAL_TABLET | Freq: Every day | ORAL | 1 refills | Status: DC

## 2020-01-15 MED ORDER — METFORMIN HCL 1000 MG PO TABS: 1000.0000 mg | ORAL_TABLET | Freq: Two times a day (BID) | ORAL | 0 refills | Status: DC

## 2020-01-25 ENCOUNTER — Other Ambulatory Visit: Payer: Self-pay | Admitting: Nurse Practitioner

## 2020-01-25 DIAGNOSIS — Z1231 Encounter for screening mammogram for malignant neoplasm of breast: Secondary | ICD-10-CM

## 2020-02-03 ENCOUNTER — Other Ambulatory Visit: Payer: Self-pay | Admitting: Nurse Practitioner

## 2020-02-03 DIAGNOSIS — M1990 Unspecified osteoarthritis, unspecified site: Secondary | ICD-10-CM

## 2020-02-06 NOTE — Telephone Encounter (Signed)
Patient last filled 11/10/19 - was given #30 tabs.

## 2020-03-01 ENCOUNTER — Other Ambulatory Visit: Payer: Self-pay

## 2020-03-01 ENCOUNTER — Ambulatory Visit
Admission: RE | Admit: 2020-03-01 | Discharge: 2020-03-01 | Disposition: A | Discharge status: Home / Self Care | Payer: 59 | Source: Ambulatory Visit | Attending: Nurse Practitioner | Admitting: Nurse Practitioner

## 2020-03-01 DIAGNOSIS — Z1231 Encounter for screening mammogram for malignant neoplasm of breast: Secondary | ICD-10-CM

## 2020-03-05 ENCOUNTER — Other Ambulatory Visit: Payer: Self-pay | Admitting: Nurse Practitioner

## 2020-03-12 ENCOUNTER — Telehealth: Payer: Self-pay

## 2020-03-12 NOTE — Telephone Encounter (Signed)
She has been getting 30 since her hospitalization in January 2021

## 2020-03-12 NOTE — Telephone Encounter (Signed)
Patient called to inquire about why Tramadol was last filled for 30 pills for she takes twice daily. Patient states no one discussed with her that medication instructions or dispense number would be changed.  Patient is asking for medication to be changed back to 1 by mouth two times daily with a dispense number of 60.  Send rx to [facility]

## 2020-03-12 NOTE — Telephone Encounter (Signed)
Patient scheduled appointment for tomorrow at 8:00 am to further discuss

## 2020-03-13 ENCOUNTER — Ambulatory Visit (INDEPENDENT_AMBULATORY_CARE_PROVIDER_SITE_OTHER): Payer: 59 | Admitting: Nurse Practitioner

## 2020-03-13 ENCOUNTER — Other Ambulatory Visit: Payer: Self-pay

## 2020-03-13 ENCOUNTER — Encounter: Payer: Self-pay | Admitting: Nurse Practitioner

## 2020-03-13 DIAGNOSIS — M1990 Unspecified osteoarthritis, unspecified site: Secondary | ICD-10-CM | POA: Diagnosis not present

## 2020-03-13 MED ORDER — TRAMADOL HCL 50 MG PO TABS: 50.0000 mg | ORAL_TABLET | Freq: Two times a day (BID) | ORAL | 0 refills | Status: DC | PRN

## 2020-03-13 NOTE — Patient Instructions (Addendum)
Use ice to knee when you work long hours  Try xra strength tylenol 500 mg 2 tablets every 8 hours as needed for pain  (can take tramadol after tylenol if not effective)

## 2020-03-13 NOTE — Progress Notes (Signed)
Careteam: Patient Care Team: Lauree Chandler, NP as PCP - General (Geriatric Medicine) Katherine Mantle, [place] (Optometry)  PLACE OF SERVICE:  [place] Directive information    Allergies  Allergen Reactions   Hydrocodone Nausea And Vomiting   Lisinopril     Sick on the stomach    Chief Complaint  Patient presents with   Acute Visit    Discuss medications.Wants to discuss Tramadol (the quanity).     HPI: Patient is a 69 y.o. female due to tramadol use.   Reports she is using tramadol up to 2 in 24 hours.  Will take 1 before she goes to sleep then 1 in the middle of the day if needed.  She will only use if she is working, otherwise tries to tolerate the pain. When she was in the hospital and in her room did not need as often.  Tylenol 625 mg every 8 hours as needed- does not really work.  Pain in hips and knees.  Reports she has had chest tenderness- went through a lot of test because she through her cancer had spread to the bone but was found to have arthritis.   She worked 14 hours earlier this week and needed 2 tramadol. Did not realize the instructions change.  She has been walking all night and knee is swollen. When she is sitting pain is fine but will have a hard time trying to get up out of the chair and walk. If she is trying to lay down and sleep pain is 8/10. Will take tramadol to 1-2 out of 10.  Has tried tylenol which has not been effective- feels like she is immune to it.  Review of Systems:  Review of Systems  Constitutional: Negative for chills, fever and weight loss.  Musculoskeletal: Positive for joint pain and myalgias.  Skin: Negative for itching and rash.  Neurological: Negative for dizziness and headaches.    Past Medical History:  Diagnosis Date   Anxiety    Arthritis    osteoarthritis   Breast cancer ([place]) 2008   right, ER/PR -   CHF (congestive heart failure) ([place])    Depression    Diabetes mellitus    TYPE 2    Gastroparesis    Hx of radiation therapy 04/04/07 to 05/20/07   right breast/6260 cGy   Hypercholesterolemia    Hypertension    Lumbago    Malignant neoplasm of breast (female), unspecified site    Nonspecific elevation of levels of transaminase or lactic acid dehydrogenase (LDH)    Osteoarthrosis, unspecified whether generalized or localized, unspecified site    Regional enteritis of unspecified site    Restless legs syndrome (RLS)    TIA (transient ischemic attack)    Urinary frequency    Uterine prolapse without mention of vaginal wall prolapse    Vitamin deficiency    Past Surgical History:  Procedure Laterality Date   ABDOMINAL HYSTERECTOMY  1997   TAH/BSO, ENDOMETRIAL SARCOMA   ARTHROPLASTY     left knee   BREAST LUMPECTOMY Right 11/17/06   re-excision 01/13/07   CATARACT EXTRACTION Right 08/2019   CATARACT EXTRACTION Left 07/2019   RADICAL HYSTERECTOMY  1997   TOTAL KNEE ARTHROPLASTY Left 06/10/2011       TOTAL KNEE ARTHROPLASTY Right 2006, 2009   Dr Para March   TOTAL KNEE ARTHROPLASTY Left 2008   Dr Para March   WRIST SURGERY     carpel tunnel   Social History:   reports  that she quit smoking about 28 years ago. She has a 10.00 pack-year smoking history. She has never used smokeless tobacco. She reports that she does not drink alcohol and does not use drugs.  Family History  Problem Relation Age of Onset   Diabetes Mother    Diabetes Father    Aneurysm Sister    Arthritis Sister    Diabetes Brother    Heart Problems Brother    Colon cancer Neg Hx     Medications: Patient's Medications  New Prescriptions   No medications on file  Previous Medications   ACCU-CHEK FASTCLIX LANCETS MISC    Check blood sugar once daily as directed E11.59   ACCU-CHEK GUIDE TEST STRIP    CHECK BLOOD SUGAR ONCE     DAILY AS DIRECTED.   ALBUTEROL (VENTOLIN HFA) 108 (90 BASE) MCG/ACT INHALER    Inhale 2 puffs into the lungs every 6 (six) hours as needed for  wheezing or shortness of breath.   AMLODIPINE (NORVASC) 10 MG TABLET    TAKE 1/2 TABLET ('5MG'$  TOTAL)DAILY   APIXABAN (ELIQUIS) 5 MG TABS TABLET    Take 1 tablet (5 mg total) by mouth 2 (two) times daily.   ATORVASTATIN (LIPITOR) 40 MG TABLET    Take 40 mg by mouth daily.   BLOOD GLUCOSE MONITORING SUPPL (ACCU-CHEK AVIVA PLUS) W/DEVICE KIT    Check blood sugar once daily as directed E11.59  Acc-Chek guide   CARVEDILOL (COREG) 25 MG TABLET    TAKE 1 TABLET TWICE DAILY  WITH MEALS   FERROUS SULFATE (IRON) 325 (65 FE) MG TABS    Take by mouth daily.   LOSARTAN (COZAAR) 100 MG TABLET    TAKE 1 TABLET BY MOUTH DAILY TO CONTROL BLOOD PRESSURE.   MENTHOL, TOPICAL ANALGESIC, (BIOFREEZE) 4 % GEL    Apply 3 oz topically 3 (three) times daily as needed. Apply to both knees for pain   METFORMIN (GLUCOPHAGE) 1000 MG TABLET    Take 1 tablet (1,000 mg total) by mouth 2 (two) times daily with a meal.   ROPINIROLE (REQUIP) 0.25 MG TABLET    Take 1 tablet (0.25 mg total) by mouth at bedtime. For restless legs   ROSUVASTATIN (CRESTOR) 40 MG TABLET    Take 1 tablet (40 mg total) by mouth daily.   TRAMADOL (ULTRAM) 50 MG TABLET    TAKE 1 TABLET BY MOUTH DAILY AS NEEDED.  Modified Medications   No medications on file  Discontinued Medications   No medications on file    Physical Exam:  Vitals:   03/13/20 0824  BP: 120/80  Pulse: 65  Temp: (!) 97.2 F (36.2 C)  SpO2: 99%  Weight: 187 lb 12.8 oz (85.2 kg)  Height: '5\' 6"'$  (1.676 m)   Body mass index is 30.31 kg/m. Wt Readings from Last 3 Encounters:  01/12/20 185 lb 9.6 oz (84.2 kg)  11/22/19 191 lb 3.2 oz (86.7 kg)  11/08/19 193 lb 9.6 oz (87.8 kg)    Physical Exam Constitutional:      Appearance: Normal appearance.  Musculoskeletal:     Right knee: Swelling present.     Left knee: Swelling present.     Right lower leg: Edema present.     Left lower leg: Edema present.  Skin:    General: Skin is warm and dry.  Neurological:     General: No  focal deficit present.     Mental Status: She is alert and oriented to person, place, and  time.     Motor: No weakness.     Gait: Gait abnormal (slow).  Psychiatric:        Mood and Affect: Mood normal.        Behavior: Behavior normal.     Labs reviewed: Basic Metabolic Panel: Recent Labs    09/18/19 0645 09/18/19 0645 09/19/19 0420 09/19/19 0420 09/20/19 0349 09/21/19 0225 10/13/19 0901 01/12/20 0854  NA 138   < > 136   < > 136  --  136 142  K 4.2   < > 4.5   < > 4.5  --  5.1 3.7  CL 106   < > 106   < > 103  --  101 108  CO2 21*   < > 20*   < > 23  --  26 24  GLUCOSE 258*   < > 291*   < > 298*  --  295* 76  BUN 22   < > 23   < > 27*  --  17 22  CREATININE 0.92   < > 0.98   < > 0.96  --  1.06* 0.99*  CALCIUM 8.6*   < > 8.5*   < > 8.9  --  9.3 9.6  MG 1.7   < > 1.7  --  1.6* 2.0  --   --   PHOS 3.6  --  2.7  --  2.9  --   --   --    < > = values in this interval not displayed.   Liver Function Tests: Recent Labs    09/18/19 0645 09/18/19 0645 09/19/19 0420 09/19/19 0420 09/20/19 0349 10/13/19 0901 01/12/20 0854  AST 30   < > 30   < > '23 18 24  '$ ALT 26   < > 26   < > '24 21 19  '$ ALKPHOS 56  --  57  --  60  --   --   BILITOT 0.6   < > 0.7   < > 0.5 0.5 0.5  PROT 6.5   < > 6.3*   < > 6.4* 6.2 7.1  ALBUMIN 3.0*  --  2.8*  --  2.8*  --   --    < > = values in this interval not displayed.   No results for input(s): LIPASE, AMYLASE in the last 8760 hours. No results for input(s): AMMONIA in the last 8760 hours. CBC: Recent Labs    09/20/19 0349 10/13/19 0901 01/12/20 0854  WBC 6.0 3.4* 4.3  NEUTROABS 5.5 2,088 2,051  HGB 11.5* 11.8 11.7  HCT 33.3* 34.4* 34.6*  MCV 92.8 94.0 96.1  PLT 318 234 217   Lipid Panel: Recent Labs    05/05/19 1006 09/15/19 1450 01/12/20 0854  CHOL 200*  --  208*  HDL 88  --  94  LDLCALC 99  --  99  TRIG 52 96 60  CHOLHDL 2.3  --  2.2   TSH: No results for input(s): TSH in the last 8760 hours. A1C: Lab Results  Component  Value Date   HGBA1C 7.6 (H) 01/12/2020     Assessment/Plan 1. Osteoarthritis, unspecified osteoarthritis type, unspecified site - now working more and having increased pain, tylenol has not been effective. previously she was taking 2 tablets in 24 hours therefore she went up to this but ran out of medication with only 30 tablets sooner. She is actually due for refill at this time (not early). Discuss use of ice when she  is overusing knees. And trying not to over do it. To use tylenol 500 mg 2 tablets every 8 hours as needed pain, if this not effective to use tramadol.  - traMADol (ULTRAM) 50 MG tablet; Take 1 tablet (50 mg total) by mouth every 12 (twelve) hours as needed for severe pain.  Dispense: 60 tablet; Refill: 0  Next appt: 05/24/2020 Carlos American. [place], [facility] [phone]

## 2020-04-15 ENCOUNTER — Other Ambulatory Visit: Payer: Self-pay | Admitting: Nurse Practitioner

## 2020-04-20 ENCOUNTER — Other Ambulatory Visit: Payer: Self-pay | Admitting: Nurse Practitioner

## 2020-04-20 DIAGNOSIS — M1990 Unspecified osteoarthritis, unspecified site: Secondary | ICD-10-CM

## 2020-04-22 NOTE — Telephone Encounter (Signed)
Non-opioid treatment agreement on file from: 08/18/2019  RX last filled on  03/13/2020 in Epic

## 2020-05-10 ENCOUNTER — Encounter: Payer: 59 | Admitting: Nurse Practitioner

## 2020-05-13 ENCOUNTER — Other Ambulatory Visit: Payer: Self-pay | Admitting: Nurse Practitioner

## 2020-05-22 ENCOUNTER — Telehealth: Payer: Self-pay | Admitting: *Deleted

## 2020-05-22 NOTE — Telephone Encounter (Signed)
Called and spoke with the patient regarding her referral from Dr Ulanda Edison office. Offered the patient an appt for this Friday; patient declined the appt. Patient stated that "I have two appts already that day. I work third shift, any way I can come first thing in the morning." Patient scheduled for 9/20 at 9 am with Dr Denman George. Gave the patient the address and phone number for the clinic; also gave the policy for mask and visitors.

## 2020-05-24 ENCOUNTER — Telehealth: Payer: Self-pay

## 2020-05-24 ENCOUNTER — Other Ambulatory Visit: Payer: Self-pay

## 2020-05-24 ENCOUNTER — Ambulatory Visit (INDEPENDENT_AMBULATORY_CARE_PROVIDER_SITE_OTHER): Payer: 59 | Admitting: Nurse Practitioner

## 2020-05-24 ENCOUNTER — Encounter: Payer: Self-pay | Admitting: Nurse Practitioner

## 2020-05-24 VITALS — BP 137/73 | Temp 97.2°F | Wt 182.0 lb

## 2020-05-24 DIAGNOSIS — Z0000 Encounter for general adult medical examination without abnormal findings: Secondary | ICD-10-CM | POA: Diagnosis not present

## 2020-05-24 NOTE — Progress Notes (Signed)
   This service is provided via telemedicine  No vital signs collected/recorded due to the encounter was a telemedicine visit.   Location of patient (ex: home, work):  Home   Patient consents to a telephone visit:  Yes, see telephone encounter dated 05/24/2020 with annual consent   Location of the provider (ex: office, home): [facility], Office   Name of any referring provider: N/A  Names of all persons participating in the telemedicine service and their role in the encounter:  S.Chrae B/CMA, Sherrie Mustache, NP, and Patient   Time spent on call:  9 min with medical assistant

## 2020-05-24 NOTE — Patient Instructions (Signed)
[name] , Thank you for taking time to come for your Medicare Wellness Visit. I appreciate your ongoing commitment to your health goals. Please review the following plan we discussed and let me know if I can assist you in the future.   Screening recommendations/referrals: Colonoscopy up to date Mammogram up to date Bone Density up to date Recommended yearly ophthalmology/optometry visit for glaucoma screening and checkup Recommended yearly dental visit for hygiene and checkup  Vaccinations: Influenza vaccine- up to date Pneumococcal vaccine up to date Tdap vaccine up to date Shingles vaccine up to date    Advanced directives: recommended to look over MOST form, will complete together at next visit.  Conditions/risks identified: advanced age, hyperlipidemia, diabetes  Next appointment: 1 year.    Preventive Care 71 Years and Older, Female Preventive care refers to lifestyle choices and visits with your health care provider that can promote health and wellness. What does preventive care include?  A yearly physical exam. This is also called an annual well check.  Dental exams once or twice a year.  Routine eye exams. Ask your health care provider how often you should have your eyes checked.  Personal lifestyle choices, including:  Daily care of your teeth and gums.  Regular physical activity.  Eating a healthy diet.  Avoiding tobacco and drug use.  Limiting alcohol use.  Practicing safe sex.  Taking low-dose aspirin every day.  Taking vitamin and mineral supplements as recommended by your health care provider. What happens during an annual well check? The services and screenings done by your health care provider during your annual well check will depend on your age, overall health, lifestyle risk factors, and family history of disease. Counseling  Your health care provider may ask you questions about your:  Alcohol use.  Tobacco use.  Drug  use.  Emotional well-being.  Home and relationship well-being.  Sexual activity.  Eating habits.  History of falls.  Memory and ability to understand (cognition).  Work and work Statistician.  Reproductive health. Screening  You may have the following tests or measurements:  Height, weight, and BMI.  Blood pressure.  Lipid and cholesterol levels. These may be checked every 5 years, or more frequently if you are over 40 years old.  Skin check.  Lung cancer screening. You may have this screening every year starting at age 71 if you have a 30-pack-year history of smoking and currently smoke or have quit within the past 15 years.  Fecal occult blood test (FOBT) of the stool. You may have this test every year starting at age 50.  Flexible sigmoidoscopy or colonoscopy. You may have a sigmoidoscopy every 5 years or a colonoscopy every 10 years starting at age 25.  Hepatitis C blood test.  Hepatitis B blood test.  Sexually transmitted disease (STD) testing.  Diabetes screening. This is done by checking your blood sugar (glucose) after you have not eaten for a while (fasting). You may have this done every 1-3 years.  Bone density scan. This is done to screen for osteoporosis. You may have this done starting at age 71.  Mammogram. This may be done every 1-2 years. Talk to your health care provider about how often you should have regular mammograms. Talk with your health care provider about your test results, treatment options, and if necessary, the need for more tests. Vaccines  Your health care provider may recommend certain vaccines, such as:  Influenza vaccine. This is recommended every year.  Tetanus, diphtheria, and acellular  pertussis (Tdap, Td) vaccine. You may need a Td booster every 10 years.  Zoster vaccine. You may need this after age 71.  Pneumococcal 13-valent conjugate (PCV13) vaccine. One dose is recommended after age 71.  Pneumococcal polysaccharide  (PPSV23) vaccine. One dose is recommended after age 70. Talk to your health care provider about which screenings and vaccines you need and how often you need them. This information is not intended to replace advice given to you by your health care provider. Make sure you discuss any questions you have with your health care provider. Document Released: 09/20/2015 Document Revised: 05/13/2016 Document Reviewed: 06/25/2015 Elsevier Interactive Patient Education  2017 [employer] Prevention in the Home Falls can cause injuries. They can happen to people of all ages. There are many things you can do to make your home safe and to help prevent falls. What can I do on the outside of my home?  Regularly fix the edges of walkways and driveways and fix any cracks.  Remove anything that might make you trip as you walk through a door, such as a raised step or threshold.  Trim any bushes or trees on the path to your home.  Use bright outdoor lighting.  Clear any walking paths of anything that might make someone trip, such as rocks or tools.  Regularly check to see if handrails are loose or broken. Make sure that both sides of any steps have handrails.  Any raised decks and porches should have guardrails on the edges.  Have any leaves, snow, or ice cleared regularly.  Use sand or salt on walking paths during winter.  Clean up any spills in your garage right away. This includes oil or grease spills. What can I do in the bathroom?  Use night lights.  Install grab bars by the toilet and in the tub and shower. Do not use towel bars as grab bars.  Use non-skid mats or decals in the tub or shower.  If you need to sit down in the shower, use a plastic, non-slip stool.  Keep the floor dry. Clean up any water that spills on the floor as soon as it happens.  Remove soap buildup in the tub or shower regularly.  Attach bath mats securely with double-sided non-slip rug tape.  Do not have  throw rugs and other things on the floor that can make you trip. What can I do in the bedroom?  Use night lights.  Make sure that you have a light by your bed that is easy to reach.  Do not use any sheets or blankets that are too big for your bed. They should not hang down onto the floor.  Have a firm chair that has side arms. You can use this for support while you get dressed.  Do not have throw rugs and other things on the floor that can make you trip. What can I do in the kitchen?  Clean up any spills right away.  Avoid walking on wet floors.  Keep items that you use a lot in easy-to-reach places.  If you need to reach something above you, use a strong step stool that has a grab bar.  Keep electrical cords out of the way.  Do not use floor polish or wax that makes floors slippery. If you must use wax, use non-skid floor wax.  Do not have throw rugs and other things on the floor that can make you trip. What can I do with my stairs?  Do not leave any items on the stairs.  Make sure that there are handrails on both sides of the stairs and use them. Fix handrails that are broken or loose. Make sure that handrails are as long as the stairways.  Check any carpeting to make sure that it is firmly attached to the stairs. Fix any carpet that is loose or worn.  Avoid having throw rugs at the top or bottom of the stairs. If you do have throw rugs, attach them to the floor with carpet tape.  Make sure that you have a light switch at the top of the stairs and the bottom of the stairs. If you do not have them, ask someone to add them for you. What else can I do to help prevent falls?  Wear shoes that:  Do not have high heels.  Have rubber bottoms.  Are comfortable and fit you well.  Are closed at the toe. Do not wear sandals.  If you use a stepladder:  Make sure that it is fully opened. Do not climb a closed stepladder.  Make sure that both sides of the stepladder are  locked into place.  Ask someone to hold it for you, if possible.  Clearly mark and make sure that you can see:  Any grab bars or handrails.  First and last steps.  Where the edge of each step is.  Use tools that help you move around (mobility aids) if they are needed. These include:  Canes.  Walkers.  Scooters.  Crutches.  Turn on the lights when you go into a dark area. Replace any light bulbs as soon as they burn out.  Set up your furniture so you have a clear path. Avoid moving your furniture around.  If any of your floors are uneven, fix them.  If there are any pets around you, be aware of where they are.  Review your medicines with your doctor. Some medicines can make you feel dizzy. This can increase your chance of falling. Ask your doctor what other things that you can do to help prevent falls. This information is not intended to replace advice given to you by your health care provider. Make sure you discuss any questions you have with your health care provider. Document Released: 06/20/2009 Document Revised: 01/30/2016 Document Reviewed: 09/28/2014 Elsevier Interactive Patient Education  2017 [employer].

## 2020-05-24 NOTE — Progress Notes (Signed)
Subjective:   [name] is a 69 y.o. female who presents for Medicare Annual (Subsequent) preventive examination.  Review of Systems     Cardiac Risk Factors include: diabetes mellitus;dyslipidemia;hypertension;advanced age (>11men, >5 women)     Objective:    Today's Vitals   05/24/20 0819  BP: 137/73  Temp: (!) 97.2 F (36.2 C)  Weight: 182 lb (82.6 kg)   Body mass index is 29.38 kg/m.  Advanced Directives 05/24/2020 01/12/2020 10/13/2019 09/16/2019 09/15/2019 05/05/2019 10/15/2017  Does Patient Have a Medical Advance Directive? No No No No No No No  Would patient like information on creating a medical advance directive? Yes (MAU/Ambulatory/Procedural Areas - Information given) Yes (ED - Information included in AVS) No - Patient declined Yes (Inpatient - patient requests chaplain consult to create a medical advance directive) - No - Patient declined No - Patient declined    Current Medications (verified) Outpatient Encounter Medications as of 05/24/2020  Medication Sig  . Accu-Chek FastClix Lancets MISC Check blood sugar once daily as directed E11.59  . ACCU-CHEK GUIDE test strip CHECK BLOOD SUGAR ONCE     DAILY AS DIRECTED.  [name] Kitchen amLODipine (NORVASC) 10 MG tablet TAKE 1/2 TABLET ($RemoveBef'5MG'YvNdrCHcyX$  TOTAL)DAILY  . apixaban (ELIQUIS) 5 MG TABS tablet Take 1 tablet (5 mg total) by mouth 2 (two) times daily.  [name] Kitchen atorvastatin (LIPITOR) 40 MG tablet Take 40 mg by mouth daily.  . Blood Glucose Monitoring Suppl (ACCU-CHEK AVIVA PLUS) w/Device KIT Check blood sugar once daily as directed E11.59  Acc-Chek guide  . carvedilol (COREG) 25 MG tablet TAKE 1 TABLET TWICE DAILY  WITH MEALS  . Ferrous Sulfate (IRON) 325 (65 Fe) MG TABS Take by mouth daily.  [name] Kitchen losartan (COZAAR) 100 MG tablet TAKE 1 TABLET BY MOUTH DAILY TO CONTROL BLOOD PRESSURE.  . Menthol, Topical Analgesic, (BIOFREEZE) 4 % GEL Apply 3 oz topically 3 (three) times daily as needed. Apply to both knees for pain  . metFORMIN (GLUCOPHAGE) 1000 MG  tablet TAKE 1 TABLET TWICE DAILY  WITH MEALS  . rOPINIRole (REQUIP) 0.25 MG tablet Take 1 tablet (0.25 mg total) by mouth at bedtime. For restless legs  . rosuvastatin (CRESTOR) 40 MG tablet Take 1 tablet (40 mg total) by mouth daily.  . traMADol (ULTRAM) 50 MG tablet TAKE 1 TABLET (50 MG TOTAL) BY MOUTH EVERY 12 (TWELVE) HOURS AS NEEDED FOR SEVERE PAIN.  . [DISCONTINUED] albuterol (VENTOLIN HFA) 108 (90 Base) MCG/ACT inhaler Inhale 2 puffs into the lungs every 6 (six) hours as needed for wheezing or shortness of breath.   No facility-administered encounter medications on file as of 05/24/2020.    Allergies (verified) Hydrocodone and Lisinopril   History: Past Medical History:  Diagnosis Date  . Anxiety   . Arthritis    osteoarthritis  . Breast cancer ([place]) 2008   right, ER/PR -  . CHF (congestive heart failure) ([place])   . Depression   . Diabetes mellitus    TYPE 2  . Gastroparesis   . Hx of radiation therapy 04/04/07 to 05/20/07   right breast/6260 cGy  . Hypercholesterolemia   . Hypertension   . Lumbago   . Malignant neoplasm of breast (female), unspecified site   . Nonspecific elevation of levels of transaminase or lactic acid dehydrogenase (LDH)   . Osteoarthrosis, unspecified whether generalized or localized, unspecified site   . Regional enteritis of unspecified site   . Restless legs syndrome (RLS)   . TIA (transient ischemic attack)   .  Urinary frequency   . Uterine prolapse without mention of vaginal wall prolapse   . Vitamin deficiency    Past Surgical History:  Procedure Laterality Date  . ABDOMINAL HYSTERECTOMY  1997   TAH/BSO, ENDOMETRIAL SARCOMA  . ARTHROPLASTY     left knee  . BREAST LUMPECTOMY Right 11/17/06   re-excision 01/13/07  . CATARACT EXTRACTION Right 08/2019  . CATARACT EXTRACTION Left 07/2019  . RADICAL HYSTERECTOMY  1997  . TOTAL KNEE ARTHROPLASTY Left 06/10/2011      . TOTAL KNEE ARTHROPLASTY Right 2006, 2009   Dr Para March  . TOTAL KNEE  ARTHROPLASTY Left 2008   Dr Para March  . WRIST SURGERY     carpel tunnel   Family History  Problem Relation Age of Onset  . Diabetes Mother   . Diabetes Father   . Aneurysm Sister   . Arthritis Sister   . Diabetes Brother   . Heart Problems Brother   . Colon cancer Neg Hx    Social History   Socioeconomic History  . Marital status: Widowed    Spouse name: Not on file  . Number of children: 4  . Years of education: Not on file  . Highest education level: Not on file  Occupational History  . Occupation: [employer]  Tobacco Use  . Smoking status: Former Smoker    Packs/day: 0.50    Years: 20.00    Pack years: 10.00    Types: Cigarettes    Quit date: 09/08/1991    Years since quitting: 28.7  . Smokeless tobacco: Never Used  Vaping Use  . Vaping Use: Never used  Substance and Sexual Activity  . Alcohol use: No  . Drug use: No  . Sexual activity: Never  Other Topics Concern  . Not on file  Social History Narrative   Widowed   Lives with daughter   Former smoker-stopped 1993   Alcohol none   Exercise 3 days a week goes to [employer]    Social Determinants of Health   Financial Resource Strain:   . Difficulty of Paying Living Expenses: Not on file  Food Insecurity:   . Worried About Charity fundraiser in the Last Year: Not on file  . Ran Out of Food in the Last Year: Not on file  Transportation Needs:   . Lack of Transportation (Medical): Not on file  . Lack of Transportation (Non-Medical): Not on file  Physical Activity:   . Days of Exercise per Week: Not on file  . Minutes of Exercise per Session: Not on file  Stress:   . Feeling of Stress : Not on file  Social Connections:   . Frequency of Communication with Friends and Family: Not on file  . Frequency of Social Gatherings with Friends and Family: Not on file  . Attends Religious Services: Not on file  . Active Member of Clubs or Organizations: Not on file  . Attends Archivist Meetings:  Not on file  . Marital Status: Not on file    Tobacco Counseling Counseling given: Not Answered   Clinical Intake:  Pre-visit preparation completed: Yes  Pain : No/denies pain     BMI - recorded: 29.38 Nutritional Status: BMI 25 -29 Overweight Nutritional Risks: None Diabetes: Yes CBG done?: Yes (98 (home reading))  How often do you need to have someone help you when you read instructions, pamphlets, or other written materials from your doctor or pharmacy?: 3 - Sometimes  Diabetic?no  Activities of Daily Living In your present state of health, do you have any difficulty performing the following activities: 05/24/2020 09/16/2019  Hearing? N N  Vision? N N  Difficulty concentrating or making decisions? N N  Walking or climbing stairs? N N  Dressing or bathing? N N  Doing errands, shopping? N N  Preparing Food and eating ? N -  Using the Toilet? N -  In the past six months, have you accidently leaked urine? N -  Do you have problems with loss of bowel control? N -  Managing your Medications? N -  Managing your Finances? N -  Housekeeping or managing your Housekeeping? N -  Some recent data might be hidden    Patient Care Team: Lauree Chandler, NP as PCP - General (Geriatric Medicine) Katherine Mantle, [place] (Optometry)  Indicate any recent Medical Services you may have received from other than [facility] providers in the past year (date may be approximate).     Assessment:   This is a routine wellness examination for [facility].  Hearing/Vision screen  Hearing Screening   125Hz  250Hz  500Hz  1000Hz  2000Hz  3000Hz  4000Hz  6000Hz  8000Hz   Right ear:           Left ear:           Comments: No hearing issues   Vision Screening Comments: Last eye exam within last 12 months   Dietary issues and exercise activities discussed: Current Exercise Habits: The patient does not participate in regular exercise at present  Goals    . Increase physical activity     Start using  stationary bike 45 mins 3 days a week       Depression Screen PHQ 2/9 Scores 05/24/2020 09/25/2019 05/05/2019 04/29/2018 10/15/2017 04/09/2017 04/10/2016  PHQ - 2 Score 0 0 0 0 0 0 0  PHQ- 9 Score - - - - - - -    Fall Risk Fall Risk  05/24/2020 01/12/2020 11/08/2019 10/13/2019 09/25/2019  Falls in the past year? 0 0 1 0 0  Number falls in past yr: 0 0 0 0 0  Injury with Fall? 0 0 0 0 0    Any stairs in or around the home? No  If so, are there any without handrails? No  Home free of loose throw rugs in walkways, pet beds, electrical cords, etc? Yes  Adequate lighting in your home to reduce risk of falls? Yes   ASSISTIVE DEVICES UTILIZED TO PREVENT FALLS:  Life alert? No  Use of a cane, walker or w/c? No  Grab bars in the bathroom? Yes  Shower chair or bench in shower? No  Elevated toilet seat or a handicapped toilet? Yes   TIMED UP AND GO:  Was the test performed? No .    Cognitive Function: MMSE - Mini Mental State Exam 04/29/2018 04/09/2017 04/10/2016 04/05/2015  Orientation to time 5 5 5 5   Orientation to Place 5 5 5 5   Registration 3 3 3 3   Attention/ Calculation 5 4 5 4   Recall 3 3 2 2   Language- name 2 objects 2 2 2 2   Language- repeat 1 1 1 1   Language- follow 3 step command 3 3 3 3   Language- read & follow direction 1 1 1 1   Write a sentence 0 1 1 1   Copy design 1 0 0 1  Total score 29 28 28 28      6CIT Screen 05/24/2020  What Year? 0 points  What month? 0 points  What  time? 0 points  Count back from 20 0 points  Months in reverse 0 points  Repeat phrase 6 points  Total Score 6    Immunizations Immunization History  Administered Date(s) Administered  . Fluad Quad(high Dose 65+) 05/05/2019  . Influenza, High Dose Seasonal PF 06/04/2017, 05/24/2018  . Influenza,inj,Quad PF,6+ Mos 07/05/2015  . Influenza-Unspecified 07/05/2013, 06/07/2014, 07/08/2016, 05/22/2020  . PFIZER SARS-COV-2 Vaccination 12/22/2019, 01/12/2020  . Pneumococcal Conjugate-13 03/07/2014  .  Pneumococcal Polysaccharide-23 06/08/1999, 11/04/2018  . Td 09/08/2003  . Tdap 11/29/2013  . Zoster 08/08/2013  . Zoster Recombinat (Shingrix) 05/07/2019, 09/07/2019    TDAP status: Up to date Flu shot due at this tim Pneumococcal vaccine status: Up to date Covid-19 vaccine status: Completed vaccines  Qualifies for Shingles Vaccine? Yes   Zostavax completed Yes   Shingrix Completed?: Yes  Screening Tests Health Maintenance  Topic Date Due  . OPHTHALMOLOGY EXAM  04/29/2019  . FOOT EXAM  05/04/2020  . HEMOGLOBIN A1C  07/14/2020  . MAMMOGRAM  03/01/2022  . TETANUS/TDAP  11/30/2023  . COLONOSCOPY  01/17/2025  . INFLUENZA VACCINE  Completed  . DEXA SCAN  Completed  . COVID-19 Vaccine  Completed  . PNA vac Low Risk Adult  Completed  . Hepatitis C Screening  Discontinued    Health Maintenance  Health Maintenance Due  Topic Date Due  . OPHTHALMOLOGY EXAM  04/29/2019  . FOOT EXAM  05/04/2020    Colorectal cancer screening: Completed 2016. Repeat every 10 years Mammogram status: Completed 2021. Repeat every year Bone Density status: Completed 2020. Results reflect: Bone density results: OSTEOPENIA. Repeat every 2 years.  Lung Cancer Screening: (Low Dose CT Chest recommended if Age 39-80 years, 30 pack-year currently smoking OR have quit w/in 15years.) does not qualify.   Lung Cancer Screening Referral: na  Additional Screening:  Hepatitis C Screening: does qualify; Completed - will get at next blood draw  Vision Screening: Recommended annual ophthalmology exams for early detection of glaucoma and other disorders of the eye. Is the patient up to date with their annual eye exam?  Yes  Who is the provider or what is the name of the office in which the patient attends annual eye exams? Dr Sherran Ne[place], [place] If pt is not established with a provider, would they like to be referred to a provider to establish care? No .   Dental Screening: Recommended annual  dental exams for proper oral hygiene  Community Resource Referral / Chronic Care Management: CRR required this visit?  No   CCM required this visit?  No      Plan:     I have personally reviewed and noted the following in the patient's chart:   . Medical and social history . Use of alcohol, tobacco or illicit drugs  . Current medications and supplements . Functional ability and status . Nutritional status . Physical activity . Advanced directives . List of other physicians . Hospitalizations, surgeries, and ER visits in previous 12 months . Vitals . Screenings to include cognitive, depression, and falls . Referrals and appointments  In addition, I have reviewed and discussed with patient certain preventive protocols, quality metrics, and best practice recommendations. A written personalized care plan for preventive services as well as general preventive health recommendations were provided to patient.     Lauree Chandler, NP   05/24/2020    Virtual Visit via Telephone Note  I connected with@ on 05/24/20 at  8:30 AM EDT by telephone and verified that  I am speaking with the correct person using two identifiers.  Location: Patient: home Provider: [place]   I discussed the limitations, risks, security and privacy concerns of performing an evaluation and management service by telephone and the availability of in person appointments. I also discussed with the patient that there may be a patient responsible charge related to this service. The patient expressed understanding and agreed to proceed.   I discussed the assessment and treatment plan with the patient. The patient was provided an opportunity to ask questions and all were answered. The patient agreed with the plan and demonstrated an understanding of the instructions.   The patient was advised to call back or seek an in-person evaluation if the symptoms worsen or if the condition fails to improve as anticipated.  I  provided 18 minutes of non-face-to-face time during this encounter.  Carlos American. Harle Battiest Avs printed and mailed

## 2020-05-24 NOTE — Telephone Encounter (Signed)
Ms. marquis, diles are scheduled for a virtual visit with your provider today.    Just as we do with appointments in the office, we must obtain your consent to participate.  Your consent will be active for this visit and any virtual visit you may have with one of our providers in the next 365 days.    If you have a MyChart account, I can also send a copy of this consent to you electronically.  All virtual visits are billed to your insurance company just like a traditional visit in the office.  As this is a virtual visit, video technology does not allow for your provider to perform a traditional examination.  This may limit your provider's ability to fully assess your condition.  If your provider identifies any concerns that need to be evaluated in person or the need to arrange testing such as labs, EKG, etc, we will make arrangements to do so.    Although advances in technology are sophisticated, we cannot ensure that it will always work on either your end or our end.  If the connection with a video visit is poor, we may have to switch to a telephone visit.  With either a video or telephone visit, we are not always able to ensure that we have a secure connection.   I need to obtain your verbal consent now.   Are you willing to proceed with your visit today?   Kennley Schwandt Coulibaly has provided verbal consent on 05/24/2020 for a virtual visit (video or telephone).   Leigh Aurora [place], [place] 05/24/2020  8:11 AM

## 2020-05-27 ENCOUNTER — Other Ambulatory Visit: Payer: Self-pay

## 2020-05-27 ENCOUNTER — Inpatient Hospital Stay: Payer: 59 | Attending: Gynecologic Oncology | Admitting: Gynecologic Oncology

## 2020-05-27 ENCOUNTER — Encounter: Payer: Self-pay | Admitting: Gynecologic Oncology

## 2020-05-27 VITALS — BP 158/62 | HR 71 | Temp 97.9°F | Resp 18 | Ht 66.0 in | Wt 182.8 lb

## 2020-05-27 DIAGNOSIS — E1143 Type 2 diabetes mellitus with diabetic autonomic (poly)neuropathy: Secondary | ICD-10-CM | POA: Insufficient documentation

## 2020-05-27 DIAGNOSIS — Z8542 Personal history of malignant neoplasm of other parts of uterus: Secondary | ICD-10-CM | POA: Insufficient documentation

## 2020-05-27 DIAGNOSIS — Z923 Personal history of irradiation: Secondary | ICD-10-CM | POA: Diagnosis not present

## 2020-05-27 DIAGNOSIS — I130 Hypertensive heart and chronic kidney disease with heart failure and stage 1 through stage 4 chronic kidney disease, or unspecified chronic kidney disease: Secondary | ICD-10-CM | POA: Diagnosis not present

## 2020-05-27 DIAGNOSIS — M1990 Unspecified osteoarthritis, unspecified site: Secondary | ICD-10-CM | POA: Diagnosis not present

## 2020-05-27 DIAGNOSIS — N939 Abnormal uterine and vaginal bleeding, unspecified: Secondary | ICD-10-CM | POA: Diagnosis not present

## 2020-05-27 DIAGNOSIS — Z87891 Personal history of nicotine dependence: Secondary | ICD-10-CM | POA: Diagnosis not present

## 2020-05-27 DIAGNOSIS — Z90722 Acquired absence of ovaries, bilateral: Secondary | ICD-10-CM | POA: Insufficient documentation

## 2020-05-27 DIAGNOSIS — Z85831 Personal history of malignant neoplasm of soft tissue: Secondary | ICD-10-CM

## 2020-05-27 DIAGNOSIS — Z853 Personal history of malignant neoplasm of breast: Secondary | ICD-10-CM | POA: Diagnosis not present

## 2020-05-27 DIAGNOSIS — N898 Other specified noninflammatory disorders of vagina: Secondary | ICD-10-CM

## 2020-05-27 DIAGNOSIS — Z7984 Long term (current) use of oral hypoglycemic drugs: Secondary | ICD-10-CM | POA: Insufficient documentation

## 2020-05-27 DIAGNOSIS — I509 Heart failure, unspecified: Secondary | ICD-10-CM | POA: Diagnosis not present

## 2020-05-27 DIAGNOSIS — E1122 Type 2 diabetes mellitus with diabetic chronic kidney disease: Secondary | ICD-10-CM | POA: Insufficient documentation

## 2020-05-27 DIAGNOSIS — Z7901 Long term (current) use of anticoagulants: Secondary | ICD-10-CM | POA: Diagnosis not present

## 2020-05-27 DIAGNOSIS — D398 Neoplasm of uncertain behavior of other specified female genital organs: Secondary | ICD-10-CM | POA: Insufficient documentation

## 2020-05-27 DIAGNOSIS — Z79899 Other long term (current) drug therapy: Secondary | ICD-10-CM | POA: Diagnosis not present

## 2020-05-27 DIAGNOSIS — E7800 Pure hypercholesterolemia, unspecified: Secondary | ICD-10-CM | POA: Diagnosis not present

## 2020-05-27 DIAGNOSIS — N950 Postmenopausal bleeding: Secondary | ICD-10-CM | POA: Diagnosis not present

## 2020-05-27 DIAGNOSIS — Z90710 Acquired absence of both cervix and uterus: Secondary | ICD-10-CM | POA: Diagnosis not present

## 2020-05-27 DIAGNOSIS — Z8673 Personal history of transient ischemic attack (TIA), and cerebral infarction without residual deficits: Secondary | ICD-10-CM | POA: Insufficient documentation

## 2020-05-27 DIAGNOSIS — N8110 Cystocele, unspecified: Secondary | ICD-10-CM | POA: Diagnosis not present

## 2020-05-27 DIAGNOSIS — I4891 Unspecified atrial fibrillation: Secondary | ICD-10-CM | POA: Diagnosis not present

## 2020-05-27 NOTE — Patient Instructions (Signed)
Dr Denman George does not feel that this is a recurrence of your cancer. Dr Serita Grit office will call you with the results from the biopsy of the mass when it's available (likely later this week). It is normal to have spotting for the next week from the biopsy. No restrictions following the biopsy. You can replace your pessary in the vagina.  Dr Serita Grit office can be reached at [phone].

## 2020-05-27 NOTE — Progress Notes (Signed)
Consult Note: Gyn-Onc  Consult was requested by Dr. Ulanda Edison for the evaluation of [name] 69 y.o. female  CC:  Chief Complaint  Patient presents with  . history of stromal sarcoma of uterus (low grade)  . Vaginal Bleeding  . Vaginal Prolapse  . vaginal mass    Assessment/Plan:  [name]  is a 69 y.o.  year old with a remote history of low-grade endometrial stromal sarcoma, now with new onset of vaginal bleeding in the setting of an abnormality in the right side of the vaginal cuff.  Based on my visual inspection and palpation of the area, I have a very low suspicion that this is cancer recurrence.  Nonetheless we will follow-up on the biopsy results and notify the patient of these when available.  I suspect that her bleeding was secondary to a dogear in the vaginal cuff at the right side causing a protrusion, in the setting of Eliquis use, and vaginal atrophy, this likely contributed to an episode of bleeding.  I recommended if she develops this again she try removing the pessary for a week or 2 to see if this again improves her symptoms.  I explained that while there is no evidence of recurrence on today's exam, should she have return of her bleeding symptoms, she should again see Dr. Ulanda Edison.  There are options for treatment to improve the integrity of the vagina during pessary use.  1 of these is vaginal Premarin.  While I recognize that her prior sarcoma was an estrogen dependent lesion, given that this cancer was approximately 25 years ago with no evidence of recurrence, vaginal Premarin is almost certainly a very safe option for this patient to strengthen the vaginal mucosa if she has repeated bouts of vaginal bleeding while on Eliquis and using the pessary.   HPI: [name] is a 69 year old parous woman who was seen in consultation at the request of Dr Ulanda Edison for evaluation of vaginal bleeding (postmenopausal), and a vaginal cuff mass with a history of  uterine sarcoma.  The patient was initially treated by Dr. Marti Sleigh in 1995 for a low-grade uterine stromal (endometrial stromal) sarcoma.  She received a total abdominal hysterectomy with BSO and Burch cystourethropexy at that time.  While I do not have the pathology from that initial surgery, Dr. Mora Bellman note suggest that it was a stage I disease and no adjuvant therapy was recommended.  She was followed for more than 10 years with no evidence of recurrence.  Her last follow-up with Dr. Fermin Schwab was in July 2011 at which time she was NED.  She had symptomatic vaginal prolapse and for this was treated with a ring pessary for many years.  She uses the ring pessary Monday through Friday as it helps with bulge symptoms when she strains at work.  Of note she began using Eliquis approximately 3 years previously due to development of atrial fibrillation and atrial flutter.  In late August 2021 she began experiencing bright red vaginal bleeding.  She saw Dr. 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  The pessary remained out for 2-week.  During which time the bleeding stopped (after 2 days).  She then returned and had the pessary placed.  At reinspection, it was felt that the area was improved with no active bleeding.  She was recommended to follow-up with GYN oncology for consultation and evaluation for the  potential risk of recurrence of her prior endometrial stromal sarcoma.  Her medical history is most significant for right-sided breast cancer approximately 10 years ago, treated with surgery and radiation with no recurrence.  She was treated at the current health cancer center.  The patient is unaware if her tumor was estrogen receptor positive.  She also has a history of atrial fibrillation and atrial flutter for which she takes Eliquis and other rate controlling medications.  She  has diabetes mellitus for which she takes metformin, and this appears fairly well controlled with fasting blood glucoses typically in the 90s.  The patient has diabetes associated chronic kidney disease with most recent creatinine of 0.99 in May 2021.  She has chronic joint pain and takes tramadol for her osteoarthritis.  Her surgical history is most significant for total abdominal hysterectomy with BSO and Burch cystourethropexy for urinary incontinence in 1995.  Her gynecologic history is remarkable for stage I endometrial stromal sarcoma, low-grade in 1995 treated with hysterectomy as above.  She has history of vaginal prolapse treated with a pessary.  She denies urinary incontinence.  Her family cancer history is unremarkable.  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 She lives with her daughter and young granddaughter and is independent with activities of daily living.   Current Meds:  Outpatient Encounter Medications as of 05/27/2020  Medication Sig  . Accu-Chek FastClix Lancets MISC Check blood sugar once daily as directed E11.59  . ACCU-CHEK GUIDE test strip CHECK BLOOD SUGAR ONCE     DAILY AS DIRECTED.  [name] Kitchen amLODipine (NORVASC) 10 MG tablet TAKE 1/2 TABLET (5MG TOTAL)DAILY  . apixaban (ELIQUIS) 5 MG TABS tablet Take 1 tablet (5 mg total) by mouth 2 (two) times daily.  [name] Kitchen ascorbic acid (VITAMIN C) 500 MG tablet Take 1 tablet by mouth daily.  [name] Kitchen atorvastatin (LIPITOR) 40 MG tablet Take 40 mg by mouth daily.  . Blood Glucose Monitoring Suppl (ACCU-CHEK AVIVA PLUS) w/Device KIT Check blood sugar once daily as directed E11.59  Acc-Chek guide  . carvedilol (COREG) 25 MG tablet TAKE 1 TABLET TWICE DAILY  WITH MEALS  . Ferrous Sulfate (IRON) 325 (65 Fe) MG TABS Take by mouth daily.  [name] Kitchen losartan (COZAAR) 100 MG tablet TAKE 1 TABLET BY MOUTH DAILY TO CONTROL BLOOD PRESSURE.  . Menthol, Topical Analgesic, (BIOFREEZE) 4 % GEL Apply 3 oz  topically 3 (three) times daily as needed. Apply to both knees for pain  . metFORMIN (GLUCOPHAGE) 1000 MG tablet TAKE 1 TABLET TWICE DAILY  WITH MEALS  . rOPINIRole (REQUIP) 0.25 MG tablet Take 1 tablet (0.25 mg total) by mouth at bedtime. For restless legs  . rosuvastatin (CRESTOR) 40 MG tablet Take 1 tablet (40 mg total) by mouth daily.  . traMADol (ULTRAM) 50 MG tablet TAKE 1 TABLET (50 MG TOTAL) BY MOUTH EVERY 12 (TWELVE) HOURS AS NEEDED FOR SEVERE PAIN.   No facility-administered encounter medications on file as of 05/27/2020.    Allergy:  Allergies  Allergen Reactions  . Hydrocodone Nausea And Vomiting  . Lisinopril     Sick on the stomach    Social Hx:   Social History   Socioeconomic History  . Marital status: Widowed    Spouse name: Not on file  . Number of children: 4  . Years of education: Not on file  . Highest education level: Not on file  Occupational History  . Occupation: [employer]  Tobacco  Use  . Smoking status: Former Smoker    Packs/day: 0.50    Years: 20.00    Pack years: 10.00    Types: Cigarettes    Quit date: 09/08/1991    Years since quitting: 28.7  . Smokeless tobacco: Never Used  Vaping Use  . Vaping Use: Never used  Substance and Sexual Activity  . Alcohol use: No  . Drug use: No  . Sexual activity: Never  Other Topics Concern  . Not on file  Social History Narrative   Widowed   Lives with daughter   Former smoker-stopped 1993   Alcohol none   Exercise 3 days a week goes to [employer]    Social Determinants of Health   Financial Resource Strain:   . Difficulty of Paying Living Expenses: Not on file  Food Insecurity:   . Worried About Charity fundraiser in the Last Year: Not on file  . Ran Out of Food in the Last Year: Not on file  Transportation Needs:   . Lack of Transportation (Medical): Not on file  . Lack of Transportation (Non-Medical): Not on file  Physical Activity:   . Days of Exercise per Week: Not on file  .  Minutes of Exercise per Session: Not on file  Stress:   . Feeling of Stress : Not on file  Social Connections:   . Frequency of Communication with Friends and Family: Not on file  . Frequency of Social Gatherings with Friends and Family: Not on file  . Attends Religious Services: Not on file  . Active Member of Clubs or Organizations: Not on file  . Attends Archivist Meetings: Not on file  . Marital Status: Not on file  Intimate Partner Violence:   . Fear of Current or Ex-Partner: Not on file  . Emotionally Abused: Not on file  . Physically Abused: Not on file  . Sexually Abused: Not on file    Past Surgical Hx:  Past Surgical History:  Procedure Laterality Date  . ABDOMINAL HYSTERECTOMY  1997   TAH/BSO, ENDOMETRIAL SARCOMA  . ARTHROPLASTY     left knee  . BREAST LUMPECTOMY Right 11/17/06   re-excision 01/13/07  . CATARACT EXTRACTION Right 08/2019  . CATARACT EXTRACTION Left 07/2019  . TOTAL KNEE ARTHROPLASTY Left 06/10/2011      . TOTAL KNEE ARTHROPLASTY Right 2006, 2009   Dr Para March  . TOTAL KNEE ARTHROPLASTY Left 2008   Dr Para March  . WRIST SURGERY     carpel tunnel    Past Medical Hx:  Past Medical History:  Diagnosis Date  . Anxiety   . Arthritis    osteoarthritis  . Breast cancer ([place]) 2008   right, ER/PR -  . CHF (congestive heart failure) ([place])   . Depression   . Diabetes mellitus    TYPE 2  . Gastroparesis   . Hx of radiation therapy 04/04/07 to 05/20/07   right breast/6260 cGy  . Hypercholesterolemia   . Hypertension   . Lumbago   . Malignant neoplasm of breast (female), unspecified site   . Nonspecific elevation of levels of transaminase or lactic acid dehydrogenase (LDH)   . Osteoarthrosis, unspecified whether generalized or localized, unspecified site   . Regional enteritis of unspecified site   . Restless legs syndrome (RLS)   . TIA (transient ischemic attack)   . Urinary frequency   . Uterine prolapse without mention of vaginal wall  prolapse   . Vitamin deficiency  Past Gynecological History:  See HPI No LMP recorded. Patient has had a hysterectomy.  Family Hx:  Family History  Problem Relation Age of Onset  . Diabetes Mother   . Diabetes Father   . Aneurysm Sister   . Arthritis Sister   . Diabetes Brother   . Heart Problems Brother   . Colon cancer Neg Hx     Review of Systems:  Constitutional  Feels well,    ENT Normal appearing ears and nares bilaterally Skin/Breast  No rash, sores, jaundice, itching, dryness Cardiovascular  No chest pain, shortness of breath, or edema  Pulmonary  No cough or wheeze.  Gastro Intestinal  No nausea, vomitting, or diarrhoea. No bright red blood per rectum, no abdominal pain, change in bowel movement, or constipation.  Genito Urinary  No frequency, urgency, dysuria, + postmenopausal bleeding Musculo Skeletal  No myalgia, arthralgia, joint swelling or pain  Neurologic  No weakness, numbness, change in gait,  Psychology  No depression, anxiety, insomnia.   Vitals:  Blood pressure (!) 158/62, pulse 71, temperature 97.9 F (36.6 C), temperature source Tympanic, resp. rate 18, height _0  (1.676 m), weight 182 lb 12.8 oz (82.9 kg), SpO2 100 %.  Physical Exam: WD in NAD Neck  Supple NROM, without any enlargements.  Lymph Node Survey No cervical supraclavicular or inguinal adenopathy Cardiovascular  Pulse normal rate, regularity and rhythm. S1 and S2 normal.  Lungs  Clear to auscultation bilateraly, without wheezes/crackles/rhonchi. Good air movement.  Skin  No rash/lesions/breakdown  Psychiatry  Alert and oriented to person, place, and time  Abdomen  Normoactive bowel sounds, abdomen soft, non-tender and nonobese without evidence of hernia.  Back No CVA tenderness Genito Urinary  Vulva/vagina: Normal external female genitalia.  No lesions. No discharge or bleeding.  Bladder/urethra:  No lesions or masses, well supported bladder  Vagina: there is a  dog ear of tissue at the right vaginal cuff that has smooth mucosa and no palpable masses underlying this tissue.  Adjacent to this tissue in the right vaginal cuff suture line there is mild leukoplakia.  Both of these sites were biopsied.  There is no palpable pelvic mass. Cervix and uterus surgically absent Rectal  deferred Extremities  No bilateral cyanosis, clubbing or edema.  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  The speculum was inserted to the vagina and the entirety of the vagina was visualized.  No specific lesions that were actively bleeding were identified.  There was less than 1 cm area of leukoplakia at the right vaginal cuff and a prominent right vaginal fornix consistent with a surgical dogear.  Both sites were biopsied.  Silver nitrate was used to achieve hemostasis.  The patient tolerated the procedure well.  The specimen was sent for histopathology.   60 minutes of total time was spent for this patient encounter, including preparation, face-to-face counseling with the patient and coordination of care, review of imaging (results and images), communication with the referring provider and documentation of the encounter.   Thereasa Solo, MD  05/27/2020, 9:36 AM

## 2020-05-29 LAB — SURGICAL PATHOLOGY

## 2020-05-30 ENCOUNTER — Telehealth: Payer: Self-pay

## 2020-05-30 NOTE — Telephone Encounter (Signed)
Told Ms [name] that the biopsies showed no cancer or pre cancer.  Follow up with Dr. Ulanda Edison if bleeding symptoms occur and Dr. Denman George noted at visit 05-27-20. Pt verbalized understanding.

## 2020-06-12 ENCOUNTER — Other Ambulatory Visit: Payer: Self-pay | Admitting: Nurse Practitioner

## 2020-06-12 DIAGNOSIS — E1169 Type 2 diabetes mellitus with other specified complication: Secondary | ICD-10-CM

## 2020-06-12 NOTE — Telephone Encounter (Signed)
Notifications Back to Top   Diabetes is worse, A1c 7.6 and should be less than 7, would recommend working on diet and increasing metformin to 1000 mg twice daily (with meals- breakfast and supper). LDL cholesterol should be less than 70 and at 99, would recommend low cholesterol diet, she is already on lipitor 40 mg daily, would recommend stopping lipitor and starting crestor 40 which should help bring LDL to goal.      Lipitor was still in Current Medication List.  Discontinued per lab note and Crestor sent for refill as requested by pharmacy.

## 2020-06-13 ENCOUNTER — Other Ambulatory Visit: Payer: Self-pay | Admitting: Nurse Practitioner

## 2020-06-13 DIAGNOSIS — M1990 Unspecified osteoarthritis, unspecified site: Secondary | ICD-10-CM

## 2020-06-19 ENCOUNTER — Other Ambulatory Visit: Payer: Self-pay | Admitting: Internal Medicine

## 2020-06-19 MED ORDER — LOSARTAN POTASSIUM 100 MG PO TABS: ORAL_TABLET | ORAL | 0 refills | Status: DC

## 2020-06-19 NOTE — Telephone Encounter (Signed)
Prescription refill request for Eliquis received.  Last office visit: [name], 05/01/2019 Scr: 0.99, 01/12/2020 Age: 69 y.o. Weight: 82.9 kg   Overdue for office visit.

## 2020-06-19 NOTE — Telephone Encounter (Signed)
*  STAT* If patient is at the pharmacy, call can be transferred to refill team.   1. Which medications need to be refilled? (please list name of each medication and dose if known)   apixaban (ELIQUIS) 5 MG TABS tablet losartan (COZAAR) 100 MG tablet   2. Which pharmacy/location (including street and city if local pharmacy) is medication to be sent to?  [id number] - [place], [place] - [address]  3. Do they need a 30 day or 90 day supply?  90 days

## 2020-06-20 ENCOUNTER — Other Ambulatory Visit: Payer: Self-pay | Admitting: Internal Medicine

## 2020-06-20 NOTE — Telephone Encounter (Signed)
Appt scheduled 10/26 with Dr Caryl Comes, refill sent in with no further refills so that we can re-evaluate to ensure pt kept her appt.

## 2020-06-20 NOTE — Telephone Encounter (Addendum)
-  Age 69, weight 83kg, SCr 0.99 on 01/12/20, pt on appropriate dose -Afib indication, is overdue for follow up (last visit 05/01/19), no appts currently scheduled -Message sent to scheduling to reach out to pt to schedule annual f/u with Dr Caryl Comes

## 2020-07-02 ENCOUNTER — Ambulatory Visit (INDEPENDENT_AMBULATORY_CARE_PROVIDER_SITE_OTHER): Payer: 59 | Admitting: Internal Medicine

## 2020-07-02 ENCOUNTER — Other Ambulatory Visit: Payer: Self-pay

## 2020-07-02 ENCOUNTER — Encounter: Payer: Self-pay | Admitting: Internal Medicine

## 2020-07-02 VITALS — BP 142/76 | HR 72 | Ht 66.0 in | Wt 185.0 lb

## 2020-07-02 DIAGNOSIS — I480 Paroxysmal atrial fibrillation: Secondary | ICD-10-CM

## 2020-07-02 DIAGNOSIS — L659 Nonscarring hair loss, unspecified: Secondary | ICD-10-CM

## 2020-07-02 NOTE — Progress Notes (Signed)
He     Patient Care Team: Lauree Chandler, NP as PCP - General (Geriatric Medicine) Katherine Mantle, OD (Optometry)   HPI  [name] is a 69 y.o. female Seen in followup for a diagnosis of atrial flutter/fibrillation in the context of a cardiomyopathy and a prior TIA.  She also has hypertension  She takes apixaban.   She has 2 major problems both related to sleep.  What is poor sleep and the other is fatigue.  Perhaps they are related.  Got Covid January 2021.  Has had problems with alopecia.    DATE TEST EF   2001  LHC 40 %   6/14 Echo   65 %           Date Cr K Hgb  2/19 0.89 3.8 12.8 (8/19)   2/20 1.07 3.9 11.2  5/21 0.99 3.7 11.7       Past Medical History:  Diagnosis Date  . Anxiety   . Arthritis    osteoarthritis  . Breast cancer ([place]) 2008   right, ER/PR -  . CHF (congestive heart failure) ([place])   . Depression   . Diabetes mellitus    TYPE 2  . Gastroparesis   . Hx of radiation therapy 04/04/07 to 05/20/07   right breast/6260 cGy  . Hypercholesterolemia   . Hypertension   . Lumbago   . Malignant neoplasm of breast (female), unspecified site   . Nonspecific elevation of levels of transaminase or lactic acid dehydrogenase (LDH)   . Osteoarthrosis, unspecified whether generalized or localized, unspecified site   . Regional enteritis of unspecified site   . Restless legs syndrome (RLS)   . TIA (transient ischemic attack)   . Urinary frequency   . Uterine prolapse without mention of vaginal wall prolapse   . Vitamin deficiency     Past Surgical History:  Procedure Laterality Date  . ABDOMINAL HYSTERECTOMY  1997   TAH/BSO, ENDOMETRIAL SARCOMA  . ARTHROPLASTY     left knee  . BREAST LUMPECTOMY Right 11/17/06   re-excision 01/13/07  . CATARACT EXTRACTION Right 08/2019  . CATARACT EXTRACTION Left 07/2019  . TOTAL KNEE ARTHROPLASTY Left 06/10/2011      . TOTAL KNEE ARTHROPLASTY Right 2006, 2009   Dr Para March  . TOTAL KNEE ARTHROPLASTY  Left 2008   Dr Para March  . WRIST SURGERY     carpel tunnel    Current Outpatient Medications  Medication Sig Dispense Refill  . Accu-Chek FastClix Lancets MISC Check blood sugar once daily as directed E11.59 300 each 1  . ACCU-CHEK GUIDE test strip CHECK BLOOD SUGAR ONCE     DAILY AS DIRECTED. 100 strip 3  . amLODipine (NORVASC) 10 MG tablet TAKE 1/2 TABLET (5MG TOTAL)DAILY 45 tablet 1  . ascorbic acid (VITAMIN C) 500 MG tablet Take 1 tablet by mouth daily.    . Blood Glucose Monitoring Suppl (ACCU-CHEK AVIVA PLUS) w/Device KIT Check blood sugar once daily as directed E11.59  Acc-Chek guide 1 kit 0  . carvedilol (COREG) 25 MG tablet TAKE 1 TABLET TWICE DAILY  WITH MEALS 180 tablet 1  . ELIQUIS 5 MG TABS tablet TAKE 1 TABLET BY MOUTH TWICE A DAY 180 tablet 0  . Ferrous Sulfate (IRON) 325 (65 Fe) MG TABS Take by mouth daily.    [name] Kitchen losartan (COZAAR) 100 MG tablet TAKE 1 TABLET BY MOUTH DAILY TO CONTROL BLOOD PRESSURE. Please make overdue appt with Dr. Caryl Comes before anymore refills. 1st attempt 30 tablet 0  .  Menthol, Topical Analgesic, (BIOFREEZE) 4 % GEL Apply 3 oz topically 3 (three) times daily as needed. Apply to both knees for pain 90 g 3  . metFORMIN (GLUCOPHAGE) 1000 MG tablet TAKE 1 TABLET TWICE DAILY  WITH MEALS 180 tablet 1  . nystatin ointment (MYCOSTATIN) Apply topically as needed.    [name] Kitchen rOPINIRole (REQUIP) 0.25 MG tablet Take 1 tablet (0.25 mg total) by mouth at bedtime. For restless legs 90 tablet 1  . rosuvastatin (CRESTOR) 40 MG tablet TAKE 1 TABLET DAILY 90 tablet 1  . traMADol (ULTRAM) 50 MG tablet TAKE 1 TABLET BY MOUTH EVERY 12 HOURS AS NEEDED FOR SEVERE PAIN. 60 tablet 0  . triamcinolone ointment (KENALOG) 0.1 % as needed.     No current facility-administered medications for this visit.    Allergies  Allergen Reactions  . Hydrocodone Nausea And Vomiting  . Lisinopril     Sick on the stomach    Review of Systems negative except from HPI and PMH  Physical Exam BP (!)  142/76   Pulse 72   Ht 5' 6" (1.676 m)   Wt 185 lb (83.9 kg)   SpO2 98%   BMI 29.86 kg/m  Well developed and nourished in no acute distress HENT normal Neck supple with JVP-  flat   Clear Regular rate and rhythm, no murmurs or gallops Abd-soft with active BS No Clubbing cyanosis edema Skin-warm and dry A & Oriented  Grossly normal sensory and motor function Reflexes with slow relaxation  ECG sinus at 72 Interval 22/09/39    Assessment and  Plan  Hypertension  Afib    TIA  No edema.  Blood pressure mildly elevated; however, she is on a disturbed sleep wake cycle as she is still working nights (after 40 years)  Discussed strategies to improve sleep hygiene including white noise, diphenhydramine in addition to her melatonin.  No interval arrhythmias of which she is aware.  No bleeding.  Hemoglobin stable 5/21  With alopecia, sluggish reflexes and fatigue, will check her TSH; last checked 8/19

## 2020-07-02 NOTE — Patient Instructions (Signed)
Medication Instructions:  Your physician recommends that you continue on your current medications as directed. Please refer to the Current Medication list given to you today. *If you need a refill on your cardiac medications before your next appointment, please call your pharmacy*   Lab Work: TSH today If you have labs (blood work) drawn today and your tests are completely normal, you will receive your results only by: [name] Kitchen MyChart Message (if you have MyChart) OR . A paper copy in the mail If you have any lab test that is abnormal or we need to change your treatment, we will call you to review the results.   Testing/Procedures: None ordered. \   Follow-Up: At [facility], you and your health needs are our priority.  As part of our continuing mission to provide you with exceptional heart care, we have created designated Provider Care Teams.  These Care Teams include your primary Cardiologist (physician) and Advanced Practice Providers (APPs -  Physician Assistants and Nurse Practitioners) who all work together to provide you with the care you need, when you need it.  We recommend signing up for the patient portal called "MyChart".  Sign up information is provided on this After Visit Summary.  MyChart is used to connect with patients for Virtual Visits (Telemedicine).  Patients are able to view lab/test results, encounter notes, upcoming appointments, etc.  Non-urgent messages can be sent to your provider as well.   To learn more about what you can do with MyChart, go to [email or web address].    Your next appointment:   6 month(s)  The format for your next appointment:   In Person  Provider:   [name], MD

## 2020-07-03 LAB — TSH: TSH: 0.556 u[IU]/mL (ref 0.450–4.500)

## 2020-07-09 ENCOUNTER — Other Ambulatory Visit: Payer: Self-pay | Admitting: Nurse Practitioner

## 2020-07-12 ENCOUNTER — Other Ambulatory Visit: Payer: Self-pay | Admitting: Internal Medicine

## 2020-07-15 ENCOUNTER — Ambulatory Visit: Payer: Medicare Other | Admitting: Nurse Practitioner

## 2020-07-17 ENCOUNTER — Other Ambulatory Visit: Payer: Self-pay

## 2020-07-17 ENCOUNTER — Encounter: Payer: Self-pay | Admitting: Nurse Practitioner

## 2020-07-17 ENCOUNTER — Ambulatory Visit (INDEPENDENT_AMBULATORY_CARE_PROVIDER_SITE_OTHER): Payer: 59 | Admitting: Nurse Practitioner

## 2020-07-17 VITALS — BP 144/80 | HR 73 | Temp 97.3°F | Ht 66.0 in | Wt 187.0 lb

## 2020-07-17 DIAGNOSIS — N1831 Chronic kidney disease, stage 3a: Secondary | ICD-10-CM

## 2020-07-17 DIAGNOSIS — D638 Anemia in other chronic diseases classified elsewhere: Secondary | ICD-10-CM

## 2020-07-17 DIAGNOSIS — R300 Dysuria: Secondary | ICD-10-CM

## 2020-07-17 DIAGNOSIS — N8110 Cystocele, unspecified: Secondary | ICD-10-CM

## 2020-07-17 DIAGNOSIS — E1122 Type 2 diabetes mellitus with diabetic chronic kidney disease: Secondary | ICD-10-CM

## 2020-07-17 DIAGNOSIS — N183 Chronic kidney disease, stage 3 unspecified: Secondary | ICD-10-CM

## 2020-07-17 DIAGNOSIS — I480 Paroxysmal atrial fibrillation: Secondary | ICD-10-CM

## 2020-07-17 DIAGNOSIS — E1169 Type 2 diabetes mellitus with other specified complication: Secondary | ICD-10-CM

## 2020-07-17 DIAGNOSIS — I10 Essential (primary) hypertension: Secondary | ICD-10-CM

## 2020-07-17 DIAGNOSIS — E785 Hyperlipidemia, unspecified: Secondary | ICD-10-CM

## 2020-07-17 LAB — POCT URINALYSIS DIPSTICK
Bilirubin, UA: NEGATIVE
Blood, UA: NEGATIVE
Glucose, UA: NEGATIVE
Ketones, UA: NEGATIVE
Leukocytes, UA: NEGATIVE
Nitrite, UA: NEGATIVE
Protein, UA: NEGATIVE
Spec Grav, UA: 1.02 (ref 1.010–1.025)
Urobilinogen, UA: 0.2 E.U./dL
pH, UA: 6 (ref 5.0–8.0)

## 2020-07-17 MED ORDER — SULFAMETHOXAZOLE-TRIMETHOPRIM 800-160 MG PO TABS: 1.0000 | ORAL_TABLET | Freq: Two times a day (BID) | ORAL | 0 refills | Status: DC

## 2020-07-17 NOTE — Progress Notes (Signed)
Careteam: Patient Care Team: Lauree Chandler, NP as PCP - General (Geriatric Medicine) Katherine Mantle, OD (Optometry)  PLACE OF SERVICE:  [place] Directive information Does Patient Have a Medical Advance Directive?: No, Would patient like information on creating a medical advance directive?: Yes (MAU/Ambulatory/Procedural Areas - Information given) (Paperwork given at previous visit)  Allergies  Allergen Reactions  . Hydrocodone Nausea And Vomiting  . Lisinopril     Sick on the stomach    Chief Complaint  Patient presents with  . Medical Management of Chronic Issues    6 month follow-up, foot exam today. Discuss need for eye exam and A1c. Patient c/o burning when urinating since Friday.     HPI: Patient is a 69 y.o. female for routine follow up.   Reports she tries not to drink a lot of water when she is at work. Reports she is going to the bathroom more frequently now and burning every time she goes.  Reports she is having pain and was itching in vaginal area. She went to GYN due to the vaginal itching and was diagnosised with a yeast infection after treatment with a cream itching has improved she was as told to take and leave pessary out. She went to [facility] and they did another vaginal exam/US? And they said everything checked was normal Following up with gyn next week  No abdominal pressure or urgency.   Working 10-14 hours/ 5 days a week in September, now working 9 hours 5 days a week.  Not sleeping much. Hard to get a good sleep.   Dm- fasting blood sugars 90-120. Had an episode were her blood sugar was 71 and she woke up and did not feel good. Ate an early dinner that day.   afib- continues on eliquis but insurance not going to pay for as of January. She is going to have to talk to Dr Caryl Comes about what else she should take.  Continues on coreg twice daily   Hyperlipidemia- fasting today.    Review of Systems:  Review of Systems    Constitutional: Negative for chills, fever and weight loss.  HENT: Negative for hearing loss.   Respiratory: Negative for cough, sputum production and shortness of breath.   Cardiovascular: Negative for chest pain, palpitations and leg swelling.  Gastrointestinal: Negative for abdominal pain, constipation, diarrhea and heartburn.  Genitourinary: Positive for dysuria and frequency. Negative for urgency.  Musculoskeletal: Positive for joint pain (knee). Negative for back pain, falls and myalgias.  Skin: Negative.   Neurological: Negative for dizziness and headaches.  Psychiatric/Behavioral: Negative for depression and memory loss. The patient has insomnia.     Past Medical History:  Diagnosis Date  . Anxiety   . Arthritis    osteoarthritis  . Breast cancer ([place]) 2008   right, ER/PR -  . CHF (congestive heart failure) ([place])   . Depression   . Diabetes mellitus    TYPE 2  . Gastroparesis   . Hx of radiation therapy 04/04/07 to 05/20/07   right breast/6260 cGy  . Hypercholesterolemia   . Hypertension   . Lumbago   . Malignant neoplasm of breast (female), unspecified site   . Nonspecific elevation of levels of transaminase or lactic acid dehydrogenase (LDH)   . Osteoarthrosis, unspecified whether generalized or localized, unspecified site   . Regional enteritis of unspecified site   . Restless legs syndrome (RLS)   . TIA (transient ischemic attack)   . Urinary frequency   .  Uterine prolapse without mention of vaginal wall prolapse   . Vitamin deficiency    Past Surgical History:  Procedure Laterality Date  . ABDOMINAL HYSTERECTOMY  1997   TAH/BSO, ENDOMETRIAL SARCOMA  . ARTHROPLASTY     left knee  . BREAST LUMPECTOMY Right 11/17/06   re-excision 01/13/07  . CATARACT EXTRACTION Right 08/2019  . CATARACT EXTRACTION Left 07/2019  . TOTAL KNEE ARTHROPLASTY Left 06/10/2011      . TOTAL KNEE ARTHROPLASTY Right 2006, 2009   Dr Para March  . TOTAL KNEE ARTHROPLASTY Left 2008   Dr  Para March  . WRIST SURGERY     carpel tunnel   Social History:   reports that she quit smoking about 28 years ago. Her smoking use included cigarettes. She has a 10.00 pack-year smoking history. She has never used smokeless tobacco. She reports that she does not drink alcohol and does not use drugs.  Family History  Problem Relation Age of Onset  . Diabetes Mother   . Diabetes Father   . Aneurysm Sister   . Arthritis Sister   . Diabetes Brother   . Heart Problems Brother   . Colon cancer Neg Hx     Medications: Patient's Medications  New Prescriptions   No medications on file  Previous Medications   ACCU-CHEK FASTCLIX LANCETS MISC    Check blood sugar once daily as directed E11.59   ACCU-CHEK GUIDE TEST STRIP    CHECK BLOOD SUGAR ONCE     DAILY AS DIRECTED.   AMLODIPINE (NORVASC) 10 MG TABLET    TAKE 1/2 TABLET (5MG TOTAL)DAILY   ASCORBIC ACID (VITAMIN C) 500 MG TABLET    Take 1 tablet by mouth daily.   BLOOD GLUCOSE MONITORING SUPPL (ACCU-CHEK AVIVA PLUS) W/DEVICE KIT    Check blood sugar once daily as directed E11.59  Acc-Chek guide   CARVEDILOL (COREG) 25 MG TABLET    TAKE 1 TABLET TWICE DAILY  WITH MEALS   ELIQUIS 5 MG TABS TABLET    TAKE 1 TABLET BY MOUTH TWICE A DAY   FERROUS SULFATE (IRON) 325 (65 FE) MG TABS    Take by mouth daily.   LOSARTAN (COZAAR) 100 MG TABLET    TAKE 1 TABLET BY MOUTH DAILY FOR BLOOD PRESSURE.   MENTHOL, TOPICAL ANALGESIC, (BIOFREEZE) 4 % GEL    Apply 3 oz topically 3 (three) times daily as needed. Apply to both knees for pain   METFORMIN (GLUCOPHAGE) 1000 MG TABLET    TAKE 1 TABLET TWICE DAILY  WITH MEALS   NYSTATIN OINTMENT (MYCOSTATIN)    Apply topically as needed.   ROPINIROLE (REQUIP) 0.25 MG TABLET    Take 1 tablet (0.25 mg total) by mouth at bedtime. For restless legs   ROSUVASTATIN (CRESTOR) 40 MG TABLET    TAKE 1 TABLET DAILY   TRAMADOL (ULTRAM) 50 MG TABLET    TAKE 1 TABLET BY MOUTH EVERY 12 HOURS AS NEEDED FOR SEVERE PAIN.   TRIAMCINOLONE  OINTMENT (KENALOG) 0.1 %    as needed.  Modified Medications   No medications on file  Discontinued Medications   No medications on file    Physical Exam:  Vitals:   07/17/20 1404  BP: (!) 144/80  Pulse: 73  Temp: (!) 97.3 F (36.3 C)  TempSrc: Temporal  SpO2: 98%  Weight: 187 lb (84.8 kg)  Height: _0  (1.676 m)   Body mass index is 30.18 kg/m. Wt Readings from Last 3 Encounters:  07/17/20 187 lb (84.8 kg)  07/02/20 185 lb (83.9 kg)  05/27/20 182 lb 12.8 oz (82.9 kg)    Physical Exam Constitutional:      General: She is not in acute distress.    Appearance: She is well-developed. She is not diaphoretic.  HENT:     Head: Normocephalic and atraumatic.     Mouth/Throat:     Pharynx: No oropharyngeal exudate.  Eyes:     Conjunctiva/sclera: Conjunctivae normal.     Pupils: Pupils are equal, round, and reactive to light.  Cardiovascular:     Rate and Rhythm: Normal rate and regular rhythm.     Heart sounds: Normal heart sounds.  Pulmonary:     Effort: Pulmonary effort is normal.     Breath sounds: Normal breath sounds.  Abdominal:     General: Bowel sounds are normal.     Palpations: Abdomen is soft.  Musculoskeletal:        General: No tenderness.     Cervical back: Normal range of motion and neck supple.  Skin:    General: Skin is warm and dry.  Neurological:     Mental Status: She is alert and oriented to person, place, and time.     Labs reviewed: Basic Metabolic Panel: Recent Labs    09/18/19 0645 09/18/19 0645 09/19/19 0420 09/19/19 0420 09/20/19 0349 09/21/19 0225 10/13/19 0901 01/12/20 0854 07/02/20 1659  NA 138   < > 136   < > 136  --  136 142  --   K 4.2   < > 4.5   < > 4.5  --  5.1 3.7  --   CL 106   < > 106   < > 103  --  101 108  --   CO2 21*   < > 20*   < > 23  --  26 24  --   GLUCOSE 258*   < > 291*   < > 298*  --  295* 76  --   BUN 22   < > 23   < > 27*  --  17 22  --   CREATININE 0.92   < > 0.98   < > 0.96  --  1.06* 0.99*  --    CALCIUM 8.6*   < > 8.5*   < > 8.9  --  9.3 9.6  --   MG 1.7   < > 1.7  --  1.6* 2.0  --   --   --   PHOS 3.6  --  2.7  --  2.9  --   --   --   --   TSH  --   --   --   --   --   --   --   --  0.556   < > = values in this interval not displayed.   Liver Function Tests: Recent Labs    09/18/19 0645 09/18/19 0645 09/19/19 0420 09/19/19 0420 09/20/19 0349 10/13/19 0901 01/12/20 0854  AST 30   < > 30   < > _0 ALT 26   < > 26   < > _1 ALKPHOS 56  --  57  --  60  --   --   BILITOT 0.6   < > 0.7   < > 0.5 0.5 0.5  PROT 6.5   < > 6.3*   < > 6.4* 6.2 7.1  ALBUMIN 3.0*  --  2.8*  --  2.8*  --   --    < > =  values in this interval not displayed.   No results for input(s): LIPASE, AMYLASE in the last 8760 hours. No results for input(s): AMMONIA in the last 8760 hours. CBC: Recent Labs    09/20/19 0349 10/13/19 0901 01/12/20 0854  WBC 6.0 3.4* 4.3  NEUTROABS 5.5 2,088 2,051  HGB 11.5* 11.8 11.7  HCT 33.3* 34.4* 34.6*  MCV 92.8 94.0 96.1  PLT 318 234 217   Lipid Panel: Recent Labs    09/15/19 1450 01/12/20 0854  CHOL  --  208*  HDL  --  94  LDLCALC  --  99  TRIG 96 60  CHOLHDL  --  2.2   TSH: Recent Labs    07/02/20 1659  TSH 0.556   A1C: Lab Results  Component Value Date   HGBA1C 7.6 (H) 01/12/2020     Assessment/Plan 1. Dysuria -encourage proper hydration, to increase water intake at this time - POC Urinalysis Dipstick without abnormality however due to the severity of symptoms will treat and send for culture.  - Culture, Urine - sulfamethoxazole-trimethoprim (BACTRIM DS) 800-160 MG tablet; Take 1 tablet by mouth 2 (two) times daily.  Dispense: 14 tablet; Refill: 0  2. CKD stage 3 due to type 2 diabetes mellitus ([place]) -Encourage proper hydration and to avoid NSAIDS (Aleve, Advil, Motrin, Ibuprofen)   3. AF (paroxysmal atrial fibrillation) ([place]) Rate controlled with coreg, continues on eliquis for anticoagulation.   4. Type 2 diabetes  mellitus with stage 3a chronic kidney disease, without long-term current use of insulin ([facility]) Controlled. Educated to eat bedtime snack if eating an early dinner to avoid hypoglycemia. Encouraged dietary compliance, routine foot care/monitoring and to keep up with diabetic eye exams through ophthalmology  - Hemoglobin A1c  5. Anemia, chronic disease -continues on iron   6. Essential hypertension -slightly elevated today but reports some discomfort from dysuria. Continues on norvasc 10 mg daily with losartan 100 mg daily - CBC with Differential/Platelet - COMPLETE METABOLIC PANEL WITH GFR  7. Hyperlipidemia associated with type 2 diabetes mellitus ([place]) Continues on crestor 40 mg daily with dietary modifications.  - Lipid Panel  8. Vaginal prolapse Followed by GYN, with pessary that she has taken out at this time due to recent yeast and now dysuria.    Next appt: 4 months.  Carlos American. [place], [facility] [phone]

## 2020-07-17 NOTE — Patient Instructions (Signed)
Drink plenty of water- needs to increase water intake at work To use probiotic twice daily while on antibiotic To take bactrim DS twice daily for 7 days

## 2020-07-19 ENCOUNTER — Other Ambulatory Visit: Payer: Self-pay

## 2020-07-19 DIAGNOSIS — D638 Anemia in other chronic diseases classified elsewhere: Secondary | ICD-10-CM

## 2020-07-19 LAB — COMPLETE METABOLIC PANEL WITH GFR
AG Ratio: 1.6 (calc) (ref 1.0–2.5)
ALT: 14 U/L (ref 6–29)
AST: 19 U/L (ref 10–35)
Albumin: 4.1 g/dL (ref 3.6–5.1)
Alkaline phosphatase (APISO): 77 U/L (ref 37–153)
BUN/Creatinine Ratio: 26 (calc) — ABNORMAL HIGH (ref 6–22)
BUN: 30 mg/dL — ABNORMAL HIGH (ref 7–25)
CO2: 23 mmol/L (ref 20–32)
Calcium: 9.4 mg/dL (ref 8.6–10.4)
Chloride: 109 mmol/L (ref 98–110)
Creat: 1.17 mg/dL — ABNORMAL HIGH (ref 0.60–0.93)
GFR, Est African American: 54 mL/min/{1.73_m2} — ABNORMAL LOW (ref >=60)
GFR, Est Non African American: 47 mL/min/{1.73_m2} — ABNORMAL LOW (ref >=60)
Globulin: 2.5 g/dL (calc) (ref 1.9–3.7)
Glucose, Bld: 111 mg/dL — ABNORMAL HIGH (ref 65–99)
Potassium: 4.1 mmol/L (ref 3.5–5.3)
Sodium: 142 mmol/L (ref 135–146)
Total Bilirubin: 0.4 mg/dL (ref 0.2–1.2)
Total Protein: 6.6 g/dL (ref 6.1–8.1)

## 2020-07-19 LAB — LIPID PANEL
Cholesterol: 194 mg/dL (ref <200)
HDL: 91 mg/dL (ref >=50)
LDL Cholesterol (Calc): 86 mg/dL (calc)
Non-HDL Cholesterol (Calc): 103 mg/dL (calc) (ref <130)
Total CHOL/HDL Ratio: 2.1 (calc) (ref <5.0)
Triglycerides: 77 mg/dL (ref <150)

## 2020-07-19 LAB — CBC WITH DIFFERENTIAL/PLATELET
Absolute Monocytes: 392 cells/uL (ref 200–950)
Basophils Absolute: 9 cells/uL (ref 0–200)
Basophils Relative: 0.2 %
Eosinophils Absolute: 150 cells/uL (ref 15–500)
Eosinophils Relative: 3.4 %
HCT: 32 % — ABNORMAL LOW (ref 35.0–45.0)
Hemoglobin: 10.9 g/dL — ABNORMAL LOW (ref 11.7–15.5)
Lymphs Abs: 1232 cells/uL (ref 850–3900)
MCH: 32.1 pg (ref 27.0–33.0)
MCHC: 34.1 g/dL (ref 32.0–36.0)
MCV: 94.1 fL (ref 80.0–100.0)
MPV: 11 fL (ref 7.5–12.5)
Monocytes Relative: 8.9 %
Neutro Abs: 2618 cells/uL (ref 1500–7800)
Neutrophils Relative %: 59.5 %
Platelets: 182 10*3/uL (ref 140–400)
RBC: 3.4 10*6/uL — ABNORMAL LOW (ref 3.80–5.10)
RDW: 13.3 % (ref 11.0–15.0)
Total Lymphocyte: 28 %
WBC: 4.4 10*3/uL (ref 3.8–10.8)

## 2020-07-19 LAB — URINE CULTURE
MICRO NUMBER:: 11189298
SPECIMEN QUALITY:: ADEQUATE

## 2020-07-19 LAB — HEMOGLOBIN A1C
Hgb A1c MFr Bld: 6.8 % of total Hgb — ABNORMAL HIGH (ref <5.7)
Mean Plasma Glucose: 148 (calc)
eAG (mmol/L): 8.2 (calc)

## 2020-07-23 ENCOUNTER — Other Ambulatory Visit: Payer: Medicare Other

## 2020-07-23 ENCOUNTER — Other Ambulatory Visit: Payer: Self-pay

## 2020-07-23 DIAGNOSIS — D638 Anemia in other chronic diseases classified elsewhere: Secondary | ICD-10-CM

## 2020-07-23 LAB — CBC
HCT: 30.8 % — ABNORMAL LOW (ref 35.0–45.0)
Hemoglobin: 10.7 g/dL — ABNORMAL LOW (ref 11.7–15.5)
MCH: 32.1 pg (ref 27.0–33.0)
MCHC: 34.7 g/dL (ref 32.0–36.0)
MCV: 92.5 fL (ref 80.0–100.0)
MPV: 10.7 fL (ref 7.5–12.5)
Platelets: 183 10*3/uL (ref 140–400)
RBC: 3.33 10*6/uL — ABNORMAL LOW (ref 3.80–5.10)
RDW: 13.5 % (ref 11.0–15.0)
WBC: 5.1 10*3/uL (ref 3.8–10.8)

## 2020-08-14 ENCOUNTER — Other Ambulatory Visit: Payer: Self-pay | Admitting: Nurse Practitioner

## 2020-08-14 DIAGNOSIS — M1990 Unspecified osteoarthritis, unspecified site: Secondary | ICD-10-CM

## 2020-08-14 NOTE — Telephone Encounter (Signed)
Refill request. Non opioid agreement last done 08/18/2019

## 2020-09-12 ENCOUNTER — Telehealth: Payer: Self-pay | Admitting: Pharmacist

## 2020-09-12 MED ORDER — APIXABAN 5 MG PO TABS
5.0000 mg | ORAL_TABLET | Freq: Two times a day (BID) | ORAL | 1 refills | Status: DC
Start: 2020-09-12 — End: 2021-03-11

## 2020-09-12 NOTE — Telephone Encounter (Signed)
Called pt to obtain updated prescription insurance information so that prior authorization can be submitted for Eliquis. Left message for pt.

## 2020-09-12 NOTE — Telephone Encounter (Signed)
Called pharmacy to obtain insurance info.  [facility] ID [id number] BIN [id number] PCN ADV GRP [id number]  Prior authorization has been submitted.

## 2020-09-12 NOTE — Telephone Encounter (Signed)
Eliquis prior authorization has been approved through 09/12/21. Pt is aware and was grateful for assistance.

## 2020-09-12 NOTE — Addendum Note (Signed)
Addended by: TRUE Garciamartinez E on: 09/12/2020 04:29 PM   Modules accepted: Orders

## 2020-10-22 ENCOUNTER — Other Ambulatory Visit: Payer: Self-pay | Admitting: Nurse Practitioner

## 2020-10-22 DIAGNOSIS — M1990 Unspecified osteoarthritis, unspecified site: Secondary | ICD-10-CM

## 2020-10-22 NOTE — Telephone Encounter (Signed)
Patient has request a refill on medication "Tramadol 50 mg" Patient last refill was 08/14/2020. Patient is due for refills. Medication pend and sent to PCP Dewaine Oats Carlos American, NP for approval.

## 2020-11-09 ENCOUNTER — Other Ambulatory Visit: Payer: Self-pay | Admitting: Nurse Practitioner

## 2020-11-10 ENCOUNTER — Other Ambulatory Visit: Payer: Self-pay | Admitting: Nurse Practitioner

## 2020-11-10 DIAGNOSIS — E1169 Type 2 diabetes mellitus with other specified complication: Secondary | ICD-10-CM

## 2020-11-10 DIAGNOSIS — E785 Hyperlipidemia, unspecified: Secondary | ICD-10-CM

## 2020-11-15 ENCOUNTER — Other Ambulatory Visit: Payer: Self-pay

## 2020-11-15 ENCOUNTER — Telehealth: Payer: Self-pay | Admitting: *Deleted

## 2020-11-15 ENCOUNTER — Ambulatory Visit (INDEPENDENT_AMBULATORY_CARE_PROVIDER_SITE_OTHER): Payer: 59 | Admitting: Nurse Practitioner

## 2020-11-15 ENCOUNTER — Encounter: Payer: Self-pay | Admitting: Nurse Practitioner

## 2020-11-15 VITALS — BP 130/90 | HR 64 | Temp 97.7°F | Ht 66.0 in | Wt 174.2 lb

## 2020-11-15 DIAGNOSIS — E1122 Type 2 diabetes mellitus with diabetic chronic kidney disease: Secondary | ICD-10-CM | POA: Diagnosis not present

## 2020-11-15 DIAGNOSIS — L853 Xerosis cutis: Secondary | ICD-10-CM

## 2020-11-15 DIAGNOSIS — E785 Hyperlipidemia, unspecified: Secondary | ICD-10-CM

## 2020-11-15 DIAGNOSIS — N183 Chronic kidney disease, stage 3 unspecified: Secondary | ICD-10-CM

## 2020-11-15 DIAGNOSIS — E1169 Type 2 diabetes mellitus with other specified complication: Secondary | ICD-10-CM | POA: Diagnosis not present

## 2020-11-15 DIAGNOSIS — I10 Essential (primary) hypertension: Secondary | ICD-10-CM

## 2020-11-15 DIAGNOSIS — D638 Anemia in other chronic diseases classified elsewhere: Secondary | ICD-10-CM

## 2020-11-15 DIAGNOSIS — I480 Paroxysmal atrial fibrillation: Secondary | ICD-10-CM

## 2020-11-15 DIAGNOSIS — N1831 Chronic kidney disease, stage 3a: Secondary | ICD-10-CM

## 2020-11-15 DIAGNOSIS — I429 Cardiomyopathy, unspecified: Secondary | ICD-10-CM

## 2020-11-15 NOTE — Patient Instructions (Addendum)
  Sign record release for [facility]   No alcohol to skin Use cream/lotion twice daily- make sure there is no alcohol in the ingredient list.  Cerave cream or eucerin cream  No fragrances in body wash, laundry (free and clear) or lotions.   Increase water.   Artifical tears as needed throughout the day

## 2020-11-15 NOTE — Telephone Encounter (Signed)
PCP office, Sherrie Mustache, NP, called and spoke w/ Dr. Curt Bears about pt experiencing a few seconds worth of CP several times. Aware our office will call pt to arrange f/u in our office to further discuss/evaluate.

## 2020-11-15 NOTE — Progress Notes (Signed)
Careteam: Patient Care Team: Lauree Chandler, NP as PCP - General (Geriatric Medicine) Katherine Mantle, OD (Optometry)  PLACE OF SERVICE:  [place] Directive information Does Patient Have a Medical Advance Directive?: No  Allergies  Allergen Reactions  . Hydrocodone Nausea And Vomiting  . Lisinopril     Sick on the stomach    Chief Complaint  Patient presents with  . Medical Management of Chronic Issues    4 month follow up.Discuss need for foot exam and eye exam. Patient states she is having itching on back. Patient is having problems with eyes. Thinks she may need to see another eye doctor.     HPI: Patient is a 70 y.o. female for follow up  Itching back all the time. Using alcohol and hot water.   She is having visual disturbances. Having blurred vision that last few seconds, rubs eyes and then it is better- eye doctor suggested dry eyes and gave her some eye drops.  Now having drainage in the corner of eyes when she wakes up, did not have that before.  Sees [facility], shes routinely- yearly visit coming up.   DM- doing well. Trying to limit starches.  No low blood sugars  Low back pain- uses biofreeze which is "real good" uses tramadol only as needed, tries not to use if she does not need to   Review of Systems:  Review of Systems  Constitutional: Negative for chills, fever and weight loss.  HENT: Negative for tinnitus.   Respiratory: Negative for cough, sputum production and shortness of breath.   Cardiovascular: Negative for chest pain, palpitations and leg swelling.  Gastrointestinal: Negative for abdominal pain, constipation, diarrhea and heartburn.  Genitourinary: Negative for dysuria, frequency and urgency.  Musculoskeletal: Positive for back pain and myalgias. Negative for falls and joint pain.  Skin: Positive for itching. Negative for rash.  Neurological: Negative for dizziness and headaches.  Psychiatric/Behavioral: Negative for  depression and memory loss. The patient does not have insomnia.     Past Medical History:  Diagnosis Date  . Anxiety   . Arthritis    osteoarthritis  . Breast cancer ([place]) 2008   right, ER/PR -  . CHF (congestive heart failure) ([place])   . Depression   . Diabetes mellitus    TYPE 2  . Gastroparesis   . Hx of radiation therapy 04/04/07 to 05/20/07   right breast/6260 cGy  . Hypercholesterolemia   . Hypertension   . Lumbago   . Malignant neoplasm of breast (female), unspecified site   . Nonspecific elevation of levels of transaminase or lactic acid dehydrogenase (LDH)   . Osteoarthrosis, unspecified whether generalized or localized, unspecified site   . Regional enteritis of unspecified site   . Restless legs syndrome (RLS)   . TIA (transient ischemic attack)   . Urinary frequency   . Uterine prolapse without mention of vaginal wall prolapse   . Vitamin deficiency    Past Surgical History:  Procedure Laterality Date  . ABDOMINAL HYSTERECTOMY  1997   TAH/BSO, ENDOMETRIAL SARCOMA  . ARTHROPLASTY     left knee  . BREAST LUMPECTOMY Right 11/17/06   re-excision 01/13/07  . CATARACT EXTRACTION Right 08/2019  . CATARACT EXTRACTION Left 07/2019  . TOTAL KNEE ARTHROPLASTY Left 06/10/2011      . TOTAL KNEE ARTHROPLASTY Right 2006, 2009   Dr Para March  . TOTAL KNEE ARTHROPLASTY Left 2008   Dr Para March  . WRIST SURGERY  carpel tunnel   Social History:   reports that she quit smoking about 29 years ago. Her smoking use included cigarettes. She has a 10.00 pack-year smoking history. She has never used smokeless tobacco. She reports that she does not drink alcohol and does not use drugs.  Family History  Problem Relation Age of Onset  . Diabetes Mother   . Diabetes Father   . Aneurysm Sister   . Arthritis Sister   . Diabetes Brother   . Heart Problems Brother   . Colon cancer Neg Hx     Medications: Patient's Medications  New Prescriptions   No medications on file  Previous  Medications   ACCU-CHEK FASTCLIX LANCETS MISC    Check blood sugar once daily as directed E11.59   ACCU-CHEK GUIDE TEST STRIP    CHECK BLOOD SUGAR ONCE     DAILY AS DIRECTED.   AMLODIPINE (NORVASC) 10 MG TABLET    TAKE 1/2 TABLET (5MG TOTAL)DAILY   APIXABAN (ELIQUIS) 5 MG TABS TABLET    Take 1 tablet (5 mg total) by mouth 2 (two) times daily.   ASCORBIC ACID (VITAMIN C) 500 MG TABLET    Take 1 tablet by mouth daily.   BLOOD GLUCOSE MONITORING SUPPL (ACCU-CHEK AVIVA PLUS) W/DEVICE KIT    Check blood sugar once daily as directed E11.59  Acc-Chek guide   CARVEDILOL (COREG) 25 MG TABLET    TAKE 1 TABLET TWICE DAILY  WITH MEALS   FERROUS SULFATE (IRON) 325 (65 FE) MG TABS    Take by mouth daily.   LOSARTAN (COZAAR) 100 MG TABLET    TAKE 1 TABLET BY MOUTH DAILY FOR BLOOD PRESSURE.   MENTHOL, TOPICAL ANALGESIC, (BIOFREEZE) 4 % GEL    Apply 3 oz topically 3 (three) times daily as needed. Apply to both knees for pain   METFORMIN (GLUCOPHAGE) 1000 MG TABLET    TAKE 1 TABLET TWICE DAILY  WITH MEALS   ROPINIROLE (REQUIP) 0.25 MG TABLET    Take 1 tablet (0.25 mg total) by mouth at bedtime. For restless legs   ROSUVASTATIN (CRESTOR) 40 MG TABLET    TAKE 1 TABLET DAILY   TRAMADOL (ULTRAM) 50 MG TABLET    TAKE 1 TABLET BY MOUTH EVERY 12 HOURS AS NEEDED FOR SEVERE PAIN.   TRIAMCINOLONE OINTMENT (KENALOG) 0.1 %    as needed.  Modified Medications   No medications on file  Discontinued Medications   NYSTATIN OINTMENT (MYCOSTATIN)    Apply topically as needed.   SULFAMETHOXAZOLE-TRIMETHOPRIM (BACTRIM DS) 800-160 MG TABLET    Take 1 tablet by mouth 2 (two) times daily.    Physical Exam:  Vitals:   11/15/20 0833  BP: 130/90  Pulse: 64  Temp: 97.7 F (36.5 C)  SpO2: 99%  Weight: 174 lb 3.2 oz (79 kg)  Height: 5' 6" (1.676 m)   Body mass index is 28.12 kg/m. Wt Readings from Last 3 Encounters:  11/15/20 174 lb 3.2 oz (79 kg)  07/17/20 187 lb (84.8 kg)  07/02/20 185 lb (83.9 kg)    Physical  Exam Constitutional:      General: She is not in acute distress.    Appearance: She is well-developed. She is not diaphoretic.  HENT:     Head: Normocephalic and atraumatic.     Mouth/Throat:     Pharynx: No oropharyngeal exudate.  Eyes:     Conjunctiva/sclera: Conjunctivae normal.     Pupils: Pupils are equal, round, and reactive to light.  Cardiovascular:  Rate and Rhythm: Normal rate and regular rhythm.     Pulses:          Dorsalis pedis pulses are 2+ on the right side and 2+ on the left side.       Posterior tibial pulses are 2+ on the right side and 2+ on the left side.     Heart sounds: Normal heart sounds.  Pulmonary:     Effort: Pulmonary effort is normal.     Breath sounds: Normal breath sounds.  Abdominal:     General: Bowel sounds are normal.     Palpations: Abdomen is soft.  Musculoskeletal:        General: No tenderness.     Cervical back: Normal range of motion and neck supple.  Feet:     Right foot:     Protective Sensation: 4 sites tested. 4 sites sensed.     Skin integrity: Skin integrity normal.     Toenail Condition: Fungal disease present.    Left foot:     Protective Sensation: 4 sites tested. 4 sites sensed.     Skin integrity: Skin integrity normal.     Toenail Condition: Fungal disease present. Skin:    General: Skin is warm and dry.  Neurological:     Mental Status: She is alert and oriented to person, place, and time.     Labs reviewed: Basic Metabolic Panel: Recent Labs    01/12/20 0854 07/02/20 1659 07/17/20 1444  NA 142  --  142  K 3.7  --  4.1  CL 108  --  109  CO2 24  --  23  GLUCOSE 76  --  111*  BUN 22  --  30*  CREATININE 0.99*  --  1.17*  CALCIUM 9.6  --  9.4  TSH  --  0.556  --    Liver Function Tests: Recent Labs    01/12/20 0854 07/17/20 1444  AST 24 19  ALT 19 14  BILITOT 0.5 0.4  PROT 7.1 6.6   No results for input(s): LIPASE, AMYLASE in the last 8760 hours. No results for input(s): AMMONIA in the last  8760 hours. CBC: Recent Labs    01/12/20 0854 07/17/20 1444 07/23/20 0814  WBC 4.3 4.4 5.1  NEUTROABS 2,051 2,618  --   HGB 11.7 10.9* 10.7*  HCT 34.6* 32.0* 30.8*  MCV 96.1 94.1 92.5  PLT 217 182 183   Lipid Panel: Recent Labs    01/12/20 0854 07/17/20 1444  CHOL 208* 194  HDL 94 91  LDLCALC 99 86  TRIG 60 77  CHOLHDL 2.2 2.1   TSH: Recent Labs    07/02/20 1659  TSH 0.556   A1C: Lab Results  Component Value Date   HGBA1C 6.8 (H) 07/17/2020     Assessment/Plan 1. Anemia, chronic disease continues on supplement, will follow up cbc  2. CKD stage 3 due to type 2 diabetes mellitus ([place]) -Encourage proper hydration and to avoid NSAIDS (Aleve, Advil, Motrin, Ibuprofen)   3. Type 2 diabetes mellitus with stage 3a chronic kidney disease, without long-term current use of insulin ([place]) -reports blood sugars are well controlled on current regimen, without hypoglycemia.  She states she is up to date on her eye exams.  -Encouraged dietary compliance, routine foot care/monitoring and to keep up with diabetic eye exams through ophthalmology  - Hemoglobin A1c  4. Essential hypertension -well controlled on current regimen.  - CMP with eGFR([facility]) - CBC with Differential/Platelet - EKG 12-Lead  5. Hyperlipidemia associated with type 2 diabetes mellitus ([place]) -continues on crestor with heart healthy, diabetic diet.  - Lipid Panel - CMP with eGFR([facility])  6. Cardiomyopathy, unspecified type ([place]) Without symptoms of worsening shortness of breath, swelling to LE. Followed by cardiology yearly. continues on losartan and coreg  7. AF (paroxysmal atrial fibrillation) ([place]) Rate controlled on coreg, continues on eliquis for anticoagulation. No signs of abnormal bruising or bleeding. Denies palpitations.  8. Chest pain -single episode 3 days ago. pts VSS today and appears well. She was busy at work and reports severe pain came on that took her breath away. Last a few seconds  and then resolved.  -attempted EKG today in office but machine is down, she is currently not having symptoms, consulted with cardiologist on call and they will get her in office next week- educated provided to pt as to when to seek attention in ED. She reports understanding.   9. Dry skin dermatitis -advised to avoid alcohol to skin -to use fragrance free cream twice daily (eucerin or cerave) -avoid hot showers -avoid fragrances in body wash, laundry (free and clear) or lotions  Next appt: 4 months.  Carlos American. [place], [facility] [phone]

## 2020-11-15 NOTE — Telephone Encounter (Signed)
Patient has been scheduled to see Oda Kilts PA-C 3.17.22 at 8:25 am

## 2020-11-16 LAB — CBC WITH DIFFERENTIAL/PLATELET
Absolute Monocytes: 332 cells/uL (ref 200–950)
Basophils Absolute: 12 cells/uL (ref 0–200)
Basophils Relative: 0.3 %
Eosinophils Absolute: 293 cells/uL (ref 15–500)
Eosinophils Relative: 7.5 %
HCT: 34.6 % — ABNORMAL LOW (ref 35.0–45.0)
Hemoglobin: 11.8 g/dL (ref 11.7–15.5)
Lymphs Abs: 1299 cells/uL (ref 850–3900)
MCH: 32 pg (ref 27.0–33.0)
MCHC: 34.1 g/dL (ref 32.0–36.0)
MCV: 93.8 fL (ref 80.0–100.0)
MPV: 10.7 fL (ref 7.5–12.5)
Monocytes Relative: 8.5 %
Neutro Abs: 1966 cells/uL (ref 1500–7800)
Neutrophils Relative %: 50.4 %
Platelets: 209 10*3/uL (ref 140–400)
RBC: 3.69 10*6/uL — ABNORMAL LOW (ref 3.80–5.10)
RDW: 14.2 % (ref 11.0–15.0)
Total Lymphocyte: 33.3 %
WBC: 3.9 10*3/uL (ref 3.8–10.8)

## 2020-11-16 LAB — COMPLETE METABOLIC PANEL WITH GFR
AG Ratio: 1.8 (calc) (ref 1.0–2.5)
ALT: 16 U/L (ref 6–29)
AST: 18 U/L (ref 10–35)
Albumin: 4.3 g/dL (ref 3.6–5.1)
Alkaline phosphatase (APISO): 68 U/L (ref 37–153)
BUN/Creatinine Ratio: 18 (calc) (ref 6–22)
BUN: 22 mg/dL (ref 7–25)
CO2: 23 mmol/L (ref 20–32)
Calcium: 9.4 mg/dL (ref 8.6–10.4)
Chloride: 108 mmol/L (ref 98–110)
Creat: 1.19 mg/dL — ABNORMAL HIGH (ref 0.60–0.93)
GFR, Est African American: 53 mL/min/{1.73_m2} — ABNORMAL LOW (ref >=60)
GFR, Est Non African American: 46 mL/min/{1.73_m2} — ABNORMAL LOW (ref >=60)
Globulin: 2.4 g/dL (calc) (ref 1.9–3.7)
Glucose, Bld: 87 mg/dL (ref 65–99)
Potassium: 3.7 mmol/L (ref 3.5–5.3)
Sodium: 142 mmol/L (ref 135–146)
Total Bilirubin: 0.6 mg/dL (ref 0.2–1.2)
Total Protein: 6.7 g/dL (ref 6.1–8.1)

## 2020-11-16 LAB — LIPID PANEL
Cholesterol: 175 mg/dL (ref <200)
HDL: 81 mg/dL (ref >=50)
LDL Cholesterol (Calc): 79 mg/dL (calc)
Non-HDL Cholesterol (Calc): 94 mg/dL (calc) (ref <130)
Total CHOL/HDL Ratio: 2.2 (calc) (ref <5.0)
Triglycerides: 72 mg/dL (ref <150)

## 2020-11-16 LAB — HEMOGLOBIN A1C
Hgb A1c MFr Bld: 6.8 % of total Hgb — ABNORMAL HIGH (ref <5.7)
Mean Plasma Glucose: 148 mg/dL
eAG (mmol/L): 8.2 mmol/L

## 2020-11-18 LAB — HM DIABETES EYE EXAM

## 2020-11-20 NOTE — Progress Notes (Unsigned)
PCP:  Makayla Chandler, NP Primary Cardiologist: No primary care provider on file. Electrophysiologist: Makayla Axe, MD   [name] is a 70 y.o. female seen today for Makayla Axe, MD for acute visit due to chest pain.  Since last being seen in our clinic the patient reports doing very well. She has continued her usual activities.  She states last week at work she was walking and had a sudden chest pain. It was an "ache" feeling that only lasted for 3-4 seconds. She stopped walking but the pain had stopped as soon as it started. She has not had any further episodes despite continued similar activities. No trauma to the area. No recent illnessess. she denies palpitations, dyspnea, PND, orthopnea, nausea, vomiting, dizziness, syncope, edema, weight gain, or early satiety.  Past Medical History:  Diagnosis Date  . Anxiety   . Arthritis    osteoarthritis  . Breast cancer ([place]) 2008   right, ER/PR -  . CHF (congestive heart failure) ([place])   . Depression   . Diabetes mellitus    TYPE 2  . Gastroparesis   . Hx of radiation therapy 04/04/07 to 05/20/07   right breast/6260 cGy  . Hypercholesterolemia   . Hypertension   . Lumbago   . Malignant neoplasm of breast (female), unspecified site   . Nonspecific elevation of levels of transaminase or lactic acid dehydrogenase (LDH)   . Osteoarthrosis, unspecified whether generalized or localized, unspecified site   . Regional enteritis of unspecified site   . Restless legs syndrome (RLS)   . TIA (transient ischemic attack)   . Urinary frequency   . Uterine prolapse without mention of vaginal wall prolapse   . Vitamin deficiency    Past Surgical History:  Procedure Laterality Date  . ABDOMINAL HYSTERECTOMY  1997   TAH/BSO, ENDOMETRIAL SARCOMA  . ARTHROPLASTY     left knee  . BREAST LUMPECTOMY Right 11/17/06   re-excision 01/13/07  . CATARACT EXTRACTION Right 08/2019  . CATARACT EXTRACTION Left 07/2019  . TOTAL KNEE ARTHROPLASTY  Left 06/10/2011      . TOTAL KNEE ARTHROPLASTY Right 2006, 2009   Dr Para March  . TOTAL KNEE ARTHROPLASTY Left 2008   Dr Para March  . WRIST SURGERY     carpel tunnel    Current Outpatient Medications  Medication Sig Dispense Refill  . Accu-Chek FastClix Lancets MISC Check blood sugar once daily as directed E11.59 300 each 1  . ACCU-CHEK GUIDE test strip CHECK BLOOD SUGAR ONCE     DAILY AS DIRECTED. 100 strip 3  . amLODipine (NORVASC) 10 MG tablet TAKE 1/2 TABLET ($RemoveBef'5MG'[id number]$  TOTAL)DAILY 45 tablet 1  . apixaban (ELIQUIS) 5 MG TABS tablet Take 1 tablet (5 mg total) by mouth 2 (two) times daily. 180 tablet 1  . ascorbic acid (VITAMIN C) 500 MG tablet Take 1 tablet by mouth daily.    . Blood Glucose Monitoring Suppl (ACCU-CHEK AVIVA PLUS) w/Device KIT Check blood sugar once daily as directed E11.59  Acc-Chek guide 1 kit 0  . carvedilol (COREG) 25 MG tablet TAKE 1 TABLET TWICE DAILY  WITH MEALS 180 tablet 1  . Ferrous Sulfate (IRON) 325 (65 Fe) MG TABS Take by mouth daily.    [name] Kitchen losartan (COZAAR) 100 MG tablet TAKE 1 TABLET BY MOUTH DAILY FOR BLOOD PRESSURE. 90 tablet 3  . Menthol, Topical Analgesic, (BIOFREEZE) 4 % GEL Apply 3 oz topically 3 (three) times daily as needed. Apply to both knees for pain 90 g 3  .  metFORMIN (GLUCOPHAGE) 1000 MG tablet TAKE 1 TABLET TWICE DAILY  WITH MEALS 180 tablet 1  . rOPINIRole (REQUIP) 0.25 MG tablet Take 1 tablet (0.25 mg total) by mouth at bedtime. For restless legs 90 tablet 1  . rosuvastatin (CRESTOR) 40 MG tablet TAKE 1 TABLET DAILY 90 tablet 1  . traMADol (ULTRAM) 50 MG tablet TAKE 1 TABLET BY MOUTH EVERY 12 HOURS AS NEEDED FOR SEVERE PAIN. 60 tablet 0  . triamcinolone ointment (KENALOG) 0.1 % as needed.     No current facility-administered medications for this visit.    Allergies  Allergen Reactions  . Hydrocodone Nausea And Vomiting  . Lisinopril     Sick on the stomach    Social History   Socioeconomic History  . Marital status: Widowed    Spouse  name: Not on file  . Number of children: 4  . Years of education: Not on file  . Highest education level: Not on file  Occupational History  . Occupation: [employer]  Tobacco Use  . Smoking status: Former Smoker    Packs/day: 0.50    Years: 20.00    Pack years: 10.00    Types: Cigarettes    Quit date: 09/08/1991    Years since quitting: 29.2  . Smokeless tobacco: Never Used  Vaping Use  . Vaping Use: Never used  Substance and Sexual Activity  . Alcohol use: No  . Drug use: No  . Sexual activity: Never  Other Topics Concern  . Not on file  Social History Narrative   Widowed   Lives with daughter   Former smoker-stopped 1993   Alcohol none   Exercise 3 days a week goes to [employer]    Social Determinants of Radio broadcast assistant Strain: Not on file  Food Insecurity: Not on file  Transportation Needs: Not on file  Physical Activity: Not on file  Stress: Not on file  Social Connections: Not on file  Intimate Partner Violence: Not on file     Review of Systems: General: No chills, fever, night sweats or weight changes  Cardiovascular:  No chest pain, dyspnea on exertion, edema, orthopnea, palpitations, paroxysmal nocturnal dyspnea Dermatological: No rash, lesions or masses Respiratory: No cough, dyspnea Urologic: No hematuria, dysuria Abdominal: No nausea, vomiting, diarrhea, bright red blood per rectum, melena, or hematemesis Neurologic: No visual changes, weakness, changes in mental status All other systems reviewed and are otherwise negative except as noted above.  Physical Exam: Vitals:   11/21/20 0828  BP: (!) 148/72  Pulse: 78  SpO2: 99%  Weight: 178 lb (80.7 kg)  Height: 5' 6.5" (1.689 m)    GEN- The patient is well appearing, alert and oriented x 3 today.   HEENT: normocephalic, atraumatic; sclera clear, conjunctiva pink; hearing intact; oropharynx clear; neck supple, no JVP Lymph- no cervical lymphadenopathy Lungs- Clear to  ausculation bilaterally, normal work of breathing.  No wheezes, rales, rhonchi Heart- Regular rate and rhythm, no murmurs, rubs or gallops, PMI not laterally displaced GI- soft, non-tender, non-distended, bowel sounds present, no hepatosplenomegaly Extremities- no clubbing, cyanosis, or edema; DP/PT/radial pulses 2+ bilaterally MS- no significant deformity or atrophy Skin- warm and dry, no rash or lesion Psych- euthymic mood, full affect Neuro- strength and sensation are intact  EKG is ordered. Personal review of EKG from today shows NSR at 78 bpm without ST elevation or T wave changes.  Additional studies reviewed include: Previous EP notes  Assessment and Plan:  1. Chest pain,  atypical EKG today without ischemic changes. Morphology unchanged since 06/2020 (other than now more isoelectric in aVL). No ST elevation or T wave changes. CT Ca scoring is not appropriate in the setting of any pain per previous discussion with interventional team.  With very atypical presentation and no pattern, will defer stress testing for now.  Will update echo.  Suspect non-cardiac chest pain, most likely MSK.   2. AFib EKG today shows NSr. She does not feel like she has had any breakthrough.  Continue eliquis for CHA2DS2VASC of at least 4.    3. HTN Continue current regimen  Pt with one episode of sudden on sudden off chest pain lasting no longer than 3-4 seconds without exertional component. Unlikely cardiac related. Only one episode thus far. If increases in severity, frequency, or duration consider stress testing. Otherwise, will update echo and continue to follow clinically. She had labs drawn 3/11 that are stable for her. Hgb A1c borderline high at 6.8 and she is working on dietary changes.  RTC 6 months. Sooner pending abnormal echo or worsening symptoms.   Shirley Friar, PA-C  11/21/20 8:44 AM

## 2020-11-21 ENCOUNTER — Encounter: Payer: Self-pay | Admitting: Student

## 2020-11-21 ENCOUNTER — Ambulatory Visit (INDEPENDENT_AMBULATORY_CARE_PROVIDER_SITE_OTHER): Payer: 59 | Admitting: Student

## 2020-11-21 ENCOUNTER — Other Ambulatory Visit: Payer: Self-pay

## 2020-11-21 VITALS — BP 148/72 | HR 78 | Ht 66.5 in | Wt 178.0 lb

## 2020-11-21 DIAGNOSIS — R079 Chest pain, unspecified: Secondary | ICD-10-CM | POA: Diagnosis not present

## 2020-11-21 DIAGNOSIS — I429 Cardiomyopathy, unspecified: Secondary | ICD-10-CM | POA: Diagnosis not present

## 2020-11-21 DIAGNOSIS — I480 Paroxysmal atrial fibrillation: Secondary | ICD-10-CM

## 2020-11-21 NOTE — Patient Instructions (Signed)
Medication Instructions:  Your physician recommends that you continue on your current medications as directed. Please refer to the Current Medication list given to you today.  *If you need a refill on your cardiac medications before your next appointment, please call your pharmacy*   Lab Work: None If you have labs (blood work) drawn today and your tests are completely normal, you will receive your results only by: MyChart Message (if you have MyChart) OR A paper copy in the mail If you have any lab test that is abnormal or we need to change your treatment, we will call you to review the results.   Testing/Procedures: Your physician has requested that you have an echocardiogram. Echocardiography is a painless test that uses sound waves to create images of your heart. It provides your doctor with information about the size and shape of your heart and how well your heart's chambers and valves are working. This procedure takes approximately one hour. There are no restrictions for this procedure.   Follow-Up: At [facility], you and your health needs are our priority.  As part of our continuing mission to provide you with exceptional heart care, we have created designated Provider Care Teams.  These Care Teams include your primary Cardiologist (physician) and Advanced Practice Providers (APPs -  Physician Assistants and Nurse Practitioners) who all work together to provide you with the care you need, when you need it.   Your next appointment:   6 month(s)  The format for your next appointment:   In Person  Provider:   You may see Steven Klein, MD or one of the following Advanced Practice Providers on your designated Care Team:   Amber Seiler, NP Renee Ursuy, PA-C Michael "Andy" Tillery, PA-C  

## 2020-11-25 ENCOUNTER — Other Ambulatory Visit: Payer: Self-pay | Admitting: Nurse Practitioner

## 2020-11-29 ENCOUNTER — Telehealth: Payer: Self-pay

## 2020-11-29 NOTE — Telephone Encounter (Signed)
[facility] report on media reviewed states no diabetic Retinopathy.  Continue to keep blood sugar under control.

## 2020-11-29 NOTE — Telephone Encounter (Signed)
I am unable to interpreter eye exam to determine if patient has diabetic retinopathy or not and will send message back to Lauree Chandler, NP to advise.   Thanks,  S.Chrae B/CMA

## 2020-11-29 NOTE — Telephone Encounter (Signed)
This message populated from a message sent to me, from [place] (PCP). The message was attached to eye exam scanned under media dated 11/18/2020 from [facility]

## 2020-11-29 NOTE — Telephone Encounter (Signed)
Have you seen the eye specialist recently?

## 2020-11-29 NOTE — Telephone Encounter (Signed)
-----   Message from Lauree Chandler, NP sent at 11/18/2020  3:07 PM EDT -----  ----- Message ----- From: Willette Brace Sent: 11/18/2020  10:15 AM EDT To: Lauree Chandler, NP

## 2020-12-02 NOTE — Telephone Encounter (Signed)
Abstracted

## 2020-12-03 ENCOUNTER — Other Ambulatory Visit: Payer: Self-pay | Admitting: *Deleted

## 2020-12-03 DIAGNOSIS — G2581 Restless legs syndrome: Secondary | ICD-10-CM

## 2020-12-03 MED ORDER — ROPINIROLE HCL 0.25 MG PO TABS: 0.2500 mg | ORAL_TABLET | Freq: Every day | ORAL | 1 refills | Status: DC

## 2020-12-03 NOTE — Telephone Encounter (Signed)
Pharmacy requested refill

## 2020-12-20 ENCOUNTER — Ambulatory Visit (HOSPITAL_COMMUNITY): Payer: 59 | Attending: Cardiology

## 2020-12-20 ENCOUNTER — Other Ambulatory Visit: Payer: Self-pay

## 2020-12-20 DIAGNOSIS — I480 Paroxysmal atrial fibrillation: Secondary | ICD-10-CM | POA: Diagnosis present

## 2020-12-20 LAB — ECHOCARDIOGRAM COMPLETE
Area-P 1/2: 4.04 cm2
S' Lateral: 3.4 cm

## 2021-01-03 ENCOUNTER — Other Ambulatory Visit: Payer: Self-pay | Admitting: Nurse Practitioner

## 2021-01-03 DIAGNOSIS — M1990 Unspecified osteoarthritis, unspecified site: Secondary | ICD-10-CM

## 2021-01-27 ENCOUNTER — Other Ambulatory Visit: Payer: Self-pay | Admitting: Nurse Practitioner

## 2021-01-27 DIAGNOSIS — Z1231 Encounter for screening mammogram for malignant neoplasm of breast: Secondary | ICD-10-CM

## 2021-03-10 ENCOUNTER — Other Ambulatory Visit: Payer: Self-pay | Admitting: Nurse Practitioner

## 2021-03-10 DIAGNOSIS — M1990 Unspecified osteoarthritis, unspecified site: Secondary | ICD-10-CM

## 2021-03-11 ENCOUNTER — Other Ambulatory Visit: Payer: Self-pay

## 2021-03-11 DIAGNOSIS — I480 Paroxysmal atrial fibrillation: Secondary | ICD-10-CM

## 2021-03-11 MED ORDER — APIXABAN 5 MG PO TABS: 5.0000 mg | ORAL_TABLET | Freq: Two times a day (BID) | ORAL | 1 refills | Status: DC

## 2021-03-11 NOTE — Telephone Encounter (Signed)
Patient has request refill on medication "Tramadol". Patient last refill was 01/03/2021. Patient medication has allergy contraindication. Medication pend and sent to PCP Lauree Chandler, NP . Please Advise.

## 2021-03-11 NOTE — Telephone Encounter (Signed)
Prescription refill request for Eliquis received. Indication: a Fib Last office visit: 11/21/20 Scr: 1.19 Age: 70 Weight: 80kg

## 2021-03-14 LAB — HM DIABETES EYE EXAM

## 2021-03-28 ENCOUNTER — Ambulatory Visit
Admission: RE | Admit: 2021-03-28 | Discharge: 2021-03-28 | Disposition: A | Discharge status: Home / Self Care | Payer: 59 | Source: Ambulatory Visit | Attending: Nurse Practitioner | Admitting: Nurse Practitioner

## 2021-03-28 ENCOUNTER — Other Ambulatory Visit: Payer: Self-pay

## 2021-03-28 DIAGNOSIS — Z1231 Encounter for screening mammogram for malignant neoplasm of breast: Secondary | ICD-10-CM

## 2021-04-01 ENCOUNTER — Other Ambulatory Visit: Payer: Self-pay | Admitting: Nurse Practitioner

## 2021-04-01 DIAGNOSIS — E1169 Type 2 diabetes mellitus with other specified complication: Secondary | ICD-10-CM

## 2021-04-01 DIAGNOSIS — E785 Hyperlipidemia, unspecified: Secondary | ICD-10-CM

## 2021-04-24 ENCOUNTER — Other Ambulatory Visit: Payer: Self-pay | Admitting: Nurse Practitioner

## 2021-04-25 ENCOUNTER — Other Ambulatory Visit: Payer: Self-pay | Admitting: Nurse Practitioner

## 2021-04-25 DIAGNOSIS — G2581 Restless legs syndrome: Secondary | ICD-10-CM

## 2021-04-28 ENCOUNTER — Other Ambulatory Visit: Payer: Self-pay | Admitting: *Deleted

## 2021-04-28 MED ORDER — CARVEDILOL 25 MG PO TABS: 25.0000 mg | ORAL_TABLET | Freq: Two times a day (BID) | ORAL | 1 refills | Status: DC

## 2021-04-28 MED ORDER — CARVEDILOL 25 MG PO TABS
25.0000 mg | ORAL_TABLET | Freq: Two times a day (BID) | ORAL | 0 refills | Status: DC
Start: 2021-04-28 — End: 2021-11-14

## 2021-04-28 NOTE — Telephone Encounter (Signed)
Patient stated that she is out of medication and needs a Rx sent to her mail order. Also needs a supply sent to local pharmacy until she can get her mail order.

## 2021-05-01 ENCOUNTER — Other Ambulatory Visit: Payer: Self-pay | Admitting: Nurse Practitioner

## 2021-05-07 ENCOUNTER — Ambulatory Visit (INDEPENDENT_AMBULATORY_CARE_PROVIDER_SITE_OTHER): Payer: 59 | Admitting: Nurse Practitioner

## 2021-05-07 ENCOUNTER — Other Ambulatory Visit: Payer: Self-pay

## 2021-05-07 ENCOUNTER — Encounter: Payer: Self-pay | Admitting: Nurse Practitioner

## 2021-05-07 VITALS — BP 136/88 | HR 74 | Temp 97.6°F | Ht 67.0 in | Wt 170.0 lb

## 2021-05-07 DIAGNOSIS — E1169 Type 2 diabetes mellitus with other specified complication: Secondary | ICD-10-CM | POA: Diagnosis not present

## 2021-05-07 DIAGNOSIS — N1831 Chronic kidney disease, stage 3a: Secondary | ICD-10-CM

## 2021-05-07 DIAGNOSIS — I10 Essential (primary) hypertension: Secondary | ICD-10-CM

## 2021-05-07 DIAGNOSIS — D638 Anemia in other chronic diseases classified elsewhere: Secondary | ICD-10-CM

## 2021-05-07 DIAGNOSIS — M1990 Unspecified osteoarthritis, unspecified site: Secondary | ICD-10-CM

## 2021-05-07 DIAGNOSIS — I480 Paroxysmal atrial fibrillation: Secondary | ICD-10-CM

## 2021-05-07 DIAGNOSIS — E1122 Type 2 diabetes mellitus with diabetic chronic kidney disease: Secondary | ICD-10-CM | POA: Diagnosis not present

## 2021-05-07 DIAGNOSIS — N183 Chronic kidney disease, stage 3 unspecified: Secondary | ICD-10-CM

## 2021-05-07 DIAGNOSIS — E785 Hyperlipidemia, unspecified: Secondary | ICD-10-CM

## 2021-05-07 NOTE — Progress Notes (Signed)
Careteam: Patient Care Team: Lauree Chandler, NP as PCP - General (Geriatric Medicine) Deboraha Sprang, MD as PCP - Electrophysiology (Cardiology) Katherine Mantle, OD (Optometry)  PLACE OF SERVICE:  [place] Directive information Does Patient Have a Medical Advance Directive?: No, Would patient like information on creating a medical advance directive?: Yes (MAU/Ambulatory/Procedural Areas - Information given) (Has paperwork from a previous visit)  Allergies  Allergen Reactions   Hydrocodone Nausea And Vomiting   Lisinopril     Sick on the stomach    Chief Complaint  Patient presents with   Medical Management of Chronic Issues    6 month follow-up. Scheduled nurse for flu vaccine in September.      HPI: Patient is a 70 y.o. female for routine follow up.   Using eye drops daily and eyes are better  DM- blood sugars are good and bad- bad is over 140. Fasting in the 90s. Metformin 1000 mg by mouth twice daily. Had a low when she did not eat.   Htn- controlled.   Back pain- controlled.   A fib- eliquis for anticoaguation. No abnormal bruising or bleeding.  Continues on iron supplement  Review of Systems:  Review of Systems  Constitutional:  Negative for chills, fever and weight loss.  HENT:  Negative for tinnitus.   Respiratory:  Negative for cough, sputum production and shortness of breath.   Cardiovascular:  Negative for chest pain, palpitations and leg swelling.  Gastrointestinal:  Negative for abdominal pain, constipation, diarrhea and heartburn.  Genitourinary:  Negative for dysuria, frequency and urgency.  Musculoskeletal:  Negative for back pain, falls, joint pain and myalgias.  Skin: Negative.   Neurological:  Negative for dizziness and headaches.  Psychiatric/Behavioral:  Negative for depression and memory loss. The patient does not have insomnia.    Past Medical History:  Diagnosis Date   Anxiety    Arthritis    osteoarthritis    Breast cancer ([place]) 2008   right, ER/PR -   CHF (congestive heart failure) ([place])    Depression    Diabetes mellitus    TYPE 2   Gastroparesis    Hx of radiation therapy 04/04/07 to 05/20/07   right breast/6260 cGy   Hypercholesterolemia    Hypertension    Lumbago    Malignant neoplasm of breast (female), unspecified site    Nonspecific elevation of levels of transaminase or lactic acid dehydrogenase (LDH)    Osteoarthrosis, unspecified whether generalized or localized, unspecified site    Regional enteritis of unspecified site    Restless legs syndrome (RLS)    TIA (transient ischemic attack)    Urinary frequency    Uterine prolapse without mention of vaginal wall prolapse    Vitamin deficiency    Past Surgical History:  Procedure Laterality Date   ABDOMINAL HYSTERECTOMY  1997   TAH/BSO, ENDOMETRIAL SARCOMA   ARTHROPLASTY     left knee   BREAST LUMPECTOMY Right 11/17/06   re-excision 01/13/07   CATARACT EXTRACTION Right 08/2019   CATARACT EXTRACTION Left 07/2019   TOTAL KNEE ARTHROPLASTY Left 06/10/2011       TOTAL KNEE ARTHROPLASTY Right 2006, 2009   Dr Para March   TOTAL KNEE ARTHROPLASTY Left 2008   Dr Para March   WRIST SURGERY     carpel tunnel   Social History:   reports that she quit smoking about 29 years ago. Her smoking use included cigarettes. She has a 10.00 pack-year smoking history. She has never used smokeless  tobacco. She reports that she does not drink alcohol and does not use drugs.  Family History  Problem Relation Age of Onset   Diabetes Mother    Diabetes Father    Aneurysm Sister    Arthritis Sister    Diabetes Brother    Heart Problems Brother    Colon cancer Neg Hx     Medications: Patient's Medications  New Prescriptions   No medications on file  Previous Medications   ACCU-CHEK FASTCLIX LANCETS MISC    Check blood sugar once daily as directed E11.59   ACCU-CHEK GUIDE TEST STRIP    CHECK BLOOD SUGAR ONCE     DAILY AS DIRECTED.   AMLODIPINE  (NORVASC) 10 MG TABLET    TAKE 1/2 TABLET (5MG TOTAL)DAILY   APIXABAN (ELIQUIS) 5 MG TABS TABLET    Take 1 tablet (5 mg total) by mouth 2 (two) times daily.   ASCORBIC ACID (VITAMIN C) 500 MG TABLET    Take 1 tablet by mouth daily.   BLOOD GLUCOSE MONITORING SUPPL (ACCU-CHEK AVIVA PLUS) W/DEVICE KIT    Check blood sugar once daily as directed E11.59  Acc-Chek guide   CARVEDILOL (COREG) 25 MG TABLET    Take 1 tablet (25 mg total) by mouth 2 (two) times daily with a meal.   FERROUS SULFATE (IRON) 325 (65 FE) MG TABS    Take by mouth daily.   LOSARTAN (COZAAR) 100 MG TABLET    TAKE 1 TABLET BY MOUTH DAILY FOR BLOOD PRESSURE.   MENTHOL, TOPICAL ANALGESIC, (BIOFREEZE) 4 % GEL    Apply 3 oz topically 3 (three) times daily as needed. Apply to both knees for pain   METFORMIN (GLUCOPHAGE) 1000 MG TABLET    TAKE 1 TABLET TWICE DAILY  WITH MEALS   ROPINIROLE (REQUIP) 0.25 MG TABLET    TAKE 1 TABLET AT BEDTIME   FOR RESTLESS LEGS   ROSUVASTATIN (CRESTOR) 40 MG TABLET    TAKE 1 TABLET DAILY   TRAMADOL (ULTRAM) 50 MG TABLET    TAKE 1 TABLET BY MOUTH EVERY 12 HOURS AS NEEDED FOR SEVERE PAIN.   TRIAMCINOLONE OINTMENT (KENALOG) 0.1 %    as needed.  Modified Medications   No medications on file  Discontinued Medications   No medications on file    Physical Exam:  Vitals:   05/07/21 0822  BP: 136/88  Pulse: 74  Temp: 97.6 F (36.4 C)  TempSrc: Temporal  SpO2: 99%  Weight: 170 lb (77.1 kg)  Height: _0  (1.702 m)   Body mass index is 26.63 kg/m. Wt Readings from Last 3 Encounters:  05/07/21 170 lb (77.1 kg)  11/21/20 178 lb (80.7 kg)  11/15/20 174 lb 3.2 oz (79 kg)    Physical Exam Constitutional:      General: She is not in acute distress.    Appearance: She is well-developed. She is not diaphoretic.  HENT:     Head: Normocephalic and atraumatic.     Mouth/Throat:     Pharynx: No oropharyngeal exudate.  Eyes:     Conjunctiva/sclera: Conjunctivae normal.     Pupils: Pupils are equal,  round, and reactive to light.  Cardiovascular:     Rate and Rhythm: Normal rate and regular rhythm.     Heart sounds: Normal heart sounds.  Pulmonary:     Effort: Pulmonary effort is normal.     Breath sounds: Normal breath sounds.  Abdominal:     General: Bowel sounds are normal.  Palpations: Abdomen is soft.  Musculoskeletal:     Cervical back: Normal range of motion and neck supple.     Right lower leg: No edema.     Left lower leg: No edema.  Skin:    General: Skin is warm and dry.  Neurological:     Mental Status: She is alert.  Psychiatric:        Mood and Affect: Mood normal.    Labs reviewed: Basic Metabolic Panel: Recent Labs    07/02/20 1659 07/17/20 1444 11/15/20 0948  NA  --  142 142  K  --  4.1 3.7  CL  --  109 108  CO2  --  23 23  GLUCOSE  --  111* 87  BUN  --  30* 22  CREATININE  --  1.17* 1.19*  CALCIUM  --  9.4 9.4  TSH 0.556  --   --    Liver Function Tests: Recent Labs    07/17/20 1444 11/15/20 0948  AST 19 18  ALT 14 16  BILITOT 0.4 0.6  PROT 6.6 6.7   No results for input(s): LIPASE, AMYLASE in the last 8760 hours. No results for input(s): AMMONIA in the last 8760 hours. CBC: Recent Labs    07/17/20 1444 07/23/20 0814 11/15/20 0948  WBC 4.4 5.1 3.9  NEUTROABS 2,618  --  1,966  HGB 10.9* 10.7* 11.8  HCT 32.0* 30.8* 34.6*  MCV 94.1 92.5 93.8  PLT 182 183 209   Lipid Panel: Recent Labs    07/17/20 1444 11/15/20 0948  CHOL 194 175  HDL 91 81  LDLCALC 86 79  TRIG 77 72  CHOLHDL 2.1 2.2   TSH: Recent Labs    07/02/20 1659  TSH 0.556   A1C: Lab Results  Component Value Date   HGBA1C 6.8 (H) 11/15/2020     Assessment/Plan 1. Anemia, chronic disease -continues on iron supplement. - CBC with Differential/Platelet  2. CKD stage 3 due to type 2 diabetes mellitus (HCC) Chronic and stable Encourage proper hydration Follow metabolic panel Avoid nephrotoxic meds (NSAIDS) - CMP with eGFR([facility]) - Hemoglobin  A1c  3. Type 2 diabetes mellitus with stage 3a chronic kidney disease, without long-term current use of insulin ([place]) -Encouraged dietary compliance, routine foot care/monitoring and to keep up with diabetic eye exams through ophthalmology  -continues on metformin twice daily - Hemoglobin A1c  4. Essential hypertension --stable. Goal bp <140/90. Continue on norvasc, coreg, losartan with low sodium diet.  - CBC with Differential/Platelet - CMP with eGFR([facility])  5. Hyperlipidemia associated with type 2 diabetes mellitus ([place]) --continues on crestor. LDL at goal. Continue dietary modifications with medication management.   6. AF (paroxysmal atrial fibrillation) (HCC) Stable, Continues on coreg for rate control. Eliquis for anticoagulation.  - CBC with Differential/Platelet  7. Osteoarthritis, unspecified osteoarthritis type, unspecified site -stable at this time. No acute pain.   Next appt: 6 months. Carlos American. [place], [facility] [phone]

## 2021-05-08 LAB — COMPLETE METABOLIC PANEL WITH GFR
AG Ratio: 1.7 (calc) (ref 1.0–2.5)
ALT: 22 U/L (ref 6–29)
AST: 24 U/L (ref 10–35)
Albumin: 4.2 g/dL (ref 3.6–5.1)
Alkaline phosphatase (APISO): 70 U/L (ref 37–153)
BUN/Creatinine Ratio: 25 (calc) — ABNORMAL HIGH (ref 6–22)
BUN: 30 mg/dL — ABNORMAL HIGH (ref 7–25)
CO2: 23 mmol/L (ref 20–32)
Calcium: 9.7 mg/dL (ref 8.6–10.4)
Chloride: 109 mmol/L (ref 98–110)
Creat: 1.2 mg/dL — ABNORMAL HIGH (ref 0.60–1.00)
Globulin: 2.5 g/dL (calc) (ref 1.9–3.7)
Glucose, Bld: 86 mg/dL (ref 65–99)
Potassium: 3.9 mmol/L (ref 3.5–5.3)
Sodium: 140 mmol/L (ref 135–146)
Total Bilirubin: 0.5 mg/dL (ref 0.2–1.2)
Total Protein: 6.7 g/dL (ref 6.1–8.1)
eGFR: 48 mL/min/{1.73_m2} — ABNORMAL LOW (ref >=60)

## 2021-05-08 LAB — CBC WITH DIFFERENTIAL/PLATELET
Absolute Monocytes: 524 cells/uL (ref 200–950)
Basophils Absolute: 10 cells/uL (ref 0–200)
Basophils Relative: 0.2 %
Eosinophils Absolute: 78 cells/uL (ref 15–500)
Eosinophils Relative: 1.6 %
HCT: 33.1 % — ABNORMAL LOW (ref 35.0–45.0)
Hemoglobin: 10.8 g/dL — ABNORMAL LOW (ref 11.7–15.5)
Lymphs Abs: 1607 cells/uL (ref 850–3900)
MCH: 30.4 pg (ref 27.0–33.0)
MCHC: 32.6 g/dL (ref 32.0–36.0)
MCV: 93.2 fL (ref 80.0–100.0)
MPV: 10.4 fL (ref 7.5–12.5)
Monocytes Relative: 10.7 %
Neutro Abs: 2680 cells/uL (ref 1500–7800)
Neutrophils Relative %: 54.7 %
Platelets: 216 10*3/uL (ref 140–400)
RBC: 3.55 10*6/uL — ABNORMAL LOW (ref 3.80–5.10)
RDW: 14.5 % (ref 11.0–15.0)
Total Lymphocyte: 32.8 %
WBC: 4.9 10*3/uL (ref 3.8–10.8)

## 2021-05-08 LAB — HEMOGLOBIN A1C
Hgb A1c MFr Bld: 6.4 % of total Hgb — ABNORMAL HIGH (ref <5.7)
Mean Plasma Glucose: 137 mg/dL
eAG (mmol/L): 7.6 mmol/L

## 2021-05-09 ENCOUNTER — Ambulatory Visit: Payer: 59 | Admitting: Student

## 2021-05-14 NOTE — Progress Notes (Signed)
PCP:  Lauree Chandler, NP Primary Cardiologist: None Electrophysiologist: [name], MD   [name] is a 70 y.o. female seen today for [name], MD for routine electrophysiology followup.  Since last being seen in our clinic the patient reports doing very well. She has not felt any breakthrough AF. She monitors BP at home and mostly in low 130s, occasionally in 140s.  she denies chest pain, palpitations, dyspnea, PND, orthopnea, nausea, vomiting, dizziness, syncope, edema, weight gain, or early satiety.  Past Medical History:  Diagnosis Date   Anxiety    Arthritis    osteoarthritis   Breast cancer ([place]) 2008   right, ER/PR -   CHF (congestive heart failure) ([place])    Depression    Diabetes mellitus    TYPE 2   Gastroparesis    Hx of radiation therapy 04/04/07 to 05/20/07   right breast/6260 cGy   Hypercholesterolemia    Hypertension    Lumbago    Malignant neoplasm of breast (female), unspecified site    Nonspecific elevation of levels of transaminase or lactic acid dehydrogenase (LDH)    Osteoarthrosis, unspecified whether generalized or localized, unspecified site    Regional enteritis of unspecified site    Restless legs syndrome (RLS)    TIA (transient ischemic attack)    Urinary frequency    Uterine prolapse without mention of vaginal wall prolapse    Vitamin deficiency    Past Surgical History:  Procedure Laterality Date   ABDOMINAL HYSTERECTOMY  1997   TAH/BSO, ENDOMETRIAL SARCOMA   ARTHROPLASTY     left knee   BREAST LUMPECTOMY Right 11/17/06   re-excision 01/13/07   CATARACT EXTRACTION Right 08/2019   CATARACT EXTRACTION Left 07/2019   TOTAL KNEE ARTHROPLASTY Left 06/10/2011       TOTAL KNEE ARTHROPLASTY Right 2006, 2009   Dr Para March   TOTAL KNEE ARTHROPLASTY Left 2008   Dr Para March   WRIST SURGERY     carpel tunnel    Current Outpatient Medications  Medication Sig Dispense Refill   Accu-Chek FastClix Lancets MISC Check blood sugar once  daily as directed E11.59 300 each 1   ACCU-CHEK GUIDE test strip CHECK BLOOD SUGAR ONCE     DAILY AS DIRECTED. 100 strip 3   amLODipine (NORVASC) 10 MG tablet TAKE 1/2 TABLET ($RemoveBef'5MG'bCkSwUSFHv$  TOTAL)DAILY 45 tablet 1   apixaban (ELIQUIS) 5 MG TABS tablet Take 1 tablet (5 mg total) by mouth 2 (two) times daily. 180 tablet 1   ascorbic acid (VITAMIN C) 500 MG tablet Take 1 tablet by mouth daily.     Blood Glucose Monitoring Suppl (ACCU-CHEK AVIVA PLUS) w/Device KIT Check blood sugar once daily as directed E11.59  Acc-Chek guide 1 kit 0   carvedilol (COREG) 25 MG tablet Take 1 tablet (25 mg total) by mouth 2 (two) times daily with a meal. 60 tablet 0   Ferrous Sulfate (IRON) 325 (65 Fe) MG TABS Take by mouth daily.     losartan (COZAAR) 100 MG tablet TAKE 1 TABLET BY MOUTH DAILY FOR BLOOD PRESSURE. 90 tablet 3   Menthol, Topical Analgesic, (BIOFREEZE) 4 % GEL Apply 3 oz topically 3 (three) times daily as needed. Apply to both knees for pain 90 g 3   metFORMIN (GLUCOPHAGE) 1000 MG tablet TAKE 1 TABLET TWICE DAILY  WITH MEALS 180 tablet 0   rOPINIRole (REQUIP) 0.25 MG tablet TAKE 1 TABLET AT BEDTIME   FOR RESTLESS LEGS 90 tablet 1   rosuvastatin (CRESTOR) 40  MG tablet TAKE 1 TABLET DAILY 90 tablet 3   traMADol (ULTRAM) 50 MG tablet TAKE 1 TABLET BY MOUTH EVERY 12 HOURS AS NEEDED FOR SEVERE PAIN. 60 tablet 0   triamcinolone ointment (KENALOG) 0.1 % as needed.     No current facility-administered medications for this visit.    Allergies  Allergen Reactions   Hydrocodone Nausea And Vomiting   Lisinopril     Sick on the stomach    Social History   Socioeconomic History   Marital status: Widowed    Spouse name: Not on file   Number of children: 4   Years of education: Not on file   Highest education level: Not on file  Occupational History   Occupation: [employer]  Tobacco Use   Smoking status: Former    Packs/day: 0.50    Years: 20.00    Pack years: 10.00    Types: Cigarettes    Quit date:  09/08/1991    Years since quitting: 29.7   Smokeless tobacco: Never  Vaping Use   Vaping Use: Never used  Substance and Sexual Activity   Alcohol use: No   Drug use: No   Sexual activity: Never  Other Topics Concern   Not on file  Social History Narrative   Widowed   Lives with daughter   Former smoker-stopped 1993   Alcohol none   Exercise 3 days a week goes to [employer]    Social Determinants of Health   Financial Resource Strain: Not on file  Food Insecurity: Not on file  Transportation Needs: Not on file  Physical Activity: Not on file  Stress: Not on file  Social Connections: Not on file  Intimate Partner Violence: Not on file    Review of Systems: All other systems reviewed and are otherwise negative except as noted above.  Physical Exam: Vitals:   05/15/21 0928  BP: 130/66  Pulse: 82  SpO2: 96%  Weight: 173 lb (78.5 kg)  Height: 5' 6.5" (1.689 m)    GEN- The patient is well appearing, alert and oriented x 3 today.   HEENT: normocephalic, atraumatic; sclera clear, conjunctiva pink; hearing intact; oropharynx clear; neck supple, no JVP Lymph- no cervical lymphadenopathy Lungs- Clear to ausculation bilaterally, normal work of breathing.  No wheezes, rales, rhonchi Heart- Regular rate and rhythm, no murmurs, rubs or gallops, PMI not laterally displaced GI- soft, non-tender, non-distended, bowel sounds present, no hepatosplenomegaly Extremities- no clubbing, cyanosis, or edema; DP/PT/radial pulses 2+ bilaterally MS- no significant deformity or atrophy Skin- warm and dry, no rash or lesion Psych- euthymic mood, full affect Neuro- strength and sensation are intact  EKG is not ordered.   Additional studies reviewed include: Previous EP office notes.   Assessment and Plan:  1. Chest pain, atypical Echo 12/2020 LVEF 60-65% EKG at last visit without ischemic changes. Morphology unchanged since 06/2020 (other than now more isoelectric in aVL). No ST  elevation or T wave changes. CT Ca scoring is not appropriate in the setting of any pain per previous discussion with interventional team.  With very atypical presentation and no pattern, will defer stress testing for now. Echo re-assuring with no WMA. Suspect non-cardiac chest pain, most likely MSK.    2. AFib Regular by exam today. She does not feel like she has had any breakthrough.  Continue eliquis for CHA2DS2VASC of at least 4.     3. HTN Continue current regimen Watch sodium intake. If 140 or above at home with any  regularity could try 7.5 mg amlodipine. Was previously decreased to 5 mg in the setting of edema in 2019.   RTC annually, as she sees PCP for labs q 6 months. Sooner with issues.    Shirley Friar, PA-C  05/15/21 9:33 AM

## 2021-05-15 ENCOUNTER — Encounter: Payer: Self-pay | Admitting: Student

## 2021-05-15 ENCOUNTER — Other Ambulatory Visit: Payer: Self-pay

## 2021-05-15 ENCOUNTER — Ambulatory Visit (INDEPENDENT_AMBULATORY_CARE_PROVIDER_SITE_OTHER): Payer: 59 | Admitting: *Deleted

## 2021-05-15 ENCOUNTER — Ambulatory Visit (INDEPENDENT_AMBULATORY_CARE_PROVIDER_SITE_OTHER): Payer: 59 | Admitting: Student

## 2021-05-15 VITALS — BP 130/66 | HR 82 | Ht 66.5 in | Wt 173.0 lb

## 2021-05-15 DIAGNOSIS — R079 Chest pain, unspecified: Secondary | ICD-10-CM

## 2021-05-15 DIAGNOSIS — I429 Cardiomyopathy, unspecified: Secondary | ICD-10-CM | POA: Diagnosis not present

## 2021-05-15 DIAGNOSIS — Z23 Encounter for immunization: Secondary | ICD-10-CM | POA: Diagnosis not present

## 2021-05-15 DIAGNOSIS — I480 Paroxysmal atrial fibrillation: Secondary | ICD-10-CM | POA: Diagnosis not present

## 2021-05-15 NOTE — Patient Instructions (Signed)
Medication Instructions:  Your physician recommends that you continue on your current medications as directed. Please refer to the Current Medication list given to you today.  *If you need a refill on your cardiac medications before your next appointment, please call your pharmacy*   Lab Work: None If you have labs (blood work) drawn today and your tests are completely normal, you will receive your results only by: [place] (if you have MyChart) OR A paper copy in the mail If you have any lab test that is abnormal or we need to change your treatment, we will call you to review the results.   Follow-Up: At [facility], you and your health needs are our priority.  As part of our continuing mission to provide you with exceptional heart care, we have created designated Provider Care Teams.  These Care Teams include your primary Cardiologist (physician) and Advanced Practice Providers (APPs -  Physician Assistants and Nurse Practitioners) who all work together to provide you with the care you need, when you need it.   Your next appointment:   1 year(s)  The format for your next appointment:   In Person  Provider:   Legrand Como "Oda Kilts, PA-C

## 2021-05-16 ENCOUNTER — Ambulatory Visit: Payer: Medicare Other | Admitting: Nurse Practitioner

## 2021-05-23 ENCOUNTER — Ambulatory Visit: Payer: Medicare Other

## 2021-05-27 ENCOUNTER — Ambulatory Visit (INDEPENDENT_AMBULATORY_CARE_PROVIDER_SITE_OTHER): Payer: 59 | Admitting: Nurse Practitioner

## 2021-05-27 ENCOUNTER — Telehealth: Payer: Self-pay

## 2021-05-27 ENCOUNTER — Other Ambulatory Visit: Payer: Self-pay

## 2021-05-27 ENCOUNTER — Encounter: Payer: Self-pay | Admitting: Nurse Practitioner

## 2021-05-27 VITALS — BP 128/66 | Wt 168.0 lb

## 2021-05-27 DIAGNOSIS — Z0000 Encounter for general adult medical examination without abnormal findings: Secondary | ICD-10-CM | POA: Diagnosis not present

## 2021-05-27 DIAGNOSIS — E2839 Other primary ovarian failure: Secondary | ICD-10-CM | POA: Diagnosis not present

## 2021-05-27 NOTE — Patient Instructions (Signed)
[name] , Thank you for taking time to come for your Medicare Wellness Visit. I appreciate your ongoing commitment to your health goals. Please review the following plan we discussed and let me know if I can assist you in the future.   Screening recommendations/referrals: Colonoscopy up to date  Mammogram up to date Bone Density ordered today Recommended yearly ophthalmology/optometry visit for glaucoma screening and checkup Recommended yearly dental visit for hygiene and checkup  Vaccinations: Influenza vaccine up to date Pneumococcal vaccine up to date Tdap vaccine up to date Shingles vaccine up to date    Advanced directives: recommend to complete and bring to office to place on file.   Conditions/risks identified: advance age, weight loss  Next appointment: 1 year for AWV   Preventive Care 35 Years and Older, Female Preventive care refers to lifestyle choices and visits with your health care provider that can promote health and wellness. What does preventive care include? A yearly physical exam. This is also called an annual well check. Dental exams once or twice a year. Routine eye exams. Ask your health care provider how often you should have your eyes checked. Personal lifestyle choices, including: Daily care of your teeth and gums. Regular physical activity. Eating a healthy diet. Avoiding tobacco and drug use. Limiting alcohol use. Practicing safe sex. Taking low-dose aspirin every day. Taking vitamin and mineral supplements as recommended by your health care provider. What happens during an annual well check? The services and screenings done by your health care provider during your annual well check will depend on your age, overall health, lifestyle risk factors, and family history of disease. Counseling  Your health care provider may ask you questions about your: Alcohol use. Tobacco use. Drug use. Emotional well-being. Home and relationship  well-being. Sexual activity. Eating habits. History of falls. Memory and ability to understand (cognition). Work and work Statistician. Reproductive health. Screening  You may have the following tests or measurements: Height, weight, and BMI. Blood pressure. Lipid and cholesterol levels. These may be checked every 5 years, or more frequently if you are over 67 years old. Skin check. Lung cancer screening. You may have this screening every year starting at age 67 if you have a 30-pack-year history of smoking and currently smoke or have quit within the past 15 years. Fecal occult blood test (FOBT) of the stool. You may have this test every year starting at age 63. Flexible sigmoidoscopy or colonoscopy. You may have a sigmoidoscopy every 5 years or a colonoscopy every 10 years starting at age 59. Hepatitis C blood test. Hepatitis B blood test. Sexually transmitted disease (STD) testing. Diabetes screening. This is done by checking your blood sugar (glucose) after you have not eaten for a while (fasting). You may have this done every 1-3 years. Bone density scan. This is done to screen for osteoporosis. You may have this done starting at age 67. Mammogram. This may be done every 1-2 years. Talk to your health care provider about how often you should have regular mammograms. Talk with your health care provider about your test results, treatment options, and if necessary, the need for more tests. Vaccines  Your health care provider may recommend certain vaccines, such as: Influenza vaccine. This is recommended every year. Tetanus, diphtheria, and acellular pertussis (Tdap, Td) vaccine. You may need a Td booster every 10 years. Zoster vaccine. You may need this after age 39. Pneumococcal 13-valent conjugate (PCV13) vaccine. One dose is recommended after age 39. Pneumococcal polysaccharide (PPSV23)  vaccine. One dose is recommended after age 11. Talk to your health care provider about which  screenings and vaccines you need and how often you need them. This information is not intended to replace advice given to you by your health care provider. Make sure you discuss any questions you have with your health care provider. Document Released: 09/20/2015 Document Revised: 05/13/2016 Document Reviewed: 06/25/2015 Elsevier Interactive Patient Education  2017 [place] Prevention in the Home Falls can cause injuries. They can happen to people of all ages. There are many things you can do to make your home safe and to help prevent falls. What can I do on the outside of my home? Regularly fix the edges of walkways and driveways and fix any cracks. Remove anything that might make you trip as you walk through a door, such as a raised step or threshold. Trim any bushes or trees on the path to your home. Use bright outdoor lighting. Clear any walking paths of anything that might make someone trip, such as rocks or tools. Regularly check to see if handrails are loose or broken. Make sure that both sides of any steps have handrails. Any raised decks and porches should have guardrails on the edges. Have any leaves, snow, or ice cleared regularly. Use sand or salt on walking paths during winter. Clean up any spills in your garage right away. This includes oil or grease spills. What can I do in the bathroom? Use night lights. Install grab bars by the toilet and in the tub and shower. Do not use towel bars as grab bars. Use non-skid mats or decals in the tub or shower. If you need to sit down in the shower, use a plastic, non-slip stool. Keep the floor dry. Clean up any water that spills on the floor as soon as it happens. Remove soap buildup in the tub or shower regularly. Attach bath mats securely with double-sided non-slip rug tape. Do not have throw rugs and other things on the floor that can make you trip. What can I do in the bedroom? Use night lights. Make sure that you have a  light by your bed that is easy to reach. Do not use any sheets or blankets that are too big for your bed. They should not hang down onto the floor. Have a firm chair that has side arms. You can use this for support while you get dressed. Do not have throw rugs and other things on the floor that can make you trip. What can I do in the kitchen? Clean up any spills right away. Avoid walking on wet floors. Keep items that you use a lot in easy-to-reach places. If you need to reach something above you, use a strong step stool that has a grab bar. Keep electrical cords out of the way. Do not use floor polish or wax that makes floors slippery. If you must use wax, use non-skid floor wax. Do not have throw rugs and other things on the floor that can make you trip. What can I do with my stairs? Do not leave any items on the stairs. Make sure that there are handrails on both sides of the stairs and use them. Fix handrails that are broken or loose. Make sure that handrails are as long as the stairways. Check any carpeting to make sure that it is firmly attached to the stairs. Fix any carpet that is loose or worn. Avoid having throw rugs at the top or bottom  of the stairs. If you do have throw rugs, attach them to the floor with carpet tape. Make sure that you have a light switch at the top of the stairs and the bottom of the stairs. If you do not have them, ask someone to add them for you. What else can I do to help prevent falls? Wear shoes that: Do not have high heels. Have rubber bottoms. Are comfortable and fit you well. Are closed at the toe. Do not wear sandals. If you use a stepladder: Make sure that it is fully opened. Do not climb a closed stepladder. Make sure that both sides of the stepladder are locked into place. Ask someone to hold it for you, if possible. Clearly mark and make sure that you can see: Any grab bars or handrails. First and last steps. Where the edge of each step  is. Use tools that help you move around (mobility aids) if they are needed. These include: Canes. Walkers. Scooters. Crutches. Turn on the lights when you go into a dark area. Replace any light bulbs as soon as they burn out. Set up your furniture so you have a clear path. Avoid moving your furniture around. If any of your floors are uneven, fix them. If there are any pets around you, be aware of where they are. Review your medicines with your doctor. Some medicines can make you feel dizzy. This can increase your chance of falling. Ask your doctor what other things that you can do to help prevent falls. This information is not intended to replace advice given to you by your health care provider. Make sure you discuss any questions you have with your health care provider. Document Released: 06/20/2009 Document Revised: 01/30/2016 Document Reviewed: 09/28/2014 Elsevier Interactive Patient Education  2017 [employer].

## 2021-05-27 NOTE — Progress Notes (Signed)
This service is provided via telemedicine  Vital Signs were collected by patient during telemedicine visit and documented by Carroll Kinds, CMA  Location of patient (ex: home, work):  Home  Patient consents to a telephone visit:  Yes see encounter dated 05/27/2021  Location of the provider (ex: office, home):  [place]  Name of any referring provider:  N/A  Names of all persons participating in the telemedicine service and their role in the encounter:  Sherrie Mustache, Nurse Practitioner, Carroll Kinds, CMA, and patient.   Time spent on call:  11 minutes with medical assistant

## 2021-05-27 NOTE — Telephone Encounter (Signed)
[name], badger are scheduled for a virtual visit with your provider today.    Just as we do with appointments in the office, we must obtain your consent to participate.  Your consent will be active for this visit and any virtual visit you may have with one of our providers in the next 365 days.    If you have a MyChart account, I can also send a copy of this consent to you electronically.  All virtual visits are billed to your insurance company just like a traditional visit in the office.  As this is a virtual visit, video technology does not allow for your provider to perform a traditional examination.  This may limit your provider's ability to fully assess your condition.  If your provider identifies any concerns that need to be evaluated in person or the need to arrange testing such as labs, EKG, etc, we will make arrangements to do so.    Although advances in technology are sophisticated, we cannot ensure that it will always work on either your end or our end.  If the connection with a video visit is poor, we may have to switch to a telephone visit.  With either a video or telephone visit, we are not always able to ensure that we have a secure connection.   I need to obtain your verbal consent now.   Are you willing to proceed with your visit today?   [name] has provided verbal consent on 05/27/2021 for a virtual visit (video or telephone).   Carroll Kinds, CMA 05/27/2021  9:11 AM

## 2021-05-27 NOTE — Telephone Encounter (Signed)
Ms. karisa, nesser are scheduled for a virtual visit with your provider today.    Just as we do with appointments in the office, we must obtain your consent to participate.  Your consent will be active for this visit and any virtual visit you may have with one of our providers in the next 365 days.    If you have a MyChart account, I can also send a copy of this consent to you electronically.  All virtual visits are billed to your insurance company just like a traditional visit in the office.  As this is a virtual visit, video technology does not allow for your provider to perform a traditional examination.  This may limit your provider's ability to fully assess your condition.  If your provider identifies any concerns that need to be evaluated in person or the need to arrange testing such as labs, EKG, etc, we will make arrangements to do so.    Although advances in technology are sophisticated, we cannot ensure that it will always work on either your end or our end.  If the connection with a video visit is poor, we may have to switch to a telephone visit.  With either a video or telephone visit, we are not always able to ensure that we have a secure connection.   I need to obtain your verbal consent now.   Are you willing to proceed with your visit today?   Aquilla Shambley Briel has provided verbal consent on 05/27/2021 for a virtual visit (video or telephone).   Carroll Kinds, [facility] 05/27/2021  8:43 AM

## 2021-05-27 NOTE — Progress Notes (Signed)
Subjective:   [name] is a 70 y.o. female who presents for Medicare Annual (Subsequent) preventive examination.  Review of Systems     Cardiac Risk Factors include: advanced age (>58men, >34 women);hypertension;dyslipidemia;diabetes mellitus     Objective:    Today's Vitals   05/27/21 0839  BP: 128/66  Weight: 168 lb (76.2 kg)   Body mass index is 26.71 kg/m.  Advanced Directives 05/27/2021 05/07/2021 11/15/2020 07/17/2020 05/27/2020 05/24/2020 01/12/2020  Does Patient Have a Medical Advance Directive? No No No No No No No  Would patient like information on creating a medical advance directive? - Yes (MAU/Ambulatory/Procedural Areas - Information given) - Yes (MAU/Ambulatory/Procedural Areas - Information given) Yes (MAU/Ambulatory/Procedural Areas - Information given) Yes (MAU/Ambulatory/Procedural Areas - Information given) Yes (ED - Information included in AVS)    Current Medications (verified) Outpatient Encounter Medications as of 05/27/2021  Medication Sig   Accu-Chek FastClix Lancets MISC Check blood sugar once daily as directed E11.59   ACCU-CHEK GUIDE test strip CHECK BLOOD SUGAR ONCE     DAILY AS DIRECTED.   amLODipine (NORVASC) 10 MG tablet TAKE 1/2 TABLET ($RemoveBef'5MG'idEzdJfBkC$  TOTAL)DAILY   apixaban (ELIQUIS) 5 MG TABS tablet Take 1 tablet (5 mg total) by mouth 2 (two) times daily.   ascorbic acid (VITAMIN C) 500 MG tablet Take 1 tablet by mouth daily.   Blood Glucose Monitoring Suppl (ACCU-CHEK AVIVA PLUS) w/Device KIT Check blood sugar once daily as directed E11.59  Acc-Chek guide   carvedilol (COREG) 25 MG tablet Take 1 tablet (25 mg total) by mouth 2 (two) times daily with a meal.   Ferrous Sulfate (IRON) 325 (65 Fe) MG TABS Take by mouth daily.   losartan (COZAAR) 100 MG tablet TAKE 1 TABLET BY MOUTH DAILY FOR BLOOD PRESSURE.   Menthol, Topical Analgesic, (BIOFREEZE) 4 % GEL Apply 3 oz topically 3 (three) times daily as needed. Apply to both knees for pain   metFORMIN  (GLUCOPHAGE) 1000 MG tablet TAKE 1 TABLET TWICE DAILY  WITH MEALS   rOPINIRole (REQUIP) 0.25 MG tablet TAKE 1 TABLET AT BEDTIME   FOR RESTLESS LEGS   rosuvastatin (CRESTOR) 40 MG tablet TAKE 1 TABLET DAILY   traMADol (ULTRAM) 50 MG tablet TAKE 1 TABLET BY MOUTH EVERY 12 HOURS AS NEEDED FOR SEVERE PAIN.   triamcinolone ointment (KENALOG) 0.1 % as needed.   No facility-administered encounter medications on file as of 05/27/2021.    Allergies (verified) Hydrocodone and Lisinopril   History: Past Medical History:  Diagnosis Date   Anxiety    Arthritis    osteoarthritis   Breast cancer ([place]) 2008   right, ER/PR -   CHF (congestive heart failure) (HCC)    Depression    Diabetes mellitus    TYPE 2   Gastroparesis    Hx of radiation therapy 04/04/07 to 05/20/07   right breast/6260 cGy   Hypercholesterolemia    Hypertension    Lumbago    Malignant neoplasm of breast (female), unspecified site    Nonspecific elevation of levels of transaminase or lactic acid dehydrogenase (LDH)    Osteoarthrosis, unspecified whether generalized or localized, unspecified site    Regional enteritis of unspecified site    Restless legs syndrome (RLS)    TIA (transient ischemic attack)    Urinary frequency    Uterine prolapse without mention of vaginal wall prolapse    Vitamin deficiency    Past Surgical History:  Procedure Laterality Date   ABDOMINAL HYSTERECTOMY  1997   TAH/BSO,  ENDOMETRIAL SARCOMA   ARTHROPLASTY     left knee   BREAST LUMPECTOMY Right 11/17/06   re-excision 01/13/07   CATARACT EXTRACTION Right 08/2019   CATARACT EXTRACTION Left 07/2019   TOTAL KNEE ARTHROPLASTY Left 06/10/2011       TOTAL KNEE ARTHROPLASTY Right 2006, 2009   Dr Para March   TOTAL KNEE ARTHROPLASTY Left 2008   Dr Para March   WRIST SURGERY     carpel tunnel   Family History  Problem Relation Age of Onset   Diabetes Mother    Diabetes Father    Aneurysm Sister    Arthritis Sister    Diabetes Brother    Heart  Problems Brother    Colon cancer Neg Hx    Social History   Socioeconomic History   Marital status: Widowed    Spouse name: Not on file   Number of children: 4   Years of education: Not on file   Highest education level: Not on file  Occupational History   Occupation: [employer]  Tobacco Use   Smoking status: Former    Packs/day: 0.50    Years: 20.00    Pack years: 10.00    Types: Cigarettes    Quit date: 09/08/1991    Years since quitting: 29.7   Smokeless tobacco: Never  Vaping Use   Vaping Use: Never used  Substance and Sexual Activity   Alcohol use: No   Drug use: No   Sexual activity: Never  Other Topics Concern   Not on file  Social History Narrative   Widowed   Lives with daughter   Former smoker-stopped 1993   Alcohol none   Exercise 3 days a week goes to [employer]    Social Determinants of Radio broadcast assistant Strain: Not on file  Food Insecurity: Not on file  Transportation Needs: Not on file  Physical Activity: Not on file  Stress: Not on file  Social Connections: Not on file    Tobacco Counseling Counseling given: Not Answered   Clinical Intake:  Pre-visit preparation completed: Yes  Pain : No/denies pain     BMI - recorded: 26 Nutritional Status: BMI 25 -29 Overweight Nutritional Risks: Unintentional weight loss, Nausea/ vomitting/ diarrhea (diarrhea) Diabetes: No  How often do you need to have someone help you when you read instructions, pamphlets, or other written materials from your doctor or pharmacy?: 2 - Rarely  Diabetic?         Activities of Daily Living In your present state of health, do you have any difficulty performing the following activities: 05/27/2021  Hearing? N  Vision? N  Difficulty concentrating or making decisions? N  Walking or climbing stairs? N  Dressing or bathing? N  Doing errands, shopping? N  Preparing Food and eating ? N  Using the Toilet? N  In the past six months, have you  accidently leaked urine? N  Do you have problems with loss of bowel control? N  Managing your Medications? N  Managing your Finances? N  Housekeeping or managing your Housekeeping? N  Some recent data might be hidden    Patient Care Team: Lauree Chandler, NP as PCP - General (Geriatric Medicine) Deboraha Sprang, MD as PCP - Electrophysiology (Cardiology) Katherine Mantle, [place] (Optometry)  Indicate any recent Medical Services you may have received from other than [facility] providers in the past year (date may be approximate).     Assessment:   This is a routine wellness examination for  Niyla.  Hearing/Vision screen Hearing Screening - Comments:: Patient has no vision problems Vision Screening - Comments:: Patient has no vision problems. Patient had eye exam July 2022. [facility]  Dietary issues and exercise activities discussed: Current Exercise Habits: The patient has a physically strenuous job, but has no regular exercise apart from work.   Goals Addressed   None    Depression Screen PHQ 2/9 Scores 05/27/2021 05/24/2020 09/25/2019 05/05/2019 04/29/2018 10/15/2017 04/09/2017  PHQ - 2 Score 0 0 0 0 0 0 0  PHQ- 9 Score - - - - - - -    Fall Risk Fall Risk  05/27/2021 05/07/2021 07/17/2020 05/24/2020 01/12/2020  Falls in the past year? 0 0 0 0 0  Number falls in past yr: 0 0 0 0 0  Injury with Fall? 0 0 0 0 0  Risk for fall due to : No Fall Risks No Fall Risks - - -  Follow up Falls evaluation completed Falls evaluation completed - - -    FALL RISK PREVENTION PERTAINING TO THE HOME:  Any stairs in or around the home? No  If so, are there any without handrails? No  Home free of loose throw rugs in walkways, pet beds, electrical cords, etc? Yes  Adequate lighting in your home to reduce risk of falls? Yes   ASSISTIVE DEVICES UTILIZED TO PREVENT FALLS:  Life alert? No  Use of a cane, walker or w/c? No  Grab bars in the bathroom? Yes  Shower chair or bench in shower? No   Elevated toilet seat or a handicapped toilet? Yes   TIMED UP AND GO:  Was the test performed? No .    Cognitive Function: MMSE - Mini Mental State Exam 04/29/2018 04/09/2017 04/10/2016 04/05/2015  Orientation to time 5 5 5 5   Orientation to Place 5 5 5 5   Registration 3 3 3 3   Attention/ Calculation 5 4 5 4   Recall 3 3 2 2   Language- name 2 objects 2 2 2 2   Language- repeat 1 1 1 1   Language- follow 3 step command 3 3 3 3   Language- read & follow direction 1 1 1 1   Write a sentence 0 1 1 1   Copy design 1 0 0 1  Total score 29 28 28 28      6CIT Screen 05/27/2021 05/24/2020  What Year? 0 points 0 points  What month? 0 points 0 points  What time? 0 points 0 points  Count back from 20 0 points 0 points  Months in reverse 4 points 0 points  Repeat phrase 4 points 6 points  Total Score 8 6    Immunizations Immunization History  Administered Date(s) Administered   Fluad Quad(high Dose 65+) 05/05/2019, 05/15/2021   Influenza, High Dose Seasonal PF 06/04/2017, 05/24/2018, 05/22/2020   Influenza,inj,Quad PF,6+ Mos 07/05/2015   Influenza-Unspecified 07/05/2013, 06/07/2014, 07/08/2016, 05/22/2020   PFIZER(Purple Top)SARS-COV-2 Vaccination 12/22/2019, 01/12/2020, 08/09/2020, 01/05/2021, 01/31/2021   Pneumococcal Conjugate-13 03/07/2014   Pneumococcal Polysaccharide-23 06/08/1999, 11/04/2018   Td 09/08/2003   Tdap 11/29/2013   Zoster Recombinat (Shingrix) 05/07/2019, 09/07/2019   Zoster, Live 08/08/2013    TDAP status: Up to date  Flu Vaccine status: Up to date  Pneumococcal vaccine status: Up to date  Covid-19 vaccine status: Information provided on how to obtain vaccines.   Qualifies for Shingles Vaccine? Yes   Zostavax completed Yes   Shingrix Completed?: Yes  Screening Tests Health Maintenance  Topic Date Due   Hepatitis C  Screening  Never done   HEMOGLOBIN A1C  11/04/2021   FOOT EXAM  11/15/2021   OPHTHALMOLOGY EXAM  03/14/2022   MAMMOGRAM  03/29/2023    TETANUS/TDAP  11/30/2023   COLONOSCOPY (Pts 45-6yrs Insurance coverage will need to be confirmed)  01/17/2025   INFLUENZA VACCINE  Completed   DEXA SCAN  Completed   COVID-19 Vaccine  Completed   Zoster Vaccines- Shingrix  Completed   HPV VACCINES  Aged Out    Health Maintenance  Health Maintenance Due  Topic Date Due   Hepatitis C Screening  Never done    Colorectal cancer screening: Type of screening: Colonoscopy. Completed 2016. Repeat every 10 years  Mammogram status: Completed 03/28/21. Repeat every year  Bone Density status: Ordered today. Pt provided with contact info and advised to call to schedule appt.  Lung Cancer Screening: (Low Dose CT Chest recommended if Age 40-80 years, 30 pack-year currently smoking OR have quit w/in 15years.) does not qualify.   Lung Cancer Screening Referral: na  Additional Screening:  Hepatitis C Screening: does qualify; Complete with next blood work  Vision Screening: Recommended annual ophthalmology exams for early detection of glaucoma and other disorders of the eye. Is the patient up to date with their annual eye exam?  Yes  Who is the provider or what is the name of the office in which the patient attends annual eye exams? [facility] If pt is not established with a provider, would they like to be referred to a provider to establish care? No .   Dental Screening: Recommended annual dental exams for proper oral hygiene  Community Resource Referral / Chronic Care Management: CRR required this visit?  No   CCM required this visit?  No      Plan:     I have personally reviewed and noted the following in the patient's chart:   Medical and social history Use of alcohol, tobacco or illicit drugs  Current medications and supplements including opioid prescriptions.  Functional ability and status Nutritional status Physical activity Advanced directives List of other physicians Hospitalizations, surgeries, and ER visits in  previous 12 months Vitals Screenings to include cognitive, depression, and falls Referrals and appointments  In addition, I have reviewed and discussed with patient certain preventive protocols, quality metrics, and best practice recommendations. A written personalized care plan for preventive services as well as general preventive health recommendations were provided to patient.     Lauree Chandler, NP   05/27/2021    Virtual Visit via Telephone Note  I connected withNAME@ on 05/27/21 at  8:30 AM EDT by telephone and verified that I am speaking with the correct person using two identifiers.  Location: Patient: home Provider: [facility]   I discussed the limitations, risks, security and privacy concerns of performing an evaluation and management service by telephone and the availability of in person appointments. I also discussed with the patient that there may be a patient responsible charge related to this service. The patient expressed understanding and agreed to proceed.   I discussed the assessment and treatment plan with the patient. The patient was provided an opportunity to ask questions and all were answered. The patient agreed with the plan and demonstrated an understanding of the instructions.   The patient was advised to call back or seek an in-person evaluation if the symptoms worsen or if the condition fails to improve as anticipated.  I provided 18 minutes of non-face-to-face time during this encounter.  Carlos American.  Harle [facility] printed and mailed

## 2021-06-06 ENCOUNTER — Ambulatory Visit (INDEPENDENT_AMBULATORY_CARE_PROVIDER_SITE_OTHER): Payer: 59 | Admitting: Nurse Practitioner

## 2021-06-06 ENCOUNTER — Other Ambulatory Visit: Payer: Self-pay

## 2021-06-06 ENCOUNTER — Encounter: Payer: Self-pay | Admitting: Nurse Practitioner

## 2021-06-06 VITALS — BP 126/88 | HR 77 | Temp 97.9°F | Ht 66.5 in | Wt 172.0 lb

## 2021-06-06 DIAGNOSIS — M1990 Unspecified osteoarthritis, unspecified site: Secondary | ICD-10-CM

## 2021-06-06 DIAGNOSIS — R197 Diarrhea, unspecified: Secondary | ICD-10-CM

## 2021-06-06 DIAGNOSIS — N8110 Cystocele, unspecified: Secondary | ICD-10-CM | POA: Diagnosis not present

## 2021-06-06 MED ORDER — TRAMADOL HCL 50 MG PO TABS: 50.0000 mg | ORAL_TABLET | Freq: Two times a day (BID) | ORAL | 0 refills | Status: DC | PRN

## 2021-06-06 NOTE — Progress Notes (Signed)
Careteam: Patient Care Team: Lauree Chandler, NP as PCP - General (Geriatric Medicine) Deboraha Sprang, MD as PCP - Electrophysiology (Cardiology) Katherine Mantle, OD (Optometry)  PLACE OF SERVICE:  [facility]  Advanced Directive information    Allergies  Allergen Reactions   Hydrocodone Nausea And Vomiting   Lisinopril     Sick on the stomach    Chief Complaint  Patient presents with   Acute Visit    Patient c/o diarrhea and requesting referral      HPI: Patient is a 70 y.o. female  She has hx of bladder prolapse and has a pessary however still is coming down and out and causes irritation and bleeding. Hurts her and is very uncomfortable when he is up and walking at night. When she is sitting it does not bother her.  Went to GYN who told her nothing was wrong and would like another option because it is very uncomfortable for her when she walks and is at work. Painful to urinate when she has this problem. Worse when she works and thought about quitting work due to this.  Also will have episodes of frequency and urgency that will last for ~3 hours then improve   Having diarrhea at least 3 times a week. Has been going on for at least 2 months. Sometimes she will have regular bowel movements.  Last week one day she had several episodes of watery stools.  She has tried to change her diet to help.  Has been trying to keep a food journal.  Not related to eating.  Review of Systems:  Review of Systems  Constitutional:  Negative for chills, fever and weight loss.  HENT:  Negative for tinnitus.   Respiratory:  Negative for cough, sputum production and shortness of breath.   Cardiovascular:  Negative for chest pain, palpitations and leg swelling.  Gastrointestinal:  Positive for diarrhea. Negative for abdominal pain, constipation and heartburn.  Genitourinary:  Positive for dysuria, frequency and urgency.  Musculoskeletal:  Positive for joint pain. Negative for back pain,  falls and myalgias.  Skin: Negative.   Neurological:  Negative for dizziness and headaches.   Past Medical History:  Diagnosis Date   Anxiety    Arthritis    osteoarthritis   Breast cancer ([place]) 2008   right, ER/PR -   CHF (congestive heart failure) ([place])    Depression    Diabetes mellitus    TYPE 2   Gastroparesis    Hx of radiation therapy 04/04/07 to 05/20/07   right breast/6260 cGy   Hypercholesterolemia    Hypertension    Lumbago    Malignant neoplasm of breast (female), unspecified site    Nonspecific elevation of levels of transaminase or lactic acid dehydrogenase (LDH)    Osteoarthrosis, unspecified whether generalized or localized, unspecified site    Regional enteritis of unspecified site    Restless legs syndrome (RLS)    TIA (transient ischemic attack)    Urinary frequency    Uterine prolapse without mention of vaginal wall prolapse    Vitamin deficiency    Past Surgical History:  Procedure Laterality Date   ABDOMINAL HYSTERECTOMY  1997   TAH/BSO, ENDOMETRIAL SARCOMA   ARTHROPLASTY     left knee   BREAST LUMPECTOMY Right 11/17/06   re-excision 01/13/07   CATARACT EXTRACTION Right 08/2019   CATARACT EXTRACTION Left 07/2019   TOTAL KNEE ARTHROPLASTY Left 06/10/2011       TOTAL KNEE ARTHROPLASTY Right 2006, 2009  Dr Para March   TOTAL KNEE ARTHROPLASTY Left 2008   Dr Para March   WRIST SURGERY     carpel tunnel   Social History:   reports that she quit smoking about 29 years ago. Her smoking use included cigarettes. She has a 10.00 pack-year smoking history. She has never used smokeless tobacco. She reports that she does not drink alcohol and does not use drugs.  Family History  Problem Relation Age of Onset   Diabetes Mother    Diabetes Father    Aneurysm Sister    Arthritis Sister    Diabetes Brother    Heart Problems Brother    Colon cancer Neg Hx     Medications: Patient's Medications  New Prescriptions   No medications on file  Previous Medications    ACCU-CHEK FASTCLIX LANCETS MISC    Check blood sugar once daily as directed E11.59   ACCU-CHEK GUIDE TEST STRIP    CHECK BLOOD SUGAR ONCE     DAILY AS DIRECTED.   AMLODIPINE (NORVASC) 10 MG TABLET    TAKE 1/2 TABLET ($RemoveBef'5MG'[id number]$  TOTAL)DAILY   APIXABAN (ELIQUIS) 5 MG TABS TABLET    Take 1 tablet (5 mg total) by mouth 2 (two) times daily.   ASCORBIC ACID (VITAMIN C) 500 MG TABLET    Take 1 tablet by mouth daily.   BLOOD GLUCOSE MONITORING SUPPL (ACCU-CHEK AVIVA PLUS) W/DEVICE KIT    Check blood sugar once daily as directed E11.59  Acc-Chek guide   CARVEDILOL (COREG) 25 MG TABLET    Take 1 tablet (25 mg total) by mouth 2 (two) times daily with a meal.   FERROUS SULFATE (IRON) 325 (65 FE) MG TABS    Take by mouth daily.   LOSARTAN (COZAAR) 100 MG TABLET    TAKE 1 TABLET BY MOUTH DAILY FOR BLOOD PRESSURE.   MENTHOL, TOPICAL ANALGESIC, (BIOFREEZE) 4 % GEL    Apply 3 oz topically 3 (three) times daily as needed. Apply to both knees for pain   METFORMIN (GLUCOPHAGE) 1000 MG TABLET    TAKE 1 TABLET TWICE DAILY  WITH MEALS   ROPINIROLE (REQUIP) 0.25 MG TABLET    TAKE 1 TABLET AT BEDTIME   FOR RESTLESS LEGS   ROSUVASTATIN (CRESTOR) 40 MG TABLET    TAKE 1 TABLET DAILY   TRAMADOL (ULTRAM) 50 MG TABLET    TAKE 1 TABLET BY MOUTH EVERY 12 HOURS AS NEEDED FOR SEVERE PAIN.   TRIAMCINOLONE OINTMENT (KENALOG) 0.1 %    as needed.  Modified Medications   No medications on file  Discontinued Medications   No medications on file    Physical Exam:  Vitals:   06/06/21 0800  BP: 126/88  Pulse: 77  Temp: 97.9 F (36.6 C)  TempSrc: Temporal  SpO2: 98%  Weight: 172 lb (78 kg)  Height: 5' 6.5" (1.689 m)   Body mass index is 27.35 kg/m. Wt Readings from Last 3 Encounters:  06/06/21 172 lb (78 kg)  05/27/21 168 lb (76.2 kg)  05/15/21 173 lb (78.5 kg)    Physical Exam Constitutional:      General: She is not in acute distress.    Appearance: She is well-developed. She is not diaphoretic.  HENT:     Head:  Normocephalic and atraumatic.     Mouth/Throat:     Pharynx: No oropharyngeal exudate.  Eyes:     Conjunctiva/sclera: Conjunctivae normal.     Pupils: Pupils are equal, round, and reactive to light.  Cardiovascular:  Rate and Rhythm: Normal rate and regular rhythm.     Heart sounds: Normal heart sounds.  Pulmonary:     Effort: Pulmonary effort is normal.     Breath sounds: Normal breath sounds.  Abdominal:     General: Bowel sounds are normal.     Palpations: Abdomen is soft.  Musculoskeletal:     Cervical back: Normal range of motion and neck supple.     Right lower leg: No edema.     Left lower leg: No edema.  Skin:    General: Skin is warm and dry.  Neurological:     Mental Status: She is alert.  Psychiatric:        Mood and Affect: Mood normal.    Labs reviewed: Basic Metabolic Panel: Recent Labs    07/02/20 1659 07/17/20 1444 11/15/20 0948 05/07/21 0849  NA  --  142 142 140  K  --  4.1 3.7 3.9  CL  --  109 108 109  CO2  --  $R'23 23 23  'Rc$ GLUCOSE  --  111* 87 86  BUN  --  30* 22 30*  CREATININE  --  1.17* 1.19* 1.20*  CALCIUM  --  9.4 9.4 9.7  TSH 0.556  --   --   --    Liver Function Tests: Recent Labs    07/17/20 1444 11/15/20 0948 05/07/21 0849  AST $Re'19 18 24  'AIe$ ALT $R'14 16 22  'Kk$ BILITOT 0.4 0.6 0.5  PROT 6.6 6.7 6.7   No results for input(s): LIPASE, AMYLASE in the last 8760 hours. No results for input(s): AMMONIA in the last 8760 hours. CBC: Recent Labs    07/17/20 1444 07/23/20 0814 11/15/20 0948 05/07/21 0849  WBC 4.4 5.1 3.9 4.9  NEUTROABS 2,618  --  1,966 2,680  HGB 10.9* 10.7* 11.8 10.8*  HCT 32.0* 30.8* 34.6* 33.1*  MCV 94.1 92.5 93.8 93.2  PLT 182 183 209 216   Lipid Panel: Recent Labs    07/17/20 1444 11/15/20 0948  CHOL 194 175  HDL 91 81  LDLCALC 86 79  TRIG 77 72  CHOLHDL 2.1 2.2   TSH: Recent Labs    07/02/20 1659  TSH 0.556   A1C: Lab Results  Component Value Date   HGBA1C 6.4 (H) 05/07/2021      Assessment/Plan 1. Osteoarthritis, unspecified osteoarthritis type, unspecified site Doing well with tramadol, will not take every day but if pain severe. Uses tylenol as well.  - traMADol (ULTRAM) 50 MG tablet; Take 1 tablet (50 mg total) by mouth every 12 (twelve) hours as needed.  Dispense: 60 tablet; Refill: 0  2. Bladder prolapse, female, acquired -ongoing, has pessary but will have pain, irritation and bleeding. Would like another evaluation at this time.  - Ambulatory referral to Urogynecology  3. Diarrhea, unspecified type -?if this is coming from diet or medication.  She has been on metformin which is high on possibility. A1c at goal. Will have her take metamucil or benefiber 1-2 times daily to see if this improves symptoms. May need to reduce metformin.    Next appt: 10/24/2021 as scheduled, sooner if needed  Jacquie Lukes K. [place], [facility] [phone]

## 2021-06-06 NOTE — Patient Instructions (Signed)
Add benefiber or metamucil 1- 2 times daily in liquid of choice

## 2021-07-04 ENCOUNTER — Other Ambulatory Visit: Payer: Self-pay | Admitting: Internal Medicine

## 2021-07-14 ENCOUNTER — Other Ambulatory Visit: Payer: Self-pay | Admitting: *Deleted

## 2021-07-14 MED ORDER — METFORMIN HCL 1000 MG PO TABS: 1000.0000 mg | ORAL_TABLET | Freq: Two times a day (BID) | ORAL | 1 refills | Status: DC

## 2021-07-14 NOTE — Telephone Encounter (Signed)
Patient requested refill to be sent to [facility].

## 2021-08-27 ENCOUNTER — Other Ambulatory Visit: Payer: Self-pay | Admitting: Nurse Practitioner

## 2021-08-27 DIAGNOSIS — M1990 Unspecified osteoarthritis, unspecified site: Secondary | ICD-10-CM

## 2021-08-27 NOTE — Telephone Encounter (Signed)
RX last refilled on 06/06/2021  No treatment agreement on file, notation patient on pending appointment for 10/24/20

## 2021-09-07 ENCOUNTER — Other Ambulatory Visit: Payer: Self-pay | Admitting: Internal Medicine

## 2021-09-07 DIAGNOSIS — I480 Paroxysmal atrial fibrillation: Secondary | ICD-10-CM

## 2021-09-09 NOTE — Telephone Encounter (Signed)
Pt last saw [name], [place] on 05/15/21, last labs 05/07/21 Creat 1.2, age 71, weight 78kg, based on specified criteria pt is on appropriate dosage of Eliquis 5mg  BID for afib.  Will refill rx.

## 2021-09-26 ENCOUNTER — Other Ambulatory Visit: Payer: 59

## 2021-10-02 ENCOUNTER — Other Ambulatory Visit: Payer: Self-pay | Admitting: Nurse Practitioner

## 2021-10-02 DIAGNOSIS — G2581 Restless legs syndrome: Secondary | ICD-10-CM

## 2021-10-24 ENCOUNTER — Other Ambulatory Visit: Payer: Self-pay

## 2021-10-24 ENCOUNTER — Ambulatory Visit (INDEPENDENT_AMBULATORY_CARE_PROVIDER_SITE_OTHER): Payer: 59 | Admitting: Nurse Practitioner

## 2021-10-24 ENCOUNTER — Encounter: Payer: Self-pay | Admitting: Nurse Practitioner

## 2021-10-24 VITALS — BP 158/92 | HR 74 | Temp 97.9°F | Ht 66.5 in | Wt 172.0 lb

## 2021-10-24 DIAGNOSIS — M1990 Unspecified osteoarthritis, unspecified site: Secondary | ICD-10-CM

## 2021-10-24 DIAGNOSIS — I10 Essential (primary) hypertension: Secondary | ICD-10-CM

## 2021-10-24 DIAGNOSIS — E1122 Type 2 diabetes mellitus with diabetic chronic kidney disease: Secondary | ICD-10-CM

## 2021-10-24 DIAGNOSIS — N8110 Cystocele, unspecified: Secondary | ICD-10-CM | POA: Diagnosis not present

## 2021-10-24 DIAGNOSIS — E1169 Type 2 diabetes mellitus with other specified complication: Secondary | ICD-10-CM

## 2021-10-24 DIAGNOSIS — D638 Anemia in other chronic diseases classified elsewhere: Secondary | ICD-10-CM | POA: Diagnosis not present

## 2021-10-24 DIAGNOSIS — Z1159 Encounter for screening for other viral diseases: Secondary | ICD-10-CM

## 2021-10-24 DIAGNOSIS — N1831 Chronic kidney disease, stage 3a: Secondary | ICD-10-CM

## 2021-10-24 DIAGNOSIS — E785 Hyperlipidemia, unspecified: Secondary | ICD-10-CM

## 2021-10-24 DIAGNOSIS — N183 Chronic kidney disease, stage 3 unspecified: Secondary | ICD-10-CM

## 2021-10-24 NOTE — Progress Notes (Signed)
Careteam: Patient Care Team: Lauree Chandler, NP as PCP - General (Geriatric Medicine) Deboraha Sprang, MD as PCP - Electrophysiology (Cardiology) Katherine Mantle, OD (Optometry)  PLACE OF SERVICE:  [place] Directive information Does Patient Have a Medical Advance Directive?: No, Would patient like information on creating a medical advance directive?: Yes (MAU/Ambulatory/Procedural Areas - Information given)  Allergies  Allergen Reactions   Hydrocodone Nausea And Vomiting   Lisinopril     Sick on the stomach    Chief Complaint  Patient presents with   Medical Management of Chronic Issues    6 month follow-up and sign treatment agreement. Discuss need for covid boosters and hep c screening or post pone if patient refuses. Discuss HCPOA/LW, patient has documents from a previous visit.      HPI: Patient is a 71 y.o. female for routine follow up  Has felt bad with all COVID vaccine, she has had 4. Unsure if she wants to get booster  Pain is well controlled on tramadol, only taking when she works, she is planning on retiring at end of march. She has stomach issues and body feels bad when she has to work long hours.  She feels good when she only has to work 8 hours but if they are low staffed she has to work up to 12 hours and that is hard.   DM- continues on metformin 1000 mg BID, no hypoglycemia   Htn- controlled on norvasc, losartan and coreg.   Hyperlipidemia- on statin, no side effects.   RLS- controlled with requip.    Review of Systems:  Review of Systems  Constitutional:  Negative for chills, fever and weight loss.  HENT:  Negative for tinnitus.   Respiratory:  Negative for cough, sputum production and shortness of breath.   Cardiovascular:  Negative for chest pain, palpitations and leg swelling.  Gastrointestinal:  Negative for abdominal pain, constipation, diarrhea and heartburn.  Genitourinary:  Negative for dysuria, frequency and urgency.   Musculoskeletal:  Positive for joint pain (with increase in activity). Negative for back pain, falls and myalgias.  Skin: Negative.   Neurological:  Negative for dizziness and headaches.  Psychiatric/Behavioral:  Negative for depression and memory loss. The patient does not have insomnia.    Past Medical History:  Diagnosis Date   Anxiety    Arthritis    osteoarthritis   Breast cancer ([place]) 2008   right, ER/PR -   CHF (congestive heart failure) ([place])    Depression    Diabetes mellitus    TYPE 2   Gastroparesis    Hx of radiation therapy 04/04/07 to 05/20/07   right breast/6260 cGy   Hypercholesterolemia    Hypertension    Lumbago    Malignant neoplasm of breast (female), unspecified site    Nonspecific elevation of levels of transaminase or lactic acid dehydrogenase (LDH)    Osteoarthrosis, unspecified whether generalized or localized, unspecified site    Regional enteritis of unspecified site    Restless legs syndrome (RLS)    TIA (transient ischemic attack)    Urinary frequency    Uterine prolapse without mention of vaginal wall prolapse    Vitamin deficiency    Past Surgical History:  Procedure Laterality Date   ABDOMINAL HYSTERECTOMY  1997   TAH/BSO, ENDOMETRIAL SARCOMA   ARTHROPLASTY     left knee   BREAST LUMPECTOMY Right 11/17/06   re-excision 01/13/07   CATARACT EXTRACTION Right 08/2019   CATARACT EXTRACTION Left 07/2019  TOTAL KNEE ARTHROPLASTY Left 06/10/2011       TOTAL KNEE ARTHROPLASTY Right 2006, 2009   Dr Para [place]   TOTAL KNEE ARTHROPLASTY Left 2008   Dr Para [place]   WRIST SURGERY     carpel tunnel   Social History:   reports that she quit smoking about 30 years ago. Her smoking use included cigarettes. She has a 10.00 pack-year smoking history. She has never used smokeless tobacco. She reports that she does not drink alcohol and does not use drugs.  Family History  Problem Relation Age of Onset   Diabetes Mother    Diabetes Father    Aneurysm Sister     Arthritis Sister    Diabetes Brother    Heart Problems Brother    Colon cancer Neg Hx     Medications: Patient's Medications  New Prescriptions   No medications on file  Previous Medications   ACCU-CHEK FASTCLIX LANCETS MISC    Check blood sugar once daily as directed E11.59   ACCU-CHEK GUIDE TEST STRIP    CHECK BLOOD SUGAR ONCE     DAILY AS DIRECTED.   AMLODIPINE (NORVASC) 10 MG TABLET    TAKE 1/2 TABLET ($RemoveBef'5MG'[id number]$  TOTAL)DAILY   ASCORBIC ACID (VITAMIN C) 500 MG TABLET    Take 1 tablet by mouth daily.   BLOOD GLUCOSE MONITORING SUPPL (ACCU-CHEK AVIVA PLUS) W/DEVICE KIT    Check blood sugar once daily as directed E11.59  Acc-Chek guide   CARVEDILOL (COREG) 25 MG TABLET    Take 1 tablet (25 mg total) by mouth 2 (two) times daily with a meal.   ELIQUIS 5 MG TABS TABLET    TAKE 1 TABLET BY MOUTH TWICE A DAY   FERROUS SULFATE (IRON) 325 (65 FE) MG TABS    Take by mouth daily.   LOSARTAN (COZAAR) 100 MG TABLET    TAKE 1 TABLET BY MOUTH DAILY FOR BLOOD PRESSURE.   MENTHOL, TOPICAL ANALGESIC, (BIOFREEZE) 4 % GEL    Apply 3 oz topically 3 (three) times daily as needed. Apply to both knees for pain   METFORMIN (GLUCOPHAGE) 1000 MG TABLET    Take 1 tablet (1,000 mg total) by mouth 2 (two) times daily with a meal.   ROPINIROLE (REQUIP) 0.25 MG TABLET    TAKE 1 TABLET AT BEDTIME   FOR RESTLESS LEGS   ROSUVASTATIN (CRESTOR) 40 MG TABLET    TAKE 1 TABLET DAILY   TRAMADOL (ULTRAM) 50 MG TABLET    TAKE 1 TABLET BY MOUTH EVERY 12 HOURS AS NEEDED.   TRIAMCINOLONE OINTMENT (KENALOG) 0.1 %    as needed.  Modified Medications   No medications on file  Discontinued Medications   No medications on file    Physical Exam:  Vitals:   10/24/21 0757  BP: (!) 158/92  Pulse: 74  Temp: 97.9 F (36.6 C)  TempSrc: Temporal  SpO2: 97%  Weight: 172 lb (78 kg)  Height: 5' 6.5" (1.689 m)   Body mass index is 27.35 kg/m. Wt Readings from Last 3 Encounters:  10/24/21 172 lb (78 kg)  06/06/21 172 lb (78 kg)   05/27/21 168 lb (76.2 kg)    Physical Exam Constitutional:      General: She is not in acute distress.    Appearance: She is well-developed. She is not diaphoretic.  HENT:     Head: Normocephalic and atraumatic.     Mouth/Throat:     Pharynx: No oropharyngeal exudate.  Eyes:     Conjunctiva/sclera: Conjunctivae  normal.     Pupils: Pupils are equal, round, and reactive to light.  Cardiovascular:     Rate and Rhythm: Normal rate and regular rhythm.     Heart sounds: Normal heart sounds.  Pulmonary:     Effort: Pulmonary effort is normal.     Breath sounds: Normal breath sounds.  Abdominal:     General: Bowel sounds are normal.     Palpations: Abdomen is soft.  Musculoskeletal:     Cervical back: Normal range of motion and neck supple.     Right lower leg: No edema.     Left lower leg: No edema.  Skin:    General: Skin is warm and dry.  Neurological:     Mental Status: She is alert.  Psychiatric:        Mood and Affect: Mood normal.    Labs reviewed: Basic Metabolic Panel: Recent Labs    11/15/20 0948 05/07/21 0849  NA 142 140  K 3.7 3.9  CL 108 109  CO2 23 23  GLUCOSE 87 86  BUN 22 30*  CREATININE 1.19* 1.20*  CALCIUM 9.4 9.7   Liver Function Tests: Recent Labs    11/15/20 0948 05/07/21 0849  AST 18 24  ALT 16 22  BILITOT 0.6 0.5  PROT 6.7 6.7   No results for input(s): LIPASE, AMYLASE in the last 8760 hours. No results for input(s): AMMONIA in the last 8760 hours. CBC: Recent Labs    11/15/20 0948 05/07/21 0849  WBC 3.9 4.9  NEUTROABS 1,966 2,680  HGB 11.8 10.8*  HCT 34.6* 33.1*  MCV 93.8 93.2  PLT 209 216   Lipid Panel: Recent Labs    11/15/20 0948  CHOL 175  HDL 81  LDLCALC 79  TRIG 72  CHOLHDL 2.2   TSH: No results for input(s): TSH in the last 8760 hours. A1C: Lab Results  Component Value Date   HGBA1C 6.4 (H) 05/07/2021     Assessment/Plan 1. Osteoarthritis, unspecified osteoarthritis type, unspecified site -stable  on current regimen, uses tramadol PRN -contract updated today  2. Bladder prolapse, female, acquired -stable, doing better with recent follow up to GYN.  3. CKD stage 3 due to type 2 diabetes mellitus (HCC) Chronic and stable Encourage proper hydration Follow metabolic panel Avoid nephrotoxic meds (NSAIDS)  4. Anemia, chronic disease -continues on supplement  - CBC with Differential/Platelet  5. Type 2 diabetes mellitus with stage 3a chronic kidney disease, without long-term current use of insulin ([place]) -Encouraged dietary compliance, routine foot care/monitoring and to keep up with diabetic eye exams through ophthalmology  -continues on metformin twice daily - Hemoglobin A1c  6. Essential hypertension -Blood pressure elevated today but typically well controlled -home blood pressures are well controlled -No changes to medications today  -will have pt continue to monitor home bp goal <140/90, to notify if readings remain high on 3 different days  -follow metabolic panel - CBC with Differential/Platelet - CMP with eGFR([facility])  7. Need for hepatitis C screening test - Hepatitis C antibody  8. Hyperlipidemia associated with type 2 diabetes mellitus ([place]) Continues on crestor 40 mg daily with dietary modifications.  - Lipid panel   Return in about 6 months (around 04/23/2022) for routine follow up and most form . Carlos American. [place], [facility] [phone]

## 2021-10-27 ENCOUNTER — Other Ambulatory Visit: Payer: Self-pay

## 2021-10-27 LAB — COMPLETE METABOLIC PANEL WITH GFR
AG Ratio: 1.5 (calc) (ref 1.0–2.5)
ALT: 21 U/L (ref 6–29)
AST: 29 U/L (ref 10–35)
Albumin: 4.4 g/dL (ref 3.6–5.1)
Alkaline phosphatase (APISO): 67 U/L (ref 37–153)
BUN/Creatinine Ratio: 23 (calc) — ABNORMAL HIGH (ref 6–22)
BUN: 31 mg/dL — ABNORMAL HIGH (ref 7–25)
CO2: 24 mmol/L (ref 20–32)
Calcium: 10 mg/dL (ref 8.6–10.4)
Chloride: 108 mmol/L (ref 98–110)
Creat: 1.34 mg/dL — ABNORMAL HIGH (ref 0.60–1.00)
Globulin: 2.9 g/dL (calc) (ref 1.9–3.7)
Glucose, Bld: 75 mg/dL (ref 65–99)
Potassium: 3.9 mmol/L (ref 3.5–5.3)
Sodium: 142 mmol/L (ref 135–146)
Total Bilirubin: 0.5 mg/dL (ref 0.2–1.2)
Total Protein: 7.3 g/dL (ref 6.1–8.1)
eGFR: 42 mL/min/{1.73_m2} — ABNORMAL LOW (ref >=60)

## 2021-10-27 LAB — HEPATITIS C ANTIBODY
Hepatitis C Ab: NONREACTIVE
SIGNAL TO CUT-OFF: 0.04 (ref <1.00)

## 2021-10-27 LAB — LIPID PANEL
Cholesterol: 205 mg/dL — ABNORMAL HIGH (ref <200)
HDL: 103 mg/dL (ref >=50)
LDL Cholesterol (Calc): 89 mg/dL (calc)
Non-HDL Cholesterol (Calc): 102 mg/dL (calc) (ref <130)
Total CHOL/HDL Ratio: 2 (calc) (ref <5.0)
Triglycerides: 51 mg/dL (ref <150)

## 2021-10-27 LAB — CBC WITH DIFFERENTIAL/PLATELET
Absolute Monocytes: 320 cells/uL (ref 200–950)
Basophils Absolute: 20 cells/uL (ref 0–200)
Basophils Relative: 0.5 %
Eosinophils Absolute: 90 cells/uL (ref 15–500)
Eosinophils Relative: 2.3 %
HCT: 34.2 % — ABNORMAL LOW (ref 35.0–45.0)
Hemoglobin: 11.4 g/dL — ABNORMAL LOW (ref 11.7–15.5)
Lymphs Abs: 1314 cells/uL (ref 850–3900)
MCH: 31.5 pg (ref 27.0–33.0)
MCHC: 33.3 g/dL (ref 32.0–36.0)
MCV: 94.5 fL (ref 80.0–100.0)
MPV: 11.1 fL (ref 7.5–12.5)
Monocytes Relative: 8.2 %
Neutro Abs: 2157 cells/uL (ref 1500–7800)
Neutrophils Relative %: 55.3 %
Platelets: 213 10*3/uL (ref 140–400)
RBC: 3.62 10*6/uL — ABNORMAL LOW (ref 3.80–5.10)
RDW: 14.1 % (ref 11.0–15.0)
Total Lymphocyte: 33.7 %
WBC: 3.9 10*3/uL (ref 3.8–10.8)

## 2021-10-27 LAB — HEMOGLOBIN A1C
Hgb A1c MFr Bld: 6.3 % of total Hgb — ABNORMAL HIGH (ref <5.7)
Mean Plasma Glucose: 134 mg/dL
eAG (mmol/L): 7.4 mmol/L

## 2021-10-28 ENCOUNTER — Other Ambulatory Visit: Payer: Self-pay | Admitting: Nurse Practitioner

## 2021-11-14 ENCOUNTER — Other Ambulatory Visit: Payer: Self-pay

## 2021-11-14 DIAGNOSIS — G2581 Restless legs syndrome: Secondary | ICD-10-CM

## 2021-11-14 DIAGNOSIS — E1169 Type 2 diabetes mellitus with other specified complication: Secondary | ICD-10-CM

## 2021-11-14 DIAGNOSIS — E785 Hyperlipidemia, unspecified: Secondary | ICD-10-CM

## 2021-11-14 DIAGNOSIS — M1990 Unspecified osteoarthritis, unspecified site: Secondary | ICD-10-CM

## 2021-11-14 MED ORDER — ROPINIROLE HCL 0.25 MG PO TABS: ORAL_TABLET | ORAL | 1 refills | Status: DC

## 2021-11-14 MED ORDER — TRAMADOL HCL 50 MG PO TABS: 50.0000 mg | ORAL_TABLET | Freq: Two times a day (BID) | ORAL | 0 refills | Status: DC | PRN

## 2021-11-14 MED ORDER — ROSUVASTATIN CALCIUM 40 MG PO TABS: 40.0000 mg | ORAL_TABLET | Freq: Every day | ORAL | 3 refills | Status: DC

## 2021-11-14 MED ORDER — LOSARTAN POTASSIUM 100 MG PO TABS: 100.0000 mg | ORAL_TABLET | Freq: Every day | ORAL | 3 refills | Status: DC

## 2021-11-14 MED ORDER — AMLODIPINE BESYLATE 10 MG PO TABS: ORAL_TABLET | ORAL | 1 refills | Status: DC

## 2021-11-14 MED ORDER — METFORMIN HCL 1000 MG PO TABS: 1000.0000 mg | ORAL_TABLET | Freq: Two times a day (BID) | ORAL | 1 refills | Status: DC

## 2021-11-14 MED ORDER — CARVEDILOL 25 MG PO TABS: 25.0000 mg | ORAL_TABLET | Freq: Two times a day (BID) | ORAL | 1 refills | Status: DC

## 2021-11-14 NOTE — Telephone Encounter (Signed)
Refill request received from [facility].  Tramadol came up with allergy alert. ?Medication pended and sent to Windell Moulding, NP (covering provider) for approval ?

## 2021-11-21 ENCOUNTER — Other Ambulatory Visit: Payer: Self-pay | Admitting: *Deleted

## 2021-11-21 DIAGNOSIS — I480 Paroxysmal atrial fibrillation: Secondary | ICD-10-CM

## 2021-11-21 MED ORDER — APIXABAN 5 MG PO TABS: 5.0000 mg | ORAL_TABLET | Freq: Two times a day (BID) | ORAL | 1 refills | Status: DC

## 2021-11-21 NOTE — Telephone Encounter (Signed)
Eliquis '5mg'$  paper refill request received from [facility]. Patient is 71 years old, weight-78kg, Crea-1.34 on 10/24/2021, Diagnosis-Afib, and last seen by Oda Kilts on 05/15/2021. Dose is appropriate based on dosing criteria. Will send in refill to requested pharmacy.   ?

## 2021-12-26 DIAGNOSIS — N3941 Urge incontinence: Secondary | ICD-10-CM | POA: Diagnosis not present

## 2021-12-26 DIAGNOSIS — R350 Frequency of micturition: Secondary | ICD-10-CM | POA: Diagnosis not present

## 2022-01-13 ENCOUNTER — Ambulatory Visit
Admission: RE | Admit: 2022-01-13 | Discharge: 2022-01-13 | Disposition: A | Discharge status: Home / Self Care | Payer: Medicare HMO | Source: Ambulatory Visit | Attending: Nurse Practitioner | Admitting: Nurse Practitioner

## 2022-01-13 ENCOUNTER — Other Ambulatory Visit: Payer: Self-pay

## 2022-01-13 ENCOUNTER — Other Ambulatory Visit: Payer: Self-pay | Admitting: Nurse Practitioner

## 2022-01-13 DIAGNOSIS — Z1231 Encounter for screening mammogram for malignant neoplasm of breast: Secondary | ICD-10-CM

## 2022-01-13 DIAGNOSIS — E2839 Other primary ovarian failure: Secondary | ICD-10-CM

## 2022-01-13 DIAGNOSIS — M85852 Other specified disorders of bone density and structure, left thigh: Secondary | ICD-10-CM | POA: Diagnosis not present

## 2022-01-13 DIAGNOSIS — Z780 Asymptomatic menopausal state: Secondary | ICD-10-CM | POA: Diagnosis not present

## 2022-01-13 LAB — HM DEXA SCAN

## 2022-02-05 ENCOUNTER — Other Ambulatory Visit: Payer: Self-pay | Admitting: Internal Medicine

## 2022-02-05 DIAGNOSIS — I480 Paroxysmal atrial fibrillation: Secondary | ICD-10-CM

## 2022-02-05 NOTE — Telephone Encounter (Signed)
Prescription refill request for Eliquis received. Indication:  Atrial Fib Last office visit: 05/15/21  Claudina Lick PA-C Scr: 1.34 on 10/24/21 Age: 71 Weight: 78.5kg  Based on above findings Eliquis '5mg'$  twice daily is the appropriate dose.  Refill approved.

## 2022-03-03 DIAGNOSIS — E119 Type 2 diabetes mellitus without complications: Secondary | ICD-10-CM | POA: Diagnosis not present

## 2022-03-03 LAB — HM DIABETES EYE EXAM

## 2022-03-05 ENCOUNTER — Encounter: Payer: Self-pay | Admitting: Nurse Practitioner

## 2022-03-30 ENCOUNTER — Other Ambulatory Visit: Payer: Self-pay | Admitting: Nurse Practitioner

## 2022-03-30 ENCOUNTER — Other Ambulatory Visit: Payer: Self-pay | Admitting: Orthopedic Surgery

## 2022-03-30 ENCOUNTER — Ambulatory Visit
Admission: RE | Admit: 2022-03-30 | Discharge: 2022-03-30 | Disposition: A | Discharge status: Home / Self Care | Payer: Medicare HMO | Source: Ambulatory Visit | Attending: Nurse Practitioner | Admitting: Nurse Practitioner

## 2022-03-30 DIAGNOSIS — Z1231 Encounter for screening mammogram for malignant neoplasm of breast: Secondary | ICD-10-CM

## 2022-03-30 DIAGNOSIS — M1990 Unspecified osteoarthritis, unspecified site: Secondary | ICD-10-CM

## 2022-03-30 DIAGNOSIS — G2581 Restless legs syndrome: Secondary | ICD-10-CM

## 2022-03-30 NOTE — Telephone Encounter (Signed)
Rx last filled 11/14/21  Treatment agreement on file from February 2023

## 2022-04-02 DIAGNOSIS — H524 Presbyopia: Secondary | ICD-10-CM | POA: Diagnosis not present

## 2022-04-23 NOTE — Progress Notes (Signed)
Careteam: Patient Care Team: Lauree Chandler, NP as PCP - General (Geriatric Medicine) Deboraha Sprang, MD as PCP - Electrophysiology (Cardiology) Katherine Mantle, OD (Optometry)  PLACE OF SERVICE:  [place] Directive information Does Patient Have a Medical Advance Directive?: No, Would patient like information on creating a medical advance directive?: No - Patient declined  Allergies  Allergen Reactions   Hydrocodone Nausea And Vomiting   Lisinopril     Sick on the stomach    Chief Complaint  Patient presents with   Medical Management of Chronic Issues    6 month follow-up and foot exam. Discuss need for covid boosters and flu vaccine (out of stock) or post pone if patient refuses. NCIR verified. 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 Out of [place] x 2-3 weeks      HPI: Patient is a 71 y.o. female for routine follow up.   Reports she retired and can not handle it. She is eating too much, can not sleep at night.  She has lost 5 lbs.  She back into the gym.   She said her grandchildren wants to be around her all the time.  She reports she needs to find something to do.   Blood pressure up today. Reports she just took her blood pressure medication prior to the appt.  Recheck better.   She is not sleeping at night- daughter lives with her at night or else she could get up and do things. She says she can go to bed at 7 am and sleep until 11 and be good.   Pain is much better now that she is not working.   Review of Systems:  Review of Systems  Constitutional:  Negative for chills, fever and weight loss.  HENT:  Negative for tinnitus.   Respiratory:  Negative for cough, sputum production and shortness of breath.   Cardiovascular:  Negative for chest pain, palpitations and leg swelling.  Gastrointestinal:  Negative for abdominal pain, constipation, diarrhea and heartburn.   Genitourinary:  Positive for frequency. Negative for dysuria and urgency.  Musculoskeletal:  Negative for back pain, falls, joint pain and myalgias.  Skin: Negative.   Neurological:  Negative for dizziness and headaches.  Psychiatric/Behavioral:  Negative for depression and memory loss. The patient has insomnia.     Past Medical History:  Diagnosis Date   Anxiety    Arthritis    osteoarthritis   Breast cancer ([place]) 2008   right, ER/PR -   CHF (congestive heart failure) ([place])    Depression    Diabetes mellitus    TYPE 2   Gastroparesis    Hx of radiation therapy 04/04/07 to 05/20/07   right breast/6260 cGy   Hypercholesterolemia    Hypertension    Lumbago    Malignant neoplasm of breast (female), unspecified site    Nonspecific elevation of levels of transaminase or lactic acid dehydrogenase (LDH)    Osteoarthrosis, unspecified whether generalized or localized, unspecified site    Regional enteritis of unspecified site    Restless legs syndrome (RLS)    TIA (transient ischemic attack)    Urinary frequency    Uterine prolapse without mention of vaginal wall prolapse    Vitamin deficiency    Past Surgical History:  Procedure Laterality Date   ABDOMINAL HYSTERECTOMY  1997   TAH/BSO, ENDOMETRIAL SARCOMA   ARTHROPLASTY     left knee   BREAST  LUMPECTOMY Right 11/17/06   re-excision 01/13/07   CATARACT EXTRACTION Right 08/2019   CATARACT EXTRACTION Left 07/2019   TOTAL KNEE ARTHROPLASTY Left 06/10/2011       TOTAL KNEE ARTHROPLASTY Right 2006, 2009   Dr Para March   TOTAL KNEE ARTHROPLASTY Left 2008   Dr Para March   WRIST SURGERY     carpel tunnel   Social History:   reports that she quit smoking about 30 years ago. Her smoking use included cigarettes. She has a 10.00 pack-year smoking history. She has never used smokeless tobacco. She reports that she does not drink alcohol and does not use drugs.  Family History  Problem Relation Age of Onset   Diabetes Mother    Diabetes  Father    Aneurysm Sister    Arthritis Sister    Diabetes Brother    Heart Problems Brother    Colon cancer Neg Hx     Medications: Patient's Medications  New Prescriptions   No medications on file  Previous Medications   ACCU-CHEK FASTCLIX LANCETS MISC    Check blood sugar once daily as directed E11.59   ACCU-CHEK GUIDE TEST STRIP    CHECK BLOOD SUGAR ONCE     DAILY AS DIRECTED.   AMLODIPINE (NORVASC) 10 MG TABLET    TAKE 1/2 TABLET EVERY DAY   ASCORBIC ACID (VITAMIN C) 500 MG TABLET    Take 1 tablet by mouth daily.   BLOOD GLUCOSE MONITORING SUPPL (ACCU-CHEK AVIVA PLUS) W/DEVICE KIT    Check blood sugar once daily as directed E11.59  Acc-Chek guide   CARVEDILOL (COREG) 25 MG TABLET    TAKE 1 TABLET TWICE DAILY WITH MEALS   ELIQUIS 5 MG TABS TABLET    TAKE 1 TABLET TWICE DAILY   FERROUS SULFATE (IRON) 325 (65 FE) MG TABS    Take by mouth daily.   LOSARTAN (COZAAR) 100 MG TABLET    Take 1 tablet (100 mg total) by mouth daily. for blood pressure   MENTHOL, TOPICAL ANALGESIC, (BIOFREEZE) 4 % GEL    Apply 3 oz topically 3 (three) times daily as needed. Apply to both knees for pain   METFORMIN (GLUCOPHAGE) 1000 MG TABLET    TAKE 1 TABLET TWICE DAILY WITH MEALS   ROPINIROLE (REQUIP) 0.25 MG TABLET    TAKE 1 TABLET AT BEDTIME FOR RESTLESS LEGS   ROSUVASTATIN (CRESTOR) 40 MG TABLET    Take 1 tablet (40 mg total) by mouth daily.   TRAMADOL (ULTRAM) 50 MG TABLET    TAKE 1 TABLET EVERY 12 HOURS AS NEEDED   TRIAMCINOLONE OINTMENT (KENALOG) 0.1 %    as needed.   VIBEGRON (GEMTESA) 75 MG TABS    Take 1 tablet by mouth daily.  Modified Medications   No medications on file  Discontinued Medications   No medications on file    Physical Exam:  Vitals:   04/24/22 0911  BP: (!) 162/84  Pulse: 66  Temp: 97.6 F (36.4 C)  TempSrc: Temporal  SpO2: 98%  Weight: 192 lb (87.1 kg)  Height: 5' 6.5" (1.689 m)   Body mass index is 30.53 kg/m. Wt Readings from Last 3 Encounters:  04/24/22 192 lb  (87.1 kg)  10/24/21 172 lb (78 kg)  06/06/21 172 lb (78 kg)    Physical Exam Constitutional:      General: She is not in acute distress.    Appearance: She is well-developed. She is not diaphoretic.  HENT:     Head: Normocephalic and  atraumatic.     Mouth/Throat:     Pharynx: No oropharyngeal exudate.  Eyes:     Conjunctiva/sclera: Conjunctivae normal.     Pupils: Pupils are equal, round, and reactive to light.  Cardiovascular:     Rate and Rhythm: Normal rate and regular rhythm.     Heart sounds: Normal heart sounds.  Pulmonary:     Effort: Pulmonary effort is normal.     Breath sounds: Normal breath sounds.  Abdominal:     General: Bowel sounds are normal.     Palpations: Abdomen is soft.  Musculoskeletal:     Cervical back: Normal range of motion and neck supple.     Right lower leg: No edema.     Left lower leg: No edema.  Skin:    General: Skin is warm and dry.  Neurological:     Mental Status: She is alert and oriented to person, place, and time. Mental status is at baseline.  Psychiatric:        Mood and Affect: Mood normal.     Labs reviewed: Basic Metabolic Panel: Recent Labs    05/07/21 0849 10/24/21 0830  NA 140 142  K 3.9 3.9  CL 109 108  CO2 23 24  GLUCOSE 86 75  BUN 30* 31*  CREATININE 1.20* 1.34*  CALCIUM 9.7 10.0   Liver Function Tests: Recent Labs    05/07/21 0849 10/24/21 0830  AST 24 29  ALT 22 21  BILITOT 0.5 0.5  PROT 6.7 7.3   No results for input(s): "LIPASE", "AMYLASE" in the last 8760 hours. No results for input(s): "AMMONIA" in the last 8760 hours. CBC: Recent Labs    05/07/21 0849 10/24/21 0830  WBC 4.9 3.9  NEUTROABS 2,680 2,157  HGB 10.8* 11.4*  HCT 33.1* 34.2*  MCV 93.2 94.5  PLT 216 213   Lipid Panel: Recent Labs    10/24/21 0830  CHOL 205*  HDL 103  LDLCALC 89  TRIG 51  CHOLHDL 2.0   TSH: No results for input(s): "TSH" in the last 8760 hours. A1C: Lab Results  Component Value Date   HGBA1C 6.3  (H) 10/24/2021     Assessment/Plan 1. Osteoarthritis, unspecified osteoarthritis type, unspecified site -stable, doing better since she is not working, continues to use tramadol as needed for severe pain but using less often.   2. CKD stage 3 due to type 2 diabetes mellitus (HCC) -Chronic and stable Encourage proper hydration Follow metabolic panel Avoid nephrotoxic meds (NSAIDS) - COMPLETE METABOLIC PANEL WITH GFR  3. Type 2 diabetes mellitus with stage 3a chronic kidney disease, without long-term current use of insulin (HCC) -Encouraged dietary compliance as she has gained weight and not adhered to dietary modifications recently, routine foot care/monitoring and to keep up with diabetic eye exams through ophthalmology  - Hemoglobin A1c  4. Essential hypertension -Blood pressure well controlled, goal bp <140/90 Continue current medications and dietary modifications follow metabolic panel - COMPLETE METABOLIC PANEL WITH GFR - CBC with Differential/Platelet  5. Anemia, chronic disease -continues on iron supplement  - CBC with Differential/Platelet  6. AF (paroxysmal atrial fibrillation) (HCC) -rate controlled, continues on eliquis for anticoagulation.  - CBC with Differential/Platelet  7. Restless leg syndrome Controlled with current regimen  8. Class 1 obesity due to excess calories with serious comorbidity and body mass index (BMI) of 30.0 to 30.9 in adult --education provided on healthy weight loss through increase in physical activity and proper nutrition   9. Bladder prolapse, female,  acquired -stable, continues to follow up with urogyn.  10. Insomnia, unspecified type -discussed lifestyle modifications, can use melatonin 1 mg daily    Return in about 6 months (around 10/25/2022) for routine follow up . Carlos American. [place], [facility] [phone]

## 2022-04-24 ENCOUNTER — Encounter: Payer: Self-pay | Admitting: Nurse Practitioner

## 2022-04-24 ENCOUNTER — Ambulatory Visit (INDEPENDENT_AMBULATORY_CARE_PROVIDER_SITE_OTHER): Payer: Medicare HMO | Admitting: Nurse Practitioner

## 2022-04-24 VITALS — BP 130/82 | HR 66 | Temp 97.6°F | Ht 66.5 in | Wt 192.0 lb

## 2022-04-24 DIAGNOSIS — N8110 Cystocele, unspecified: Secondary | ICD-10-CM | POA: Diagnosis not present

## 2022-04-24 DIAGNOSIS — M1990 Unspecified osteoarthritis, unspecified site: Secondary | ICD-10-CM

## 2022-04-24 DIAGNOSIS — N183 Chronic kidney disease, stage 3 unspecified: Secondary | ICD-10-CM | POA: Diagnosis not present

## 2022-04-24 DIAGNOSIS — N1831 Chronic kidney disease, stage 3a: Secondary | ICD-10-CM

## 2022-04-24 DIAGNOSIS — I10 Essential (primary) hypertension: Secondary | ICD-10-CM | POA: Diagnosis not present

## 2022-04-24 DIAGNOSIS — G2581 Restless legs syndrome: Secondary | ICD-10-CM | POA: Diagnosis not present

## 2022-04-24 DIAGNOSIS — D638 Anemia in other chronic diseases classified elsewhere: Secondary | ICD-10-CM | POA: Diagnosis not present

## 2022-04-24 DIAGNOSIS — E1122 Type 2 diabetes mellitus with diabetic chronic kidney disease: Secondary | ICD-10-CM

## 2022-04-24 DIAGNOSIS — G4700 Insomnia, unspecified: Secondary | ICD-10-CM

## 2022-04-24 DIAGNOSIS — I480 Paroxysmal atrial fibrillation: Secondary | ICD-10-CM

## 2022-04-24 DIAGNOSIS — I509 Heart failure, unspecified: Secondary | ICD-10-CM | POA: Diagnosis not present

## 2022-04-24 DIAGNOSIS — E6609 Other obesity due to excess calories: Secondary | ICD-10-CM

## 2022-04-24 DIAGNOSIS — I110 Hypertensive heart disease with heart failure: Secondary | ICD-10-CM | POA: Diagnosis not present

## 2022-04-24 DIAGNOSIS — Z6830 Body mass index (BMI) 30.0-30.9, adult: Secondary | ICD-10-CM

## 2022-04-24 NOTE — Patient Instructions (Addendum)
Start melatonin 1 mg at bedtime, take routinely

## 2022-04-25 LAB — COMPLETE METABOLIC PANEL WITH GFR
AG Ratio: 1.7 (calc) (ref 1.0–2.5)
ALT: 9 U/L (ref 6–29)
AST: 15 U/L (ref 10–35)
Albumin: 4.2 g/dL (ref 3.6–5.1)
Alkaline phosphatase (APISO): 78 U/L (ref 37–153)
BUN/Creatinine Ratio: 20 (calc) (ref 6–22)
BUN: 31 mg/dL — ABNORMAL HIGH (ref 7–25)
CO2: 24 mmol/L (ref 20–32)
Calcium: 9.3 mg/dL (ref 8.6–10.4)
Chloride: 105 mmol/L (ref 98–110)
Creat: 1.55 mg/dL — ABNORMAL HIGH (ref 0.60–1.00)
Globulin: 2.5 g/dL (calc) (ref 1.9–3.7)
Glucose, Bld: 90 mg/dL (ref 65–99)
Potassium: 4.5 mmol/L (ref 3.5–5.3)
Sodium: 139 mmol/L (ref 135–146)
Total Bilirubin: 0.6 mg/dL (ref 0.2–1.2)
Total Protein: 6.7 g/dL (ref 6.1–8.1)
eGFR: 35 mL/min/{1.73_m2} — ABNORMAL LOW (ref >=60)

## 2022-04-25 LAB — CBC WITH DIFFERENTIAL/PLATELET
Absolute Monocytes: 465 cells/uL (ref 200–950)
Basophils Absolute: 9 cells/uL (ref 0–200)
Basophils Relative: 0.2 %
Eosinophils Absolute: 71 cells/uL (ref 15–500)
Eosinophils Relative: 1.5 %
HCT: 33.1 % — ABNORMAL LOW (ref 35.0–45.0)
Hemoglobin: 11.3 g/dL — ABNORMAL LOW (ref 11.7–15.5)
Lymphs Abs: 1213 cells/uL (ref 850–3900)
MCH: 32.4 pg (ref 27.0–33.0)
MCHC: 34.1 g/dL (ref 32.0–36.0)
MCV: 94.8 fL (ref 80.0–100.0)
MPV: 10.7 fL (ref 7.5–12.5)
Monocytes Relative: 9.9 %
Neutro Abs: 2942 cells/uL (ref 1500–7800)
Neutrophils Relative %: 62.6 %
Platelets: 176 10*3/uL (ref 140–400)
RBC: 3.49 10*6/uL — ABNORMAL LOW (ref 3.80–5.10)
RDW: 13.1 % (ref 11.0–15.0)
Total Lymphocyte: 25.8 %
WBC: 4.7 10*3/uL (ref 3.8–10.8)

## 2022-04-25 LAB — HEMOGLOBIN A1C
Hgb A1c MFr Bld: 6.7 % of total Hgb — ABNORMAL HIGH (ref <5.7)
Mean Plasma Glucose: 146 mg/dL
eAG (mmol/L): 8.1 mmol/L

## 2022-05-01 ENCOUNTER — Telehealth: Payer: Self-pay

## 2022-05-01 NOTE — Telephone Encounter (Signed)
**Note De-Identified  Obfuscation** The pt left her BMSPAF application for Eliquis at the office without documents.  I have completed the providers page of her application and have e-mailed her entire application to Dr Aquilla Hacker nurse so she can obtain his signature, date it, and to fax all to [facility] at the fax number written on the cover letter included.

## 2022-05-05 NOTE — Telephone Encounter (Signed)
Paperwork faxed as requested.  Confirmation fax received.

## 2022-05-12 NOTE — Telephone Encounter (Signed)
**Note De-Identified  Obfuscation** Letter received from [facility] stating that they have denied the [name] assistance. Reason: documentation of 3% out of pocket RX expenses, based on household adjusted income, not met.   The letter states that they have also notified the Makayla of this denial as well.

## 2022-05-29 ENCOUNTER — Encounter: Payer: Self-pay | Admitting: Nurse Practitioner

## 2022-05-29 ENCOUNTER — Ambulatory Visit (INDEPENDENT_AMBULATORY_CARE_PROVIDER_SITE_OTHER): Payer: Medicare HMO | Admitting: Nurse Practitioner

## 2022-05-29 VITALS — BP 144/77 | Wt 193.0 lb

## 2022-05-29 DIAGNOSIS — Z0000 Encounter for general adult medical examination without abnormal findings: Secondary | ICD-10-CM | POA: Diagnosis not present

## 2022-05-29 NOTE — Progress Notes (Signed)
Subjective:   [name] is a 71 y.o. female who presents for Medicare Annual (Subsequent) preventive examination.  Review of Systems           Objective:    There were no vitals filed for this visit. There is no height or weight on file to calculate BMI.     04/24/2022    9:12 AM 10/24/2021    8:03 AM 05/27/2021    8:35 AM 05/07/2021    8:24 AM 11/15/2020    8:30 AM 07/17/2020    2:03 PM 05/27/2020    9:46 AM  Advanced Directives  Does Patient Have a Medical Advance Directive? _0  No No  Would patient like information on creating a medical advance directive? No - Patient declined Yes (MAU/Ambulatory/Procedural Areas - Information given)  Yes (MAU/Ambulatory/Procedural Areas - Information given)  Yes (MAU/Ambulatory/Procedural Areas - Information given) Yes (MAU/Ambulatory/Procedural Areas - Information given)    Current Medications (verified) Outpatient Encounter Medications as of 05/29/2022  Medication Sig   Accu-Chek FastClix Lancets MISC Check blood sugar once daily as directed E11.59   ACCU-CHEK GUIDE test strip CHECK BLOOD SUGAR ONCE     DAILY AS DIRECTED.   amLODipine (NORVASC) 10 MG tablet TAKE 1/2 TABLET EVERY DAY   ascorbic acid (VITAMIN C) 500 MG tablet Take 1 tablet by mouth daily.   Blood Glucose Monitoring Suppl (ACCU-CHEK AVIVA PLUS) w/Device KIT Check blood sugar once daily as directed E11.59  Acc-Chek guide   carvedilol (COREG) 25 MG tablet TAKE 1 TABLET TWICE DAILY WITH MEALS   ELIQUIS 5 MG TABS tablet TAKE 1 TABLET TWICE DAILY   Ferrous Sulfate (IRON) 325 (65 Fe) MG TABS Take by mouth daily.   losartan (COZAAR) 100 MG tablet Take 1 tablet (100 mg total) by mouth daily. for blood pressure   Menthol, Topical Analgesic, (BIOFREEZE) 4 % GEL Apply 3 oz topically 3 (three) times daily as needed. Apply to both knees for pain   metFORMIN (GLUCOPHAGE) 1000 MG tablet TAKE 1 TABLET TWICE DAILY WITH MEALS   rOPINIRole (REQUIP) 0.25 MG tablet TAKE 1  TABLET AT BEDTIME FOR RESTLESS LEGS   rosuvastatin (CRESTOR) 40 MG tablet Take 1 tablet (40 mg total) by mouth daily.   traMADol (ULTRAM) 50 MG tablet TAKE 1 TABLET EVERY 12 HOURS AS NEEDED   triamcinolone ointment (KENALOG) 0.1 % as needed.   Vibegron (GEMTESA) 75 MG TABS Take 1 tablet by mouth daily. (Patient not taking: Reported on 04/24/2022)   No facility-administered encounter medications on file as of 05/29/2022.    Allergies (verified) Hydrocodone and Lisinopril   History: Past Medical History:  Diagnosis Date   Anxiety    Arthritis    osteoarthritis   Breast cancer ([place]) 2008   right, ER/PR -   CHF (congestive heart failure) (HCC)    Depression    Diabetes mellitus    TYPE 2   Gastroparesis    Hx of radiation therapy 04/04/07 to 05/20/07   right breast/6260 cGy   Hypercholesterolemia    Hypertension    Lumbago    Malignant neoplasm of breast (female), unspecified site    Nonspecific elevation of levels of transaminase or lactic acid dehydrogenase (LDH)    Osteoarthrosis, unspecified whether generalized or localized, unspecified site    Regional enteritis of unspecified site    Restless legs syndrome (RLS)    TIA (transient ischemic attack)    Urinary frequency    Uterine prolapse without mention of  vaginal wall prolapse    Vitamin deficiency    Past Surgical History:  Procedure Laterality Date   ABDOMINAL HYSTERECTOMY  1997   TAH/BSO, ENDOMETRIAL SARCOMA   ARTHROPLASTY     left knee   BREAST LUMPECTOMY Right 11/17/06   re-excision 01/13/07   CATARACT EXTRACTION Right 08/2019   CATARACT EXTRACTION Left 07/2019   TOTAL KNEE ARTHROPLASTY Left 06/10/2011       TOTAL KNEE ARTHROPLASTY Right 2006, 2009   Dr Para March   TOTAL KNEE ARTHROPLASTY Left 2008   Dr Para March   WRIST SURGERY     carpel tunnel   Family History  Problem Relation Age of Onset   Diabetes Mother    Diabetes Father    Aneurysm Sister    Arthritis Sister    Diabetes Brother    Heart Problems  Brother    Colon cancer Neg Hx    Social History   Socioeconomic History   Marital status: Widowed    Spouse name: Not on file   Number of children: 4   Years of education: Not on file   Highest education level: Not on file  Occupational History   Occupation: [employer]  Tobacco Use   Smoking status: Former    Packs/day: 0.50    Years: 20.00    Total pack years: 10.00    Types: Cigarettes    Quit date: 09/08/1991    Years since quitting: 30.7   Smokeless tobacco: Never  Vaping Use   Vaping Use: Never used  Substance and Sexual Activity   Alcohol use: No   Drug use: No   Sexual activity: Never  Other Topics Concern   Not on file  Social History Narrative   Widowed   Lives with daughter   Former smoker-stopped 1993   Alcohol none   Exercise 3 days a week goes to [employer]    Social Determinants of Health   Financial Resource Strain: Not on file  Food Insecurity: Not on file  Transportation Needs: Not on file  Physical Activity: Not on file  Stress: Not on file  Social Connections: Not on file    Tobacco Counseling Counseling given: Not Answered   Clinical Intake:                 Diabetic?yes         Activities of Daily Living     No data to display          Patient Care Team: Lauree Chandler, NP as PCP - General (Geriatric Medicine) Deboraha Sprang, MD as PCP - Electrophysiology (Cardiology) Katherine Mantle, [place] (Optometry)  Indicate any recent Medical Services you may have received from other than [facility] providers in the past year (date may be approximate).     Assessment:   This is a routine wellness examination for [name].  Hearing/Vision screen No results found.  Dietary issues and exercise activities discussed:     Goals Addressed   None    Depression Screen    10/24/2021    8:03 AM 05/27/2021    8:32 AM 05/24/2020    8:16 AM 09/25/2019    1:35 PM 05/05/2019    9:19 AM 04/29/2018    9:32 AM 10/15/2017    9:04  AM  PHQ 2/9 Scores  PHQ - 2 Score 0 0 0 0 0 0 0    Fall Risk    04/24/2022    9:12 AM 10/24/2021    8:03 AM 05/27/2021  8:34 AM 05/07/2021    8:24 AM 07/17/2020    2:02 PM  Fall Risk   Falls in the past year? 0 0 0 0 0  Number falls in past yr: 0 0 0 0 0  Injury with Fall? 0 0 0 0 0  Risk for fall due to : No Fall Risks No Fall Risks No Fall Risks No Fall Risks   Follow up Falls evaluation completed Falls evaluation completed Falls evaluation completed Falls evaluation completed     [place]:  Any stairs in or around the home? Yes  If so, are there any without handrails? No  Home free of loose throw rugs in walkways, pet beds, electrical cords, etc? Yes  Adequate lighting in your home to reduce risk of falls? Yes   ASSISTIVE DEVICES UTILIZED TO PREVENT FALLS:  Life alert? No  Use of a cane, walker or w/c? No  Grab bars in the bathroom? Yes  Shower chair or bench in shower? No  Elevated toilet seat or a handicapped toilet? Yes   TIMED UP AND GO:  Was the test performed?  no .    Cognitive Function:    04/29/2018    9:33 AM 04/09/2017   10:31 AM 04/10/2016    2:51 PM 04/05/2015    3:25 PM  MMSE - Mini Mental State Exam  Orientation to time _0 Orientation to Place _1 Registration _2 Attention/ Calculation _3 Recall _4 Language- name 2 objects _5 Language- repeat _6 Language- follow 3 step command _7 Language- read & follow direction _8 Write a sentence 0 _9 Copy design 1 0 0 1  Total score _10 05/27/2021    8:36 AM 05/24/2020    8:21 AM  6CIT Screen  What Year? 0 points 0 points  What month? 0 points 0 points  What time? 0 points 0 points  Count back from 20 0 points 0 points  Months in reverse 4 points 0 points  Repeat phrase 4 points 6 points  Total Score 8 points 6 points    Immunizations Immunization History  Administered Date(s) Administered    Fluad Quad(high Dose 65+) 05/05/2019, 05/15/2021   Influenza, High Dose Seasonal PF 06/04/2017, 05/24/2018, 05/22/2020   Influenza,inj,Quad PF,6+ Mos 07/05/2015   Influenza-Unspecified 07/05/2013, 06/07/2014, 07/08/2016, 05/22/2020   PFIZER(Purple Top)SARS-COV-2 Vaccination 12/22/2019, 01/12/2020, 08/09/2020, 01/05/2021, 01/31/2021   Pneumococcal Conjugate-13 03/07/2014   Pneumococcal Polysaccharide-23 06/08/1999, 11/04/2018   Td 09/08/2003   Tdap 11/29/2013   Zoster Recombinat (Shingrix) 05/07/2019, 09/07/2019   Zoster, Live 08/08/2013    TDAP status: Up to date  Flu Vaccine status: Due, Education has been provided regarding the importance of this vaccine. Advised may receive this vaccine at local pharmacy or Health Dept. Aware to provide a copy of the vaccination record if obtained from local pharmacy or Health Dept. Verbalized acceptance and understanding.  Pneumococcal vaccine status: Up to date  Covid-19 vaccine status: Information provided on how to obtain vaccines.   Qualifies for Shingles Vaccine? Yes   Zostavax completed Yes   Shingrix Completed?: Yes  Screening Tests Health Maintenance  Topic Date Due   Diabetic kidney evaluation - Urine ACR  04/30/2019   COVID-19 Vaccine (  6 - Pfizer risk series) 03/28/2021   FOOT EXAM  11/15/2021   INFLUENZA VACCINE  04/07/2022   HEMOGLOBIN A1C  10/25/2022   OPHTHALMOLOGY EXAM  03/04/2023   Diabetic kidney evaluation - GFR measurement  04/25/2023   TETANUS/TDAP  11/30/2023   MAMMOGRAM  03/30/2024   COLONOSCOPY (Pts 45-69yr Insurance coverage will need to be confirmed)  01/17/2025   Pneumonia Vaccine 73 Years old  Completed   DEXA SCAN  Completed   Hepatitis C Screening  Completed   Zoster Vaccines- Shingrix  Completed   HPV VACCINES  Aged Out    Health Maintenance  Health Maintenance Due  Topic Date Due   Diabetic kidney evaluation - Urine ACR  04/30/2019   COVID-19 Vaccine (6 - Pfizer risk series) 03/28/2021   FOOT  EXAM  11/15/2021   INFLUENZA VACCINE  04/07/2022    Colorectal cancer screening: Type of screening: Colonoscopy. Completed 2016. Repeat every 10 years  Mammogram status: Completed 03/30/22. Repeat every year  Bone Density status: Completed 01/13/22. Results reflect: Bone density results: OSTEOPENIA. Repeat every 2 years.  Lung Cancer Screening: (Low Dose CT Chest recommended if Age 73-80years, 30 pack-year currently smoking OR have quit w/in 15years.) does not qualify.   Lung Cancer Screening Referral: na  Additional Screening:  Hepatitis C Screening: does qualify; Completed 2/23   Vision Screening: Recommended annual ophthalmology exams for early detection of glaucoma and other disorders of the eye. Is the patient up to date with their annual eye exam?  Yes  Who is the provider or what is the name of the office in which the patient attends annual eye exams? [facility] If pt is not established with a provider, would they like to be referred to a provider to establish care? No .   Dental Screening: Recommended annual dental exams for proper oral hygiene  Community Resource Referral / Chronic Care Management: CRR required this visit?  No   CCM required this visit?  No      Plan:     I have personally reviewed and noted the following in the patient's chart:   Medical and social history Use of alcohol, tobacco or illicit drugs  Current medications and supplements including opioid prescriptions. Patient is currently taking opioid prescriptions. Information provided to patient regarding non-opioid alternatives. Patient advised to discuss non-opioid treatment plan with their provider. Functional ability and status Nutritional status Physical activity Advanced directives List of other physicians Hospitalizations, surgeries, and ER visits in previous 12 months Vitals Screenings to include cognitive, depression, and falls Referrals and appointments  In addition, I have  reviewed and discussed with patient certain preventive protocols, quality metrics, and best practice recommendations. A written personalized care plan for preventive services as well as general preventive health recommendations were provided to patient.     JLauree Chandler NP   05/29/2022    Virtual Visit via Telephone Note  I connected with patient 05/29/22 at  9:20 AM EDT by telephone and verified that I am speaking with the correct person using two identifiers.  [place]  I discussed the limitations, risks, security and privacy concerns of performing an evaluation and management service by telephone and the availability of in person appointments. I also discussed with the patient that there may be a patient responsible charge related to this service. The patient expressed understanding and agreed to proceed.   I discussed the assessment and treatment plan with the patient. The patient was provided  an opportunity to ask questions and all were answered. The patient agreed with the plan and demonstrated an understanding of the instructions.   The patient was advised to call back or seek an in-person evaluation if the symptoms worsen or if the condition fails to improve as anticipated.  I provided 16 minutes of non-face-to-face time during this encounter.  [employer]. Harle Battiest Avs printed and mailed

## 2022-05-29 NOTE — Patient Instructions (Signed)
[name] , Thank you for taking time to come for your Medicare Wellness Visit. I appreciate your ongoing commitment to your health goals. Please review the following plan we discussed and let me know if I can assist you in the future.   Screening recommendations/referrals: Colonoscopy up to date Mammogram up to date Bone Density up to date Recommended yearly ophthalmology/optometry visit for glaucoma screening and checkup Recommended yearly dental visit for hygiene and checkup  Vaccinations: Influenza vaccine- due annually in September/October Pneumococcal vaccine up to date Tdap vaccine up to date Shingles vaccine up to date    Advanced directives: recommended to complete  Conditions/risks identified: advance age  Next appointment: yearly next time in person.    Preventive Care 73 Years and Older, Female Preventive care refers to lifestyle choices and visits with your health care provider that can promote health and wellness. What does preventive care include? A yearly physical exam. This is also called an annual well check. Dental exams once or twice a year. Routine eye exams. Ask your health care provider how often you should have your eyes checked. Personal lifestyle choices, including: Daily care of your teeth and gums. Regular physical activity. Eating a healthy diet. Avoiding tobacco and drug use. Limiting alcohol use. Practicing safe sex. Taking low-dose aspirin every day. Taking vitamin and mineral supplements as recommended by your health care provider. What happens during an annual well check? The services and screenings done by your health care provider during your annual well check will depend on your age, overall health, lifestyle risk factors, and family history of disease. Counseling  Your health care provider may ask you questions about your: Alcohol use. Tobacco use. Drug use. Emotional well-being. Home and relationship well-being. Sexual  activity. Eating habits. History of falls. Memory and ability to understand (cognition). Work and work Statistician. Reproductive health. Screening  You may have the following tests or measurements: Height, weight, and BMI. Blood pressure. Lipid and cholesterol levels. These may be checked every 5 years, or more frequently if you are over 73 years old. Skin check. Lung cancer screening. You may have this screening every year starting at age 73 if you have a 30-pack-year history of smoking and currently smoke or have quit within the past 15 years. Fecal occult blood test (FOBT) of the stool. You may have this test every year starting at age 73. Flexible sigmoidoscopy or colonoscopy. You may have a sigmoidoscopy every 5 years or a colonoscopy every 10 years starting at age 14. Hepatitis C blood test. Hepatitis B blood test. Sexually transmitted disease (STD) testing. Diabetes screening. This is done by checking your blood sugar (glucose) after you have not eaten for a while (fasting). You may have this done every 1-3 years. Bone density scan. This is done to screen for osteoporosis. You may have this done starting at age 60. Mammogram. This may be done every 1-2 years. Talk to your health care provider about how often you should have regular mammograms. Talk with your health care provider about your test results, treatment options, and if necessary, the need for more tests. Vaccines  Your health care provider may recommend certain vaccines, such as: Influenza vaccine. This is recommended every year. Tetanus, diphtheria, and acellular pertussis (Tdap, Td) vaccine. You may need a Td booster every 10 years. Zoster vaccine. You may need this after age 35. Pneumococcal 13-valent conjugate (PCV13) vaccine. One dose is recommended after age 84. Pneumococcal polysaccharide (PPSV23) vaccine. One dose is recommended after age 58.  Talk to your health care provider about which screenings and vaccines  you need and how often you need them. This information is not intended to replace advice given to you by your health care provider. Make sure you discuss any questions you have with your health care provider. Document Released: 09/20/2015 Document Revised: 05/13/2016 Document Reviewed: 06/25/2015 Elsevier Interactive Patient Education  2017 [place] Prevention in the Home Falls can cause injuries. They can happen to people of all ages. There are many things you can do to make your home safe and to help prevent falls. What can I do on the outside of my home? Regularly fix the edges of walkways and driveways and fix any cracks. Remove anything that might make you trip as you walk through a door, such as a raised step or threshold. Trim any bushes or trees on the path to your home. Use bright outdoor lighting. Clear any walking paths of anything that might make someone trip, such as rocks or tools. Regularly check to see if handrails are loose or broken. Make sure that both sides of any steps have handrails. Any raised decks and porches should have guardrails on the edges. Have any leaves, snow, or ice cleared regularly. Use sand or salt on walking paths during winter. Clean up any spills in your garage right away. This includes oil or grease spills. What can I do in the bathroom? Use night lights. Install grab bars by the toilet and in the tub and shower. Do not use towel bars as grab bars. Use non-skid mats or decals in the tub or shower. If you need to sit down in the shower, use a plastic, non-slip stool. Keep the floor dry. Clean up any water that spills on the floor as soon as it happens. Remove soap buildup in the tub or shower regularly. Attach bath mats securely with double-sided non-slip rug tape. Do not have throw rugs and other things on the floor that can make you trip. What can I do in the bedroom? Use night lights. Make sure that you have a light by your bed that  is easy to reach. Do not use any sheets or blankets that are too big for your bed. They should not hang down onto the floor. Have a firm chair that has side arms. You can use this for support while you get dressed. Do not have throw rugs and other things on the floor that can make you trip. What can I do in the kitchen? Clean up any spills right away. Avoid walking on wet floors. Keep items that you use a lot in easy-to-reach places. If you need to reach something above you, use a strong step stool that has a grab bar. Keep electrical cords out of the way. Do not use floor polish or wax that makes floors slippery. If you must use wax, use non-skid floor wax. Do not have throw rugs and other things on the floor that can make you trip. What can I do with my stairs? Do not leave any items on the stairs. Make sure that there are handrails on both sides of the stairs and use them. Fix handrails that are broken or loose. Make sure that handrails are as long as the stairways. Check any carpeting to make sure that it is firmly attached to the stairs. Fix any carpet that is loose or worn. Avoid having throw rugs at the top or bottom of the stairs. If you do have throw  rugs, attach them to the floor with carpet tape. Make sure that you have a light switch at the top of the stairs and the bottom of the stairs. If you do not have them, ask someone to add them for you. What else can I do to help prevent falls? Wear shoes that: Do not have high heels. Have rubber bottoms. Are comfortable and fit you well. Are closed at the toe. Do not wear sandals. If you use a stepladder: Make sure that it is fully opened. Do not climb a closed stepladder. Make sure that both sides of the stepladder are locked into place. Ask someone to hold it for you, if possible. Clearly mark and make sure that you can see: Any grab bars or handrails. First and last steps. Where the edge of each step is. Use tools that help you  move around (mobility aids) if they are needed. These include: Canes. Walkers. Scooters. Crutches. Turn on the lights when you go into a dark area. Replace any light bulbs as soon as they burn out. Set up your furniture so you have a clear path. Avoid moving your furniture around. If any of your floors are uneven, fix them. If there are any pets around you, be aware of where they are. Review your medicines with your doctor. Some medicines can make you feel dizzy. This can increase your chance of falling. Ask your doctor what other things that you can do to help prevent falls. This information is not intended to replace advice given to you by your health care provider. Make sure you discuss any questions you have with your health care provider. Document Released: 06/20/2009 Document Revised: 01/30/2016 Document Reviewed: 09/28/2014 Elsevier Interactive Patient Education  2017 [employer].

## 2022-05-29 NOTE — Progress Notes (Addendum)
This service is provided via telemedicine  Vital signs collected/recorded by the medical assistant Shelby.D/CMA. Patient reported.   Location of patient (ex: home, work):  Home.  Patient consents to a telephone visit:  Yes  Location of the provider (ex: office, home):  [employer].  Name of any referring provider:  Lauree Chandler, NP   Names of all persons participating in the telemedicine service and their role in the encounter:  Patient, Heriberto Antigua, RMA, Sherrie Mustache, NP.    Time spent on call: 8 minutes spent on the phone with Medical Assistant.

## 2022-07-09 NOTE — Progress Notes (Signed)
PCP:  Lauree Chandler, NP Primary Cardiologist: None Electrophysiologist: Makayla Axe, MD   [name] is a 71 y.o. female seen today for Makayla Axe, MD for routine electrophysiology followup. Since last being seen in our clinic the patient reports doing well. She is exercising several times a week, with recumbent bike and light weights. She rides the bike for up to an hour, usually goes for 8 miles at speeds up to 7 mph.  she denies chest pain, dyspnea, PND, orthopnea, nausea, vomiting, dizziness, syncope, edema, weight gain, or early satiety.  She still occasionally has a "catch" in her breathing that lasts seconds. No exertional component. Exercises moderately with no symptoms.  Past Medical History:  Diagnosis Date   Anxiety    Arthritis    osteoarthritis   Breast cancer ([place]) 2008   right, ER/PR -   CHF (congestive heart failure) ([place])    Depression    Diabetes mellitus    TYPE 2   Gastroparesis    Hx of radiation therapy 04/04/07 to 05/20/07   right breast/6260 cGy   Hypercholesterolemia    Hypertension    Lumbago    Malignant neoplasm of breast (female), unspecified site    Nonspecific elevation of levels of transaminase or lactic acid dehydrogenase (LDH)    Osteoarthrosis, unspecified whether generalized or localized, unspecified site    Regional enteritis of unspecified site    Restless legs syndrome (RLS)    TIA (transient ischemic attack)    Urinary frequency    Uterine prolapse without mention of vaginal wall prolapse    Vitamin deficiency    Past Surgical History:  Procedure Laterality Date   ABDOMINAL HYSTERECTOMY  1997   TAH/BSO, ENDOMETRIAL SARCOMA   ARTHROPLASTY     left knee   BREAST LUMPECTOMY Right 11/17/06   re-excision 01/13/07   CATARACT EXTRACTION Right 08/2019   CATARACT EXTRACTION Left 07/2019   TOTAL KNEE ARTHROPLASTY Left 06/10/2011       TOTAL KNEE ARTHROPLASTY Right 2006, 2009   Dr Para March   TOTAL KNEE ARTHROPLASTY Left 2008    Dr Para March   WRIST SURGERY     carpel tunnel    Current Outpatient Medications  Medication Sig Dispense Refill   Accu-Chek FastClix Lancets MISC Check blood sugar once daily as directed E11.59 300 each 1   ACCU-CHEK GUIDE test strip CHECK BLOOD SUGAR ONCE     DAILY AS DIRECTED. 100 strip 3   amLODipine (NORVASC) 10 MG tablet TAKE 1/2 TABLET EVERY DAY 45 tablet 3   ascorbic acid (VITAMIN C) 500 MG tablet Take 1 tablet by mouth daily.     Blood Glucose Monitoring Suppl (ACCU-CHEK AVIVA PLUS) w/Device KIT Check blood sugar once daily as directed E11.59  Acc-Chek guide 1 kit 0   carvedilol (COREG) 25 MG tablet TAKE 1 TABLET TWICE DAILY WITH MEALS 180 tablet 3   ELIQUIS 5 MG TABS tablet TAKE 1 TABLET TWICE DAILY 180 tablet 1   Ferrous Sulfate (IRON) 325 (65 Fe) MG TABS Take by mouth daily.     losartan (COZAAR) 100 MG tablet Take 1 tablet (100 mg total) by mouth daily. for blood pressure 90 tablet 3   Menthol, Topical Analgesic, (BIOFREEZE) 4 % GEL Apply 3 oz topically 3 (three) times daily as needed. Apply to both knees for pain 90 g 3   metFORMIN (GLUCOPHAGE) 1000 MG tablet TAKE 1 TABLET TWICE DAILY WITH MEALS 180 tablet 3   rOPINIRole (REQUIP) 0.25 MG tablet TAKE  1 TABLET AT BEDTIME FOR RESTLESS LEGS 90 tablet 3   rosuvastatin (CRESTOR) 40 MG tablet Take 1 tablet (40 mg total) by mouth daily. 90 tablet 3   traMADol (ULTRAM) 50 MG tablet TAKE 1 TABLET EVERY 12 HOURS AS NEEDED 60 tablet 0   triamcinolone ointment (KENALOG) 0.1 % as needed.     Vibegron (GEMTESA) 75 MG TABS Take 1 tablet by mouth daily. 30 tablet 0   No current facility-administered medications for this visit.    Allergies  Allergen Reactions   Codeine Other (See Comments)   Hydrocodone Nausea And Vomiting   Lisinopril     Sick on the stomach    Social History   Socioeconomic History   Marital status: Widowed    Spouse name: Not on file   Number of children: 4   Years of education: Not on file   Highest education  level: Not on file  Occupational History   Occupation: [employer]  Tobacco Use   Smoking status: Former    Packs/day: 0.50    Years: 20.00    Total pack years: 10.00    Types: Cigarettes    Quit date: 09/08/1991    Years since quitting: 30.8   Smokeless tobacco: Never  Vaping Use   Vaping Use: Never used  Substance and Sexual Activity   Alcohol use: No   Drug use: No   Sexual activity: Never  Other Topics Concern   Not on file  Social History Narrative   Widowed   Lives with daughter   Former smoker-stopped 1993   Alcohol none   Exercise 3 days a week goes to [employer]    Social Determinants of Health   Financial Resource Strain: Not on file  Food Insecurity: Not on file  Transportation Needs: Not on file  Physical Activity: Not on file  Stress: Not on file  Social Connections: Not on file  Intimate Partner Violence: Not on file     Review of Systems: All other systems reviewed and are otherwise negative except as noted above.  Physical Exam: Vitals:   07/14/22 0917  BP: (!) 156/88  Pulse: 76  SpO2: 96%  Weight: 204 lb (92.5 kg)  Height: 5' 6.5" (1.689 m)    GEN- The patient is well appearing, alert and oriented x 3 today.   HEENT: normocephalic, atraumatic; sclera clear, conjunctiva pink; hearing intact; oropharynx clear; neck supple, no JVP Lymph- no cervical lymphadenopathy Lungs- Clear to ausculation bilaterally, normal work of breathing.  No wheezes, rales, rhonchi Heart- Regular rate and rhythm, no murmurs, rubs or gallops, PMI not laterally displaced GI- soft, non-tender, non-distended, bowel sounds present, no hepatosplenomegaly Extremities- No peripheral edema. no clubbing or cyanosis; DP/PT/radial pulses 2+ bilaterally MS- no significant deformity or atrophy Skin- warm and dry, no rash or lesion Psych- euthymic mood, full affect Neuro- strength and sensation are intact  EKG is ordered. Personal review of EKG from today shows NSR at 76  bpm  Additional studies reviewed include: Previous EP notes.   ECHO COMPLETE WO [facility]ING [facility]ING [facility]ENT 12/20/2020 1. Left ventricular ejection fraction, by estimation, is 60 to 65%. Left ventricular ejection fraction by 3D volume is 62 %. The left ventricle has normal function. The left ventricle has no regional wall motion abnormalities. Left ventricular diastolic parameters are consistent with Grade I diastolic dysfunction (impaired relaxation). The average left ventricular global longitudinal strain is -19.6 %. The global longitudinal strain is normal. 2. Right ventricular systolic function is normal.  The right ventricular size is normal. There is mildly elevated pulmonary artery systolic pressure. The estimated right ventricular systolic pressure is 96.7 mmHg. 3. Left atrial size was moderately dilated. 4. Right atrial size was mildly dilated. 5. The pericardial effusion is posterior to the left ventricle. 6. The mitral valve is normal in structure. Trivial mitral valve regurgitation. 7. The aortic valve is tricuspid. There is mild calcification of the aortic valve. There is mild thickening of the aortic valve. Aortic valve regurgitation is trivial. Mild aortic valve sclerosis is present, with no evidence of aortic valve stenosis. 8. The inferior vena cava is normal in size with greater than 50% respiratory variability, suggesting right atrial pressure of 3 mmHg.   Assessment and Plan:  1. AFib, paroxysmal EKG today shows NSR at 76 bpm  Continue eliquis for CHA2DS2VASC of at least 4.    2. Chest pain, atypical Echo 12/2020 LVEF 60-65% and no WMA Suspect non-cardiac chest pain, most likely MSK.    3. HTN Stable on current regimen   Follow up with Dr. Caryl Comes in 12 months. She follows with PCP q 6 months. Knows to call with change in symptoms.   Shirley Friar, PA-C  07/14/22 9:26 AM

## 2022-07-14 ENCOUNTER — Encounter: Payer: Self-pay | Admitting: Student

## 2022-07-14 ENCOUNTER — Ambulatory Visit: Payer: Medicare HMO | Attending: Student | Admitting: Student

## 2022-07-14 VITALS — BP 148/88 | HR 76 | Ht 66.5 in | Wt 204.0 lb

## 2022-07-14 DIAGNOSIS — I480 Paroxysmal atrial fibrillation: Secondary | ICD-10-CM | POA: Diagnosis not present

## 2022-07-14 DIAGNOSIS — I10 Essential (primary) hypertension: Secondary | ICD-10-CM

## 2022-07-14 DIAGNOSIS — I429 Cardiomyopathy, unspecified: Secondary | ICD-10-CM

## 2022-07-14 NOTE — Patient Instructions (Signed)
Medication Instructions:  Your physician recommends that you continue on your current medications as directed. Please refer to the Current Medication list given to you today.  *If you need a refill on your cardiac medications before your next appointment, please call your pharmacy*   Lab Work: None If you have labs (blood work) drawn today and your tests are completely normal, you will receive your results only by: [place] (if you have MyChart) OR A paper copy in the mail If you have any lab test that is abnormal or we need to change your treatment, we will call you to review the results.   Follow-Up: At [facility], you and your health needs are our priority.  As part of our continuing mission to provide you with exceptional heart care, we have created designated Provider Care Teams.  These Care Teams include your primary Cardiologist (physician) and Advanced Practice Providers (APPs -  Physician Assistants and Nurse Practitioners) who all work together to provide you with the care you need, when you need it.  Your next appointment:   1 year(s)  The format for your next appointment:   In Person  Provider:   [name], MD     Important Information About Sugar

## 2022-08-02 ENCOUNTER — Other Ambulatory Visit: Payer: Self-pay | Admitting: Internal Medicine

## 2022-08-02 DIAGNOSIS — I480 Paroxysmal atrial fibrillation: Secondary | ICD-10-CM

## 2022-08-03 NOTE — Telephone Encounter (Signed)
Prescription refill request for Eliquis received. Indication:afib Last office visit:11/23 Scr:1.5 Age: 71 Weight:92.5  kg  Prescription refilled

## 2022-10-26 ENCOUNTER — Other Ambulatory Visit: Payer: Self-pay | Admitting: Nurse Practitioner

## 2022-10-26 DIAGNOSIS — E1169 Type 2 diabetes mellitus with other specified complication: Secondary | ICD-10-CM

## 2022-10-30 ENCOUNTER — Ambulatory Visit (INDEPENDENT_AMBULATORY_CARE_PROVIDER_SITE_OTHER): Payer: Medicare HMO | Admitting: Nurse Practitioner

## 2022-10-30 ENCOUNTER — Encounter: Payer: Self-pay | Admitting: Nurse Practitioner

## 2022-10-30 VITALS — BP 150/96 | HR 83 | Temp 95.7°F | Ht 66.5 in | Wt 200.4 lb

## 2022-10-30 DIAGNOSIS — G629 Polyneuropathy, unspecified: Secondary | ICD-10-CM

## 2022-10-30 DIAGNOSIS — G8929 Other chronic pain: Secondary | ICD-10-CM

## 2022-10-30 DIAGNOSIS — E1122 Type 2 diabetes mellitus with diabetic chronic kidney disease: Secondary | ICD-10-CM

## 2022-10-30 DIAGNOSIS — N1831 Chronic kidney disease, stage 3a: Secondary | ICD-10-CM | POA: Diagnosis not present

## 2022-10-30 DIAGNOSIS — R251 Tremor, unspecified: Secondary | ICD-10-CM

## 2022-10-30 DIAGNOSIS — R54 Age-related physical debility: Secondary | ICD-10-CM | POA: Diagnosis not present

## 2022-10-30 DIAGNOSIS — I10 Essential (primary) hypertension: Secondary | ICD-10-CM

## 2022-10-30 DIAGNOSIS — M545 Low back pain, unspecified: Secondary | ICD-10-CM | POA: Diagnosis not present

## 2022-10-30 DIAGNOSIS — R197 Diarrhea, unspecified: Secondary | ICD-10-CM | POA: Diagnosis not present

## 2022-10-30 NOTE — Progress Notes (Signed)
Careteam: Patient Care Team: Lauree Chandler, NP as PCP - General (Geriatric Medicine) Deboraha Sprang, MD as PCP - Electrophysiology (Cardiology) Katherine Mantle, OD (Optometry)  PLACE OF SERVICE:  [place]  Advanced Directive information    Allergies  Allergen Reactions   Codeine Other (See Comments)   Hydrocodone Nausea And Vomiting   Lisinopril     Sick on the stomach    Chief Complaint  Patient presents with   Medical Management of Chronic Issues    Patient presents today for a 6 month follow-up   Quality Metric Gaps    Urine microalbumin,foot exam,A1C,COVID#6     HPI: Patient is a 72 y.o. female here for medical management of chronic issues.  BP elevated today. Runs 140s-150s/70s at home before she takes her medicine. She has not had BP yet this morning and was rushing to drop off a grandbaby at school prior to getting here.  She is trying to cut back on salt but has a hard time giving up starches.  Discussed cutting back on fried and processed foods.   [facility] [place] gets easily fatigued.  She drinks about 2 cups a day. She drinks lots of water. Sleeping well but only 3-4 hours at most. She wakes up at about 2am and then naps from 10am-12pm. 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 She doesn't like melatonin and has tried benadryl in the past as well. Continues iron supplement.   Her glucometer has been broken so she hasn't been able to check it at home.   Denies palpitations, shortness of breath, numbness or tingling. Denies headaches, vision changes. Regularly sees eye doctor. Denies blood in urine or stool. She has some diarrhea, no constipation. Feels like she can't control her bowels properly. Bowel incontinence. Denies dysuria.   Left hand shaky, left foot tingly at bedtime.  Feels off-balance lately.This has all been going on  for a couple weeks. Some dependent edema in lower legs.   Lower back pain that is chronic and managed by tylenol. She was on tramadol but she stopped that bc it is a narcotic.   Review of Systems:  Review of Systems  Constitutional:  Positive for malaise/fatigue. Negative for chills, fever and weight loss.  HENT:  Negative for congestion and sore throat.   Eyes:  Negative for blurred vision.  Respiratory:  Negative for cough, shortness of breath and wheezing.   Cardiovascular:  Negative for chest pain, palpitations and leg swelling.  Gastrointestinal:  Negative for abdominal pain, blood in stool, constipation, diarrhea, heartburn, nausea and vomiting.  Genitourinary:  Negative for dysuria, frequency, hematuria and urgency.  Musculoskeletal:  Positive for back pain. Negative for falls, joint pain and myalgias.  Skin:  Negative for rash.  Neurological:  Positive for tingling and tremors. Negative for dizziness and headaches.  Endo/Heme/Allergies:  Negative for polydipsia.  Psychiatric/Behavioral:  Negative for depression. The patient is not nervous/anxious.     Past Medical History:  Diagnosis Date   Anxiety    Arthritis    osteoarthritis   Breast cancer ([place]) 2008   right, ER/PR -   CHF (congestive heart failure) ([place])    Depression    Diabetes mellitus    TYPE 2   Gastroparesis    Hx of radiation therapy 04/04/07 to 05/20/07   right breast/6260 cGy  Hypercholesterolemia    Hypertension    Lumbago    Malignant neoplasm of breast (female), unspecified site    Nonspecific elevation of levels of transaminase or lactic acid dehydrogenase (LDH)    Osteoarthrosis, unspecified whether generalized or localized, unspecified site    Regional enteritis of unspecified site    Restless legs syndrome (RLS)    TIA (transient ischemic attack)    Urinary frequency    Uterine prolapse without mention of vaginal wall prolapse    Vitamin deficiency    Past Surgical History:  Procedure  Laterality Date   ABDOMINAL HYSTERECTOMY  1997   TAH/BSO, ENDOMETRIAL SARCOMA   ARTHROPLASTY     left knee   BREAST LUMPECTOMY Right 11/17/06   re-excision 01/13/07   CATARACT EXTRACTION Right 08/2019   CATARACT EXTRACTION Left 07/2019   TOTAL KNEE ARTHROPLASTY Left 06/10/2011       TOTAL KNEE ARTHROPLASTY Right 2006, 2009   Dr Para March   TOTAL KNEE ARTHROPLASTY Left 2008   Dr Para March   WRIST SURGERY     carpel tunnel   Social History:   reports that she quit smoking about 31 years ago. Her smoking use included cigarettes. She has a 10.00 pack-year smoking history. She has never used smokeless tobacco. She reports that she does not drink alcohol and does not use drugs.  Family History  Problem Relation Age of Onset   Diabetes Mother    Diabetes Father    Aneurysm Sister    Arthritis Sister    Diabetes Brother    Heart Problems Brother    Colon cancer Neg Hx     Medications: Patient's Medications  New Prescriptions   No medications on file  Previous Medications   ACCU-CHEK FASTCLIX LANCETS MISC    Check blood sugar once daily as directed E11.59   ACCU-CHEK GUIDE TEST STRIP    CHECK BLOOD SUGAR ONCE     DAILY AS DIRECTED.   ACETAMINOPHEN (TYLENOL) 500 MG TABLET    Take 1,000 mg by mouth daily.   AMLODIPINE (NORVASC) 10 MG TABLET    TAKE 1/2 TABLET EVERY DAY   ASCORBIC ACID (VITAMIN C) 500 MG TABLET    Take 1 tablet by mouth daily.   BLOOD GLUCOSE MONITORING SUPPL (ACCU-CHEK AVIVA PLUS) W/DEVICE KIT    Check blood sugar once daily as directed E11.59  Acc-Chek guide   CARVEDILOL (COREG) 25 MG TABLET    TAKE 1 TABLET TWICE DAILY WITH MEALS   ELIQUIS 5 MG TABS TABLET    TAKE 1 TABLET BY MOUTH TWICE A DAY   FERROUS SULFATE (IRON) 325 (65 FE) MG TABS    Take by mouth daily.   LOSARTAN (COZAAR) 100 MG TABLET    Take 1 tablet (100 mg total) by mouth daily. for blood pressure   MENTHOL, TOPICAL ANALGESIC, (BIOFREEZE) 4 % GEL    Apply 3 oz topically 3 (three) times daily as needed. Apply  to both knees for pain   METFORMIN (GLUCOPHAGE) 1000 MG TABLET    TAKE 1 TABLET TWICE DAILY WITH MEALS   ROSUVASTATIN (CRESTOR) 40 MG TABLET    TAKE 1 TABLET EVERY DAY   TRIAMCINOLONE OINTMENT (KENALOG) 0.1 %    as needed.   VIBEGRON (GEMTESA) 75 MG TABS    Take 1 tablet by mouth daily.  Modified Medications   No medications on file  Discontinued Medications   ROPINIROLE (REQUIP) 0.25 MG TABLET    TAKE 1 TABLET AT BEDTIME FOR RESTLESS LEGS  TRAMADOL (ULTRAM) 50 MG TABLET    TAKE 1 TABLET EVERY 12 HOURS AS NEEDED    Physical Exam:  Vitals:   10/30/22 0823 10/30/22 0828  BP: (!) 160/100 (!) 150/96  Pulse: 83   Temp: (!) 95.7 F (35.4 C)   SpO2: 99%   Weight: 200 lb 6.4 oz (90.9 kg)   Height: 5' 6.5" (1.689 m)    Body mass index is 31.86 kg/m. Wt Readings from Last 3 Encounters:  10/30/22 200 lb 6.4 oz (90.9 kg)  07/14/22 204 lb (92.5 kg)  05/29/22 193 lb (87.5 kg)    Physical Exam Vitals reviewed.  Constitutional:      General: She is not in acute distress.    Appearance: Normal appearance.  Cardiovascular:     Rate and Rhythm: Normal rate and regular rhythm.  Pulmonary:     Effort: No respiratory distress.     Breath sounds: Normal breath sounds.  Abdominal:     General: Bowel sounds are normal. There is no distension.     Palpations: Abdomen is soft. There is no mass.     Tenderness: There is no abdominal tenderness. There is no guarding.  Musculoskeletal:     Cervical back: Neck supple.  Lymphadenopathy:     Cervical: No cervical adenopathy.  Skin:    General: Skin is warm and dry.  Neurological:     Mental Status: She is alert and oriented to person, place, and time.  Psychiatric:        Mood and Affect: Mood normal.     Labs reviewed: Basic Metabolic Panel: Recent Labs    04/24/22 0945  NA 139  K 4.5  CL 105  CO2 24  GLUCOSE 90  BUN 31*  CREATININE 1.55*  CALCIUM 9.3   Liver Function Tests: Recent Labs    04/24/22 0945  AST 15  ALT 9   BILITOT 0.6  PROT 6.7   No results for input(s): "LIPASE", "AMYLASE" in the last 8760 hours. No results for input(s): "AMMONIA" in the last 8760 hours. CBC: Recent Labs    04/24/22 0945  WBC 4.7  NEUTROABS 2,942  HGB 11.3*  HCT 33.1*  MCV 94.8  PLT 176   Lipid Panel: No results for input(s): "CHOL", "HDL", "LDLCALC", "TRIG", "CHOLHDL", "LDLDIRECT" in the last 8760 hours. TSH: No results for input(s): "TSH" in the last 8760 hours. A1C: Lab Results  Component Value Date   HGBA1C 6.7 (H) 04/24/2022     Assessment/Plan 1. Type 2 diabetes mellitus with stage 3a chronic kidney disease, without long-term current use of insulin (HCC) Monitor A1C today. Patient does not eat a lot of sweets but she does eat a lot of starches. She is aware she should cut back on starches to avoid worsening diabetes. If A1C is stable consider cutting back on metformin due to diarrhea.  -continue routine foot care and eye exams - Microalbumin / creatinine urine ratio - Hemoglobin A1c - CBC with Differential/Platelet - CMP - Ambulatory referral to Physical Therapy  2. HYPERTENSION, BENIGN SYSTEMIC BP recheck was 160/100. Patient had not taken her medication as yet this morning. Checked her BP machine she brought with her and it showed 140s-150s/70s at home prior to taking medication. Asked patient if she would check BP at home after taking morning medication and report to [place] via mychart in two weeks; she is in agreement.  - CBC with Differential/Platelet - CMP  3. Tremor New left hand tremor that happens occasionally, does not  effect ADLs.  Check electrolytes, TSH, B12. - Vitamin B12 - TSH  4. Neuropathy Monitor diabetes. Imaging of spine, pt has chronic lower back pain. She has felt off-balance, unable to move as fast. Agreeable to trying some PT. - CBC with Differential/Platelet - CMP - Vitamin B12 - TSH - DG Lumbar Spine Complete; Future - Ambulatory referral to Physical Therapy  5.  Diarrhea, unspecified type Has a history of diarrhea but has had some bowel incontinence for the last few weeks. Consider cutting back on metformin pending labs today. Encouraged patient to add benefiber to bulk up stools.   6. Chronic bilateral low back pain without sciatica Patient has new tingling in left foot mainly at night.  - DG Lumbar Spine Complete; Future  7. Age-related physical debility Patient feels slightly weaker, off-balance. Used to being quite active. Amenable to physical therapy.  - Ambulatory referral to Physical Therapy   Return in about 3 months (around 01/28/2023) for routine follow up .  Student- Archer Asa O'Berry ACPCNP-S  I personally was present during the history, physical exam and medical decision-making activities of this service and have verified that the service and findings are accurately documented in the student's note Alexandria Shiflett K. [place], [place] [facility] [phone]

## 2022-10-30 NOTE — Patient Instructions (Addendum)
Increase water intake earlier in the day Take fiber supplement daily- benefiber- add into 8 oz of fluid daily  Increase protein in diet- eats 3 meals a daily    To check blood pressure 1 hour AFTER you have had your medication Make sure you have been sitting at least 5 mins  Record and let us know.  Goal <140/90

## 2022-10-31 LAB — COMPREHENSIVE METABOLIC PANEL
AG Ratio: 1.7 (calc) (ref 1.0–2.5)
ALT: 11 U/L (ref 6–29)
AST: 16 U/L (ref 10–35)
Albumin: 4.8 g/dL (ref 3.6–5.1)
Alkaline phosphatase (APISO): 72 U/L (ref 37–153)
BUN/Creatinine Ratio: 17 (calc) (ref 6–22)
BUN: 24 mg/dL (ref 7–25)
CO2: 26 mmol/L (ref 20–32)
Calcium: 9.9 mg/dL (ref 8.6–10.4)
Chloride: 106 mmol/L (ref 98–110)
Creat: 1.42 mg/dL — ABNORMAL HIGH (ref 0.60–1.00)
Globulin: 2.9 g/dL (calc) (ref 1.9–3.7)
Glucose, Bld: 130 mg/dL — ABNORMAL HIGH (ref 65–99)
Potassium: 3.5 mmol/L (ref 3.5–5.3)
Sodium: 142 mmol/L (ref 135–146)
Total Bilirubin: 0.4 mg/dL (ref 0.2–1.2)
Total Protein: 7.7 g/dL (ref 6.1–8.1)

## 2022-10-31 LAB — CBC WITH DIFFERENTIAL/PLATELET
Absolute Monocytes: 343 cells/uL (ref 200–950)
Basophils Absolute: 22 cells/uL (ref 0–200)
Basophils Relative: 0.5 %
Eosinophils Absolute: 79 cells/uL (ref 15–500)
Eosinophils Relative: 1.8 %
HCT: 35.2 % (ref 35.0–45.0)
Hemoglobin: 11.7 g/dL (ref 11.7–15.5)
Lymphs Abs: 1294 cells/uL (ref 850–3900)
MCH: 30.5 pg (ref 27.0–33.0)
MCHC: 33.2 g/dL (ref 32.0–36.0)
MCV: 91.7 fL (ref 80.0–100.0)
MPV: 11.3 fL (ref 7.5–12.5)
Monocytes Relative: 7.8 %
Neutro Abs: 2662 cells/uL (ref 1500–7800)
Neutrophils Relative %: 60.5 %
Platelets: 209 10*3/uL (ref 140–400)
RBC: 3.84 10*6/uL (ref 3.80–5.10)
RDW: 14.6 % (ref 11.0–15.0)
Total Lymphocyte: 29.4 %
WBC: 4.4 10*3/uL (ref 3.8–10.8)

## 2022-10-31 LAB — HEMOGLOBIN A1C
Hgb A1c MFr Bld: 7.4 % of total Hgb — ABNORMAL HIGH (ref <5.7)
Mean Plasma Glucose: 166 mg/dL
eAG (mmol/L): 9.2 mmol/L

## 2022-10-31 LAB — MICROALBUMIN / CREATININE URINE RATIO
Creatinine, Urine: 13 mg/dL — ABNORMAL LOW (ref 20–275)
Microalb Creat Ratio: 377 mcg/mg creat — ABNORMAL HIGH (ref <30)
Microalb, Ur: 4.9 mg/dL

## 2022-10-31 LAB — VITAMIN B12: Vitamin B-12: 1195 pg/mL — ABNORMAL HIGH (ref 200–1100)

## 2022-10-31 LAB — TSH: TSH: 0.83 mIU/L (ref 0.40–4.50)

## 2022-11-03 ENCOUNTER — Ambulatory Visit: Payer: Medicare HMO | Attending: Nurse Practitioner | Admitting: Physical Therapy

## 2022-11-03 ENCOUNTER — Other Ambulatory Visit: Payer: Self-pay | Admitting: Nurse Practitioner

## 2022-11-03 ENCOUNTER — Encounter: Payer: Self-pay | Admitting: Physical Therapy

## 2022-11-03 ENCOUNTER — Other Ambulatory Visit: Payer: Self-pay

## 2022-11-03 VITALS — BP 180/75 | HR 70

## 2022-11-03 DIAGNOSIS — E1122 Type 2 diabetes mellitus with diabetic chronic kidney disease: Secondary | ICD-10-CM | POA: Diagnosis not present

## 2022-11-03 DIAGNOSIS — M6281 Muscle weakness (generalized): Secondary | ICD-10-CM

## 2022-11-03 DIAGNOSIS — N1831 Chronic kidney disease, stage 3a: Secondary | ICD-10-CM | POA: Diagnosis not present

## 2022-11-03 DIAGNOSIS — R2689 Other abnormalities of gait and mobility: Secondary | ICD-10-CM

## 2022-11-03 DIAGNOSIS — R54 Age-related physical debility: Secondary | ICD-10-CM | POA: Diagnosis not present

## 2022-11-03 DIAGNOSIS — G629 Polyneuropathy, unspecified: Secondary | ICD-10-CM | POA: Insufficient documentation

## 2022-11-03 DIAGNOSIS — R2681 Unsteadiness on feet: Secondary | ICD-10-CM | POA: Diagnosis not present

## 2022-11-03 MED ORDER — EMPAGLIFLOZIN 10 MG PO TABS: 10.0000 mg | ORAL_TABLET | Freq: Every day | ORAL | 1 refills | Status: DC

## 2022-11-03 NOTE — Therapy (Signed)
OUTPATIENT PHYSICAL THERAPY NEURO EVALUATION   Patient Name: [name] MRN: [id number] DOB:May 29, 1951, 72 y.o., female Today's Date: 11/03/2022   PCP: Lauree Chandler, NP REFERRING PROVIDER: Lauree Chandler, NP  END OF SESSION:  PT End of Session - 11/03/22 1159     Visit Number 1    Number of Visits 9   8+eval   Date for PT Re-Evaluation 01/01/23    Authorization Type [employer] MEDICARE    PT Start Time 1150   pt late   PT Stop Time 1232    PT Time Calculation (min) 42 min    Equipment Utilized During Treatment Gait belt    Activity Tolerance Patient tolerated treatment well    Behavior During Therapy WFL for tasks assessed/performed             Past Medical History:  Diagnosis Date   Anxiety    Arthritis    osteoarthritis   Breast cancer ([place]) 2008   right, ER/PR -   CHF (congestive heart failure) ([place])    Depression    Diabetes mellitus    TYPE 2   Gastroparesis    Hx of radiation therapy 04/04/07 to 05/20/07   right breast/6260 cGy   Hypercholesterolemia    Hypertension    Lumbago    Malignant neoplasm of breast (female), unspecified site    Nonspecific elevation of levels of transaminase or lactic acid dehydrogenase (LDH)    Osteoarthrosis, unspecified whether generalized or localized, unspecified site    Regional enteritis of unspecified site    Restless legs syndrome (RLS)    TIA (transient ischemic attack)    Urinary frequency    Uterine prolapse without mention of vaginal wall prolapse    Vitamin deficiency    Past Surgical History:  Procedure Laterality Date   ABDOMINAL HYSTERECTOMY  1997   TAH/BSO, ENDOMETRIAL SARCOMA   ARTHROPLASTY     left knee   BREAST LUMPECTOMY Right 11/17/06   re-excision 01/13/07   CATARACT EXTRACTION Right 08/2019   CATARACT EXTRACTION Left 07/2019   TOTAL KNEE ARTHROPLASTY Left 06/10/2011       TOTAL KNEE ARTHROPLASTY Right 2006, 2009   Dr Para March   TOTAL KNEE ARTHROPLASTY Left 2008   Dr Para March   WRIST  SURGERY     carpel tunnel   Patient Active Problem List   Diagnosis Date Noted   History of sarcoma 05/27/2020   Pneumonia due to COVID-19 virus 09/15/2019   AF (paroxysmal atrial fibrillation) ([place]) 09/15/2019   Chronic anticoagulation 09/15/2019   Anemia, chronic disease 10/15/2017   Type 2 diabetes mellitus without complication, without long-term current use of insulin ([place]) 10/15/2017   Hyperlipidemia associated with type 2 diabetes mellitus ([place]) 10/15/2017   Claustrophobia 07/05/2015   Bilateral edema of lower extremity 09/11/2014   CKD stage 3 due to type 2 diabetes mellitus ([place]) 03/07/2014   Restless leg syndrome 11/29/2013   Other and unspecified hyperlipidemia 11/29/2013   Osteoarthritis 11/29/2013   CKD (chronic kidney disease) 08/16/2013   Insomnia 08/16/2013   Atrial flutter- not confirmed on ECG review 12/15/2012   Loss of weight 12/15/2012   Gastroparesis 12/14/2012   Renal insufficiency 12/14/2012   History of malignant neoplasm of right breast 01/01/2012   Arthritis    Hx of radiation therapy    OSTEOARTHRITIS, HANDS, BILATERAL 05/29/2009   CHEST PAIN, ATYPICAL 02/09/2008   LOW BACK PAIN, ACUTE 01/03/2007   Diabetes mellitus due to underlying condition with diabetic nephropathy ([place]) 11/04/2006  HYPERCHOLESTEROLEMIA 11/04/2006   HYPERTENSION, BENIGN SYSTEMIC 11/04/2006   Cardiomyopathy-recovered 11/04/2006   MENOPAUSAL SYNDROME 11/04/2006   OSTEOARTHRITIS, LOWER LEG 11/04/2006   GAS/BLOATING 11/04/2006    ONSET DATE: 10/12/2022 (starting feeling off balance and weak)  REFERRING DIAG: E11.22,N18.31 (ICD-10-CM) - Type 2 diabetes mellitus with stage 3a chronic kidney disease, without long-term current use of insulin (HCC) G62.9 (ICD-10-CM) - Neuropathy R54 (ICD-10-CM) - Age-related physical debility  THERAPY DIAG:  Muscle weakness (generalized) - Plan: PT plan of care cert/re-cert  Unsteadiness on feet - Plan: PT plan of care cert/re-cert  Other  abnormalities of gait and mobility - Plan: PT plan of care cert/re-cert  Rationale for Evaluation and Treatment: Rehabilitation  SUBJECTIVE:                                                                                                                                                                                             SUBJECTIVE STATEMENT: Roughly 2-3 weeks ago patient states she started feeling more off balance and weak all over.  She denies any known mechanism contributing to change in status.   Pt accompanied by: self  PERTINENT HISTORY: DM2 w/ neuropathy, hypercholestremia, HTN, OA, CKD, PAF w/ chronic anticoagulation therapy  PAIN:  Are you having pain? No  PRECAUTIONS: Fall and Other: chronic anticoagulation therapy for PAF; osteopenia  WEIGHT BEARING RESTRICTIONS: No  FALLS: Has patient fallen in last 6 months? No  LIVING ENVIRONMENT: Lives with: lives with their daughter Lives in: House/apartment Stairs: No Has following equipment at home: Grab bars and elevated toilet seat  PLOF: Independent  PATIENT GOALS: "I don't know if I can get this left leg to feel stronger."  She elaborates that she does not like to walk out in the open.  She would like to return to the [facility] to continue her walking 1 mile a day and using weights to workout.  OBJECTIVE:   DIAGNOSTIC FINDINGS: No recent relevant imaging; bone density 01/13/2022 indicates osteopenia  COGNITION: Overall cognitive status: Within functional limits for tasks assessed   SENSATION: Light touch: WFL  COORDINATION: BLE RAMS:  WFL BLE Heel-to-shin:  WFL  EDEMA:  None noted bilaterally  MUSCLE TONE: None noted in BLE  POSTURE: forward head  LOWER EXTREMITY ROM:     Active  Right Eval Left Eval  Hip flexion [facility]  Hip extension    Hip abduction    Hip adduction    Hip internal rotation    Hip external rotation    Knee flexion    Knee extension " "  Ankle dorsiflexion " "  Ankle  plantarflexion  Ankle inversion    Ankle eversion     (Blank rows = not tested)  LOWER EXTREMITY MMT:    MMT Right Eval Left Eval  Hip flexion 4-/5 3+/5  Hip extension    Hip abduction    Hip adduction    Hip internal rotation    Hip external rotation    Knee flexion    Knee extension 4/5 3+/5  Ankle dorsiflexion 5/5 3+/5  Ankle plantarflexion    Ankle inversion    Ankle eversion    (Blank rows = not tested)  BED MOBILITY:  Pt reports independence  TRANSFERS: Assistive device utilized: None  Sit to stand: Complete Independence Stand to sit: Complete Independence Chair to chair: Complete Independence  GAIT: Gait pattern: step through pattern, decreased arm swing- Right, decreased arm swing- Left, decreased stride length, shuffling, and narrow BOS Distance walked: various clinic distances Assistive device utilized: None Level of assistance: SBA Comments: Left adduction causing left insole to consistently swipe right foot during swing phase.  No scanning deficits noted.  FUNCTIONAL TESTS:  30 seconds chair stand test: 7 STS w/ BUE support 10 meter walk test: 14.15 seconds no AD = 0.71 m/sec OR 2.33 ft/sec Functional gait assessment: 12/30  [facility] PT Assessment - 11/03/22 1231       Functional Gait  Assessment   Gait assessed  Yes    Gait Level Surface Walks 20 ft in less than 7 sec but greater than 5.5 sec, uses assistive device, slower speed, mild gait deviations, or deviates 6-10 in outside of the 12 in walkway width.    Change in Gait Speed Makes only minor adjustments to walking speed, or accomplishes a change in speed with significant gait deviations, deviates 10-15 in outside the 12 in walkway width, or changes speed but loses balance but is able to recover and continue walking.    Gait with Horizontal Head Turns Performs head turns with moderate changes in gait velocity, slows down, deviates 10-15 in outside 12 in walkway width but recovers, can continue to  walk.    Gait with Vertical Head Turns Performs task with moderate change in gait velocity, slows down, deviates 10-15 in outside 12 in walkway width but recovers, can continue to walk.    Gait and Pivot Turn Pivot turns safely in greater than 3 sec and stops with no loss of balance, or pivot turns safely within 3 sec and stops with mild imbalance, requires small steps to catch balance.    Step Over Obstacle Is able to step over one shoe box (4.5 in total height) but must slow down and adjust steps to clear box safely. May require verbal cueing.    Gait with Narrow Base of Support Ambulates less than 4 steps heel to toe or cannot perform without assistance.    Gait with Eyes Closed Walks 20 ft, slow speed, abnormal gait pattern, evidence for imbalance, deviates 10-15 in outside 12 in walkway width. Requires more than 9 sec to ambulate 20 ft.    Ambulating Backwards Walks 20 ft, slow speed, abnormal gait pattern, evidence for imbalance, deviates 10-15 in outside 12 in walkway width.    Steps Alternating feet, must use rail.    Total Score 12    FGA comment: 12/30 = high fall risk              PATIENT SURVEYS:  ABC scale To be assessed.  TODAY'S TREATMENT:  DATE: N/A    PATIENT EDUCATION: Education details: PT POC, assessment outcomes, and goals to be set. Person educated: Patient Education method: Explanation Education comprehension: verbalized understanding  HOME EXERCISE PROGRAM: To be established.  GOALS: Goals reviewed with patient? Yes  SHORT TERM GOALS: Target date: 12/04/2022  Pt will be independent with strength and balance HEP to promote functional LE strength and improved balance. Baseline:  To be established. Goal status: INITIAL  2.  Pt will perform >/=10 sit to stands in 30 seconds w/o UE support to demonstrate decreased risk for falls and  improved functional LE strength. Baseline: 7 STS w/ BUE support Goal status: INITIAL  3.  Pt will demonstrate a gait speed of >/=2.73 feet/sec w/o AD in order to decrease risk for falls. Baseline: 2.33 ft/sec no AD Goal status: INITIAL  4.  Pt will improve FGA score to >/=16/30 in order to demonstrate improved balance and decreased fall risk. Baseline: 12/30 Goal status: INITIAL  LONG TERM GOALS: Target date: 01/01/2023  Pt will report return to [facility] walking and regular exercise at least 1 day per week in order to progress toward baseline activity tolerance. Baseline:  Has stopped for now. Goal status: INITIAL  2.  Pt will perform >/=13 sit to stands in 30 seconds w/o UE support to demonstrate decreased risk for falls and improved functional LE strength. Baseline: 7 STS w/ BUE support Goal status: INITIAL  3.  Pt will demonstrate a gait speed of >/=3.1 feet/sec w/o AD in order to decrease risk for falls. Baseline: 2.33 ft/sec no AD Goal status: INITIAL  4.  Pt will improve FGA score to >/=20/30 in order to demonstrate improved balance and decreased fall risk. Baseline: 12/30 Goal status: INITIAL  5.  ABC scale to be assessed w/ LTG set as appropriate. Baseline: To be assessed. Goal status: INITIAL  ASSESSMENT:  CLINICAL IMPRESSION: Patient is a 72 y.o. female who was seen today for physical therapy evaluation and treatment for progressed imbalance and weakness affecting the LLE primarily over the last 3 weeks.  Pt has a significant PMH of DM2 w/ neuropathy, hypercholestremia, HTN, OA, CKD, and PAF on chronic anticoagulation therapy.  At this time, there is no known history of CVA.  Identified impairments include significant LLE weakness compared to right and LLE adduction in swing phase as well as narrowed ambulatory BOS.  Evaluation via the following assessment tools: 30 second chair stand, 10MWT, and FGA indicate high fall risk.  Will assess patient's perception of her balance  using the ABC scale next session due to voiced concerns for crowds and other tasks during evaluation today.  She would benefit from skilled PT to address impairments as noted and progress towards long term goals and is in agreement to current frequency due to desire to return to her prior level of extracurricular activity as balance and confidence improve.   OBJECTIVE IMPAIRMENTS: Abnormal gait, decreased activity tolerance, decreased balance, decreased strength, and postural dysfunction.   ACTIVITY LIMITATIONS: bending, squatting, stairs, and locomotion level  PARTICIPATION LIMITATIONS: community activity  PERSONAL FACTORS: Age, Fitness, Profession, and 3+ comorbidities: HTN, DM2 w/ neuropathy, PAF on chronic anticoagulation, OA, and hypercholestremia  are also affecting patient's functional outcome.   REHAB POTENTIAL: Good  CLINICAL DECISION MAKING: Evolving/moderate complexity  EVALUATION COMPLEXITY: Moderate  PLAN:  PT FREQUENCY: 1x/week  PT DURATION: 8 weeks  PLANNED INTERVENTIONS: Therapeutic exercises, Therapeutic activity, Neuromuscular re-education, Balance training, Gait training, Patient/Family education, Self Care, Stair training, Vestibular training, DME instructions, and  Re-evaluation  PLAN FOR NEXT SESSION: ABC scale-set LTG, initial strength and balance HEP   Bary Richard, PT, DPT 11/03/2022, 1:55 PM

## 2022-11-05 ENCOUNTER — Ambulatory Visit
Admission: RE | Admit: 2022-11-05 | Discharge: 2022-11-05 | Disposition: A | Discharge status: Home / Self Care | Payer: Medicare HMO | Source: Ambulatory Visit | Attending: Nurse Practitioner | Admitting: Nurse Practitioner

## 2022-11-05 DIAGNOSIS — M545 Low back pain, unspecified: Secondary | ICD-10-CM

## 2022-11-05 DIAGNOSIS — G629 Polyneuropathy, unspecified: Secondary | ICD-10-CM

## 2022-11-11 ENCOUNTER — Encounter: Payer: Self-pay | Admitting: Physical Therapy

## 2022-11-11 ENCOUNTER — Ambulatory Visit: Payer: Medicare HMO | Attending: Nurse Practitioner | Admitting: Physical Therapy

## 2022-11-11 DIAGNOSIS — R2681 Unsteadiness on feet: Secondary | ICD-10-CM | POA: Insufficient documentation

## 2022-11-11 DIAGNOSIS — M6281 Muscle weakness (generalized): Secondary | ICD-10-CM | POA: Diagnosis not present

## 2022-11-11 DIAGNOSIS — R2689 Other abnormalities of gait and mobility: Secondary | ICD-10-CM | POA: Insufficient documentation

## 2022-11-11 NOTE — Patient Instructions (Addendum)
You Can Walk For A Certain Length Of Time Each Day                          Walk 10 minutes 1 times per day (4-5 days a week).             Increase 2  minutes every 7-14 days              Work up to 20 minutes (1-2 times per day).               Example:                         Day 1-2           4-5 minutes     3 times per day                         Day 7-8           10-12 minutes 2-3 times per day                         Day 13-14       20-22 minutes 1-2 times per day  Access Code: [email or web address] URL: [email or web address] Date: 11/11/2022 Prepared by: Elease Etienne  Exercises - Sit to Stand with Arms Crossed  - 1 x daily - 5 x weekly - 2-3 sets - 6-8 reps - Tandem Walking with Counter Support  - 1 x daily - 5 x weekly - 3 sets - 10 reps - Walking with Eyes Closed and Counter Support  - 1 x daily - 5 x weekly - 3 sets - 10 reps - Backward Walking with Counter Support  - 1 x daily - 5 x weekly - 3 sets - 10 reps

## 2022-11-11 NOTE — Therapy (Signed)
OUTPATIENT PHYSICAL THERAPY NEURO TREATMENT   Patient Name: [name] MRN: [id number] DOB:[birth year], 72 y.o., female Today's Date: 11/11/2022   PCP: Lauree Chandler, NP REFERRING PROVIDER: Lauree Chandler, NP  END OF SESSION:  PT End of Session - 11/11/22 0933     Visit Number 2    Number of Visits 9   8+eval   Date for PT Re-Evaluation 01/01/23    Authorization Type [employer] MEDICARE    Authorization Time Period 11/03/22 - 01/01/23    PT Start Time 0931    PT Stop Time 1010    PT Time Calculation (min) 39 min    Equipment Utilized During Treatment Gait belt    Activity Tolerance Patient tolerated treatment well    Behavior During Therapy WFL for tasks assessed/performed             Past Medical History:  Diagnosis Date   Anxiety    Arthritis    osteoarthritis   Breast cancer ([place]) 2008   right, ER/PR -   CHF (congestive heart failure) ([place])    Depression    Diabetes mellitus    TYPE 2   Gastroparesis    Hx of radiation therapy 04/04/07 to 05/20/07   right breast/6260 cGy   Hypercholesterolemia    Hypertension    Lumbago    Malignant neoplasm of breast (female), unspecified site    Nonspecific elevation of levels of transaminase or lactic acid dehydrogenase (LDH)    Osteoarthrosis, unspecified whether generalized or localized, unspecified site    Regional enteritis of unspecified site    Restless legs syndrome (RLS)    TIA (transient ischemic attack)    Urinary frequency    Uterine prolapse without mention of vaginal wall prolapse    Vitamin deficiency    Past Surgical History:  Procedure Laterality Date   ABDOMINAL HYSTERECTOMY  1997   TAH/BSO, ENDOMETRIAL SARCOMA   ARTHROPLASTY     left knee   BREAST LUMPECTOMY Right 11/17/06   re-excision 01/13/07   CATARACT EXTRACTION Right 08/2019   CATARACT EXTRACTION Left 07/2019   TOTAL KNEE ARTHROPLASTY Left 06/10/2011       TOTAL KNEE ARTHROPLASTY Right 2006, 2009   Dr Para March   TOTAL KNEE  ARTHROPLASTY Left 2008   Dr Para March   WRIST SURGERY     carpel tunnel   Patient Active Problem List   Diagnosis Date Noted   History of sarcoma 05/27/2020   Pneumonia due to COVID-19 virus 09/15/2019   AF (paroxysmal atrial fibrillation) ([place]) 09/15/2019   Chronic anticoagulation 09/15/2019   Anemia, chronic disease 10/15/2017   Type 2 diabetes mellitus without complication, without long-term current use of insulin ([place]) 10/15/2017   Hyperlipidemia associated with type 2 diabetes mellitus ([place]) 10/15/2017   Claustrophobia 07/05/2015   Bilateral edema of lower extremity 09/11/2014   CKD stage 3 due to type 2 diabetes mellitus ([place]) 03/07/2014   Restless leg syndrome 11/29/2013   Other and unspecified hyperlipidemia 11/29/2013   Osteoarthritis 11/29/2013   CKD (chronic kidney disease) 08/16/2013   Insomnia 08/16/2013   Atrial flutter- not confirmed on ECG review 12/15/2012   Loss of weight 12/15/2012   Gastroparesis 12/14/2012   Renal insufficiency 12/14/2012   History of malignant neoplasm of right breast 01/01/2012   Arthritis    Hx of radiation therapy    OSTEOARTHRITIS, HANDS, BILATERAL 05/29/2009   CHEST PAIN, ATYPICAL 02/09/2008   LOW BACK PAIN, ACUTE 01/03/2007   Diabetes mellitus due to underlying condition with  diabetic nephropathy ([place]) 11/04/2006   HYPERCHOLESTEROLEMIA 11/04/2006   HYPERTENSION, BENIGN SYSTEMIC 11/04/2006   Cardiomyopathy-recovered 11/04/2006   MENOPAUSAL SYNDROME 11/04/2006   OSTEOARTHRITIS, LOWER LEG 11/04/2006   GAS/BLOATING 11/04/2006    ONSET DATE: 10/12/2022 (starting feeling off balance and weak)  REFERRING DIAG: E11.22,N18.31 (ICD-10-CM) - Type 2 diabetes mellitus with stage 3a chronic kidney disease, without long-term current use of insulin (HCC) G62.9 (ICD-10-CM) - Neuropathy R54 (ICD-10-CM) - Age-related physical debility  THERAPY DIAG:  Muscle weakness (generalized)  Unsteadiness on feet  Other abnormalities of gait and  mobility  Rationale for Evaluation and Treatment: Rehabilitation  SUBJECTIVE:                                                                                                                                                                                             SUBJECTIVE STATEMENT: Pt states she was having trouble with the LLE not wanting to move 2 days ago, but worked out yesterday and it feels better today.  She denies falls or acute issues.   Pt accompanied by: self  PERTINENT HISTORY: DM2 w/ neuropathy, hypercholestremia, HTN, OA, CKD, PAF w/ chronic anticoagulation therapy  PAIN:  Are you having pain? No  PRECAUTIONS: Fall and Other: chronic anticoagulation therapy for PAF; osteopenia  OBJECTIVE:   DIAGNOSTIC FINDINGS: No recent relevant imaging; bone density 01/13/2022 indicates osteopenia  COGNITION: Overall cognitive status: Within functional limits for tasks assessed   SENSATION: Light touch: WFL  COORDINATION: BLE RAMS:  WFL BLE Heel-to-shin:  WFL  EDEMA:  None noted bilaterally  MUSCLE TONE: None noted in BLE  POSTURE: forward head  LOWER EXTREMITY ROM:     Active  Right Eval Left Eval  Hip flexion [facility]  Hip extension    Hip abduction    Hip adduction    Hip internal rotation    Hip external rotation    Knee flexion    Knee extension " "  Ankle dorsiflexion " "  Ankle plantarflexion    Ankle inversion    Ankle eversion     (Blank rows = not tested)  LOWER EXTREMITY MMT:    MMT Right Eval Left Eval  Hip flexion 4-/5 3+/5  Hip extension    Hip abduction    Hip adduction    Hip internal rotation    Hip external rotation    Knee flexion    Knee extension 4/5 3+/5  Ankle dorsiflexion 5/5 3+/5  Ankle plantarflexion    Ankle inversion    Ankle eversion    (Blank rows = not tested)  BED MOBILITY:  Pt reports independence  TRANSFERS: Assistive  device utilized: None  Sit to stand: Complete Independence Stand to sit: Complete  Independence Chair to chair: Complete Independence  GAIT: Gait pattern: step through pattern, decreased arm swing- Right, decreased arm swing- Left, decreased stride length, shuffling, and narrow BOS Distance walked: various clinic distances Assistive device utilized: None Level of assistance: SBA Comments: Left adduction causing left insole to consistently swipe right foot during swing phase.  No scanning deficits noted.  FUNCTIONAL TESTS:  30 seconds chair stand test: 7 STS w/ BUE support 10 meter walk test: 14.15 seconds no AD = 0.71 m/sec OR 2.33 ft/sec Functional gait assessment: 12/30  PATIENT SURVEYS:  ABC scale To be assessed.  TODAY'S TREATMENT:                                                                                                                               -Discussed walking program: You Can Walk For A Certain Length Of Time Each Day                          Walk 10 minutes 1 times per day (4-5 days a week).             Increase 2  minutes every 7-14 days              Work up to 20 minutes (1-2 times per day).               Example:                         Day 1-2           4-5 minutes     3 times per day                         Day 7-8           10-12 minutes 2-3 times per day                         Day 13-14       20-22 minutes 1-2 times per day  -ABC Scale:  31.875%  -STS x8 w/ hands-on-knees > STS arms crossed on chest x8 -Forward tandem walking 4x10' at countertop, progression to fingertip support -Backwards walking 4x10' at countertop, focus on increased step size -Forward walking w/ eyes closed at countertop 4x10'  PATIENT EDUCATION: Education details: Walking program, initial HEP, and ABC scale explanation and interpretation. Person educated: Patient Education method: Explanation Education comprehension: verbalized understanding  HOME EXERCISE PROGRAM: You Can Walk For A Certain Length Of Time Each Day                          Walk 10  minutes 1 times per day (4-5 days a week).  Increase 2  minutes every 7-14 days              Work up to 20 minutes (1-2 times per day).               Example:                         Day 1-2           4-5 minutes     3 times per day                         Day 7-8           10-12 minutes 2-3 times per day                         Day 13-14       20-22 minutes 1-2 times per day  Access Code: [email or web address] URL: [email or web address] Date: 11/11/2022 Prepared by: Elease Etienne  Exercises - Sit to Stand with Arms Crossed  - 1 x daily - 5 x weekly - 2-3 sets - 6-8 reps - Tandem Walking with Counter Support  - 1 x daily - 5 x weekly - 3 sets - 10 reps - Walking with Eyes Closed and Counter Support  - 1 x daily - 5 x weekly - 3 sets - 10 reps - Backward Walking with Counter Support  - 1 x daily - 5 x weekly - 3 sets - 10 reps  GOALS: Goals reviewed with patient? Yes  SHORT TERM GOALS: Target date: 12/04/2022  Pt will be independent with strength and balance HEP to promote functional LE strength and improved balance. Baseline:  To be established. Goal status: INITIAL  2.  Pt will perform >/=10 sit to stands in 30 seconds w/o UE support to demonstrate decreased risk for falls and improved functional LE strength. Baseline: 7 STS w/ BUE support Goal status: INITIAL  3.  Pt will demonstrate a gait speed of >/=2.73 feet/sec w/o AD in order to decrease risk for falls. Baseline: 2.33 ft/sec no AD Goal status: INITIAL  4.  Pt will improve FGA score to >/=16/30 in order to demonstrate improved balance and decreased fall risk. Baseline: 12/30 Goal status: INITIAL  LONG TERM GOALS: Target date: 01/01/2023  Pt will report return to [facility] walking and regular exercise at least 1 day per week in order to progress toward baseline activity tolerance. Baseline:  Has stopped for now. Goal status: INITIAL  2.  Pt will perform >/=13 sit to stands in 30 seconds w/o UE support to  demonstrate decreased risk for falls and improved functional LE strength. Baseline: 7 STS w/ BUE support Goal status: INITIAL  3.  Pt will demonstrate a gait speed of >/=3.1 feet/sec w/o AD in order to decrease risk for falls. Baseline: 2.33 ft/sec no AD Goal status: INITIAL  4.  Pt will improve FGA score to >/=20/30 in order to demonstrate improved balance and decreased fall risk. Baseline: 12/30 Goal status: INITIAL  5.  Pt will improve ABC scale score to >/=50% in order to demonstrate decreased fear of falling. Baseline: 31.875% Goal status: INITIAL  ASSESSMENT:  CLINICAL IMPRESSION: Assessed ABC scale with patient indicating a low confidence percentage of 31.875% in dynamic balance tasks.  She exhibits fear of falling following dynamic counter tasks this session.  Established initial HEP to address dynamic impairments noted on  evaluation.  Pt continues to benefit from ongoing skilled PT POC to promote functional strength and progression to high level dynamic stability to maintain safe independence.  OBJECTIVE IMPAIRMENTS: Abnormal gait, decreased activity tolerance, decreased balance, decreased strength, and postural dysfunction.   ACTIVITY LIMITATIONS: bending, squatting, stairs, and locomotion level  PARTICIPATION LIMITATIONS: community activity  PERSONAL FACTORS: Age, Fitness, Profession, and 3+ comorbidities: HTN, DM2 w/ neuropathy, PAF on chronic anticoagulation, OA, and hypercholestremia  are also affecting patient's functional outcome.   REHAB POTENTIAL: Good  CLINICAL DECISION MAKING: Evolving/moderate complexity  EVALUATION COMPLEXITY: Moderate  PLAN:  PT FREQUENCY: 1x/week  PT DURATION: 8 weeks  PLANNED INTERVENTIONS: Therapeutic exercises, Therapeutic activity, Neuromuscular re-education, Balance training, Gait training, Patient/Family education, Self Care, Stair training, Vestibular training, DME instructions, and Re-evaluation  PLAN FOR NEXT SESSION: Modify  strength and balance HEP prn, walking-head turns/nods, airex, tilt board   Bary Richard, PT, DPT 11/11/2022, 10:14 AM

## 2022-11-16 ENCOUNTER — Ambulatory Visit: Payer: Medicare HMO | Admitting: Physical Therapy

## 2022-11-16 ENCOUNTER — Encounter: Payer: Self-pay | Admitting: Physical Therapy

## 2022-11-16 DIAGNOSIS — M6281 Muscle weakness (generalized): Secondary | ICD-10-CM | POA: Diagnosis not present

## 2022-11-16 DIAGNOSIS — R2681 Unsteadiness on feet: Secondary | ICD-10-CM | POA: Diagnosis not present

## 2022-11-16 DIAGNOSIS — R2689 Other abnormalities of gait and mobility: Secondary | ICD-10-CM

## 2022-11-16 NOTE — Therapy (Signed)
OUTPATIENT PHYSICAL THERAPY NEURO TREATMENT   Patient Name: [name] MRN: [id number] DOB:Nov 02, 1950, 72 y.o., female Today's Date: 11/16/2022   PCP: Lauree Chandler, NP REFERRING PROVIDER: Lauree Chandler, NP  END OF SESSION:  PT End of Session - 11/16/22 0849     Visit Number 3    Number of Visits 9   8+eval   Date for PT Re-Evaluation 01/01/23    Authorization Type [employer] MEDICARE    Authorization Time Period 11/03/22 - 01/01/23    PT Start Time 0846    PT Stop Time 0928    PT Time Calculation (min) 42 min    Equipment Utilized During Treatment Gait belt    Activity Tolerance Patient tolerated treatment well    Behavior During Therapy WFL for tasks assessed/performed             Past Medical History:  Diagnosis Date   Anxiety    Arthritis    osteoarthritis   Breast cancer ([place]) 2008   right, ER/PR -   CHF (congestive heart failure) ([place])    Depression    Diabetes mellitus    TYPE 2   Gastroparesis    Hx of radiation therapy 04/04/07 to 05/20/07   right breast/6260 cGy   Hypercholesterolemia    Hypertension    Lumbago    Malignant neoplasm of breast (female), unspecified site    Nonspecific elevation of levels of transaminase or lactic acid dehydrogenase (LDH)    Osteoarthrosis, unspecified whether generalized or localized, unspecified site    Regional enteritis of unspecified site    Restless legs syndrome (RLS)    TIA (transient ischemic attack)    Urinary frequency    Uterine prolapse without mention of vaginal wall prolapse    Vitamin deficiency    Past Surgical History:  Procedure Laterality Date   ABDOMINAL HYSTERECTOMY  1997   TAH/BSO, ENDOMETRIAL SARCOMA   ARTHROPLASTY     left knee   BREAST LUMPECTOMY Right 11/17/06   re-excision 01/13/07   CATARACT EXTRACTION Right 08/2019   CATARACT EXTRACTION Left 07/2019   TOTAL KNEE ARTHROPLASTY Left 06/10/2011       TOTAL KNEE ARTHROPLASTY Right 2006, 2009   Dr Para March   TOTAL KNEE  ARTHROPLASTY Left 2008   Dr Para March   WRIST SURGERY     carpel tunnel   Patient Active Problem List   Diagnosis Date Noted   History of sarcoma 05/27/2020   Pneumonia due to COVID-19 virus 09/15/2019   AF (paroxysmal atrial fibrillation) ([place]) 09/15/2019   Chronic anticoagulation 09/15/2019   Anemia, chronic disease 10/15/2017   Type 2 diabetes mellitus without complication, without long-term current use of insulin ([place]) 10/15/2017   Hyperlipidemia associated with type 2 diabetes mellitus ([place]) 10/15/2017   Claustrophobia 07/05/2015   Bilateral edema of lower extremity 09/11/2014   CKD stage 3 due to type 2 diabetes mellitus ([place]) 03/07/2014   Restless leg syndrome 11/29/2013   Other and unspecified hyperlipidemia 11/29/2013   Osteoarthritis 11/29/2013   CKD (chronic kidney disease) 08/16/2013   Insomnia 08/16/2013   Atrial flutter- not confirmed on ECG review 12/15/2012   Loss of weight 12/15/2012   Gastroparesis 12/14/2012   Renal insufficiency 12/14/2012   History of malignant neoplasm of right breast 01/01/2012   Arthritis    Hx of radiation therapy    OSTEOARTHRITIS, HANDS, BILATERAL 05/29/2009   CHEST PAIN, ATYPICAL 02/09/2008   LOW BACK PAIN, ACUTE 01/03/2007   Diabetes mellitus due to underlying condition with  diabetic nephropathy ([place]) 11/04/2006   HYPERCHOLESTEROLEMIA 11/04/2006   HYPERTENSION, BENIGN SYSTEMIC 11/04/2006   Cardiomyopathy-recovered 11/04/2006   MENOPAUSAL SYNDROME 11/04/2006   OSTEOARTHRITIS, LOWER LEG 11/04/2006   GAS/BLOATING 11/04/2006    ONSET DATE: 10/12/2022 (starting feeling off balance and weak)  REFERRING DIAG: E11.22,N18.31 (ICD-10-CM) - Type 2 diabetes mellitus with stage 3a chronic kidney disease, without long-term current use of insulin (HCC) G62.9 (ICD-10-CM) - Neuropathy R54 (ICD-10-CM) - Age-related physical debility  THERAPY DIAG:  Muscle weakness (generalized)  Unsteadiness on feet  Other abnormalities of gait and  mobility  Rationale for Evaluation and Treatment: Rehabilitation  SUBJECTIVE:                                                                                                                                                                                             SUBJECTIVE STATEMENT: Pt denies falls or acute changes.  She states she has been practicing standing w/o support and not furniture surfing when walking without the cane.  She mowed her yard by push mower yesterday w/ her son's help w/o issue.  Her walking program and HEP are going well. Pt accompanied by: self  PERTINENT HISTORY: DM2 w/ neuropathy, hypercholestremia, HTN, OA, CKD, PAF w/ chronic anticoagulation therapy  PAIN:  Are you having pain? No  PRECAUTIONS: Fall and Other: chronic anticoagulation therapy for PAF; osteopenia  OBJECTIVE:   DIAGNOSTIC FINDINGS: No recent relevant imaging; bone density 01/13/2022 indicates osteopenia  COGNITION: Overall cognitive status: Within functional limits for tasks assessed   SENSATION: Light touch: WFL  COORDINATION: BLE RAMS:  WFL BLE Heel-to-shin:  WFL  EDEMA:  None noted bilaterally  MUSCLE TONE: None noted in BLE  POSTURE: forward head  LOWER EXTREMITY ROM:     Active  Right Eval Left Eval  Hip flexion [facility]  Hip extension    Hip abduction    Hip adduction    Hip internal rotation    Hip external rotation    Knee flexion    Knee extension " "  Ankle dorsiflexion " "  Ankle plantarflexion    Ankle inversion    Ankle eversion     (Blank rows = not tested)  LOWER EXTREMITY MMT:    MMT Right Eval Left Eval  Hip flexion 4-/5 3+/5  Hip extension    Hip abduction    Hip adduction    Hip internal rotation    Hip external rotation    Knee flexion    Knee extension 4/5 3+/5  Ankle dorsiflexion 5/5 3+/5  Ankle plantarflexion    Ankle inversion    Ankle eversion    (  Blank rows = not tested)  BED MOBILITY:  Pt reports  independence  TRANSFERS: Assistive device utilized: None  Sit to stand: Complete Independence Stand to sit: Complete Independence Chair to chair: Complete Independence  GAIT: Gait pattern: step through pattern, decreased arm swing- Right, decreased arm swing- Left, decreased stride length, shuffling, and narrow BOS Distance walked: various clinic distances Assistive device utilized: None Level of assistance: SBA Comments: Left adduction causing left insole to consistently swipe right foot during swing phase.  No scanning deficits noted.  FUNCTIONAL TESTS:  30 seconds chair stand test: 7 STS w/ BUE support 10 meter walk test: 14.15 seconds no AD = 0.71 m/sec OR 2.33 ft/sec Functional gait assessment: 12/30  PATIENT SURVEYS:  ABC scale To be assessed.  TODAY'S TREATMENT:                                                                                                                              -Forward and backward walking w/ head rotation and nods, 3x20' each direction SBA -Standing on airex in // bars:  1 minute holding level no UE support, min sway noted, SBA > head turns x1 minute no UE support > head nods x1 minute no UE support > Eyes closed normal BOS x1 minute SBA, moderate sway noted > alternating 8" cone taps progressing to no UE support, SBA -Laterally oriented tilt board holding level x1 minute no UE support, no sway noted > left and right lateral weight shifts in progressed ROM no UE support -Anteriorly oriented tilt board holding level x1 minute no UE support, mild sway noted > anterior to posterior weight shifts no UE support close SBA > alternating 8" midline cone taps progressing to no UE support SBA-CGA, used marching in place to assist in progress to no UE support, pt demonstrates stiff UE posture throughout balance tasks despite mirror feedback and cuing -SciFit x8 minutes level 3.0 progressed to 4.5 using BUE/BLE for cardiovascular endurance and reciprocal mobility  especially UE engagement, RPE 9/10-pt endorses this is how hard she prefers to work, no acute signs of distress so intensity maintained.  PATIENT EDUCATION: Education details: Continue HEP, progress walking program and go to gym when people are present to practice balance in busier environment using techniques like stopping to rest, ambulating close to the wall for touch assist if needed, and stepping strategies to navigate passersby. Person educated: Patient Education method: Explanation Education comprehension: verbalized understanding  HOME EXERCISE PROGRAM: You Can Walk For A Certain Length Of Time Each Day                          Walk 10 minutes 1 times per day (4-5 days a week).             Increase 2  minutes every 7-14 days              Work up to 20 minutes (1-2  times per day).               Example:                         Day 1-2           4-5 minutes     3 times per day                         Day 7-8           10-12 minutes 2-3 times per day                         Day 13-14       20-22 minutes 1-2 times per day  Access Code: [email or web address] URL: [email or web address] Date: 11/11/2022 Prepared by: Elease Etienne  Exercises - Sit to Stand with Arms Crossed  - 1 x daily - 5 x weekly - 2-3 sets - 6-8 reps - Tandem Walking with Counter Support  - 1 x daily - 5 x weekly - 3 sets - 10 reps - Walking with Eyes Closed and Counter Support  - 1 x daily - 5 x weekly - 3 sets - 10 reps - Backward Walking with Counter Support  - 1 x daily - 5 x weekly - 3 sets - 10 reps  GOALS: Goals reviewed with patient? Yes  SHORT TERM GOALS: Target date: 12/04/2022  Pt will be independent with strength and balance HEP to promote functional LE strength and improved balance. Baseline:  To be established. Goal status: INITIAL  2.  Pt will perform >/=10 sit to stands in 30 seconds w/o UE support to demonstrate decreased risk for falls and improved functional LE strength. Baseline: 7  STS w/ BUE support Goal status: INITIAL  3.  Pt will demonstrate a gait speed of >/=2.73 feet/sec w/o AD in order to decrease risk for falls. Baseline: 2.33 ft/sec no AD Goal status: INITIAL  4.  Pt will improve FGA score to >/=16/30 in order to demonstrate improved balance and decreased fall risk. Baseline: 12/30 Goal status: INITIAL  LONG TERM GOALS: Target date: 01/01/2023  Pt will report return to [facility] walking and regular exercise at least 1 day per week in order to progress toward baseline activity tolerance. Baseline:  Has stopped for now. Goal status: INITIAL  2.  Pt will perform >/=13 sit to stands in 30 seconds w/o UE support to demonstrate decreased risk for falls and improved functional LE strength. Baseline: 7 STS w/ BUE support Goal status: INITIAL  3.  Pt will demonstrate a gait speed of >/=3.1 feet/sec w/o AD in order to decrease risk for falls. Baseline: 2.33 ft/sec no AD Goal status: INITIAL  4.  Pt will improve FGA score to >/=20/30 in order to demonstrate improved balance and decreased fall risk. Baseline: 12/30 Goal status: INITIAL  5.  Pt will improve ABC scale score to >/=50% in order to demonstrate decreased fear of falling. Baseline: 31.875% Goal status: INITIAL  ASSESSMENT:  CLINICAL IMPRESSION: Patient tolerates all interventions well this session with focus on static balance and dynamic head movement.  She maintains rigid UE posture throughout, but demonstrates good ankle strategy with challenges.  She is making good progress towards goals and continues to benefit from skilled PT to promote dynamic stability and decreased fear of falling.  OBJECTIVE IMPAIRMENTS: Abnormal gait, decreased activity tolerance, decreased balance,  decreased strength, and postural dysfunction.   ACTIVITY LIMITATIONS: bending, squatting, stairs, and locomotion level  PARTICIPATION LIMITATIONS: community activity  PERSONAL FACTORS: Age, Fitness, Profession, and 3+  comorbidities: HTN, DM2 w/ neuropathy, PAF on chronic anticoagulation, OA, and hypercholestremia  are also affecting patient's functional outcome.   REHAB POTENTIAL: Good  CLINICAL DECISION MAKING: Evolving/moderate complexity  EVALUATION COMPLEXITY: Moderate  PLAN:  PT FREQUENCY: 1x/week  PT DURATION: 8 weeks  PLANNED INTERVENTIONS: Therapeutic exercises, Therapeutic activity, Neuromuscular re-education, Balance training, Gait training, Patient/Family education, Self Care, Stair training, Vestibular training, DME instructions, and Re-evaluation  PLAN FOR NEXT SESSION: Modify strength and balance HEP prn, dynamic balance-foam beam, monster walks, hurdles   Bary Richard, PT, DPT 11/16/2022, 9:33 AM

## 2022-11-25 ENCOUNTER — Ambulatory Visit: Payer: Medicare HMO

## 2022-11-25 ENCOUNTER — Other Ambulatory Visit: Payer: Self-pay | Admitting: Nurse Practitioner

## 2022-11-25 DIAGNOSIS — R2681 Unsteadiness on feet: Secondary | ICD-10-CM | POA: Diagnosis not present

## 2022-11-25 DIAGNOSIS — R2689 Other abnormalities of gait and mobility: Secondary | ICD-10-CM

## 2022-11-25 DIAGNOSIS — M6281 Muscle weakness (generalized): Secondary | ICD-10-CM | POA: Diagnosis not present

## 2022-11-25 NOTE — Therapy (Signed)
OUTPATIENT PHYSICAL THERAPY NEURO TREATMENT   Patient Name: [name] MRN: [id number] DOB:[birth year], 72 y.o., female Today's Date: 11/25/2022   PCP: Lauree Chandler, NP REFERRING PROVIDER: Lauree Chandler, NP  END OF SESSION:  PT End of Session - 11/25/22 0920     Visit Number 4    Number of Visits 9   8+eval   Date for PT Re-Evaluation 01/01/23    Authorization Type [employer]IC[employer]    Authorization Time Period 11/03/22 - 01/01/23    PT Start Time 0925    PT Stop Time 1010    PT Time Calculation (min) 45 min    Equipment Utilized During Treatment Gait belt    Activity Tolerance Patient tolerated treatment well    Behavior During Therapy WFL for tasks assessed/performed             Past Medical History:  Diagnosis Date   Anxiety    Arthritis    osteoarthritis   Breast cancer ([place]) 2008   right, ER/PR -   CHF (congestive heart failure) ([place])    Depression    Diabetes mellitus    TYPE 2   Gastroparesis    Hx of radiation therapy 04/04/07 to 05/20/07   right breast/6260 cGy   Hypercholesterolemia    Hypertension    Lumbago    Malignant neoplasm of breast (female), unspecified site    Nonspecific elevation of levels of transaminase or lactic acid dehydrogenase (LDH)    Osteoarthrosis, unspecified whether generalized or localized, unspecified site    Regional enteritis of unspecified site    Restless legs syndrome (RLS)    TIA (transient ischemic attack)    Urinary frequency    Uterine prolapse without mention of vaginal wall prolapse    Vitamin deficiency    Past Surgical History:  Procedure Laterality Date   ABDOMINAL HYSTERECTOMY  1997   TAH/BSO, ENDOMETRIAL SARCOMA   ARTHROPLASTY     left knee   BREAST LUMPECTOMY Right 11/17/06   re-excision 01/13/07   CATARACT EXTRACTION Right 08/2019   CATARACT EXTRACTION Left 07/2019   TOTAL KNEE ARTHROPLASTY Left 06/10/2011       TOTAL KNEE ARTHROPLASTY Right 2006, 2009   Dr Para March   TOTAL KNEE  ARTHROPLASTY Left 2008   Dr Para March   WRIST SURGERY     carpel tunnel   Patient Active Problem List   Diagnosis Date Noted   History of sarcoma 05/27/2020   Pneumonia due to COVID-19 virus 09/15/2019   AF (paroxysmal atrial fibrillation) ([place]) 09/15/2019   Chronic anticoagulation 09/15/2019   Anemia, chronic disease 10/15/2017   Type 2 diabetes mellitus without complication, without long-term current use of insulin ([place]) 10/15/2017   Hyperlipidemia associated with type 2 diabetes mellitus ([place]) 10/15/2017   Claustrophobia 07/05/2015   Bilateral edema of lower extremity 09/11/2014   CKD stage 3 due to type 2 diabetes mellitus ([place]) 03/07/2014   Restless leg syndrome 11/29/2013   Other and unspecified hyperlipidemia 11/29/2013   Osteoarthritis 11/29/2013   CKD (chronic kidney disease) 08/16/2013   Insomnia 08/16/2013   Atrial flutter- not confirmed on ECG review 12/15/2012   Loss of weight 12/15/2012   Gastroparesis 12/14/2012   Renal insufficiency 12/14/2012   History of malignant neoplasm of right breast 01/01/2012   Arthritis    Hx of radiation therapy    OSTEOARTHRITIS, HANDS, BILATERAL 05/29/2009   CHEST PAIN, ATYPICAL 02/09/2008   LOW BACK PAIN, ACUTE 01/03/2007   Diabetes mellitus due to underlying condition with  diabetic nephropathy ([place]) 11/04/2006   HYPERCHOLESTEROLEMIA 11/04/2006   HYPERTENSION, BENIGN SYSTEMIC 11/04/2006   Cardiomyopathy-recovered 11/04/2006   MENOPAUSAL SYNDROME 11/04/2006   OSTEOARTHRITIS, LOWER LEG 11/04/2006   GAS/BLOATING 11/04/2006    ONSET DATE: 10/12/2022 (starting feeling off balance and weak)  REFERRING DIAG: E11.22,N18.31 (ICD-10-CM) - Type 2 diabetes mellitus with stage 3a chronic kidney disease, without long-term current use of insulin (HCC) G62.9 (ICD-10-CM) - Neuropathy R54 (ICD-10-CM) - Age-related physical debility  THERAPY DIAG:  Muscle weakness (generalized)  Unsteadiness on feet  Other abnormalities of gait and  mobility  Rationale for Evaluation and Treatment: Rehabilitation  SUBJECTIVE:                                                                                                                                                                                             SUBJECTIVE STATEMENT: I am doing better. No falls. I am walking 45 min daily. Going to [facility] to walk and lift weights. Being mindful of what I do and how I do it like suggested from therapy with walking and transfers. Pt accompanied by: self  PERTINENT HISTORY: DM2 w/ neuropathy, hypercholestremia, HTN, OA, CKD, PAF w/ chronic anticoagulation therapy  PAIN:  Are you having pain? No  PRECAUTIONS: Fall and Other: chronic anticoagulation therapy for PAF; osteopenia  OBJECTIVE:   DIAGNOSTIC FINDINGS: No recent relevant imaging; bone density 01/13/2022 indicates osteopenia  COGNITION: Overall cognitive status: Within functional limits for tasks assessed   SENSATION: Light touch: WFL  COORDINATION: BLE RAMS:  WFL BLE Heel-to-shin:  WFL  EDEMA:  None noted bilaterally  MUSCLE TONE: None noted in BLE  POSTURE: forward head  LOWER EXTREMITY ROM:     Active  Right Eval Left Eval  Hip flexion [facility]  Hip extension    Hip abduction    Hip adduction    Hip internal rotation    Hip external rotation    Knee flexion    Knee extension " "  Ankle dorsiflexion " "  Ankle plantarflexion    Ankle inversion    Ankle eversion     (Blank rows = not tested)  LOWER EXTREMITY MMT:    MMT Right Eval Left Eval  Hip flexion 4-/5 3+/5  Hip extension    Hip abduction    Hip adduction    Hip internal rotation    Hip external rotation    Knee flexion    Knee extension 4/5 3+/5  Ankle dorsiflexion 5/5 3+/5  Ankle plantarflexion    Ankle inversion    Ankle eversion    (Blank rows = not tested)  BED MOBILITY:  Pt  reports independence  TRANSFERS: Assistive device utilized: None  Sit to stand: Complete  Independence Stand to sit: Complete Independence Chair to chair: Complete Independence  GAIT: Gait pattern: step through pattern, decreased arm swing- Right, decreased arm swing- Left, decreased stride length, shuffling, and narrow BOS Distance walked: various clinic distances Assistive device utilized: None Level of assistance: SBA Comments: Left adduction causing left insole to consistently swipe right foot during swing phase.  No scanning deficits noted.  FUNCTIONAL TESTS:  30 seconds chair stand test: 7 STS w/ BUE support 10 meter walk test: 14.15 seconds no AD = 0.71 m/sec OR 2.33 ft/sec Functional gait assessment: 12/30  PATIENT SURVEYS:  ABC scale To be assessed.  TODAY'S TREATMENT:                                                                                                                              Standing on tilt board: ML tilt: with horizontal head turns: 10x R and L with head and body turns Standing on tilt board: AP tilt: with vertical head turns: 10x cues for hip sway forward with looking up and hip sway back with looking down Foam balance: EC: 1 x 1' wide BOS; EC: 1 x 1' with narrow BOS Standing on foam: cone taps: 20x R and L with intermittent HHA Gait training: total 345' with fast/normal cadence, intermittent fwd and bwd direction changes, 180 deg turns with quick walking.  Hurdles: 4x 6" and 4 x 8" placed alternating on floor: step to pattern: 8x R, 8x L going fwd. 8x going to R and going to L 8x Alternating steps on 1st step at stairs on floor: 10x, 2nd step: 10x R and L, no HHA Sit to stand: 2 x 5x no HHA Scifit: constant work: 40 watts: 10' UE and LE  PATIENT EDUCATION: Education details: Continue HEP, progress walking program and go to gym when people are present to practice balance in busier environment using techniques like stopping to rest, ambulating close to the wall for touch assist if needed, and stepping strategies to navigate passersby. Person  educated: Patient Education method: Explanation Education comprehension: verbalized understanding  HOME EXERCISE PROGRAM: You Can Walk For A Certain Length Of Time Each Day                          Walk 10 minutes 1 times per day (4-5 days a week).             Increase 2  minutes every 7-14 days              Work up to 20 minutes (1-2 times per day).               Example:                         Day 1-2  4-5 minutes     3 times per day                         Day 7-8           10-12 minutes 2-3 times per day                         Day 13-14       20-22 minutes 1-2 times per day  Access Code: [email or web address] URL: [email or web address] Date: 11/11/2022 Prepared by: Elease Etienne  Exercises - Sit to Stand with Arms Crossed  - 1 x daily - 5 x weekly - 2-3 sets - 6-8 reps - Tandem Walking with Counter Support  - 1 x daily - 5 x weekly - 3 sets - 10 reps - Walking with Eyes Closed and Counter Support  - 1 x daily - 5 x weekly - 3 sets - 10 reps - Backward Walking with Counter Support  - 1 x daily - 5 x weekly - 3 sets - 10 reps  GOALS: Goals reviewed with patient? Yes  SHORT TERM GOALS: Target date: 12/04/2022  Pt will be independent with strength and balance HEP to promote functional LE strength and improved balance. Baseline:  To be established. Goal status: INITIAL  2.  Pt will perform >/=10 sit to stands in 30 seconds w/o UE support to demonstrate decreased risk for falls and improved functional LE strength. Baseline: 7 STS w/ BUE support Goal status: INITIAL  3.  Pt will demonstrate a gait speed of >/=2.73 feet/sec w/o AD in order to decrease risk for falls. Baseline: 2.33 ft/sec no AD Goal status: INITIAL  4.  Pt will improve FGA score to >/=16/30 in order to demonstrate improved balance and decreased fall risk. Baseline: 12/30 Goal status: INITIAL  LONG TERM GOALS: Target date: 01/01/2023  Pt will report return to [facility] walking and regular  exercise at least 1 day per week in order to progress toward baseline activity tolerance. Baseline:  Has stopped for now. Goal status: INITIAL  2.  Pt will perform >/=13 sit to stands in 30 seconds w/o UE support to demonstrate decreased risk for falls and improved functional LE strength. Baseline: 7 STS w/ BUE support Goal status: INITIAL  3.  Pt will demonstrate a gait speed of >/=3.1 feet/sec w/o AD in order to decrease risk for falls. Baseline: 2.33 ft/sec no AD Goal status: INITIAL  4.  Pt will improve FGA score to >/=20/30 in order to demonstrate improved balance and decreased fall risk. Baseline: 12/30 Goal status: INITIAL  5.  Pt will improve ABC scale score to >/=50% in order to demonstrate decreased fear of falling. Baseline: 31.875% Goal status: INITIAL  ASSESSMENT:  CLINICAL IMPRESSION: Today' session focused on working on static and dynamic balance. Pt is demonstrating improving confidence with daily walking and transfers. Pt is demo compliance with HEP and is motivated to get better.  OBJECTIVE IMPAIRMENTS: Abnormal gait, decreased activity tolerance, decreased balance, decreased strength, and postural dysfunction.   ACTIVITY LIMITATIONS: bending, squatting, stairs, and locomotion level  PARTICIPATION LIMITATIONS: community activity  PERSONAL FACTORS: Age, Fitness, Profession, and 3+ comorbidities: HTN, DM2 w/ neuropathy, PAF on chronic anticoagulation, OA, and hypercholestremia  are also affecting patient's functional outcome.   REHAB POTENTIAL: Good  CLINICAL DECISION MAKING: Evolving/moderate complexity  EVALUATION COMPLEXITY: Moderate  PLAN:  PT FREQUENCY: 1x/week  PT DURATION: 8 weeks  PLANNED INTERVENTIONS:  Therapeutic exercises, Therapeutic activity, Neuromuscular re-education, Balance training, Gait training, Patient/Family education, Self Care, Stair training, Vestibular training, DME instructions, and Re-evaluation  PLAN FOR NEXT SESSION: Check  STG, Modify strength and balance HEP prn, dynamic balance-foam beam, monster walks, hurdles   Kerrie Pleasure, PT, DPT 11/25/2022, 9:20 AM

## 2022-11-30 ENCOUNTER — Ambulatory Visit: Payer: Medicare HMO | Admitting: Physical Therapy

## 2022-11-30 ENCOUNTER — Encounter: Payer: Self-pay | Admitting: Physical Therapy

## 2022-11-30 DIAGNOSIS — R2689 Other abnormalities of gait and mobility: Secondary | ICD-10-CM | POA: Diagnosis not present

## 2022-11-30 DIAGNOSIS — R2681 Unsteadiness on feet: Secondary | ICD-10-CM | POA: Diagnosis not present

## 2022-11-30 DIAGNOSIS — M6281 Muscle weakness (generalized): Secondary | ICD-10-CM

## 2022-11-30 NOTE — Therapy (Signed)
OUTPATIENT PHYSICAL THERAPY NEURO TREATMENT   Patient Name: [name] MRN: [id number] DOB:Jun 10, 1951, 72 y.o., female Today's Date: 11/30/2022   PCP: Lauree Chandler, NP REFERRING PROVIDER: Lauree Chandler, NP  END OF SESSION:  PT End of Session - 11/30/22 0851     Visit Number 5    Number of Visits 9   8+eval   Date for PT Re-Evaluation 01/01/23    Authorization Type [employer]IC[employer]    Authorization Time Period 11/03/22 - 01/01/23    PT Start Time 0848    PT Stop Time 0928    PT Time Calculation (min) 40 min    Equipment Utilized During Treatment Gait belt    Activity Tolerance Patient tolerated treatment well    Behavior During Therapy WFL for tasks assessed/performed             Past Medical History:  Diagnosis Date   Anxiety    Arthritis    osteoarthritis   Breast cancer ([place]) 2008   right, ER/PR -   CHF (congestive heart failure) ([place])    Depression    Diabetes mellitus    TYPE 2   Gastroparesis    Hx of radiation therapy 04/04/07 to 05/20/07   right breast/6260 cGy   Hypercholesterolemia    Hypertension    Lumbago    Malignant neoplasm of breast (female), unspecified site    Nonspecific elevation of levels of transaminase or lactic acid dehydrogenase (LDH)    Osteoarthrosis, unspecified whether generalized or localized, unspecified site    Regional enteritis of unspecified site    Restless legs syndrome (RLS)    TIA (transient ischemic attack)    Urinary frequency    Uterine prolapse without mention of vaginal wall prolapse    Vitamin deficiency    Past Surgical History:  Procedure Laterality Date   ABDOMINAL HYSTERECTOMY  1997   TAH/BSO, ENDOMETRIAL SARCOMA   ARTHROPLASTY     left knee   BREAST LUMPECTOMY Right 11/17/06   re-excision 01/13/07   CATARACT EXTRACTION Right 08/2019   CATARACT EXTRACTION Left 07/2019   TOTAL KNEE ARTHROPLASTY Left 06/10/2011       TOTAL KNEE ARTHROPLASTY Right 2006, 2009   Dr Para March   TOTAL KNEE  ARTHROPLASTY Left 2008   Dr Para March   WRIST SURGERY     carpel tunnel   Patient Active Problem List   Diagnosis Date Noted   History of sarcoma 05/27/2020   Pneumonia due to COVID-19 virus 09/15/2019   AF (paroxysmal atrial fibrillation) ([place]) 09/15/2019   Chronic anticoagulation 09/15/2019   Anemia, chronic disease 10/15/2017   Type 2 diabetes mellitus without complication, without long-term current use of insulin ([place]) 10/15/2017   Hyperlipidemia associated with type 2 diabetes mellitus ([place]) 10/15/2017   Claustrophobia 07/05/2015   Bilateral edema of lower extremity 09/11/2014   CKD stage 3 due to type 2 diabetes mellitus ([place]) 03/07/2014   Restless leg syndrome 11/29/2013   Other and unspecified hyperlipidemia 11/29/2013   Osteoarthritis 11/29/2013   CKD (chronic kidney disease) 08/16/2013   Insomnia 08/16/2013   Atrial flutter- not confirmed on ECG review 12/15/2012   Loss of weight 12/15/2012   Gastroparesis 12/14/2012   Renal insufficiency 12/14/2012   History of malignant neoplasm of right breast 01/01/2012   Arthritis    Hx of radiation therapy    OSTEOARTHRITIS, HANDS, BILATERAL 05/29/2009   CHEST PAIN, ATYPICAL 02/09/2008   LOW BACK PAIN, ACUTE 01/03/2007   Diabetes mellitus due to underlying condition with  diabetic nephropathy ([place]) 11/04/2006   HYPERCHOLESTEROLEMIA 11/04/2006   HYPERTENSION, BENIGN SYSTEMIC 11/04/2006   Cardiomyopathy-recovered 11/04/2006   MENOPAUSAL SYNDROME 11/04/2006   OSTEOARTHRITIS, LOWER LEG 11/04/2006   GAS/BLOATING 11/04/2006    ONSET DATE: 10/12/2022 (starting feeling off balance and weak)  REFERRING DIAG: E11.22,N18.31 (ICD-10-CM) - Type 2 diabetes mellitus with stage 3a chronic kidney disease, without long-term current use of insulin (HCC) G62.9 (ICD-10-CM) - Neuropathy R54 (ICD-10-CM) - Age-related physical debility  THERAPY DIAG:  Muscle weakness (generalized)  Unsteadiness on feet  Other abnormalities of gait and  mobility  Rationale for Evaluation and Treatment: Rehabilitation  SUBJECTIVE:                                                                                                                                                                                             SUBJECTIVE STATEMENT: "I feel like I am getting better.  I can go in the house without holding on."  She denies falls or near falls.  She reports she works on a lot of tasks similar to what is done in therapy outside of her written HEP.   Pt accompanied by: self  PERTINENT HISTORY: DM2 w/ neuropathy, hypercholestremia, HTN, OA, CKD, PAF w/ chronic anticoagulation therapy  PAIN:  Are you having pain? No  PRECAUTIONS: Fall and Other: chronic anticoagulation therapy for PAF; osteopenia  OBJECTIVE:   DIAGNOSTIC FINDINGS: No recent relevant imaging; bone density 01/13/2022 indicates osteopenia  COGNITION: Overall cognitive status: Within functional limits for tasks assessed   SENSATION: Light touch: WFL  COORDINATION: BLE RAMS:  WFL BLE Heel-to-shin:  WFL  EDEMA:  None noted bilaterally  MUSCLE TONE: None noted in BLE  POSTURE: forward head  LOWER EXTREMITY ROM:     Active  Right Eval Left Eval  Hip flexion [facility]  Hip extension    Hip abduction    Hip adduction    Hip internal rotation    Hip external rotation    Knee flexion    Knee extension " "  Ankle dorsiflexion " "  Ankle plantarflexion    Ankle inversion    Ankle eversion     (Blank rows = not tested)  LOWER EXTREMITY MMT:    MMT Right Eval Left Eval  Hip flexion 4-/5 3+/5  Hip extension    Hip abduction    Hip adduction    Hip internal rotation    Hip external rotation    Knee flexion    Knee extension 4/5 3+/5  Ankle dorsiflexion 5/5 3+/5  Ankle plantarflexion    Ankle inversion    Ankle eversion    (Blank  rows = not tested)  BED MOBILITY:  Pt reports independence  TRANSFERS: Assistive device utilized: None  Sit to stand:  Complete Independence Stand to sit: Complete Independence Chair to chair: Complete Independence  GAIT: Gait pattern: step through pattern, decreased arm swing- Right, decreased arm swing- Left, decreased stride length, shuffling, and narrow BOS Distance walked: various clinic distances Assistive device utilized: None Level of assistance: SBA Comments: Left adduction causing left insole to consistently swipe right foot during swing phase.  No scanning deficits noted.  FUNCTIONAL TESTS:  30 seconds chair stand test: 7 STS w/ BUE support 10 meter walk test: 14.15 seconds no AD = 0.71 m/sec OR 2.33 ft/sec Functional gait assessment: 12/30  [facility] PT Assessment - 11/30/22 0903       Functional Gait  Assessment   Gait assessed  Yes    Gait Level Surface Walks 20 ft in less than 7 sec but greater than 5.5 sec, uses assistive device, slower speed, mild gait deviations, or deviates 6-10 in outside of the 12 in walkway width.    Change in Gait Speed Able to change speed, demonstrates mild gait deviations, deviates 6-10 in outside of the 12 in walkway width, or no gait deviations, unable to achieve a major change in velocity, or uses a change in velocity, or uses an assistive device.    Gait with Horizontal Head Turns Performs head turns with moderate changes in gait velocity, slows down, deviates 10-15 in outside 12 in walkway width but recovers, can continue to walk.    Gait with Vertical Head Turns Performs task with slight change in gait velocity (eg, minor disruption to smooth gait path), deviates 6 - 10 in outside 12 in walkway width or uses assistive device    Gait and Pivot Turn Pivot turns safely in greater than 3 sec and stops with no loss of balance, or pivot turns safely within 3 sec and stops with mild imbalance, requires small steps to catch balance.    Step Over Obstacle Is able to step over one shoe box (4.5 in total height) but must slow down and adjust steps to clear box safely. May  require verbal cueing.    Gait with Narrow Base of Support Ambulates 4-7 steps.   4 steps   Gait with Eyes Closed Walks 20 ft, slow speed, abnormal gait pattern, evidence for imbalance, deviates 10-15 in outside 12 in walkway width. Requires more than 9 sec to ambulate 20 ft.    Ambulating Backwards Walks 20 ft, uses assistive device, slower speed, mild gait deviations, deviates 6-10 in outside 12 in walkway width.    Steps Alternating feet, must use rail.    Total Score 16    FGA comment: 16/30 = high fall risk            PATIENT SURVEYS:  ABC scale To be assessed.  TODAY'S TREATMENT:  She reports compliance to walking program.  She states her HEP is still challenging during verbal review. 30 second chair stand:  8 STS no UE support 10MWT no AD:  11.78 seconds = 0.85 m/sec OR 2.80 ft/sec FGA:  [facility] PT Assessment - 11/30/22 0903       Functional Gait  Assessment   Gait assessed  Yes    Gait Level Surface Walks 20 ft in less than 7 sec but greater than 5.5 sec, uses assistive device, slower speed, mild gait deviations, or deviates 6-10 in outside of the 12 in walkway width.    Change in Gait Speed Able to change speed, demonstrates mild gait deviations, deviates 6-10 in outside of the 12 in walkway width, or no gait deviations, unable to achieve a major change in velocity, or uses a change in velocity, or uses an assistive device.    Gait with Horizontal Head Turns Performs head turns with moderate changes in gait velocity, slows down, deviates 10-15 in outside 12 in walkway width but recovers, can continue to walk.    Gait with Vertical Head Turns Performs task with slight change in gait velocity (eg, minor disruption to smooth gait path), deviates 6 - 10 in outside 12 in walkway width or uses assistive device    Gait and Pivot Turn Pivot turns safely in greater  than 3 sec and stops with no loss of balance, or pivot turns safely within 3 sec and stops with mild imbalance, requires small steps to catch balance.    Step Over Obstacle Is able to step over one shoe box (4.5 in total height) but must slow down and adjust steps to clear box safely. May require verbal cueing.    Gait with Narrow Base of Support Ambulates 4-7 steps.   4 steps   Gait with Eyes Closed Walks 20 ft, slow speed, abnormal gait pattern, evidence for imbalance, deviates 10-15 in outside 12 in walkway width. Requires more than 9 sec to ambulate 20 ft.    Ambulating Backwards Walks 20 ft, uses assistive device, slower speed, mild gait deviations, deviates 6-10 in outside 12 in walkway width.    Steps Alternating feet, must use rail.    Total Score 16    FGA comment: 16/30 = high fall risk            Added to HEP: -walking at counter w/ head rotation working into full rotation progressing to no UE support, 4x10' -feet apart eyes open head turns x1 minute; no significant sway -feet apart eyes closed head turns x30 seconds, mild sway noted  Reviewed: -STS x10 no UE support -tandem walking forwards and backwards in // bars progressing to RUE support only, cued for improved heel-to-toe pattern as patient has smaller steps especially backwards -retro-stepping in // bars progressing to no UE support, step size shrinks w/o UE support, instructed to practice hovering hand over counter and larger retro steps clearing each foot past the other   PATIENT EDUCATION: Education details: Continue HEP, progress towards goals. Person educated: Patient Education method: Explanation Education comprehension: verbalized understanding  HOME EXERCISE PROGRAM: You Can Walk For A Certain Length Of Time Each Day                          Walk 10 minutes 1 times per day (4-5 days a week).             Increase 2  minutes every 7-14 days  Work up to 20 minutes (1-2 times per day).                Example:                         Day 1-2           4-5 minutes     3 times per day                         Day 7-8           10-12 minutes 2-3 times per day                         Day 13-14       20-22 minutes 1-2 times per day  Access Code: [email or web address] URL: [email or web address] Date: 11/11/2022 Prepared by: Elease Etienne  Exercises - Sit to Stand with Arms Crossed  - 1 x daily - 5 x weekly - 2-3 sets - 6-8 reps - Tandem Walking with Counter Support  - 1 x daily - 5 x weekly - 3 sets - 10 reps - Walking with Eyes Closed and Counter Support  - 1 x daily - 5 x weekly - 3 sets - 10 reps - Backward Walking with Counter Support  - 1 x daily - 5 x weekly - 3 sets - 10 reps - Walking with Head Rotation  - 1 x daily - 5 x weekly - 3 sets - 10 reps - Corner Balance Feet Apart: Eyes Closed With Head Turns  - 1 x daily - 5 x weekly - 3 sets - 10 reps  GOALS: Goals reviewed with patient? Yes  SHORT TERM GOALS: Target date: 12/04/2022  Pt will be independent with strength and balance HEP to promote functional LE strength and improved balance. Baseline:  Established and independent (3/25) Goal status: MET  2.  Pt will perform >/=10 sit to stands in 30 seconds w/o UE support to demonstrate decreased risk for falls and improved functional LE strength. Baseline: 7 STS w/ BUE support; 8 STS no UE support Goal status: IN PROGRESS  3.  Pt will demonstrate a gait speed of >/=2.73 feet/sec w/o AD in order to decrease risk for falls. Baseline: 2.33 ft/sec no AD; 2.80 ft/sec no AD (3/25) Goal status: MET  4.  Pt will improve FGA score to >/=16/30 in order to demonstrate improved balance and decreased fall risk. Baseline: 12/30; 16/30 (3/25) Goal status: MET  LONG TERM GOALS: Target date: 01/01/2023  Pt will report return to [facility] walking and regular exercise at least 1 day per week in order to progress toward baseline activity tolerance. Baseline:  Has stopped for now. Goal  status: INITIAL  2.  Pt will perform >/=13 sit to stands in 30 seconds w/o UE support to demonstrate decreased risk for falls and improved functional LE strength. Baseline: 7 STS w/ BUE support Goal status: INITIAL  3.  Pt will demonstrate a gait speed of >/=3.1 feet/sec w/o AD in order to decrease risk for falls. Baseline: 2.33 ft/sec no AD Goal status: INITIAL  4.  Pt will improve FGA score to >/=20/30 in order to demonstrate improved balance and decreased fall risk. Baseline: 12/30 Goal status: INITIAL  5.  Pt will improve ABC scale score to >/=50% in order to demonstrate decreased fear of falling. Baseline: 31.875% Goal status: INITIAL  ASSESSMENT:  CLINICAL IMPRESSION: Patient met or made significant progress towards all short term goals assessed this session.  Updates made to HEP based on progress noted today.  She completes 8 STS in 30 seconds without UE support versus 7 w/ BUE from evaluation.  Her ambulatory speed has improved to 2.80 ft/sec moving her into the lower percentile for community ambulators.  Her FGA score improved to 16/30 which does not change her fall risk level, but significantly progresses her towards the threshold for the moderate vs high category.  Anticipating patient will continue to make significant progress with ongoing skilled PT POC based on assessment.  OBJECTIVE IMPAIRMENTS: Abnormal gait, decreased activity tolerance, decreased balance, decreased strength, and postural dysfunction.   ACTIVITY LIMITATIONS: bending, squatting, stairs, and locomotion level  PARTICIPATION LIMITATIONS: community activity  PERSONAL FACTORS: Age, Fitness, Profession, and 3+ comorbidities: HTN, DM2 w/ neuropathy, PAF on chronic anticoagulation, OA, and hypercholestremia  are also affecting patient's functional outcome.   REHAB POTENTIAL: Good  CLINICAL DECISION MAKING: Evolving/moderate complexity  EVALUATION COMPLEXITY: Moderate  PLAN:  PT FREQUENCY: 1x/week  PT  DURATION: 8 weeks  PLANNED INTERVENTIONS: Therapeutic exercises, Therapeutic activity, Neuromuscular re-education, Balance training, Gait training, Patient/Family education, Self Care, Stair training, Vestibular training, DME instructions, and Re-evaluation  PLAN FOR NEXT SESSION:  Modify strength and balance HEP prn, dynamic balance-foam beam, monster walks, hurdles   Bary Richard, PT, DPT 11/30/2022, 9:33 AM

## 2022-11-30 NOTE — Patient Instructions (Signed)
Access Code: [email or web address] URL: [email or web address] Date: 11/30/2022 Prepared by: Elease Etienne  Exercises - Sit to Stand with Arms Crossed  - 1 x daily - 5 x weekly - 2-3 sets - 6-8 reps - Tandem Walking with Counter Support  - 1 x daily - 5 x weekly - 3 sets - 10 reps - Walking with Eyes Closed and Counter Support  - 1 x daily - 5 x weekly - 3 sets - 10 reps - Backward Walking with Counter Support  - 1 x daily - 5 x weekly - 3 sets - 10 reps - Walking with Head Rotation  - 1 x daily - 5 x weekly - 3 sets - 10 reps - Corner Balance Feet Apart: Eyes Closed With Head Turns  - 1 x daily - 5 x weekly - 3 sets - 10 reps

## 2022-12-07 ENCOUNTER — Ambulatory Visit (INDEPENDENT_AMBULATORY_CARE_PROVIDER_SITE_OTHER): Payer: Medicare HMO | Admitting: Nurse Practitioner

## 2022-12-07 ENCOUNTER — Encounter: Payer: Self-pay | Admitting: Nurse Practitioner

## 2022-12-07 VITALS — BP 144/82 | HR 64 | Temp 97.1°F | Ht 66.5 in | Wt 200.0 lb

## 2022-12-07 DIAGNOSIS — I10 Essential (primary) hypertension: Secondary | ICD-10-CM

## 2022-12-07 DIAGNOSIS — M545 Low back pain, unspecified: Secondary | ICD-10-CM

## 2022-12-07 DIAGNOSIS — E1122 Type 2 diabetes mellitus with diabetic chronic kidney disease: Secondary | ICD-10-CM

## 2022-12-07 DIAGNOSIS — G8929 Other chronic pain: Secondary | ICD-10-CM | POA: Diagnosis not present

## 2022-12-07 DIAGNOSIS — N1831 Chronic kidney disease, stage 3a: Secondary | ICD-10-CM | POA: Diagnosis not present

## 2022-12-07 DIAGNOSIS — R251 Tremor, unspecified: Secondary | ICD-10-CM

## 2022-12-07 MED ORDER — METFORMIN HCL 500 MG PO TABS: 500.0000 mg | ORAL_TABLET | Freq: Every day | ORAL | 3 refills | Status: DC

## 2022-12-07 NOTE — Patient Instructions (Signed)
Restart metformin 500 mg daily Continues Jardiance 10 mg daily  Continue to check blood sugars in the morning and as needed if feeling off.   Msg me if fasting blood sugars are not staying LESS than 150.

## 2022-12-07 NOTE — Progress Notes (Signed)
Careteam: Patient Care Team: Makayla Chandler, NP as PCP - General (Geriatric Medicine) Makayla Sprang, MD as PCP - Electrophysiology (Cardiology) [name], OD (Optometry)  PLACE OF SERVICE:  [place] Directive information Does Patient Have a Medical Advance Directive?: Yes, Type of Advance Directive: [place];Living will, Does patient want to make changes to medical advance directive?: No - Patient declined  Allergies  Allergen Reactions   Codeine Other (See Comments)   Hydrocodone Nausea And Vomiting   Lisinopril     Sick on the stomach    Chief Complaint  Patient presents with   Follow-up    Follow-up on diabetic medication. Patient out of Jardiance since Thursday. Patient did not request refill because she wanted to make sure she would continue prior to buying medication. Patient states her BS this morning was 245. Patient usually does not eat after 5 pm, FYI. Patient has b/p cuff for comparison, patient checked left arm, B/P was 190/78.      HPI: Patient is a 72 y.o. female for diabetic follow up.   She is no longer having any constipation/diarrhea.   She is has not picked up on her jardiance (ran out) but ready to pick.  Blood sugars ranging from 115-408, mostly elevated  No low blood sugars.   Doing PT for her back pain. When she stands and does the dishes it is worse.   Review of Systems:  Review of Systems  Constitutional:  Negative for chills, fever and weight loss.  HENT:  Negative for tinnitus.   Respiratory:  Negative for cough, sputum production and shortness of breath.   Cardiovascular:  Negative for chest pain, palpitations and leg swelling.  Gastrointestinal:  Negative for abdominal pain, constipation, diarrhea and heartburn.  Genitourinary:  Negative for dysuria, frequency and urgency.  Musculoskeletal:  Negative for back pain, falls, joint pain and myalgias.  Skin: Negative.   Neurological:  Negative for  dizziness and headaches.  Psychiatric/Behavioral:  Negative for depression and memory loss. The patient does not have insomnia.     Past Medical History:  Diagnosis Date   Anxiety    Arthritis    osteoarthritis   Breast cancer 2008   right, ER/PR -   CHF (congestive heart failure)    Depression    Diabetes mellitus    TYPE 2   Gastroparesis    Hx of radiation therapy 04/04/07 to 05/20/07   right breast/6260 cGy   Hypercholesterolemia    Hypertension    Lumbago    Malignant neoplasm of breast (female), unspecified site    Nonspecific elevation of levels of transaminase or lactic acid dehydrogenase (LDH)    Osteoarthrosis, unspecified whether generalized or localized, unspecified site    Regional enteritis of unspecified site    Restless legs syndrome (RLS)    TIA (transient ischemic attack)    Urinary frequency    Uterine prolapse without mention of vaginal wall prolapse    Vitamin deficiency    Past Surgical History:  Procedure Laterality Date   ABDOMINAL HYSTERECTOMY  1997   TAH/BSO, ENDOMETRIAL SARCOMA   ARTHROPLASTY     left knee   BREAST LUMPECTOMY Right 11/17/06   re-excision 01/13/07   CATARACT EXTRACTION Right 08/2019   CATARACT EXTRACTION Left 07/2019   TOTAL KNEE ARTHROPLASTY Left 06/10/2011       TOTAL KNEE ARTHROPLASTY Right 2006, 2009   [name]   TOTAL KNEE ARTHROPLASTY Left 2008   [name]  WRIST SURGERY     carpel tunnel   Social History:   reports that she quit smoking about 31 years ago. Her smoking use included cigarettes. She has a 10.00 pack-year smoking history. She has never used smokeless tobacco. She reports that she does not drink alcohol and does not use drugs.  Family History  Problem Relation Age of Onset   Diabetes Mother    Diabetes Father    Aneurysm Sister    Arthritis Sister    Diabetes Brother    Heart Problems Brother    Colon cancer Neg Hx     Medications: Patient's Medications  New Prescriptions   No medications on  file  Previous Medications   ACCU-CHEK FASTCLIX LANCETS MISC    Check blood sugar once daily as directed E11.59   ACCU-CHEK GUIDE TEST STRIP    CHECK BLOOD SUGAR ONCE     DAILY AS DIRECTED.   ACETAMINOPHEN (TYLENOL) 500 MG TABLET    Take 1,000 mg by mouth daily.   AMLODIPINE (NORVASC) 10 MG TABLET    TAKE 1/2 TABLET EVERY DAY   ASCORBIC ACID (VITAMIN C) 500 MG TABLET    Take 1 tablet by mouth daily.   BLOOD GLUCOSE MONITORING SUPPL (ACCU-CHEK AVIVA PLUS) W/DEVICE KIT    Check blood sugar once daily as directed E11.59  Acc-Chek guide   CARVEDILOL (COREG) 25 MG TABLET    TAKE 1 TABLET TWICE DAILY WITH MEALS   ELIQUIS 5 MG TABS TABLET    TAKE 1 TABLET BY MOUTH TWICE A DAY   EMPAGLIFLOZIN (JARDIANCE) 10 MG TABS TABLET    Take 1 tablet (10 mg total) by mouth daily before breakfast.   FERROUS SULFATE (IRON) 325 (65 FE) MG TABS    Take by mouth daily.   LOSARTAN (COZAAR) 100 MG TABLET    TAKE 1 TABLET EVERY DAY FOR BLOOD PRESSURE   MENTHOL, TOPICAL ANALGESIC, (BIOFREEZE) 4 % GEL    Apply 3 oz topically 3 (three) times daily as needed. Apply to both knees for pain   POLYETHYLENE GLYCOL (MIRALAX / GLYCOLAX) 17 G PACKET    Take 17 g by mouth daily.   ROSUVASTATIN (CRESTOR) 40 MG TABLET    TAKE 1 TABLET EVERY DAY   TRIAMCINOLONE OINTMENT (KENALOG) 0.1 %    as needed.   VIBEGRON (GEMTESA) 75 MG TABS    Take 1 tablet by mouth daily.  Modified Medications   No medications on file  Discontinued Medications   METFORMIN (GLUCOPHAGE) 1000 MG TABLET    TAKE 1 TABLET TWICE DAILY WITH MEALS    Physical Exam:  Vitals:   12/07/22 0754 12/07/22 0858  BP: (!) 146/94 (!) 144/82  Pulse: 64   Temp: (!) 97.1 F (36.2 C)   TempSrc: Temporal   SpO2: 97%   Weight: 200 lb (90.7 kg)   Height: 5' 6.5" (1.689 m)    Body mass index is 31.8 kg/m. Wt Readings from Last 3 Encounters:  12/07/22 200 lb (90.7 kg)  10/30/22 200 lb 6.4 oz (90.9 kg)  07/14/22 204 lb (92.5 kg)    Physical Exam Constitutional:       General: She is not in acute distress.    Appearance: She is well-developed. She is not diaphoretic.  HENT:     Head: Normocephalic and atraumatic.     Mouth/Throat:     Pharynx: No oropharyngeal exudate.  Eyes:     Conjunctiva/sclera: Conjunctivae normal.     Pupils: Pupils are equal, round,  and reactive to light.  Cardiovascular:     Rate and Rhythm: Normal rate and regular rhythm.     Heart sounds: Normal heart sounds.  Pulmonary:     Effort: Pulmonary effort is normal.     Breath sounds: Normal breath sounds.  Abdominal:     General: Bowel sounds are normal.     Palpations: Abdomen is soft.  Musculoskeletal:     Cervical back: Normal range of motion and neck supple.     Right lower leg: No edema.     Left lower leg: No edema.  Skin:    General: Skin is warm and dry.  Neurological:     Mental Status: She is alert.  Psychiatric:        Mood and Affect: Mood normal.     Labs reviewed: Basic Metabolic Panel: Recent Labs    04/24/22 0945 10/30/22 0915  NA 139 142  K 4.5 3.5  CL 105 106  CO2 24 26  GLUCOSE 90 130*  BUN 31* 24  CREATININE 1.55* 1.42*  CALCIUM 9.3 9.9  TSH  --  0.83   Liver Function Tests: Recent Labs    04/24/22 0945 10/30/22 0915  AST 15 16  ALT 9 11  BILITOT 0.6 0.4  PROT 6.7 7.7   No results for input(s): "LIPASE", "AMYLASE" in the last 8760 hours. No results for input(s): "AMMONIA" in the last 8760 hours. CBC: Recent Labs    04/24/22 0945 10/30/22 0915  WBC 4.7 4.4  NEUTROABS 2,942 2,662  HGB 11.3* 11.7  HCT 33.1* 35.2  MCV 94.8 91.7  PLT 176 209   Lipid Panel: No results for input(s): "CHOL", "HDL", "LDLCALC", "TRIG", "CHOLHDL", "LDLDIRECT" in the last 8760 hours. TSH: Recent Labs    10/30/22 0915  TSH 0.83   A1C: Lab Results  Component Value Date   HGBA1C 7.4 (H) 10/30/2022     Assessment/Plan 1. Type 2 diabetes mellitus with stage 3a chronic kidney disease, without long-term current use of insulin -not  controlled, continue on jardiance 10 mg daily with dietary modifications will add metformin 500 mg by mouth daily back to regimen If bowels become abnormal to notify, if fasting blood sugar staying over 150 to notify.  Encouraged dietary compliance, routine foot care/monitoring and to keep up with diabetic eye exams through ophthalmology  - metFORMIN (GLUCOPHAGE) 500 MG tablet; Take 1 tablet (500 mg total) by mouth daily with breakfast.  Dispense: 180 tablet; Refill: 3  2. HYPERTENSION, BENIGN SYSTEMIC Home blood pressure cuff a lot higher than manual reading. -continue current regimen with dietary modifications.   3. Tremor -improved at this time.   4. Chronic bilateral low back pain without sciatica -continues with PT.    Return in about 4 weeks (around 01/04/2023) for blood sugar.  [name]. [place], [facility] [phone]

## 2022-12-09 ENCOUNTER — Ambulatory Visit: Payer: Medicare HMO | Attending: Nurse Practitioner | Admitting: Physical Therapy

## 2022-12-09 ENCOUNTER — Encounter: Payer: Self-pay | Admitting: Physical Therapy

## 2022-12-09 DIAGNOSIS — R2681 Unsteadiness on feet: Secondary | ICD-10-CM | POA: Insufficient documentation

## 2022-12-09 DIAGNOSIS — R2689 Other abnormalities of gait and mobility: Secondary | ICD-10-CM

## 2022-12-09 DIAGNOSIS — M6281 Muscle weakness (generalized): Secondary | ICD-10-CM | POA: Diagnosis not present

## 2022-12-09 NOTE — Therapy (Signed)
OUTPATIENT PHYSICAL THERAPY NEURO TREATMENT   Patient Name: [name] MRN: [id number] DOB:Jun 19, 1951, 72 y.o., female Today's Date: 12/09/2022   PCP: Lauree Chandler, NP REFERRING PROVIDER: Lauree Chandler, NP  END OF SESSION:  PT End of Session - 12/09/22 0927     Visit Number 6    Number of Visits 9   8+eval   Date for PT Re-Evaluation 01/01/23    Authorization Type [employer] MEDICARE    Authorization Time Period 11/03/22 - 01/01/23    PT Start Time 0926    PT Stop Time 1008    PT Time Calculation (min) 42 min    Equipment Utilized During Treatment Gait belt    Activity Tolerance Patient tolerated treatment well    Behavior During Therapy WFL for tasks assessed/performed             Past Medical History:  Diagnosis Date   Anxiety    Arthritis    osteoarthritis   Breast cancer 2008   right, ER/PR -   CHF (congestive heart failure)    Depression    Diabetes mellitus    TYPE 2   Gastroparesis    Hx of radiation therapy 04/04/07 to 05/20/07   right breast/6260 cGy   Hypercholesterolemia    Hypertension    Lumbago    Malignant neoplasm of breast (female), unspecified site    Nonspecific elevation of levels of transaminase or lactic acid dehydrogenase (LDH)    Osteoarthrosis, unspecified whether generalized or localized, unspecified site    Regional enteritis of unspecified site    Restless legs syndrome (RLS)    TIA (transient ischemic attack)    Urinary frequency    Uterine prolapse without mention of vaginal wall prolapse    Vitamin deficiency    Past Surgical History:  Procedure Laterality Date   ABDOMINAL HYSTERECTOMY  1997   TAH/BSO, ENDOMETRIAL SARCOMA   ARTHROPLASTY     left knee   BREAST LUMPECTOMY Right 11/17/06   re-excision 01/13/07   CATARACT EXTRACTION Right 08/2019   CATARACT EXTRACTION Left 07/2019   TOTAL KNEE ARTHROPLASTY Left 06/10/2011       TOTAL KNEE ARTHROPLASTY Right 2006, 2009   Dr Para March   TOTAL KNEE ARTHROPLASTY Left  2008   Dr Para March   WRIST SURGERY     carpel tunnel   Patient Active Problem List   Diagnosis Date Noted   History of sarcoma 05/27/2020   Pneumonia due to COVID-19 virus 09/15/2019   AF (paroxysmal atrial fibrillation) 09/15/2019   Chronic anticoagulation 09/15/2019   Anemia, chronic disease 10/15/2017   Type 2 diabetes mellitus without complication, without long-term current use of insulin 10/15/2017   Hyperlipidemia associated with type 2 diabetes mellitus 10/15/2017   Claustrophobia 07/05/2015   Bilateral edema of lower extremity 09/11/2014   CKD stage 3 due to type 2 diabetes mellitus 03/07/2014   Restless leg syndrome 11/29/2013   Other and unspecified hyperlipidemia 11/29/2013   Osteoarthritis 11/29/2013   CKD (chronic kidney disease) 08/16/2013   Insomnia 08/16/2013   Atrial flutter- not confirmed on ECG review 12/15/2012   Loss of weight 12/15/2012   Gastroparesis 12/14/2012   Renal insufficiency 12/14/2012   History of malignant neoplasm of right breast 01/01/2012   Arthritis    Hx of radiation therapy    OSTEOARTHRITIS, HANDS, BILATERAL 05/29/2009   CHEST PAIN, ATYPICAL 02/09/2008   LOW BACK PAIN, ACUTE 01/03/2007   Diabetes mellitus due to underlying condition with diabetic nephropathy ([place]) 11/04/2006  HYPERCHOLESTEROLEMIA 11/04/2006   HYPERTENSION, BENIGN SYSTEMIC 11/04/2006   Cardiomyopathy-recovered 11/04/2006   MENOPAUSAL SYNDROME 11/04/2006   OSTEOARTHRITIS, LOWER LEG 11/04/2006   GAS/BLOATING 11/04/2006    ONSET DATE: 10/12/2022 (starting feeling off balance and weak)  REFERRING DIAG: E11.22,N18.31 (ICD-10-CM) - Type 2 diabetes mellitus with stage 3a chronic kidney disease, without long-term current use of insulin (HCC) G62.9 (ICD-10-CM) - Neuropathy R54 (ICD-10-CM) - Age-related physical debility  THERAPY DIAG:  Muscle weakness (generalized)  Unsteadiness on feet  Other abnormalities of gait and mobility  Rationale for Evaluation and Treatment:  Rehabilitation  SUBJECTIVE:                                                                                                                                                                                             SUBJECTIVE STATEMENT: She denies fall or near falls.  States she went to the park and that went okay.  She is no longer furniture walking.  Pt accompanied by: self  PERTINENT HISTORY: DM2 w/ neuropathy, hypercholestremia, HTN, OA, CKD, PAF w/ chronic anticoagulation therapy  PAIN:  Are you having pain? Yes: NPRS scale: 3/10 Pain location: left knee Pain description: achy Aggravating factors: walking Relieving factors: Tylenol  PRECAUTIONS: Fall and Other: chronic anticoagulation therapy for PAF; osteopenia  OBJECTIVE:   DIAGNOSTIC FINDINGS: No recent relevant imaging; bone density 01/13/2022 indicates osteopenia  COGNITION: Overall cognitive status: Within functional limits for tasks assessed   SENSATION: Light touch: WFL  COORDINATION: BLE RAMS:  WFL BLE Heel-to-shin:  WFL  EDEMA:  None noted bilaterally  MUSCLE TONE: None noted in BLE  POSTURE: forward head  LOWER EXTREMITY ROM:     Active  Right Eval Left Eval  Hip flexion [facility]  Hip extension    Hip abduction    Hip adduction    Hip internal rotation    Hip external rotation    Knee flexion    Knee extension " "  Ankle dorsiflexion " "  Ankle plantarflexion    Ankle inversion    Ankle eversion     (Blank rows = not tested)  LOWER EXTREMITY MMT:    MMT Right Eval Left Eval  Hip flexion 4-/5 3+/5  Hip extension    Hip abduction    Hip adduction    Hip internal rotation    Hip external rotation    Knee flexion    Knee extension 4/5 3+/5  Ankle dorsiflexion 5/5 3+/5  Ankle plantarflexion    Ankle inversion    Ankle eversion    (Blank rows = not tested)  BED MOBILITY:  Pt reports independence  TRANSFERS:  Assistive device utilized: None  Sit to stand: Complete  Independence Stand to sit: Complete Independence Chair to chair: Complete Independence  GAIT: Gait pattern: step through pattern, decreased arm swing- Right, decreased arm swing- Left, decreased stride length, shuffling, and narrow BOS Distance walked: various clinic distances Assistive device utilized: None Level of assistance: SBA Comments: Left adduction causing left insole to consistently swipe right foot during swing phase.  No scanning deficits noted.  FUNCTIONAL TESTS:  30 seconds chair stand test: 7 STS w/ BUE support 10 meter walk test: 14.15 seconds no AD = 0.71 m/sec OR 2.33 ft/sec Functional gait assessment: 12/30   PATIENT SURVEYS:  ABC scale To be assessed.  TODAY'S TREATMENT:                                                                                                                              Tandem on foam beam progressing to 3 fingertip support over 8x10' Lateral stepping on foam beam progressing to 3 fingertip support 6x10' Holding steady standing feet apart on foam beam 3x20 seconds no UE support, mild forward trunk lean Heel and toe raises on foam beam progressing to no UE support w/ intermittent touch to // bars for support, several reps performed 4" hurdles forward facing 6x8' progressing to no UE support w/ step-to pattern > 8" hurdles forward facing 8x8' progressing to no UE support w/ step-to pattern On Airex:  W/o UE support, feet apart 4x30 seconds alternating eyes open to eyes closed > feet together 4x30 seconds alternating eyes open to eyes closed > tandem eyes open alternating LE in rear x1 minute w/ unilateral UE support > feet apart eyes open head turns x1 minute, mild trunk sway > feet apart eyes open head nods x1 minute, moderate trunk sway  PATIENT EDUCATION: Education details: Continue HEP.  Discussed using cane on initial ventures into crowd as pt is still hesitant about going into crowd due to fear of being bumped and falling.  Discussed cane  acting as signal to others inadvertently which may make her feel more secure.  Encouraged to gradually go on outings without cane to build confidence and prevent activity avoidance and social withdrawal. Person educated: Patient Education method: Explanation Education comprehension: verbalized understanding  HOME EXERCISE PROGRAM: You Can Walk For A Certain Length Of Time Each Day                          Walk 10 minutes 1 times per day (4-5 days a week).             Increase 2  minutes every 7-14 days              Work up to 20 minutes (1-2 times per day).               Example:  Day 1-2           4-5 minutes     3 times per day                         Day 7-8           10-12 minutes 2-3 times per day                         Day 13-14       20-22 minutes 1-2 times per day  Access Code: [email or web address] URL: [email or web address] Date: 12/09/2022 Prepared by: Elease Etienne  Exercises - Sit to Stand with Arms Crossed  - 1 x daily - 5 x weekly - 2-3 sets - 6-8 reps - Tandem Walking with Counter Support  - 1 x daily - 5 x weekly - 3 sets - 10 reps - Walking with Eyes Closed and Counter Support  - 1 x daily - 5 x weekly - 3 sets - 10 reps - Backward Walking with Counter Support  - 1 x daily - 5 x weekly - 3 sets - 10 reps - Walking with Head Rotation  - 1 x daily - 5 x weekly - 3 sets - 10 reps - Corner Balance Feet Apart: Eyes Closed With Head Turns  - 1 x daily - 5 x weekly - 3 sets - 10 reps - Romberg Stance Eyes Closed on Foam Pad  - 1 x daily - 7 x weekly - 3 sets - 10 reps  GOALS: Goals reviewed with patient? Yes  SHORT TERM GOALS: Target date: 12/04/2022  Pt will be independent with strength and balance HEP to promote functional LE strength and improved balance. Baseline:  Established and independent (3/25) Goal status: MET  2.  Pt will perform >/=10 sit to stands in 30 seconds w/o UE support to demonstrate decreased risk for falls and  improved functional LE strength. Baseline: 7 STS w/ BUE support; 8 STS no UE support Goal status: IN PROGRESS  3.  Pt will demonstrate a gait speed of >/=2.73 feet/sec w/o AD in order to decrease risk for falls. Baseline: 2.33 ft/sec no AD; 2.80 ft/sec no AD (3/25) Goal status: MET  4.  Pt will improve FGA score to >/=16/30 in order to demonstrate improved balance and decreased fall risk. Baseline: 12/30; 16/30 (3/25) Goal status: MET  LONG TERM GOALS: Target date: 01/01/2023  Pt will report return to [facility] walking and regular exercise at least 1 day per week in order to progress toward baseline activity tolerance. Baseline:  Has stopped for now. Goal status: INITIAL  2.  Pt will perform >/=13 sit to stands in 30 seconds w/o UE support to demonstrate decreased risk for falls and improved functional LE strength. Baseline: 7 STS w/ BUE support Goal status: INITIAL  3.  Pt will demonstrate a gait speed of >/=3.1 feet/sec w/o AD in order to decrease risk for falls. Baseline: 2.33 ft/sec no AD Goal status: INITIAL  4.  Pt will improve FGA score to >/=20/30 in order to demonstrate improved balance and decreased fall risk. Baseline: 12/30 Goal status: INITIAL  5.  Pt will improve ABC scale score to >/=50% in order to demonstrate decreased fear of falling. Baseline: 31.875% Goal status: INITIAL  ASSESSMENT:  CLINICAL IMPRESSION: Emphasis of skilled session on continued static and dynamic stability work with patient showing great improvement compared to initial visits.  She progresses to  most tasks without need for UE support with notable appropriate righting reactions when on compliant surfaces.  She remains nervous about unlevel surfaces and crowds with discussion today of using cane in crowds to prevent social withdrawal and build confidence to challenge.  In all other contexts, the patient appears safe without need for an AD.  She continues to benefit from skilled PT to further address  dual tasking, dynamic stability, and functional strength to promote maintained independence with mobility.  OBJECTIVE IMPAIRMENTS: Abnormal gait, decreased activity tolerance, decreased balance, decreased strength, and postural dysfunction.   ACTIVITY LIMITATIONS: bending, squatting, stairs, and locomotion level  PARTICIPATION LIMITATIONS: community activity  PERSONAL FACTORS: Age, Fitness, Profession, and 3+ comorbidities: HTN, DM2 w/ neuropathy, PAF on chronic anticoagulation, OA, and hypercholestremia  are also affecting patient's functional outcome.   REHAB POTENTIAL: Good  CLINICAL DECISION MAKING: Evolving/moderate complexity  EVALUATION COMPLEXITY: Moderate  PLAN:  PT FREQUENCY: 1x/week  PT DURATION: 8 weeks  PLANNED INTERVENTIONS: Therapeutic exercises, Therapeutic activity, Neuromuscular re-education, Balance training, Gait training, Patient/Family education, Self Care, Stair training, Vestibular training, DME instructions, and Re-evaluation  PLAN FOR NEXT SESSION:  Modify strength and balance HEP prn, dynamic balance, monster walks, tilt board, dual tasking-blaze pods w/ animals/categories   Bary Richard, PT, DPT 12/09/2022, 10:21 AM

## 2022-12-09 NOTE — Patient Instructions (Signed)
Access Code: [email or web address] URL: [email or web address] Date: 12/09/2022 Prepared by: Elease Etienne  Exercises - Sit to Stand with Arms Crossed  - 1 x daily - 5 x weekly - 2-3 sets - 6-8 reps - Tandem Walking with Counter Support  - 1 x daily - 5 x weekly - 3 sets - 10 reps - Walking with Eyes Closed and Counter Support  - 1 x daily - 5 x weekly - 3 sets - 10 reps - Backward Walking with Counter Support  - 1 x daily - 5 x weekly - 3 sets - 10 reps - Walking with Head Rotation  - 1 x daily - 5 x weekly - 3 sets - 10 reps - Corner Balance Feet Apart: Eyes Closed With Head Turns  - 1 x daily - 5 x weekly - 3 sets - 10 reps - Romberg Stance Eyes Closed on Foam Pad  - 1 x daily - 7 x weekly - 3 sets - 10 reps

## 2022-12-10 ENCOUNTER — Other Ambulatory Visit: Payer: Self-pay

## 2022-12-10 MED ORDER — ACCU-CHEK FASTCLIX LANCETS MISC: 1 refills | Status: DC

## 2022-12-10 MED ORDER — ACCU-CHEK GUIDE VI STRP: ORAL_STRIP | 12 refills | Status: DC

## 2022-12-10 MED ORDER — BD SWABS SINGLE USE BUTTERFLY PADS: 1.0000 | MEDICATED_PAD | 1 refills | Status: DC | PRN

## 2022-12-10 MED ORDER — ACCU-CHEK GUIDE ME W/DEVICE KIT: 1.0000 | PACK | 0 refills | Status: AC | PRN

## 2022-12-16 ENCOUNTER — Ambulatory Visit: Payer: Medicare HMO | Admitting: Physical Therapy

## 2022-12-16 ENCOUNTER — Encounter: Payer: Self-pay | Admitting: Physical Therapy

## 2022-12-16 DIAGNOSIS — R2681 Unsteadiness on feet: Secondary | ICD-10-CM | POA: Diagnosis not present

## 2022-12-16 DIAGNOSIS — M6281 Muscle weakness (generalized): Secondary | ICD-10-CM | POA: Diagnosis not present

## 2022-12-16 DIAGNOSIS — R2689 Other abnormalities of gait and mobility: Secondary | ICD-10-CM

## 2022-12-16 NOTE — Patient Instructions (Signed)
-   Forward Backward Monster Walk with Band at [employer]  - 1 x daily - 5 x weekly - 3 sets - 10 reps

## 2022-12-16 NOTE — Therapy (Signed)
OUTPATIENT PHYSICAL THERAPY NEURO TREATMENT   Patient Name: [name] MRN: [id number] DOB:1950-11-07, 72 y.o., female Today's Date: 12/16/2022   PCP: Sharon Seller, NP REFERRING PROVIDER: Sharon Seller, NP  END OF SESSION:  PT End of Session - 12/16/22 0921     Visit Number 7    Number of Visits 9   8+eval   Date for PT Re-Evaluation 01/01/23    Authorization Type [employer]IC[employer]    Authorization Time Period 11/03/22 - 01/01/23    PT Start Time 0930    PT Stop Time 1019    PT Time Calculation (min) 49 min    Equipment Utilized During Treatment Gait belt    Activity Tolerance Patient tolerated treatment well    Behavior During Therapy WFL for tasks assessed/performed             Past Medical History:  Diagnosis Date   Anxiety    Arthritis    osteoarthritis   Breast cancer 2008   right, ER/PR -   CHF (congestive heart failure)    Depression    Diabetes mellitus    TYPE 2   Gastroparesis    Hx of radiation therapy 04/04/07 to 05/20/07   right breast/6260 cGy   Hypercholesterolemia    Hypertension    Lumbago    Malignant neoplasm of breast (female), unspecified site    Nonspecific elevation of levels of transaminase or lactic acid dehydrogenase (LDH)    Osteoarthrosis, unspecified whether generalized or localized, unspecified site    Regional enteritis of unspecified site    Restless legs syndrome (RLS)    TIA (transient ischemic attack)    Urinary frequency    Uterine prolapse without mention of vaginal wall prolapse    Vitamin deficiency    Past Surgical History:  Procedure Laterality Date   ABDOMINAL HYSTERECTOMY  1997   TAH/BSO, ENDOMETRIAL SARCOMA   ARTHROPLASTY     left knee   BREAST LUMPECTOMY Right 11/17/06   re-excision 01/13/07   CATARACT EXTRACTION Right 08/2019   CATARACT EXTRACTION Left 07/2019   TOTAL KNEE ARTHROPLASTY Left 06/10/2011       TOTAL KNEE ARTHROPLASTY Right 2006, 2009   Dr Wyline Mood   TOTAL KNEE ARTHROPLASTY Left  2008   Dr Wyline Mood   WRIST SURGERY     carpel tunnel   Patient Active Problem List   Diagnosis Date Noted   History of sarcoma 05/27/2020   Pneumonia due to COVID-19 virus 09/15/2019   AF (paroxysmal atrial fibrillation) 09/15/2019   Chronic anticoagulation 09/15/2019   Anemia, chronic disease 10/15/2017   Type 2 diabetes mellitus without complication, without long-term current use of insulin 10/15/2017   Hyperlipidemia associated with type 2 diabetes mellitus 10/15/2017   Claustrophobia 07/05/2015   Bilateral edema of lower extremity 09/11/2014   CKD stage 3 due to type 2 diabetes mellitus 03/07/2014   Restless leg syndrome 11/29/2013   Other and unspecified hyperlipidemia 11/29/2013   Osteoarthritis 11/29/2013   CKD (chronic kidney disease) 08/16/2013   Insomnia 08/16/2013   Atrial flutter- not confirmed on ECG review 12/15/2012   Loss of weight 12/15/2012   Gastroparesis 12/14/2012   Renal insufficiency 12/14/2012   History of malignant neoplasm of right breast 01/01/2012   Arthritis    Hx of radiation therapy    OSTEOARTHRITIS, HANDS, BILATERAL 05/29/2009   CHEST PAIN, ATYPICAL 02/09/2008   LOW BACK PAIN, ACUTE 01/03/2007   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) 11/04/2006  HYPERCHOLESTEROLEMIA 11/04/2006   HYPERTENSION, BENIGN SYSTEMIC 11/04/2006   Cardiomyopathy-recovered 11/04/2006   MENOPAUSAL SYNDROME 11/04/2006   OSTEOARTHRITIS, LOWER LEG 11/04/2006   GAS/BLOATING 11/04/2006    ONSET DATE: 10/12/2022 (starting feeling off balance and weak)  REFERRING DIAG: E11.22,N18.31 (ICD-10-CM) - Type 2 diabetes mellitus with stage 3a chronic kidney disease, without long-term current use of insulin (HCC) G62.9 (ICD-10-CM) - Neuropathy R54 (ICD-10-CM) - Age-related physical debility  THERAPY DIAG:  Muscle weakness (generalized)  Unsteadiness on feet  Other abnormalities of gait and mobility  Rationale for Evaluation and Treatment:  Rehabilitation  SUBJECTIVE:                                                                                                                                                                                             SUBJECTIVE STATEMENT: She denies fall or near falls.  She continues exercising at the [facility]  Pt accompanied by: self  PERTINENT HISTORY: DM2 w/ neuropathy, hypercholestremia, HTN, OA, CKD, PAF w/ chronic anticoagulation therapy  PAIN:  Are you having pain? No  PRECAUTIONS: Fall and Other: chronic anticoagulation therapy for PAF; osteopenia  OBJECTIVE:   DIAGNOSTIC FINDINGS: No recent relevant imaging; bone density 01/13/2022 indicates osteopenia  COGNITION: Overall cognitive status: Within functional limits for tasks assessed   SENSATION: Light touch: WFL  COORDINATION: BLE RAMS:  WFL BLE Heel-to-shin:  WFL  EDEMA:  None noted bilaterally  MUSCLE TONE: None noted in BLE  POSTURE: forward head  LOWER EXTREMITY ROM:     Active  Right Eval Left Eval  Hip flexion [facility]  Hip extension    Hip abduction    Hip adduction    Hip internal rotation    Hip external rotation    Knee flexion    Knee extension " "  Ankle dorsiflexion " "  Ankle plantarflexion    Ankle inversion    Ankle eversion     (Blank rows = not tested)  LOWER EXTREMITY MMT:    MMT Right Eval Left Eval  Hip flexion 4-/5 3+/5  Hip extension    Hip abduction    Hip adduction    Hip internal rotation    Hip external rotation    Knee flexion    Knee extension 4/5 3+/5  Ankle dorsiflexion 5/5 3+/5  Ankle plantarflexion    Ankle inversion    Ankle eversion    (Blank rows = not tested)  BED MOBILITY:  Pt reports independence  TRANSFERS: Assistive device utilized: None  Sit to stand: Complete Independence Stand to sit: Complete Independence Chair to chair: Complete Independence  GAIT: Gait pattern: step through pattern,  decreased arm swing- Right, decreased arm swing-  Left, decreased stride length, shuffling, and narrow BOS Distance walked: various clinic distances Assistive device utilized: None Level of assistance: SBA Comments: Left adduction causing left insole to consistently swipe right foot during swing phase.  No scanning deficits noted.  FUNCTIONAL TESTS:  30 seconds chair stand test: 7 STS w/ BUE support 10 meter walk test: 14.15 seconds no AD = 0.71 m/sec OR 2.33 ft/sec Functional gait assessment: 12/30   PATIENT SURVEYS:  ABC scale To be assessed.  TODAY'S TREATMENT:                                                                                                                              Monster walks w/ red band around thigh x4 forwards > x4 backwards w/ countertop support, cues to improve lateral hip engagement and upright trunk Anteriorly oriented tilt board holding level x 1 minute no UE support > forward and backwards tilts no UE support progressing to increased ROM > laterally oriented tilt board holding level x 1 minute no UE support > lateral weight shifts progressed to full ROM > anteriorly oriented tilt board progressing to no UE support alternating LE midline cone taps Blaze pods (lateral stepping on blue mat no UE support, each round x1 minute): Round 1-3:  14, 16, and 16 taps Round 4: name animals, 14 taps, 2 repetitions/errors Rounds5-6: name foods (sub-category used for round 6), 16 and 12 taps, pt unable to continue tapping due and naming despite hints provided SciFit in multi-peaks mode x5 minutes on level 5.0 using BUE/BLE for dynamic cardiovascular endurance and generalized strengthening  PATIENT EDUCATION: Education details: Continue HEP and walking program. Person educated: Patient Education method: Explanation Education comprehension: verbalized understanding  HOME EXERCISE PROGRAM: You Can Walk For A Certain Length Of Time Each Day                          Walk 10 minutes 1 times per day (4-5 days a week).              Increase 2  minutes every 7-14 days              Work up to 20 minutes (1-2 times per day).               Example:                         Day 1-2           4-5 minutes     3 times per day                         Day 7-8           10-12 minutes 2-3 times per day  Day 13-14       20-22 minutes 1-2 times per day  Access Code: [email or web address] URL: [email or web address] Date: 12/09/2022 Prepared by: Camille BalMarissa Nalayah Hitt  Exercises - Sit to Stand with Arms Crossed  - 1 x daily - 5 x weekly - 2-3 sets - 6-8 reps - Tandem Walking with Counter Support  - 1 x daily - 5 x weekly - 3 sets - 10 reps - Walking with Eyes Closed and Counter Support  - 1 x daily - 5 x weekly - 3 sets - 10 reps - Backward Walking with Counter Support  - 1 x daily - 5 x weekly - 3 sets - 10 reps - Walking with Head Rotation  - 1 x daily - 5 x weekly - 3 sets - 10 reps - Corner Balance Feet Apart: Eyes Closed With Head Turns  - 1 x daily - 5 x weekly - 3 sets - 10 reps - Romberg Stance Eyes Closed on Foam Pad  - 1 x daily - 7 x weekly - 3 sets - 10 reps - Forward Backward Monster Walk with Band at Thighs and Counter Support  - 1 x daily - 5 x weekly - 3 sets - 10 reps  GOALS: Goals reviewed with patient? Yes  SHORT TERM GOALS: Target date: 12/04/2022  Pt will be independent with strength and balance HEP to promote functional LE strength and improved balance. Baseline:  Established and independent (3/25) Goal status: MET  2.  Pt will perform >/=10 sit to stands in 30 seconds w/o UE support to demonstrate decreased risk for falls and improved functional LE strength. Baseline: 7 STS w/ BUE support; 8 STS no UE support Goal status: IN PROGRESS  3.  Pt will demonstrate a gait speed of >/=2.73 feet/sec w/o AD in order to decrease risk for falls. Baseline: 2.33 ft/sec no AD; 2.80 ft/sec no AD (3/25) Goal status: MET  4.  Pt will improve FGA score to >/=16/30 in order to demonstrate  improved balance and decreased fall risk. Baseline: 12/30; 16/30 (3/25) Goal status: MET  LONG TERM GOALS: Target date: 01/01/2023  [facility] and regular exercise at least 1 day per week in order to progress toward baseline activity tolerance. Baseline:  Has stopped for now. Goal status: INITIAL  2.  Pt will perform >/=13 sit to stands in 30 seconds w/o UE support to demonstrate decreased risk for falls and improved functional LE strength. Baseline: 7 STS w/ BUE support Goal status: INITIAL  3.  Pt will demonstrate a gait speed of >/=3.1 feet/sec w/o AD in order to decrease risk for falls. Baseline: 2.33 ft/sec no AD Goal status: INITIAL  4.  Pt will improve FGA score to >/=20/30 in order to demonstrate improved balance and decreased fall risk. Baseline: 12/30 Goal status: INITIAL  5.  Pt will improve ABC scale score to >/=50% in order to demonstrate decreased fear of falling. Baseline: 31.875% Goal status: INITIAL  ASSESSMENT:  CLINICAL IMPRESSION: Focus of skilled session on progressing functional strength and static balance.  She does well with all interventions this session, but is most challenged by cognitive dual task noted by inability to continuously step and name food/animal.  She continues to benefit from skilled PT to address functional deficits as outlined in ongoing skilled PT POC with patient progressing towards LTGs as expected.  OBJECTIVE IMPAIRMENTS: Abnormal gait, decreased activity tolerance, decreased balance, decreased strength, and postural dysfunction.   ACTIVITY LIMITATIONS: bending, squatting, stairs,  and locomotion level  PARTICIPATION LIMITATIONS: community activity  PERSONAL FACTORS: Age, Fitness, Profession, and 3+ comorbidities: HTN, DM2 w/ neuropathy, PAF on chronic anticoagulation, OA, and hypercholestremia  are also affecting patient's functional outcome.   REHAB POTENTIAL: Good  CLINICAL DECISION MAKING:  Evolving/moderate complexity  EVALUATION COMPLEXITY: Moderate  PLAN:  PT FREQUENCY: 1x/week  PT DURATION: 8 weeks  PLANNED INTERVENTIONS: Therapeutic exercises, Therapeutic activity, Neuromuscular re-education, Balance training, Gait training, Patient/Family education, Self Care, Stair training, Vestibular training, DME instructions, and Re-evaluation  PLAN FOR NEXT SESSION:  Modify strength and balance HEP prn, dynamic balance, dual tasking-blaze pods w/ animals/categories using 2 colors, obstacle course, try BOSU?   Sadie Haber, PT, DPT 12/16/2022, 10:23 AM

## 2022-12-17 DIAGNOSIS — N3941 Urge incontinence: Secondary | ICD-10-CM | POA: Diagnosis not present

## 2022-12-17 DIAGNOSIS — R350 Frequency of micturition: Secondary | ICD-10-CM | POA: Diagnosis not present

## 2022-12-23 ENCOUNTER — Encounter: Payer: Self-pay | Admitting: Physical Therapy

## 2022-12-23 ENCOUNTER — Ambulatory Visit: Payer: Medicare HMO | Admitting: Physical Therapy

## 2022-12-23 DIAGNOSIS — M6281 Muscle weakness (generalized): Secondary | ICD-10-CM | POA: Diagnosis not present

## 2022-12-23 DIAGNOSIS — R2689 Other abnormalities of gait and mobility: Secondary | ICD-10-CM

## 2022-12-23 DIAGNOSIS — R2681 Unsteadiness on feet: Secondary | ICD-10-CM

## 2022-12-23 NOTE — Therapy (Signed)
OUTPATIENT PHYSICAL THERAPY NEURO TREATMENT   Patient Name: [name] MRN: [id number] DOB:[birth year], 71 y.o., female Today's Date: 12/23/2022   PCP: Makayla Seller, NP REFERRING PROVIDER: Sharon Seller, NP  END OF SESSION:  PT End of Session - 12/23/22 0938     Visit Number 8    Number of Visits 9   8+eval   Date for PT Re-Evaluation 01/01/23    Authorization Type [employer]IC[employer]    Authorization Time Period 11/03/22 - 01/01/23    PT Start Time 0935    PT Stop Time 1012    PT Time Calculation (min) 37 min    Equipment Utilized During Treatment Gait belt    Activity Tolerance Patient tolerated treatment well    Behavior During Therapy WFL for tasks assessed/performed             Past Medical History:  Diagnosis Date   Anxiety    Arthritis    osteoarthritis   Breast cancer 2008   right, ER/PR -   CHF (congestive heart failure)    Depression    Diabetes mellitus    TYPE 2   Gastroparesis    Hx of radiation therapy 04/04/07 to 05/20/07   right breast/6260 cGy   Hypercholesterolemia    Hypertension    Lumbago    Malignant neoplasm of breast (female), unspecified site    Nonspecific elevation of levels of transaminase or lactic acid dehydrogenase (LDH)    Osteoarthrosis, unspecified whether generalized or localized, unspecified site    Regional enteritis of unspecified site    Restless legs syndrome (RLS)    TIA (transient ischemic attack)    Urinary frequency    Uterine prolapse without mention of vaginal wall prolapse    Vitamin deficiency    Past Surgical History:  Procedure Laterality Date   ABDOMINAL HYSTERECTOMY  1997   TAH/BSO, ENDOMETRIAL SARCOMA   ARTHROPLASTY     left knee   BREAST LUMPECTOMY Right 11/17/06   re-excision 01/13/07   CATARACT EXTRACTION Right 08/2019   CATARACT EXTRACTION Left 07/2019   TOTAL KNEE ARTHROPLASTY Left 06/10/2011       TOTAL KNEE ARTHROPLASTY Right 2006, 2009   Dr [name]   TOTAL KNEE ARTHROPLASTY Left  2008   Dr [name]   WRIST SURGERY     carpel tunnel   Patient Active Problem List   Diagnosis Date Noted   History of sarcoma 05/27/2020   Pneumonia due to COVID-19 virus 09/15/2019   AF (paroxysmal atrial fibrillation) 09/15/2019   Chronic anticoagulation 09/15/2019   Anemia, chronic disease 10/15/2017   Type 2 diabetes mellitus without complication, without long-term current use of insulin 10/15/2017   Hyperlipidemia associated with type 2 diabetes mellitus 10/15/2017   Claustrophobia 07/05/2015   Bilateral edema of lower extremity 09/11/2014   CKD stage 3 due to type 2 diabetes mellitus 03/07/2014   Restless leg syndrome 11/29/2013   Other and unspecified hyperlipidemia 11/29/2013   Osteoarthritis 11/29/2013   CKD (chronic kidney disease) 08/16/2013   Insomnia 08/16/2013   Atrial flutter- not confirmed on ECG review 12/15/2012   Loss of weight 12/15/2012   Gastroparesis 12/14/2012   Renal insufficiency 12/14/2012   History of malignant neoplasm of right breast 01/01/2012   Arthritis    Hx of radiation therapy    OSTEOARTHRITIS, HANDS, BILATERAL 05/29/2009   CHEST PAIN, ATYPICAL 02/09/2008   LOW BACK PAIN, ACUTE 01/03/2007   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) 11/04/2006  HYPERCHOLESTEROLEMIA 11/04/2006   HYPERTENSION, BENIGN SYSTEMIC 11/04/2006   Cardiomyopathy-recovered 11/04/2006   MENOPAUSAL SYNDROME 11/04/2006   OSTEOARTHRITIS, LOWER LEG 11/04/2006   GAS/BLOATING 11/04/2006    ONSET DATE: 10/12/2022 (starting feeling off balance and weak)  REFERRING DIAG: E11.22,N18.31 (ICD-10-CM) - Type 2 diabetes mellitus with stage 3a chronic kidney disease, without long-term current use of insulin (HCC) G62.9 (ICD-10-CM) - Neuropathy R54 (ICD-10-CM) - Age-related physical debility  THERAPY DIAG:  Muscle weakness (generalized)  Unsteadiness on feet  Other abnormalities of gait and mobility  Rationale for Evaluation and Treatment:  Rehabilitation  SUBJECTIVE:                                                                                                                                                                                             SUBJECTIVE STATEMENT: She denies falls or near falls.  She verbalizes intent to continue exercising following discharge.  She is in agreement to discharge next visit. Pt accompanied by: self  PERTINENT HISTORY: DM2 w/ neuropathy, hypercholestremia, HTN, OA, CKD, PAF w/ chronic anticoagulation therapy  PAIN:  Are you having pain? Yes: NPRS scale: 3-4/10 Pain location: left knee Pain description: achy Aggravating factors: weather changes Relieving factors: Tylenol  PRECAUTIONS: Fall and Other: chronic anticoagulation therapy for PAF; osteopenia  OBJECTIVE:   DIAGNOSTIC FINDINGS: No recent relevant imaging; bone density 01/13/2022 indicates osteopenia  COGNITION: Overall cognitive status: Within functional limits for tasks assessed   SENSATION: Light touch: WFL  COORDINATION: BLE RAMS:  WFL BLE Heel-to-shin:  WFL  EDEMA:  None noted bilaterally  MUSCLE TONE: None noted in BLE  POSTURE: forward head  LOWER EXTREMITY ROM:     Active  Right Eval Left Eval  Hip flexion [name]  Hip extension    Hip abduction    Hip adduction    Hip internal rotation    Hip external rotation    Knee flexion    Knee extension " "  Ankle dorsiflexion " "  Ankle plantarflexion    Ankle inversion    Ankle eversion     (Blank rows = not tested)  LOWER EXTREMITY MMT:    MMT Right Eval Left Eval  Hip flexion 4-/5 3+/5  Hip extension    Hip abduction    Hip adduction    Hip internal rotation    Hip external rotation    Knee flexion    Knee extension 4/5 3+/5  Ankle dorsiflexion 5/5 3+/5  Ankle plantarflexion    Ankle inversion    Ankle eversion    (Blank rows = not tested)  BED MOBILITY:  Pt reports independence  TRANSFERS:  Assistive device utilized: None   Sit to stand: Complete Independence Stand to sit: Complete Independence Chair to chair: Complete Independence  GAIT: Gait pattern: step through pattern, decreased arm swing- Right, decreased arm swing- Left, decreased stride length, shuffling, and narrow BOS Distance walked: various clinic distances Assistive device utilized: None Level of assistance: SBA Comments: Left adduction causing left insole to consistently swipe right foot during swing phase.  No scanning deficits noted.  FUNCTIONAL TESTS:  30 seconds chair stand test: 7 STS w/ BUE support 10 meter walk test: 14.15 seconds no AD = 0.71 m/sec OR 2.33 ft/sec Functional gait assessment: 12/30   PATIENT SURVEYS:  ABC scale To be assessed.  TODAY'S TREATMENT:                                                                                                                              Blaze pods: Round 1-3 (0-6" heights in semi-circle pattern no UE support, each round x1 minute):  18, 29, and 26 taps, no LOB Round 4: mirror and floor taps outside BOS, 30 total Rounds 5-7 (mirror and floor pods w/ added left and right discrimination task-red = left and blue = right):  16 taps 5 errors, 17 taps 2 errors, 19 taps 1 error  Standing on BOSU BUE support progressing to anterior to posterior shifts > lateral weight shifts > holding level with upright posture progressed to no UE support  PATIENT EDUCATION: Education details: Continue HEP and walking program.  Discussed navigating crowds and going to the mall with niece and using SPC just for confidence.  She plans to go this weekend. Discussed plan for goal assessment and discharge-pt is okay with this at this time. Person educated: Patient Education method: Explanation Education comprehension: verbalized understanding  HOME EXERCISE PROGRAM: You Can Walk For A Certain Length Of Time Each Day                          Walk 10 minutes 1 times per day (4-5 days a week).              Increase 2  minutes every 7-14 days              Work up to 20 minutes (1-2 times per day).               Example:                         Day 1-2           4-5 minutes     3 times per day                         Day 7-8           10-12 minutes 2-3 times per day  Day 13-14       20-22 minutes 1-2 times per day  Access Code: [email or web address] URL: [email or web address] Date: 12/09/2022 Prepared by: Camille Bal  Exercises - Sit to Stand with Arms Crossed  - 1 x daily - 5 x weekly - 2-3 sets - 6-8 reps - Tandem Walking with Counter Support  - 1 x daily - 5 x weekly - 3 sets - 10 reps - Walking with Eyes Closed and Counter Support  - 1 x daily - 5 x weekly - 3 sets - 10 reps - Backward Walking with Counter Support  - 1 x daily - 5 x weekly - 3 sets - 10 reps - Walking with Head Rotation  - 1 x daily - 5 x weekly - 3 sets - 10 reps - Corner Balance Feet Apart: Eyes Closed With Head Turns  - 1 x daily - 5 x weekly - 3 sets - 10 reps - Romberg Stance Eyes Closed on Foam Pad  - 1 x daily - 7 x weekly - 3 sets - 10 reps - Forward Backward Monster Walk with Band at Thighs and Counter Support  - 1 x daily - 5 x weekly - 3 sets - 10 reps  GOALS: Goals reviewed with patient? Yes  SHORT TERM GOALS: Target date: 12/04/2022  Pt will be independent with strength and balance HEP to promote functional LE strength and improved balance. Baseline:  Established and independent (3/25) Goal status: MET  2.  Pt will perform >/=10 sit to stands in 30 seconds w/o UE support to demonstrate decreased risk for falls and improved functional LE strength. Baseline: 7 STS w/ BUE support; 8 STS no UE support Goal status: IN PROGRESS  3.  Pt will demonstrate a gait speed of >/=2.73 feet/sec w/o AD in order to decrease risk for falls. Baseline: 2.33 ft/sec no AD; 2.80 ft/sec no AD (3/25) Goal status: MET  4.  Pt will improve FGA score to >/=16/30 in order to demonstrate improved  balance and decreased fall risk. Baseline: 12/30; 16/30 (3/25) Goal status: MET  LONG TERM GOALS: Target date: 01/01/2023  Pt will report return to [facility] walking and regular exercise at least 1 day per week in order to progress toward baseline activity tolerance. Baseline:  Has stopped for now. Goal status: INITIAL  2.  Pt will perform >/=13 sit to stands in 30 seconds w/o UE support to demonstrate decreased risk for falls and improved functional LE strength. Baseline: 7 STS w/ BUE support Goal status: INITIAL  3.  Pt will demonstrate a gait speed of >/=3.1 feet/sec w/o AD in order to decrease risk for falls. Baseline: 2.33 ft/sec no AD Goal status: INITIAL  4.  Pt will improve FGA score to >/=20/30 in order to demonstrate improved balance and decreased fall risk. Baseline: 12/30 Goal status: INITIAL  5.  Pt will improve ABC scale score to >/=50% in order to demonstrate decreased fear of falling. Baseline: 31.875% Goal status: INITIAL  ASSESSMENT:  CLINICAL IMPRESSION: Patient tolerated session well today with improvement noted over progression of cognitive dual task challenge using blaze pods and left-right discrimination.  She has progressed well towards LTGs and increased her general tolerance to and motivation towards increased activity in her home setting.  PT will plan to discharge at following session after assessing LTGs.  Pt is in agreement at this time.  OBJECTIVE IMPAIRMENTS: Abnormal gait, decreased activity tolerance, decreased balance, decreased strength, and postural dysfunction.   ACTIVITY LIMITATIONS: bending, squatting, stairs,  and locomotion level  PARTICIPATION LIMITATIONS: community activity  PERSONAL FACTORS: Age, Fitness, Profession, and 3+ comorbidities: HTN, DM2 w/ neuropathy, PAF on chronic anticoagulation, OA, and hypercholestremia  are also affecting patient's functional outcome.   REHAB POTENTIAL: Good  CLINICAL DECISION MAKING: Evolving/moderate  complexity  EVALUATION COMPLEXITY: Moderate  PLAN:  PT FREQUENCY: 1x/week  PT DURATION: 8 weeks  PLANNED INTERVENTIONS: Therapeutic exercises, Therapeutic activity, Neuromuscular re-education, Balance training, Gait training, Patient/Family education, Self Care, Stair training, Vestibular training, DME instructions, and Re-evaluation  PLAN FOR NEXT SESSION:  Assess LTGs-D/C!   Sadie Haber, PT, DPT 12/23/2022, 5:35 PM

## 2022-12-30 ENCOUNTER — Ambulatory Visit: Payer: Medicare HMO | Admitting: Physical Therapy

## 2022-12-30 ENCOUNTER — Encounter: Payer: Self-pay | Admitting: Physical Therapy

## 2022-12-30 DIAGNOSIS — R2681 Unsteadiness on feet: Secondary | ICD-10-CM | POA: Diagnosis not present

## 2022-12-30 DIAGNOSIS — M6281 Muscle weakness (generalized): Secondary | ICD-10-CM | POA: Diagnosis not present

## 2022-12-30 DIAGNOSIS — R2689 Other abnormalities of gait and mobility: Secondary | ICD-10-CM | POA: Diagnosis not present

## 2022-12-30 NOTE — Therapy (Signed)
OUTPATIENT PHYSICAL THERAPY NEURO TREATMENT/DISCHARGE SUMMARY   Patient Name: [name] MRN: [id number] DOB:1950-10-04, 72 y.o., female Today's Date: 12/30/2022   PCP: Sharon Seller, NP REFERRING PROVIDER: Sharon Seller, NP  PHYSICAL THERAPY DISCHARGE SUMMARY  Visits from Start of Care: 9  Current functional level related to goals / functional outcomes: See clinical impression statement.   Remaining deficits: Difficulty navigating crowds   Education / Equipment: Continue HEP and walking program.  Using the cane as needed in crowds.  Progress towards goals and discharge plan for today.   Patient agrees to discharge. Patient goals were partially met. Patient is being discharged due to being pleased with the current functional level.   END OF SESSION:  PT End of Session - 12/30/22 0851     Visit Number 9    Number of Visits 9   8+eval   Date for PT Re-Evaluation 01/01/23    Authorization Type [employer]ICARE    Authorization Time Period 11/03/22 - 01/01/23    PT Start Time 0849    PT Stop Time 0915    PT Time Calculation (min) 26 min    Equipment Utilized During Treatment Gait belt    Activity Tolerance Patient tolerated treatment well    Behavior During Therapy WFL for tasks assessed/performed             Past Medical History:  Diagnosis Date   Anxiety    Arthritis    osteoarthritis   Breast cancer 2008   right, ER/PR -   CHF (congestive heart failure)    Depression    Diabetes mellitus    TYPE 2   Gastroparesis    Hx of radiation therapy 04/04/07 to 05/20/07   right breast/6260 cGy   Hypercholesterolemia    Hypertension    Lumbago    Malignant neoplasm of breast (female), unspecified site    Nonspecific elevation of levels of transaminase or lactic acid dehydrogenase (LDH)    Osteoarthrosis, unspecified whether generalized or localized, unspecified site    Regional enteritis of unspecified site    Restless legs syndrome (RLS)    TIA  (transient ischemic attack)    Urinary frequency    Uterine prolapse without mention of vaginal wall prolapse    Vitamin deficiency    Past Surgical History:  Procedure Laterality Date   ABDOMINAL HYSTERECTOMY  1997   TAH/BSO, ENDOMETRIAL SARCOMA   ARTHROPLASTY     left knee   BREAST LUMPECTOMY Right 11/17/06   re-excision 01/13/07   CATARACT EXTRACTION Right 08/2019   CATARACT EXTRACTION Left 07/2019   TOTAL KNEE ARTHROPLASTY Left 06/10/2011       TOTAL KNEE ARTHROPLASTY Right 2006, 2009   Dr Wyline Mood   TOTAL KNEE ARTHROPLASTY Left 2008   Dr Wyline Mood   WRIST SURGERY     carpel tunnel   Patient Active Problem List   Diagnosis Date Noted   History of sarcoma 05/27/2020   Pneumonia due to COVID-19 virus 09/15/2019   AF (paroxysmal atrial fibrillation) 09/15/2019   Chronic anticoagulation 09/15/2019   Anemia, chronic disease 10/15/2017   Type 2 diabetes mellitus without complication, without long-term current use of insulin 10/15/2017   Hyperlipidemia associated with type 2 diabetes mellitus 10/15/2017   Claustrophobia 07/05/2015   Bilateral edema of lower extremity 09/11/2014   CKD stage 3 due to type 2 diabetes mellitus 03/07/2014   Restless leg syndrome 11/29/2013   Other and unspecified hyperlipidemia 11/29/2013   Osteoarthritis 11/29/2013   CKD (chronic kidney  disease) 08/16/2013   Insomnia 08/16/2013   Atrial flutter- not confirmed on ECG review 12/15/2012   Loss of weight 12/15/2012   Gastroparesis 12/14/2012   Renal insufficiency 12/14/2012   History of malignant neoplasm of right breast 01/01/2012   Arthritis    Hx of radiation therapy    OSTEOARTHRITIS, HANDS, BILATERAL 05/29/2009   CHEST PAIN, ATYPICAL 02/09/2008   LOW BACK PAIN, ACUTE 01/03/2007   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) 11/04/2006   HYPERCHOLESTEROLEMIA 11/04/2006   HYPERTENSION, BENIGN SYSTEMIC 11/04/2006   Cardiomyopathy-recovered 11/04/2006   MENOPAUSAL  SYNDROME 11/04/2006   OSTEOARTHRITIS, LOWER LEG 11/04/2006   GAS/BLOATING 11/04/2006    ONSET DATE: 10/12/2022 (starting feeling off balance and weak)  REFERRING DIAG: X91.47,W29.56 (ICD-10-CM) - Type 2 diabetes mellitus with stage 3a chronic kidney disease, without long-term current use of insulin (HCC) G62.9 (ICD-10-CM) - Neuropathy R54 (ICD-10-CM) - Age-related physical debility  THERAPY DIAG:  Muscle weakness (generalized)  Unsteadiness on feet  Other abnormalities of gait and mobility  Rationale for Evaluation and Treatment: Rehabilitation  SUBJECTIVE:                                                                                                                                                                                             SUBJECTIVE STATEMENT: She denies falls or near falls.  She did not get to be as active as she would have liked this weekend due to the rain, but is going to walk at the [employer] today.   Pt accompanied by: self  PERTINENT HISTORY: DM2 w/ neuropathy, hypercholestremia, HTN, OA, CKD, PAF w/ chronic anticoagulation therapy  PAIN:  Are you having pain? No  PRECAUTIONS: Fall and Other: chronic anticoagulation therapy for PAF; osteopenia  OBJECTIVE:   DIAGNOSTIC FINDINGS: No recent relevant imaging; bone density 01/13/2022 indicates osteopenia  COGNITION: Overall cognitive status: Within functional limits for tasks assessed   SENSATION: Light touch: WFL  COORDINATION: BLE RAMS:  WFL BLE Heel-to-shin:  WFL  EDEMA:  None noted bilaterally  MUSCLE TONE: None noted in BLE  POSTURE: forward head  LOWER EXTREMITY ROM:     Active  Right Eval Left Eval  Hip flexion [facility]  Hip extension    Hip abduction    Hip adduction    Hip internal rotation    Hip external rotation    Knee flexion    Knee extension " "  Ankle dorsiflexion " "  Ankle plantarflexion    Ankle inversion    Ankle eversion     (Blank rows = not  tested)  LOWER EXTREMITY MMT:  MMT Right Eval Left Eval  Hip flexion 4-/5 3+/5  Hip extension    Hip abduction    Hip adduction    Hip internal rotation    Hip external rotation    Knee flexion    Knee extension 4/5 3+/5  Ankle dorsiflexion 5/5 3+/5  Ankle plantarflexion    Ankle inversion    Ankle eversion    (Blank rows = not tested)  BED MOBILITY:  Pt reports independence  TRANSFERS: Assistive device utilized: None  Sit to stand: Complete Independence Stand to sit: Complete Independence Chair to chair: Complete Independence  GAIT: Gait pattern: step through pattern, decreased arm swing- Right, decreased arm swing- Left, decreased stride length, shuffling, and narrow BOS Distance walked: various clinic distances Assistive device utilized: None Level of assistance: SBA Comments: Left adduction causing left insole to consistently swipe right foot during swing phase.  No scanning deficits noted.  FUNCTIONAL TESTS:  30 seconds chair stand test: 7 STS w/ BUE support 10 meter walk test: 14.15 seconds no AD = 0.71 m/sec OR 2.33 ft/sec Functional gait assessment: 12/30  [facility] PT Assessment - 12/30/22 0900       Functional Gait  Assessment   Gait assessed  Yes    Gait Level Surface Walks 20 ft in less than 5.5 sec, no assistive devices, good speed, no evidence for imbalance, normal gait pattern, deviates no more than 6 in outside of the 12 in walkway width.    Change in Gait Speed Able to smoothly change walking speed without loss of balance or gait deviation. Deviate no more than 6 in outside of the 12 in walkway width.    Gait with Horizontal Head Turns Performs head turns smoothly with no change in gait. Deviates no more than 6 in outside 12 in walkway width    Gait with Vertical Head Turns Performs head turns with no change in gait. Deviates no more than 6 in outside 12 in walkway width.    Gait and Pivot Turn Pivot turns safely in greater than 3 sec and stops with no  loss of balance, or pivot turns safely within 3 sec and stops with mild imbalance, requires small steps to catch balance.    Step Over Obstacle Is able to step over one shoe box (4.5 in total height) without changing gait speed. No evidence of imbalance.    Gait with Narrow Base of Support Ambulates 4-7 steps.    Gait with Eyes Closed Walks 20 ft, slow speed, abnormal gait pattern, evidence for imbalance, deviates 10-15 in outside 12 in walkway width. Requires more than 9 sec to ambulate 20 ft.    Ambulating Backwards Walks 20 ft, uses assistive device, slower speed, mild gait deviations, deviates 6-10 in outside 12 in walkway width.    Steps Alternating feet, must use rail.    Total Score 22    FGA comment: 22/30 = moderate fall risk             PATIENT SURVEYS:  ABC scale To be assessed.  TODAY'S TREATMENT:  Verbally reviewed HEP, pt does not need reprint. Patient is [facility] x4 days around therapy day w/ [facility]  Plans to return to pool activity at [facility] w/ family. 30 second chair stand:  8 STS no UE support no AD:  10.06 seconds = 0.99 m/sec OR 3.28 ft/sec FGA:  [facility] PT Assessment - 12/30/22 0900       Functional Gait  Assessment   Gait assessed  Yes    Gait Level Surface Walks 20 ft in less than 5.5 sec, no assistive devices, good speed, no evidence for imbalance, normal gait pattern, deviates no more than 6 in outside of the 12 in walkway width.    Change in Gait Speed Able to smoothly change walking speed without loss of balance or gait deviation. Deviate no more than 6 in outside of the 12 in walkway width.    Gait with Horizontal Head Turns Performs head turns smoothly with no change in gait. Deviates no more than 6 in outside 12 in walkway width    Gait with Vertical Head Turns Performs head turns  with no change in gait. Deviates no more than 6 in outside 12 in walkway width.    Gait and Pivot Turn Pivot turns safely in greater than 3 sec and stops with no loss of balance, or pivot turns safely within 3 sec and stops with mild imbalance, requires small steps to catch balance.    Step Over Obstacle Is able to step over one shoe box (4.5 in total height) without changing gait speed. No evidence of imbalance.    Gait with Narrow Base of Support Ambulates 4-7 steps.    Gait with Eyes Closed Walks 20 ft, slow speed, abnormal gait pattern, evidence for imbalance, deviates 10-15 in outside 12 in walkway width. Requires more than 9 sec to ambulate 20 ft.    Ambulating Backwards Walks 20 ft, uses assistive device, slower speed, mild gait deviations, deviates 6-10 in outside 12 in walkway width.    Steps Alternating feet, must use rail.    Total Score 22    FGA comment: 22/30 = moderate fall risk            ABC Scale:  55%  PATIENT EDUCATION: Education details: Continue HEP and walking program.  Using the cane as needed in crowds.  Progress towards goals and discharge plan for today. Person educated: Patient Education method: Explanation Education comprehension: verbalized understanding  HOME EXERCISE PROGRAM: You Can Walk For A Certain Length Of Time Each Day                          Walk 10 minutes 1 times per day (4-5 days a week).             Increase 2  minutes every 7-14 days              Work up to 20 minutes (1-2 times per day).               Example:                         Day 1-2           4-5 minutes     3 times per day  Day 7-8           10-12 minutes 2-3 times per day                         Day 13-14       20-22 minutes 1-2 times per day  Access Code: [email or web address] URL: [email or web address] Date: 12/09/2022 Prepared by: Camille Bal  Exercises - Sit to Stand with Arms Crossed  - 1 x daily - 5 x weekly - 2-3 sets - 6-8 reps -  Tandem Walking with Counter Support  - 1 x daily - 5 x weekly - 3 sets - 10 reps - Walking with Eyes Closed and Counter Support  - 1 x daily - 5 x weekly - 3 sets - 10 reps - Backward Walking with Counter Support  - 1 x daily - 5 x weekly - 3 sets - 10 reps - Walking with Head Rotation  - 1 x daily - 5 x weekly - 3 sets - 10 reps - Corner Balance Feet Apart: Eyes Closed With Head Turns  - 1 x daily - 5 x weekly - 3 sets - 10 reps - Romberg Stance Eyes Closed on Foam Pad  - 1 x daily - 7 x weekly - 3 sets - 10 reps - Forward Backward Monster Walk with Band at Thighs and Counter Support  - 1 x daily - 5 x weekly - 3 sets - 10 reps  GOALS: Goals reviewed with patient? Yes  SHORT TERM GOALS: Target date: 12/04/2022  Pt will be independent with strength and balance HEP to promote functional LE strength and improved balance. Baseline:  Established and independent (3/25) Goal status: MET  2.  Pt will perform >/=10 sit to stands in 30 seconds w/o UE support to demonstrate decreased risk for falls and improved functional LE strength. Baseline: 7 STS w/ BUE support; 8 STS no UE support Goal status: IN PROGRESS  3.  Pt will demonstrate a gait speed of >/=2.73 feet/sec w/o AD in order to decrease risk for falls. Baseline: 2.33 ft/sec no AD; 2.80 ft/sec no AD (3/25) Goal status: MET  4.  Pt will improve FGA score to >/=16/30 in order to demonstrate improved balance and decreased fall risk. Baseline: 12/30; 16/30 (3/25) Goal status: MET  LONG TERM GOALS: Target date: 01/01/2023  Pt will report return to [facility] walking and regular exercise at least 1 day per week in order to progress toward baseline activity tolerance. Baseline:  4 days per week (4/24) Goal status: MET  2.  Pt will perform >/=13 sit to stands in 30 seconds w/o UE support to demonstrate decreased risk for falls and improved functional LE strength. Baseline: 7 STS w/ BUE support; 8 STS w/o BUE support (4/24) Goal status: PARTIALLY  MET  3.  Pt will demonstrate a gait speed of >/=3.1 feet/sec w/o AD in order to decrease risk for falls. Baseline: 2.33 ft/sec no AD; 3.28 ft/sec (4/24) Goal status: MET  4.  Pt will improve FGA score to >/=20/30 in order to demonstrate improved balance and decreased fall risk. Baseline: 12/30; 22/30 (4/24) Goal status: MET  5.  Pt will improve ABC scale score to >/=50% in order to demonstrate decreased fear of falling. Baseline: 31.875%;  55% (4/24) Goal status: MET  ASSESSMENT:  CLINICAL IMPRESSION: Patient has made great progress towards her goals with gradual return to normal activity including regular walking, resistance training, and eventual aquatic work at [employer].  She performs 30 second chair stand completing 8 stands without UE support.  Her walking speed has greatly improved to 3.28 ft/sec with improved fluidity and general mechanics.  She is in a lower fall risk category scoring 22/30 on the FGA.  Her ABC scale score improved from just under 32% to 55% indicating decreased fear of falling.  At this time she is appropriate for and in agreement to discharge.  OBJECTIVE IMPAIRMENTS: Abnormal gait, decreased activity tolerance, decreased balance, decreased strength, and postural dysfunction.   ACTIVITY LIMITATIONS: bending, squatting, stairs, and locomotion level  PARTICIPATION LIMITATIONS: community activity  PERSONAL FACTORS: Age, Fitness, Profession, and 3+ comorbidities: HTN, DM2 w/ neuropathy, PAF on chronic anticoagulation, OA, and hypercholestremia  are also affecting patient's functional outcome.   REHAB POTENTIAL: Good  CLINICAL DECISION MAKING: Evolving/moderate complexity  EVALUATION COMPLEXITY: Moderate  PLAN:  PT FREQUENCY: 1x/week  PT DURATION: 8 weeks  PLANNED INTERVENTIONS: Therapeutic exercises, Therapeutic activity, Neuromuscular re-education, Balance training, Gait training, Patient/Family education, Self Care, Stair training, Vestibular training,  DME instructions, and Re-evaluation  PLAN FOR NEXT SESSION:  N/A   Sadie Haber, PT, DPT 12/30/2022, 10:04 AM

## 2023-01-02 ENCOUNTER — Other Ambulatory Visit: Payer: Self-pay | Admitting: Nurse Practitioner

## 2023-01-02 DIAGNOSIS — E1122 Type 2 diabetes mellitus with diabetic chronic kidney disease: Secondary | ICD-10-CM

## 2023-01-08 ENCOUNTER — Encounter: Payer: Self-pay | Admitting: Nurse Practitioner

## 2023-01-08 ENCOUNTER — Ambulatory Visit (INDEPENDENT_AMBULATORY_CARE_PROVIDER_SITE_OTHER): Payer: Medicare HMO | Admitting: Nurse Practitioner

## 2023-01-08 VITALS — BP 148/80 | HR 68 | Temp 97.6°F | Resp 16 | Ht 66.5 in | Wt 197.6 lb

## 2023-01-08 DIAGNOSIS — N1831 Chronic kidney disease, stage 3a: Secondary | ICD-10-CM | POA: Diagnosis not present

## 2023-01-08 DIAGNOSIS — E1122 Type 2 diabetes mellitus with diabetic chronic kidney disease: Secondary | ICD-10-CM | POA: Diagnosis not present

## 2023-01-08 DIAGNOSIS — I10 Essential (primary) hypertension: Secondary | ICD-10-CM | POA: Diagnosis not present

## 2023-01-08 DIAGNOSIS — Z7189 Other specified counseling: Secondary | ICD-10-CM | POA: Diagnosis not present

## 2023-01-08 NOTE — Patient Instructions (Signed)
Increase metformin 500 mg in am and add 500 mg with supper for 1 week Then increase to 1000 mg in am (breakfast) and 500 mg with supper for the next week Then increase to 1000 mg with breakfast and 1000 mg with supper from then on  NOTIFY for diarrhea

## 2023-01-08 NOTE — Progress Notes (Signed)
9   Careteam: Patient Care Team: Sharon Seller, NP as PCP - General (Geriatric Medicine) Duke Salvia, MD as PCP - Electrophysiology (Cardiology) Theda Belfast, OD (Optometry)  PLACE OF SERVICE:  [facility]  Advanced Directive information    Allergies  Allergen Reactions   Codeine Other (See Comments)   Hydrocodone Nausea And Vomiting   Lisinopril     Sick on the stomach    Chief Complaint  Patient presents with   Medical Management of Chronic Issues    Discuss covid shot, defer if pt declines     HPI: Patient is a 72 y.o. female here for 3 week follow up on diabetes.  Reports blood sugars are still elevated.  Not eating after 5 pm. Fasting blood sugars 270-300s Eating greens and not eating fried foods.  Overall feels very well.  No blurred vision, no numbness or tingling in feet No diarrhea.  Spent a lot of money on [place] and requesting another medication not be called in that is costly.    Review of Systems:  Review of Systems  Constitutional:  Negative for chills, fever and weight loss.  HENT:  Negative for tinnitus.   Respiratory:  Negative for cough, sputum production and shortness of breath.   Cardiovascular:  Negative for chest pain, palpitations and leg swelling.  Gastrointestinal:  Negative for abdominal pain, constipation, diarrhea and heartburn.  Genitourinary:  Negative for dysuria, frequency and urgency.  Musculoskeletal:  Negative for back pain, falls, joint pain and myalgias.  Skin: Negative.   Neurological:  Negative for dizziness and headaches.  Psychiatric/Behavioral:  Negative for depression and memory loss. The patient does not have insomnia.     Past Medical History:  Diagnosis Date   Anxiety    Arthritis    osteoarthritis   Breast cancer (HCC) 2008   right, ER/PR -   CHF (congestive heart failure) (HCC)    Depression    Diabetes mellitus    TYPE 2   Gastroparesis    Hx of radiation therapy 04/04/07 to 05/20/07   right  breast/6260 cGy   Hypercholesterolemia    Hypertension    Lumbago    Malignant neoplasm of breast (female), unspecified site    Nonspecific elevation of levels of transaminase or lactic acid dehydrogenase (LDH)    Osteoarthrosis, unspecified whether generalized or localized, unspecified site    Regional enteritis of unspecified site    Restless legs syndrome (RLS)    TIA (transient ischemic attack)    Urinary frequency    Uterine prolapse without mention of vaginal wall prolapse    Vitamin deficiency    Past Surgical History:  Procedure Laterality Date   ABDOMINAL HYSTERECTOMY  1997   TAH/BSO, ENDOMETRIAL SARCOMA   ARTHROPLASTY     left knee   BREAST LUMPECTOMY Right 11/17/06   re-excision 01/13/07   CATARACT EXTRACTION Right 08/2019   CATARACT EXTRACTION Left 07/2019   TOTAL KNEE ARTHROPLASTY Left 06/10/2011       TOTAL KNEE ARTHROPLASTY Right 2006, 2009   Dr Wyline Mood   TOTAL KNEE ARTHROPLASTY Left 2008   Dr Wyline Mood   WRIST SURGERY     carpel tunnel   Social History:   reports that she quit smoking about 31 years ago. Her smoking use included cigarettes. She has a 10.00 pack-year smoking history. She has never used smokeless tobacco. She reports that she does not drink alcohol and does not use drugs.  Family History  Problem Relation Age of Onset  Diabetes Mother    Diabetes Father    Aneurysm Sister    Arthritis Sister    Diabetes Brother    Heart Problems Brother    Colon cancer Neg Hx     Medications: Patient's Medications  New Prescriptions   No medications on file  Previous Medications   ACCU-CHEK FASTCLIX LANCETS MISC    Check blood sugar once daily as directed E11.59   ACETAMINOPHEN (TYLENOL) 500 MG TABLET    Take 1,000 mg by mouth daily.   ALCOHOL SWABS (B-D SINGLE USE SWABS BUTTERFLY) PADS    1 Swab by Does not apply route as needed.   AMLODIPINE (NORVASC) 10 MG TABLET    TAKE 1/2 TABLET EVERY DAY   ASCORBIC ACID (VITAMIN C) 500 MG TABLET    Take 1 tablet  by mouth daily.   BLOOD GLUCOSE MONITORING SUPPL (ACCU-CHEK GUIDE ME) W/DEVICE KIT    1 each by Does not apply route as needed.   CARVEDILOL (COREG) 25 MG TABLET    TAKE 1 TABLET TWICE DAILY WITH MEALS   ELIQUIS 5 MG TABS TABLET    TAKE 1 TABLET BY MOUTH TWICE A DAY   EMPAGLIFLOZIN (JARDIANCE) 10 MG TABS TABLET    TAKE 1 TABLET BY MOUTH DAILY BEFORE BREAKFAST.   FERROUS SULFATE (IRON) 325 (65 FE) MG TABS    Take by mouth daily.   GLUCOSE BLOOD (ACCU-CHEK GUIDE) TEST STRIP    Use as instructed   LOSARTAN (COZAAR) 100 MG TABLET    TAKE 1 TABLET EVERY DAY FOR BLOOD PRESSURE   MENTHOL, TOPICAL ANALGESIC, (BIOFREEZE) 4 % GEL    Apply 3 oz topically 3 (three) times daily as needed. Apply to both knees for pain   METFORMIN (GLUCOPHAGE) 500 MG TABLET    Take 1 tablet (500 mg total) by mouth daily with breakfast.   POLYETHYLENE GLYCOL (MIRALAX / GLYCOLAX) 17 G PACKET    Take 17 g by mouth daily.   ROSUVASTATIN (CRESTOR) 40 MG TABLET    TAKE 1 TABLET EVERY DAY   TRIAMCINOLONE OINTMENT (KENALOG) 0.1 %    as needed.   VIBEGRON (GEMTESA) 75 MG TABS    Take 1 tablet by mouth daily.  Modified Medications   No medications on file  Discontinued Medications   No medications on file    Physical Exam:  Vitals:   01/08/23 0820 01/08/23 0823  BP: (!) 148/78 (!) 148/80  Pulse: 68   Resp: 16   Temp: 97.6 F (36.4 C)   TempSrc: Temporal   SpO2: 97%   Weight: 197 lb 9.6 oz (89.6 kg)   Height: 5' 6.5" (1.689 m)    Body mass index is 31.42 kg/m. Wt Readings from Last 3 Encounters:  01/08/23 197 lb 9.6 oz (89.6 kg)  12/07/22 200 lb (90.7 kg)  10/30/22 200 lb 6.4 oz (90.9 kg)    Physical Exam Constitutional:      General: She is not in acute distress.    Appearance: She is well-developed. She is not diaphoretic.  HENT:     Head: Normocephalic and atraumatic.     Mouth/Throat:     Pharynx: No oropharyngeal exudate.  Eyes:     Conjunctiva/sclera: Conjunctivae normal.     Pupils: Pupils are equal,  round, and reactive to light.  Cardiovascular:     Rate and Rhythm: Normal rate and regular rhythm.     Heart sounds: Normal heart sounds.  Pulmonary:     Effort: Pulmonary effort is  normal.     Breath sounds: Normal breath sounds.  Abdominal:     General: Bowel sounds are normal.     Palpations: Abdomen is soft.  Musculoskeletal:     Cervical back: Normal range of motion and neck supple.     Right lower leg: No edema.     Left lower leg: No edema.  Skin:    General: Skin is warm and dry.  Neurological:     Mental Status: She is alert.  Psychiatric:        Mood and Affect: Mood normal.     Labs reviewed: Basic Metabolic Panel: Recent Labs    04/24/22 0945 10/30/22 0915  NA 139 142  K 4.5 3.5  CL 105 106  CO2 24 26  GLUCOSE 90 130*  BUN 31* 24  CREATININE 1.55* 1.42*  CALCIUM 9.3 9.9  TSH  --  0.83   Liver Function Tests: Recent Labs    04/24/22 0945 10/30/22 0915  AST 15 16  ALT 9 11  BILITOT 0.6 0.4  PROT 6.7 7.7   No results for input(s): "LIPASE", "AMYLASE" in the last 8760 hours. No results for input(s): "AMMONIA" in the last 8760 hours. CBC: Recent Labs    04/24/22 0945 10/30/22 0915  WBC 4.7 4.4  NEUTROABS 2,942 2,662  HGB 11.3* 11.7  HCT 33.1* 35.2  MCV 94.8 91.7  PLT 176 209   Lipid Panel: No results for input(s): "CHOL", "HDL", "LDLCALC", "TRIG", "CHOLHDL", "LDLDIRECT" in the last 8760 hours. TSH: Recent Labs    10/30/22 0915  TSH 0.83   A1C: Lab Results  Component Value Date   HGBA1C 7.4 (H) 10/30/2022     Assessment/Plan 1. Advanced directives, counseling/discussion -Most form completed with patient  - Do not attempt resuscitation (DNR)  2. Type 2 diabetes mellitus with stage 3a chronic kidney disease, without long-term current use of insulin (HCC) -Encouraged to continue dietary compliance, routine foot care/monitoring and to keep up with diabetic eye exams through ophthalmology  -will max our her metformin at this time-  titration to get to 1000 mg BID, if she has any diarrhea or adverse effects will notify us.  Will continue on jardiance for now but likely will need to DC due to cost.  -she would be a good candidate for a SGL2  3. HYPERTENSION, BENIGN SYSTEMIC Elevated today, goal <140/80 If still remains elevated at next visit will need to change medications.   Follow up on 02/08/2023 as scheduled.   Janene Harvey. Biagio Borg [facility] [phone]

## 2023-01-13 DIAGNOSIS — C50911 Malignant neoplasm of unspecified site of right female breast: Secondary | ICD-10-CM | POA: Diagnosis not present

## 2023-01-25 ENCOUNTER — Ambulatory Visit: Payer: Medicare HMO | Admitting: Nurse Practitioner

## 2023-01-29 ENCOUNTER — Ambulatory Visit: Payer: Medicare HMO | Admitting: Nurse Practitioner

## 2023-02-05 DIAGNOSIS — Z01411 Encounter for gynecological examination (general) (routine) with abnormal findings: Secondary | ICD-10-CM | POA: Diagnosis not present

## 2023-02-05 DIAGNOSIS — N8110 Cystocele, unspecified: Secondary | ICD-10-CM | POA: Diagnosis not present

## 2023-02-05 DIAGNOSIS — L293 Anogenital pruritus, unspecified: Secondary | ICD-10-CM | POA: Diagnosis not present

## 2023-02-08 ENCOUNTER — Encounter: Payer: Self-pay | Admitting: Nurse Practitioner

## 2023-02-08 ENCOUNTER — Ambulatory Visit (INDEPENDENT_AMBULATORY_CARE_PROVIDER_SITE_OTHER): Payer: Medicare HMO | Admitting: Nurse Practitioner

## 2023-02-08 VITALS — BP 156/80 | HR 72 | Temp 96.9°F | Resp 16 | Ht 66.5 in | Wt 197.2 lb

## 2023-02-08 DIAGNOSIS — E1122 Type 2 diabetes mellitus with diabetic chronic kidney disease: Secondary | ICD-10-CM | POA: Diagnosis not present

## 2023-02-08 DIAGNOSIS — I429 Cardiomyopathy, unspecified: Secondary | ICD-10-CM | POA: Diagnosis not present

## 2023-02-08 DIAGNOSIS — D6869 Other thrombophilia: Secondary | ICD-10-CM

## 2023-02-08 DIAGNOSIS — N1831 Chronic kidney disease, stage 3a: Secondary | ICD-10-CM

## 2023-02-08 DIAGNOSIS — I10 Essential (primary) hypertension: Secondary | ICD-10-CM | POA: Diagnosis not present

## 2023-02-08 DIAGNOSIS — E7800 Pure hypercholesterolemia, unspecified: Secondary | ICD-10-CM

## 2023-02-08 DIAGNOSIS — G629 Polyneuropathy, unspecified: Secondary | ICD-10-CM | POA: Diagnosis not present

## 2023-02-08 DIAGNOSIS — I480 Paroxysmal atrial fibrillation: Secondary | ICD-10-CM | POA: Diagnosis not present

## 2023-02-08 DIAGNOSIS — N3281 Overactive bladder: Secondary | ICD-10-CM

## 2023-02-08 MED ORDER — AMLODIPINE BESYLATE 10 MG PO TABS: 10.0000 mg | ORAL_TABLET | Freq: Every day | ORAL | 1 refills | Status: DC

## 2023-02-08 MED ORDER — SITAGLIPTIN PHOSPHATE 100 MG PO TABS: 100.0000 mg | ORAL_TABLET | Freq: Every day | ORAL | 3 refills | Status: DC

## 2023-02-08 NOTE — Progress Notes (Signed)
Careteam: Patient Care Team: Sharon Seller, NP as PCP - General (Geriatric Medicine) Duke Salvia, MD as PCP - Electrophysiology (Cardiology) Theda Belfast, OD (Optometry)  PLACE OF SERVICE:  [facility]  Advanced Directive information Does Patient Have a Medical Advance Directive?: Yes, Type of Advance Directive: Out of facility DNR (pink MOST or yellow form), Does patient want to make changes to medical advance directive?: No - Patient declined  Allergies  Allergen Reactions   Codeine Other (See Comments)   Hydrocodone Nausea And Vomiting   Lisinopril     Sick on the stomach    Chief Complaint  Patient presents with   Medical Management of Chronic Issues    3 month follow up.    Health Maintenance    Discuss the need for Hepatitis C Screening.    Immunizations    Discuss the need for Covid Booster   Concern     Patient complains of bump on left side of face.      HPI: Patient is a 72 y.o. female for routine follow up.  DM- metformin works but feels like maybe she has taken too long She is taking 500 mg BID. Fasting from 142-251. Was doing better and went up to 1000 mg in the am and then 500 in the evening but then had too much diarrhea.  Has been trying to take metamucil but then got constipated.  She is taking jardiance, has enough to last 2 more months but too expensive to continue.  No hypoglycemia.   HTN- 140/70-60s  Going to the gym 3 days a week.   She has a mole on the side of her face that will become swollen- not having any issues with this today.  Review of Systems:  Review of Systems  Constitutional:  Negative for chills, fever and weight loss.  HENT:  Negative for tinnitus.   Respiratory:  Negative for cough, sputum production and shortness of breath.   Cardiovascular:  Negative for chest pain, palpitations and leg swelling.  Gastrointestinal:  Negative for abdominal pain, constipation, diarrhea and heartburn.  Genitourinary:  Negative  for dysuria, frequency and urgency.  Musculoskeletal:  Negative for back pain, falls, joint pain and myalgias.  Skin: Negative.   Neurological:  Negative for dizziness and headaches.  Psychiatric/Behavioral:  Negative for depression and memory loss. The patient does not have insomnia.     Past Medical History:  Diagnosis Date   Anxiety    Arthritis    osteoarthritis   Breast cancer (HCC) 2008   right, ER/PR -   CHF (congestive heart failure) (HCC)    Depression    Diabetes mellitus    TYPE 2   Gastroparesis    Hx of radiation therapy 04/04/07 to 05/20/07   right breast/6260 cGy   Hypercholesterolemia    Hypertension    Lumbago    Malignant neoplasm of breast (female), unspecified site    Nonspecific elevation of levels of transaminase or lactic acid dehydrogenase (LDH)    Osteoarthrosis, unspecified whether generalized or localized, unspecified site    Regional enteritis of unspecified site    Restless legs syndrome (RLS)    TIA (transient ischemic attack)    Urinary frequency    Uterine prolapse without mention of vaginal wall prolapse    Vitamin deficiency    Past Surgical History:  Procedure Laterality Date   ABDOMINAL HYSTERECTOMY  1997   TAH/BSO, ENDOMETRIAL SARCOMA   ARTHROPLASTY     left knee  BREAST LUMPECTOMY Right 11/17/06   re-excision 01/13/07   CATARACT EXTRACTION Right 08/2019   CATARACT EXTRACTION Left 07/2019   TOTAL KNEE ARTHROPLASTY Left 06/10/2011       TOTAL KNEE ARTHROPLASTY Right 2006, 2009   Dr Wyline Mood   TOTAL KNEE ARTHROPLASTY Left 2008   Dr Wyline Mood   WRIST SURGERY     carpel tunnel   Social History:   reports that she quit smoking about 31 years ago. Her smoking use included cigarettes. She has a 10.00 pack-year smoking history. She has never used smokeless tobacco. She reports that she does not drink alcohol and does not use drugs.  Family History  Problem Relation Age of Onset   Diabetes Mother    Diabetes Father    Aneurysm Sister     Arthritis Sister    Diabetes Brother    Heart Problems Brother    Colon cancer Neg Hx     Medications: Patient's Medications  New Prescriptions   No medications on file  Previous Medications   ACCU-CHEK FASTCLIX LANCETS MISC    Check blood sugar once daily as directed E11.59   ACETAMINOPHEN (TYLENOL) 500 MG TABLET    Take 1,000 mg by mouth daily.   ALCOHOL SWABS (B-D SINGLE USE SWABS BUTTERFLY) PADS    1 Swab by Does not apply route as needed.   AMLODIPINE (NORVASC) 10 MG TABLET    TAKE 1/2 TABLET EVERY DAY   ASCORBIC ACID (VITAMIN C) 500 MG TABLET    Take 1 tablet by mouth daily.   BLOOD GLUCOSE MONITORING SUPPL (ACCU-CHEK GUIDE ME) W/DEVICE KIT    1 each by Does not apply route as needed.   CARVEDILOL (COREG) 25 MG TABLET    TAKE 1 TABLET TWICE DAILY WITH MEALS   ELIQUIS 5 MG TABS TABLET    TAKE 1 TABLET BY MOUTH TWICE A DAY   EMPAGLIFLOZIN (JARDIANCE) 10 MG TABS TABLET    TAKE 1 TABLET BY MOUTH DAILY BEFORE BREAKFAST.   FERROUS SULFATE (IRON) 325 (65 FE) MG TABS    Take by mouth daily.   GLUCOSE BLOOD (ACCU-CHEK GUIDE) TEST STRIP    Use as instructed   LOSARTAN (COZAAR) 100 MG TABLET    TAKE 1 TABLET EVERY DAY FOR BLOOD PRESSURE   MENTHOL, TOPICAL ANALGESIC, (BIOFREEZE) 4 % GEL    Apply 3 oz topically 3 (three) times daily as needed. Apply to both knees for pain   METFORMIN (GLUCOPHAGE) 500 MG TABLET    Take 1 tablet (500 mg total) by mouth daily with breakfast.   POLYETHYLENE GLYCOL (MIRALAX / GLYCOLAX) 17 G PACKET    Take 17 g by mouth daily.   ROSUVASTATIN (CRESTOR) 40 MG TABLET    TAKE 1 TABLET EVERY DAY   TRIAMCINOLONE OINTMENT (KENALOG) 0.1 %    as needed.   VIBEGRON (GEMTESA) 75 MG TABS    Take 1 tablet by mouth daily.  Modified Medications   No medications on file  Discontinued Medications   No medications on file    Physical Exam:  Vitals:   02/08/23 0819 02/08/23 0823  BP: (!) 158/78 (!) 156/80  Pulse: 72   Resp: 16   Temp: (!) 96.9 F (36.1 C)   SpO2: 94%    Weight: 197 lb 3.2 oz (89.4 kg)   Height: 5' 6.5" (1.689 m)    Body mass index is 31.35 kg/m. Wt Readings from Last 3 Encounters:  02/08/23 197 lb 3.2 oz (89.4 kg)  01/08/23 197  lb 9.6 oz (89.6 kg)  12/07/22 200 lb (90.7 kg)    Physical Exam Constitutional:      General: She is not in acute distress.    Appearance: She is well-developed. She is not diaphoretic.  HENT:     Head: Normocephalic and atraumatic.     Mouth/Throat:     Pharynx: No oropharyngeal exudate.  Eyes:     Conjunctiva/sclera: Conjunctivae normal.     Pupils: Pupils are equal, round, and reactive to light.  Cardiovascular:     Rate and Rhythm: Normal rate and regular rhythm.     Heart sounds: Normal heart sounds.  Pulmonary:     Effort: Pulmonary effort is normal.     Breath sounds: Normal breath sounds.  Abdominal:     General: Bowel sounds are normal.     Palpations: Abdomen is soft.  Musculoskeletal:     Cervical back: Normal range of motion and neck supple.     Right lower leg: No edema.     Left lower leg: No edema.  Skin:    General: Skin is warm and dry.  Neurological:     Mental Status: She is alert.  Psychiatric:        Mood and Affect: Mood normal.     Labs reviewed: Basic Metabolic Panel: Recent Labs    04/24/22 0945 10/30/22 0915  NA 139 142  K 4.5 3.5  CL 105 106  CO2 24 26  GLUCOSE 90 130*  BUN 31* 24  CREATININE 1.55* 1.42*  CALCIUM 9.3 9.9  TSH  --  0.83   Liver Function Tests: Recent Labs    04/24/22 0945 10/30/22 0915  AST 15 16  ALT 9 11  BILITOT 0.6 0.4  PROT 6.7 7.7   No results for input(s): "LIPASE", "AMYLASE" in the last 8760 hours. No results for input(s): "AMMONIA" in the last 8760 hours. CBC: Recent Labs    04/24/22 0945 10/30/22 0915  WBC 4.7 4.4  NEUTROABS 2,942 2,662  HGB 11.3* 11.7  HCT 33.1* 35.2  MCV 94.8 91.7  PLT 176 209   Lipid Panel: No results for input(s): "CHOL", "HDL", "LDLCALC", "TRIG", "CHOLHDL", "LDLDIRECT" in the last  8760 hours. TSH: Recent Labs    10/30/22 0915  TSH 0.83   A1C: Lab Results  Component Value Date   HGBA1C 7.4 (H) 10/30/2022     Assessment/Plan 1. Type 2 diabetes mellitus with stage 3a chronic kidney disease, without long-term current use of insulin (HCC) -Encouraged dietary compliance, routine foot care/monitoring and to keep up with diabetic eye exams through ophthalmology  Will add januvia to regimen - Hemoglobin A1c - sitaGLIPtin (JANUVIA) 100 MG tablet; Take 1 tablet (100 mg total) by mouth daily.  Dispense: 30 tablet; Refill: 3  2. Essential hypertension, benign -Blood pressure well controlled, goal bp <140/90 Continue current medications and dietary modifications follow metabolic panel Will increase norvasc to 10 mg daily  - COMPLETE METABOLIC PANEL WITH GFR - CBC with Differential/Platelet - amLODipine (NORVASC) 10 MG tablet; Take 1 tablet (10 mg total) by mouth daily Dispense: 90 tablet; Refill: 1  3. Neuropathy Stable on current regimen  4. HYPERCHOLESTEROLEMIA Continues on crestor.  - Lipid panel - COMPLETE METABOLIC PANEL WITH GFR  5. Cardiomyopathy, unspecified type (HCC) Stable, continues on coreg, euvolemic at this time  6. AF (paroxysmal atrial fibrillation) (HCC) -rate controlled on coreg  7. Overactive bladder Stable on gemtesa  8. 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  Janene Harvey. Biagio Borg [facility] [phone]

## 2023-02-08 NOTE — Patient Instructions (Addendum)
Continue metformin 500 mg by mouth twice daily Continue jardiance until complete START januvia 100 mg daily for diabetes.    INCREASE NORVASC to 1 tablet =10 mg by mouth daily

## 2023-02-09 LAB — HEMOGLOBIN A1C
Hgb A1c MFr Bld: 12.8 % of total Hgb — ABNORMAL HIGH (ref <5.7)
Mean Plasma Glucose: 321 mg/dL
eAG (mmol/L): 17.8 mmol/L

## 2023-02-09 LAB — COMPLETE METABOLIC PANEL WITH GFR
AG Ratio: 1.4 (calc) (ref 1.0–2.5)
ALT: 13 U/L (ref 6–29)
AST: 15 U/L (ref 10–35)
Albumin: 4.1 g/dL (ref 3.6–5.1)
Alkaline phosphatase (APISO): 78 U/L (ref 37–153)
BUN/Creatinine Ratio: 18 (calc) (ref 6–22)
BUN: 24 mg/dL (ref 7–25)
CO2: 21 mmol/L (ref 20–32)
Calcium: 9.4 mg/dL (ref 8.6–10.4)
Chloride: 106 mmol/L (ref 98–110)
Creat: 1.37 mg/dL — ABNORMAL HIGH (ref 0.60–1.00)
Globulin: 3 g/dL (calc) (ref 1.9–3.7)
Glucose, Bld: 287 mg/dL — ABNORMAL HIGH (ref 65–99)
Potassium: 3.9 mmol/L (ref 3.5–5.3)
Sodium: 139 mmol/L (ref 135–146)
Total Bilirubin: 0.5 mg/dL (ref 0.2–1.2)
Total Protein: 7.1 g/dL (ref 6.1–8.1)
eGFR: 41 mL/min/{1.73_m2} — ABNORMAL LOW (ref >=60)

## 2023-02-09 LAB — CBC WITH DIFFERENTIAL/PLATELET
Absolute Monocytes: 460 cells/uL (ref 200–950)
Basophils Absolute: 18 cells/uL (ref 0–200)
Basophils Relative: 0.4 %
Eosinophils Absolute: 152 cells/uL (ref 15–500)
Eosinophils Relative: 3.3 %
HCT: 34.7 % — ABNORMAL LOW (ref 35.0–45.0)
Hemoglobin: 11.8 g/dL (ref 11.7–15.5)
Lymphs Abs: 989 cells/uL (ref 850–3900)
MCH: 31.2 pg (ref 27.0–33.0)
MCHC: 34 g/dL (ref 32.0–36.0)
MCV: 91.8 fL (ref 80.0–100.0)
MPV: 11.3 fL (ref 7.5–12.5)
Monocytes Relative: 10 %
Neutro Abs: 2981 cells/uL (ref 1500–7800)
Neutrophils Relative %: 64.8 %
Platelets: 206 10*3/uL (ref 140–400)
RBC: 3.78 10*6/uL — ABNORMAL LOW (ref 3.80–5.10)
RDW: 13.2 % (ref 11.0–15.0)
Total Lymphocyte: 21.5 %
WBC: 4.6 10*3/uL (ref 3.8–10.8)

## 2023-02-09 LAB — LIPID PANEL
Cholesterol: 177 mg/dL (ref <200)
HDL: 89 mg/dL (ref >=50)
LDL Cholesterol (Calc): 73 mg/dL (calc)
Non-HDL Cholesterol (Calc): 88 mg/dL (calc) (ref <130)
Total CHOL/HDL Ratio: 2 (calc) (ref <5.0)
Triglycerides: 74 mg/dL (ref <150)

## 2023-02-10 ENCOUNTER — Other Ambulatory Visit: Payer: Self-pay | Admitting: Nurse Practitioner

## 2023-02-10 DIAGNOSIS — Z1231 Encounter for screening mammogram for malignant neoplasm of breast: Secondary | ICD-10-CM

## 2023-02-22 DIAGNOSIS — C50911 Malignant neoplasm of unspecified site of right female breast: Secondary | ICD-10-CM | POA: Diagnosis not present

## 2023-03-13 ENCOUNTER — Other Ambulatory Visit: Payer: Self-pay | Admitting: Internal Medicine

## 2023-03-13 DIAGNOSIS — I480 Paroxysmal atrial fibrillation: Secondary | ICD-10-CM

## 2023-03-15 NOTE — Telephone Encounter (Signed)
Eliquis 5mg  refill request received. Patient is 72 years old, weight-89.4kg, Crea-1.37 on 02/08/23, Diagnosis-Afib, and last seen by Otilio Saber on 07/14/22. Dose is appropriate based on dosing criteria. Will send in refill to requested pharmacy.

## 2023-03-18 ENCOUNTER — Encounter: Payer: Self-pay | Admitting: Nurse Practitioner

## 2023-03-19 ENCOUNTER — Encounter: Payer: Self-pay | Admitting: Nurse Practitioner

## 2023-03-19 ENCOUNTER — Ambulatory Visit (INDEPENDENT_AMBULATORY_CARE_PROVIDER_SITE_OTHER): Payer: Medicare HMO | Admitting: Nurse Practitioner

## 2023-03-19 VITALS — BP 136/78 | HR 61 | Temp 97.5°F | Ht 66.5 in | Wt 200.0 lb

## 2023-03-19 DIAGNOSIS — I10 Essential (primary) hypertension: Secondary | ICD-10-CM | POA: Diagnosis not present

## 2023-03-19 DIAGNOSIS — E1122 Type 2 diabetes mellitus with diabetic chronic kidney disease: Secondary | ICD-10-CM

## 2023-03-19 DIAGNOSIS — N1831 Chronic kidney disease, stage 3a: Secondary | ICD-10-CM

## 2023-03-19 DIAGNOSIS — I872 Venous insufficiency (chronic) (peripheral): Secondary | ICD-10-CM

## 2023-03-19 MED ORDER — METFORMIN HCL 500 MG PO TABS: 500.0000 mg | ORAL_TABLET | Freq: Two times a day (BID) | ORAL | Status: DC

## 2023-03-19 NOTE — Progress Notes (Signed)
Careteam: Patient Care Team: Sharon Seller, NP as PCP - General (Geriatric Medicine) Duke Salvia, MD as PCP - Electrophysiology (Cardiology) Theda Belfast, OD (Optometry)  PLACE OF SERVICE:  [facility]  Advanced Directive information Does Patient Have a Medical Advance Directive?: Yes, Type of Advance Directive: Out of facility DNR (pink MOST or yellow form), Pre-existing out of facility DNR order (yellow form or pink MOST form): Yellow form placed in chart (order not valid for inpatient use), Does patient want to make changes to medical advance directive?: No - Patient declined  Allergies  Allergen Reactions   Codeine Other (See Comments)   Hydrocodone Nausea And Vomiting   Lisinopril     Sick on the stomach    Chief Complaint  Patient presents with   Follow-up    5 week follow-up on blood pressure and diabetes. Discuss metformin dose and instructions. Currently patient is taking 1 by mouth twice daily, however the medication list is different. Patient has blood sugar readings      HPI: Patient is a 72 y.o. female for follow up on blood sugars.  She is taking metformin 500 mg by mouth twice daily- once daily blood sugars were remaining over 175 now less than 150. She still has supply of jardiance and waiting for it to complete has about 3 more weeks of this then she will start [place].  She has already picked up [place] was 400$ a MONTH! She is staying away from noodles and white food.    She also takes eliquis wish is 400$ a month and gemtesa which is an additional 400$ a month!  Norvasc was also increased last month. Blood pressure has improved.   Left leg will swell occasionally. Better in the morning.  Review of Systems:  Review of Systems  Constitutional:  Negative for chills, fever and weight loss.  HENT:  Negative for tinnitus.   Eyes:        Occasionally will have blurred vision  Respiratory:  Negative for cough, sputum production and  shortness of breath.   Cardiovascular:  Negative for chest pain, palpitations and leg swelling.  Gastrointestinal:  Negative for abdominal pain, constipation, diarrhea and heartburn.  Genitourinary:  Negative for dysuria, frequency and urgency.  Musculoskeletal:  Negative for back pain, falls, joint pain and myalgias.  Skin: Negative.   Neurological:  Negative for dizziness and headaches.  Psychiatric/Behavioral:  Negative for depression and memory loss. The patient does not have insomnia.     Past Medical History:  Diagnosis Date   Anxiety    Arthritis    osteoarthritis   Breast cancer (HCC) 2008   right, ER/PR -   CHF (congestive heart failure) (HCC)    Depression    Diabetes mellitus    TYPE 2   Gastroparesis    Hx of radiation therapy 04/04/07 to 05/20/07   right breast/6260 cGy   Hypercholesterolemia    Hypertension    Lumbago    Malignant neoplasm of breast (female), unspecified site    Nonspecific elevation of levels of transaminase or lactic acid dehydrogenase (LDH)    Osteoarthrosis, unspecified whether generalized or localized, unspecified site    Regional enteritis of unspecified site    Restless legs syndrome (RLS)    TIA (transient ischemic attack)    Urinary frequency    Uterine prolapse without mention of vaginal wall prolapse    Vitamin deficiency    Past Surgical History:  Procedure Laterality Date   ABDOMINAL HYSTERECTOMY  1997   TAH/BSO, ENDOMETRIAL SARCOMA   ARTHROPLASTY     left knee   BREAST LUMPECTOMY Right 11/17/06   re-excision 01/13/07   CATARACT EXTRACTION Right 08/2019   CATARACT EXTRACTION Left 07/2019   TOTAL KNEE ARTHROPLASTY Left 06/10/2011       TOTAL KNEE ARTHROPLASTY Right 2006, 2009   Dr Wyline Mood   TOTAL KNEE ARTHROPLASTY Left 2008   Dr Wyline Mood   WRIST SURGERY     carpel tunnel   Social History:   reports that she quit smoking about 31 years ago. Her smoking use included cigarettes. She started smoking about 51 years ago. She has a  10 pack-year smoking history. She has never used smokeless tobacco. She reports that she does not drink alcohol and does not use drugs.  Family History  Problem Relation Age of Onset   Diabetes Mother    Diabetes Father    Aneurysm Sister    Arthritis Sister    Diabetes Brother    Heart Problems Brother    Colon cancer Neg Hx     Medications: Patient's Medications  New Prescriptions   No medications on file  Previous Medications   ACCU-CHEK FASTCLIX LANCETS MISC    Check blood sugar once daily as directed E11.59   ACETAMINOPHEN (TYLENOL) 500 MG TABLET    Take 1,000 mg by mouth as needed.   ALCOHOL SWABS (B-D SINGLE USE SWABS BUTTERFLY) PADS    1 Swab by Does not apply route as needed.   AMLODIPINE (NORVASC) 10 MG TABLET    Take 1 tablet (10 mg total) by mouth daily.   APIXABAN (ELIQUIS) 5 MG TABS TABLET    TAKE 1 TABLET TWICE DAILY   ASCORBIC ACID (VITAMIN C) 500 MG TABLET    Take 1 tablet by mouth daily.   BLOOD GLUCOSE MONITORING SUPPL (ACCU-CHEK GUIDE ME) W/DEVICE KIT    1 each by Does not apply route as needed.   CARVEDILOL (COREG) 25 MG TABLET    TAKE 1 TABLET TWICE DAILY WITH MEALS   EMPAGLIFLOZIN (JARDIANCE) 10 MG TABS TABLET    TAKE 1 TABLET BY MOUTH DAILY BEFORE BREAKFAST.   FERROUS SULFATE (IRON) 325 (65 FE) MG TABS    Take by mouth daily.   GLUCOSE BLOOD (ACCU-CHEK GUIDE) TEST STRIP    Use as instructed   LOSARTAN (COZAAR) 100 MG TABLET    TAKE 1 TABLET EVERY DAY FOR BLOOD PRESSURE   MENTHOL, TOPICAL ANALGESIC, (BIOFREEZE) 4 % GEL    Apply 3 oz topically 3 (three) times daily as needed. Apply to both knees for pain   METFORMIN (GLUCOPHAGE) 500 MG TABLET    Take 1 tablet (500 mg total) by mouth daily with breakfast.   POLYETHYLENE GLYCOL (MIRALAX / GLYCOLAX) 17 G PACKET    Take 17 g by mouth as needed.   ROSUVASTATIN (CRESTOR) 40 MG TABLET    TAKE 1 TABLET EVERY DAY   SITAGLIPTIN (JANUVIA) 100 MG TABLET    Take 1 tablet (100 mg total) by mouth daily.   VIBEGRON (GEMTESA)  75 MG TABS    Take 1 tablet by mouth daily.  Modified Medications   No medications on file  Discontinued Medications   TRIAMCINOLONE OINTMENT (KENALOG) 0.1 %    as needed.    Physical Exam:  Vitals:   03/19/23 0749 03/19/23 0831  BP: (!) 140/72 136/78  Pulse: 61   Temp: (!) 97.5 F (36.4 C)   TempSrc: Temporal   SpO2: 99%  Weight: 200 lb (90.7 kg)   Height: 5' 6.5" (1.689 m)    Body mass index is 31.8 kg/m. Wt Readings from Last 3 Encounters:  03/19/23 200 lb (90.7 kg)  02/08/23 197 lb 3.2 oz (89.4 kg)  01/08/23 197 lb 9.6 oz (89.6 kg)    Physical Exam Constitutional:      General: She is not in acute distress.    Appearance: She is well-developed. She is not diaphoretic.  HENT:     Head: Normocephalic and atraumatic.     Mouth/Throat:     Pharynx: No oropharyngeal exudate.  Eyes:     Conjunctiva/sclera: Conjunctivae normal.     Pupils: Pupils are equal, round, and reactive to light.  Cardiovascular:     Rate and Rhythm: Normal rate and regular rhythm.     Heart sounds: Normal heart sounds.  Pulmonary:     Effort: Pulmonary effort is normal.     Breath sounds: Normal breath sounds.  Abdominal:     General: Bowel sounds are normal.     Palpations: Abdomen is soft.  Musculoskeletal:     Cervical back: Normal range of motion and neck supple.     Right lower leg: No edema.     Left lower leg: No edema.  Skin:    General: Skin is warm and dry.  Neurological:     Mental Status: She is alert.  Psychiatric:        Mood and Affect: Mood normal.     Labs reviewed: Basic Metabolic Panel: Recent Labs    04/24/22 0945 10/30/22 0915 02/08/23 0900  NA 139 142 139  K 4.5 3.5 3.9  CL 105 106 106  CO2 24 26 21   GLUCOSE 90 130* 287*  BUN 31* 24 24  CREATININE 1.55* 1.42* 1.37*  CALCIUM 9.3 9.9 9.4  TSH  --  0.83  --    Liver Function Tests: Recent Labs    04/24/22 0945 10/30/22 0915 02/08/23 0900  AST 15 16 15   ALT 9 11 13   BILITOT 0.6 0.4 0.5   PROT 6.7 7.7 7.1   No results for input(s): "LIPASE", "AMYLASE" in the last 8760 hours. No results for input(s): "AMMONIA" in the last 8760 hours. CBC: Recent Labs    04/24/22 0945 10/30/22 0915 02/08/23 0900  WBC 4.7 4.4 4.6  NEUTROABS 2,942 2,662 2,981  HGB 11.3* 11.7 11.8  HCT 33.1* 35.2 34.7*  MCV 94.8 91.7 91.8  PLT 176 209 206   Lipid Panel: Recent Labs    02/08/23 0900  CHOL 177  HDL 89  LDLCALC 73  TRIG 74  CHOLHDL 2.0   TSH: Recent Labs    10/30/22 0915  TSH 0.83   A1C: Lab Results  Component Value Date   HGBA1C 12.8 (H) 02/08/2023     Assessment/Plan 1. Type 2 diabetes mellitus with stage 3a chronic kidney disease, without long-term current use of insulin (HCC) Wll continue jardiance until complete and then start januvia.  She will notify if blood sugars significantly increase.  Goal fasting blood sugar <150.  Continue dietary modifications.  - metFORMIN (GLUCOPHAGE) 500 MG tablet; Take 1 tablet (500 mg total) by mouth 2 (two) times daily with a meal. - Hemoglobin A1c; Future - BASIC METABOLIC PANEL WITH GFR; Future  2. Essential hypertension, benign Improved blood pressure, will continue current regimen.   3. Venous insufficiency --encouraged to elevate legs above level of heart as tolerates, low sodium diet, compression hose as tolerates (on in am,  off in pm)   Follow up in 7 weeks with labs prior for A1c and follow up on diabetes htn  Dameon Soltis K. Biagio Borg [facility] [phone]

## 2023-03-19 NOTE — Patient Instructions (Addendum)
To get labs 9/6 and follow up 9/9  Bring blood pressure log and blood sugar log  Continue to work on dietary modifications.  Continue to exercise.    MAKE DIABETIC EYE APPT with ophthalmology

## 2023-03-31 ENCOUNTER — Other Ambulatory Visit: Payer: Self-pay | Admitting: Nurse Practitioner

## 2023-03-31 DIAGNOSIS — I10 Essential (primary) hypertension: Secondary | ICD-10-CM

## 2023-03-31 NOTE — Telephone Encounter (Signed)
Pharmacy requested refill.  Rx was changed at [facility] in June and Rx sent to pharmacy at that time.   2. Essential hypertension, benign -Blood pressure well controlled, goal bp <140/90 Continue current medications and dietary modifications follow metabolic panel Will increase norvasc to 10 mg daily  - COMPLETE METABOLIC PANEL WITH GFR - CBC with Differential/Platelet - amLODipine (NORVASC) 10 MG tablet; Take 1 tablet (10 mg total) by mouth daily Dispense: 90 tablet; Refill: 1

## 2023-04-01 ENCOUNTER — Ambulatory Visit
Admission: RE | Admit: 2023-04-01 | Discharge: 2023-04-01 | Disposition: A | Discharge status: Home / Self Care | Payer: Medicare HMO | Source: Ambulatory Visit | Attending: Nurse Practitioner | Admitting: Nurse Practitioner

## 2023-04-01 DIAGNOSIS — Z1231 Encounter for screening mammogram for malignant neoplasm of breast: Secondary | ICD-10-CM

## 2023-04-17 DIAGNOSIS — E119 Type 2 diabetes mellitus without complications: Secondary | ICD-10-CM | POA: Diagnosis not present

## 2023-04-22 ENCOUNTER — Other Ambulatory Visit: Payer: Self-pay

## 2023-04-22 DIAGNOSIS — N1831 Chronic kidney disease, stage 3a: Secondary | ICD-10-CM

## 2023-05-06 ENCOUNTER — Other Ambulatory Visit: Payer: Self-pay | Admitting: Nurse Practitioner

## 2023-05-14 ENCOUNTER — Other Ambulatory Visit: Payer: Medicare HMO

## 2023-05-14 DIAGNOSIS — N1831 Chronic kidney disease, stage 3a: Secondary | ICD-10-CM | POA: Diagnosis not present

## 2023-05-14 DIAGNOSIS — E1122 Type 2 diabetes mellitus with diabetic chronic kidney disease: Secondary | ICD-10-CM | POA: Diagnosis not present

## 2023-05-15 LAB — BASIC METABOLIC PANEL WITH GFR
BUN/Creatinine Ratio: 17 (calc) (ref 6–22)
BUN: 30 mg/dL — ABNORMAL HIGH (ref 7–25)
CO2: 26 mmol/L (ref 20–32)
Calcium: 10.1 mg/dL (ref 8.6–10.4)
Chloride: 108 mmol/L (ref 98–110)
Creat: 1.75 mg/dL — ABNORMAL HIGH (ref 0.60–1.00)
Glucose, Bld: 119 mg/dL — ABNORMAL HIGH (ref 65–99)
Potassium: 4.4 mmol/L (ref 3.5–5.3)
Sodium: 142 mmol/L (ref 135–146)
eGFR: 30 mL/min/{1.73_m2} — ABNORMAL LOW (ref >=60)

## 2023-05-15 LAB — HEMOGLOBIN A1C
Hgb A1c MFr Bld: 8.6 %{Hb} — ABNORMAL HIGH (ref <5.7)
Mean Plasma Glucose: 200 mg/dL
eAG (mmol/L): 11.1 mmol/L

## 2023-05-17 ENCOUNTER — Other Ambulatory Visit: Payer: Self-pay | Admitting: Nurse Practitioner

## 2023-05-17 ENCOUNTER — Encounter: Payer: Self-pay | Admitting: Nurse Practitioner

## 2023-05-17 ENCOUNTER — Ambulatory Visit (INDEPENDENT_AMBULATORY_CARE_PROVIDER_SITE_OTHER): Payer: Medicare HMO | Admitting: Nurse Practitioner

## 2023-05-17 VITALS — BP 132/78 | HR 74 | Temp 97.1°F | Ht 66.5 in | Wt 199.0 lb

## 2023-05-17 DIAGNOSIS — I480 Paroxysmal atrial fibrillation: Secondary | ICD-10-CM | POA: Diagnosis not present

## 2023-05-17 DIAGNOSIS — I10 Essential (primary) hypertension: Secondary | ICD-10-CM | POA: Diagnosis not present

## 2023-05-17 DIAGNOSIS — E1122 Type 2 diabetes mellitus with diabetic chronic kidney disease: Secondary | ICD-10-CM | POA: Diagnosis not present

## 2023-05-17 DIAGNOSIS — N183 Chronic kidney disease, stage 3 unspecified: Secondary | ICD-10-CM | POA: Diagnosis not present

## 2023-05-17 DIAGNOSIS — N1831 Chronic kidney disease, stage 3a: Secondary | ICD-10-CM

## 2023-05-17 MED ORDER — SITAGLIPTIN PHOSPHATE 50 MG PO TABS
50.0000 mg | ORAL_TABLET | Freq: Every day | ORAL | 0 refills | Status: DC
Start: 2023-05-17 — End: 2023-06-14

## 2023-05-17 NOTE — Patient Instructions (Addendum)
Continue januvia but different dose- 50 mg daily  Increase hydration- drink water   Make appt for labs on Friday 9/13

## 2023-05-17 NOTE — Progress Notes (Signed)
Careteam: Patient Care Team: Sharon Seller, NP as PCP - General (Geriatric Medicine) Duke Salvia, MD as PCP - Electrophysiology (Cardiology) Theda Belfast, OD (Optometry)  PLACE OF SERVICE:  [facility]  Advanced Directive information Does Patient Have a Medical Advance Directive?: Yes, Type of Advance Directive: Out of facility DNR (pink MOST or yellow form), Pre-existing out of facility DNR order (yellow form or pink MOST form): Pink MOST form placed in chart (order not valid for inpatient use), Does patient want to make changes to medical advance directive?: No - Patient declined  Allergies  Allergen Reactions   Codeine Other (See Comments)   Hydrocodone Nausea And Vomiting   Lisinopril     Sick on the stomach    Chief Complaint  Patient presents with   Follow-up    8 week follow-up and discuss labs . Discuss need for eye exam (completed by Hoyt Koch about 2 weeks ago, per patient) , covid boosters, AWV, and flu vaccine(patient would like to wait until Friday). Discuss diabetic medication, patient out of Januvia since Saturday. Per patient she was told to stop the [place].      HPI: Patient is a 72 y.o. female presents for diabetes follow-up.  Was taking jardiance until it ran out, last dose was taken 8-9. Started Januvia 8-10.  She brings a log of fasting CBG on both medications, with Jardiance ranging from 100-147 and Januvia 86-130.  A1C improved from 12.8 (6/3) to 8.6 (9/6). Staying away from noodles and "white foods." [facility].  Denies N/V/D. Creatinine 1.75 and GFR 30, worsened from baseline. States during the day she doesn't drink water, she drinks 2 water bottles throughout the night though. Drinks coffee and tea during the day. Thinks her elevated blood sugars affected her kidney function because she was not drinking enough water when her sugars were high.   Did her diabetic eye exam a couple of weeks ago, she  requested to have paperwork sent over.   Has cardiology follow-up in November, denies chest pain, shortness of breath, or increased swelling. BP at goal.   Review of Systems:  Review of Systems  Constitutional:  Negative for chills, fever and weight loss.  Respiratory:  Negative for cough and shortness of breath.   Cardiovascular:  Positive for leg swelling (improved). Negative for chest pain and palpitations.  Gastrointestinal:  Negative for abdominal pain, diarrhea, nausea and vomiting.  Genitourinary:  Negative for dysuria.  Neurological:  Negative for dizziness, weakness and headaches.    Past Medical History:  Diagnosis Date   Anxiety    Arthritis    osteoarthritis   Breast cancer (HCC) 2008   right, ER/PR -   CHF (congestive heart failure) (HCC)    Depression    Diabetes mellitus    TYPE 2   Gastroparesis    Hx of radiation therapy 04/04/07 to 05/20/07   right breast/6260 cGy   Hypercholesterolemia    Hypertension    Lumbago    Malignant neoplasm of breast (female), unspecified site    Nonspecific elevation of levels of transaminase or lactic acid dehydrogenase (LDH)    Osteoarthrosis, unspecified whether generalized or localized, unspecified site    Regional enteritis of unspecified site    Restless legs syndrome (RLS)    TIA (transient ischemic attack)    Urinary frequency    Uterine prolapse without mention of vaginal wall prolapse    Vitamin deficiency    Past  Surgical History:  Procedure Laterality Date   ABDOMINAL HYSTERECTOMY  1997   TAH/BSO, ENDOMETRIAL SARCOMA   ARTHROPLASTY     left knee   BREAST LUMPECTOMY Right 11/17/06   re-excision 01/13/07   CATARACT EXTRACTION Right 08/2019   CATARACT EXTRACTION Left 07/2019   TOTAL KNEE ARTHROPLASTY Left 06/10/2011       TOTAL KNEE ARTHROPLASTY Right 2006, 2009   Dr Wyline Mood   TOTAL KNEE ARTHROPLASTY Left 2008   Dr Wyline Mood   WRIST SURGERY     carpel tunnel   Social History:   reports that she quit smoking  about 31 years ago. Her smoking use included cigarettes. She started smoking about 51 years ago. She has a 10 pack-year smoking history. She has never used smokeless tobacco. She reports that she does not drink alcohol and does not use drugs.  Family History  Problem Relation Age of Onset   Diabetes Mother    Diabetes Father    Aneurysm Sister    Arthritis Sister    Diabetes Brother    Heart Problems Brother    Colon cancer Neg Hx     Medications: Patient's Medications  New Prescriptions   No medications on file  Previous Medications   ACCU-CHEK FASTCLIX LANCETS MISC    Check blood sugar once daily as directed E11.59   ACETAMINOPHEN (TYLENOL) 500 MG TABLET    Take 1,000 mg by mouth as needed.   ALCOHOL SWABS (DROPSAFE ALCOHOL PREP) 70 % PADS    USE AS DIRECTED   AMLODIPINE (NORVASC) 10 MG TABLET    Take 1 tablet (10 mg total) by mouth daily.   APIXABAN (ELIQUIS) 5 MG TABS TABLET    TAKE 1 TABLET TWICE DAILY   ASCORBIC ACID (VITAMIN C) 500 MG TABLET    Take 1 tablet by mouth daily.   BLOOD GLUCOSE MONITORING SUPPL (ACCU-CHEK GUIDE ME) W/DEVICE KIT    1 each by Does not apply route as needed.   CARVEDILOL (COREG) 25 MG TABLET    TAKE 1 TABLET TWICE DAILY WITH MEALS   FERROUS SULFATE (IRON) 325 (65 FE) MG TABS    Take by mouth daily.   GLUCOSE BLOOD (ACCU-CHEK GUIDE) TEST STRIP    Use as instructed   LOSARTAN (COZAAR) 100 MG TABLET    TAKE 1 TABLET EVERY DAY FOR BLOOD PRESSURE   MENTHOL, TOPICAL ANALGESIC, (BIOFREEZE) 4 % GEL    Apply 3 oz topically 3 (three) times daily as needed. Apply to both knees for pain   METFORMIN (GLUCOPHAGE) 500 MG TABLET    Take 1 tablet (500 mg total) by mouth 2 (two) times daily with a meal.   POLYETHYLENE GLYCOL (MIRALAX / GLYCOLAX) 17 G PACKET    Take 17 g by mouth as needed.   ROSUVASTATIN (CRESTOR) 40 MG TABLET    TAKE 1 TABLET EVERY DAY   VIBEGRON (GEMTESA) 75 MG TABS    Take 1 tablet by mouth daily.  Modified Medications   Modified Medication  Previous Medication   SITAGLIPTIN (JANUVIA) 50 MG TABLET sitaGLIPtin (JANUVIA) 100 MG tablet      Take 1 tablet (50 mg total) by mouth daily.    Take 1 tablet (100 mg total) by mouth daily.  Discontinued Medications   EMPAGLIFLOZIN (JARDIANCE) 10 MG TABS TABLET    TAKE 1 TABLET BY MOUTH DAILY BEFORE BREAKFAST.    Physical Exam:  Vitals:   05/17/23 0818  BP: 132/78  Pulse: 74  Temp: (!) 97.1 F (36.2  C)  TempSrc: Temporal  SpO2: 99%  Weight: 90.3 kg  Height: 5' 6.5" (1.689 m)   Body mass index is 31.64 kg/m. Wt Readings from Last 3 Encounters:  05/17/23 90.3 kg  03/19/23 90.7 kg  02/08/23 89.4 kg    Physical Exam Constitutional:      Appearance: Normal appearance.  Cardiovascular:     Rate and Rhythm: Rhythm irregular.     Pulses: Normal pulses.     Comments: Atrial fibrillation Pulmonary:     Effort: Pulmonary effort is normal.     Breath sounds: Normal breath sounds.  Abdominal:     General: Bowel sounds are normal.     Palpations: Abdomen is soft.  Musculoskeletal:        General: Normal range of motion.     Right lower leg: Edema present.     Left lower leg: Edema present.  Skin:    General: Skin is warm and dry.  Neurological:     General: No focal deficit present.     Mental Status: She is alert and oriented to person, place, and time. Mental status is at baseline.  Psychiatric:        Mood and Affect: Mood normal.        Behavior: Behavior normal.     Labs reviewed: Basic Metabolic Panel: Recent Labs    10/30/22 0915 02/08/23 0900 05/14/23 0827  NA 142 139 142  K 3.5 3.9 4.4  CL 106 106 108  CO2 26 21 26   GLUCOSE 130* 287* 119*  BUN 24 24 30*  CREATININE 1.42* 1.37* 1.75*  CALCIUM 9.9 9.4 10.1  TSH 0.83  --   --    Liver Function Tests: Recent Labs    10/30/22 0915 02/08/23 0900  AST 16 15  ALT 11 13  BILITOT 0.4 0.5  PROT 7.7 7.1   No results for input(s): "LIPASE", "AMYLASE" in the last 8760 hours. No results for input(s):  "AMMONIA" in the last 8760 hours. CBC: Recent Labs    10/30/22 0915 02/08/23 0900  WBC 4.4 4.6  NEUTROABS 2,662 2,981  HGB 11.7 11.8  HCT 35.2 34.7*  MCV 91.7 91.8  PLT 209 206   Lipid Panel: Recent Labs    02/08/23 0900  CHOL 177  HDL 89  LDLCALC 73  TRIG 74  CHOLHDL 2.0   TSH: Recent Labs    10/30/22 0915  TSH 0.83   A1C: Lab Results  Component Value Date   HGBA1C 8.6 (H) 05/14/2023    Assessment/Plan  1. Type 2 diabetes mellitus with stage 3a chronic kidney disease, without long-term current use of insulin (HCC) -continue metformin with [place]  -Encouraged dietary compliance, routine foot care/monitoring and to keep up with diabetic eye exams through ophthalmology  - sitaGLIPtin (JANUVIA) 50 MG tablet; Take 1 tablet (50 mg total) by mouth daily.  Dispense: 30 tablet; Refill: 0  2. Essential hypertension, benign -Controlled; continue current medication and diet regimen.   3. CKD stage 3 due to type 2 diabetes mellitus (HCC) - BASIC METABOLIC PANEL WITH GFR; Future (on 9/13) -increase water intake, decrease tea/coffee intake  Will need to dose adjust medication if renal function does not improve.  Avoid NSAIDS  4. AF (paroxysmal atrial fibrillation) (HCC) -continue current medication regimen -keep follow-up with cardiology  Return in about 3 months (around 08/16/2023) for routine follow up, labs prior to appt.    Rollen Sox, [place] Student  I personally was present during the history, physical  exam and medical decision-making activities of this service and have verified that the service and findings are accurately documented in the student's note  Abbey Chatters, NP

## 2023-05-21 ENCOUNTER — Other Ambulatory Visit: Payer: Medicare HMO

## 2023-05-21 ENCOUNTER — Ambulatory Visit (INDEPENDENT_AMBULATORY_CARE_PROVIDER_SITE_OTHER): Payer: Medicare HMO

## 2023-05-21 DIAGNOSIS — E1122 Type 2 diabetes mellitus with diabetic chronic kidney disease: Secondary | ICD-10-CM | POA: Diagnosis not present

## 2023-05-21 DIAGNOSIS — N183 Chronic kidney disease, stage 3 unspecified: Secondary | ICD-10-CM | POA: Diagnosis not present

## 2023-05-21 DIAGNOSIS — Z23 Encounter for immunization: Secondary | ICD-10-CM | POA: Diagnosis not present

## 2023-05-22 LAB — BASIC METABOLIC PANEL WITH GFR
BUN/Creatinine Ratio: 16 (calc) (ref 6–22)
BUN: 24 mg/dL (ref 7–25)
CO2: 24 mmol/L (ref 20–32)
Calcium: 9.5 mg/dL (ref 8.6–10.4)
Chloride: 108 mmol/L (ref 98–110)
Creat: 1.5 mg/dL — ABNORMAL HIGH (ref 0.60–1.00)
Glucose, Bld: 114 mg/dL — ABNORMAL HIGH (ref 65–99)
Potassium: 4.1 mmol/L (ref 3.5–5.3)
Sodium: 142 mmol/L (ref 135–146)
eGFR: 36 mL/min/{1.73_m2} — ABNORMAL LOW (ref >=60)

## 2023-05-24 ENCOUNTER — Telehealth: Payer: Self-pay

## 2023-05-24 NOTE — Telephone Encounter (Signed)
-----   Message from Sharon Seller sent at 05/24/2023 11:08 AM EDT ----- Kidney function has slightly improved- still elevated but better- continue to increase hydration and we will monitor.  Continue current medications

## 2023-05-31 ENCOUNTER — Other Ambulatory Visit: Payer: Self-pay | Admitting: *Deleted

## 2023-05-31 DIAGNOSIS — E1122 Type 2 diabetes mellitus with diabetic chronic kidney disease: Secondary | ICD-10-CM

## 2023-05-31 MED ORDER — METFORMIN HCL 500 MG PO TABS: 500.0000 mg | ORAL_TABLET | Freq: Two times a day (BID) | ORAL | 1 refills | Status: DC

## 2023-05-31 NOTE — Telephone Encounter (Signed)
Pharmacy requested refill

## 2023-06-04 ENCOUNTER — Encounter: Payer: Medicare HMO | Admitting: Nurse Practitioner

## 2023-06-10 ENCOUNTER — Other Ambulatory Visit: Payer: Self-pay | Admitting: Nurse Practitioner

## 2023-06-13 ENCOUNTER — Other Ambulatory Visit: Payer: Self-pay | Admitting: Nurse Practitioner

## 2023-06-13 DIAGNOSIS — E1122 Type 2 diabetes mellitus with diabetic chronic kidney disease: Secondary | ICD-10-CM

## 2023-06-25 ENCOUNTER — Ambulatory Visit (INDEPENDENT_AMBULATORY_CARE_PROVIDER_SITE_OTHER): Payer: Medicare HMO | Admitting: Nurse Practitioner

## 2023-06-25 ENCOUNTER — Encounter: Payer: Self-pay | Admitting: Nurse Practitioner

## 2023-06-25 VITALS — BP 132/88 | HR 72 | Temp 98.6°F | Resp 20 | Ht 66.5 in | Wt 203.4 lb

## 2023-06-25 DIAGNOSIS — Z0000 Encounter for general adult medical examination without abnormal findings: Secondary | ICD-10-CM | POA: Diagnosis not present

## 2023-06-25 NOTE — Progress Notes (Signed)
Subjective:   [name] is a 72 y.o. female who presents for Medicare Annual (Subsequent) preventive examination.  Visit Complete: In person awv  Cardiac Risk Factors include: advanced age (>58men, >28 women);diabetes mellitus;hypertension;obesity (BMI >30kg/m2)     Objective:    Today's Vitals   06/25/23 1024  BP: 132/88  Pulse: 72  Resp: 20  Temp: 98.6 F (37 C)  SpO2: 98%  Weight: 203 lb 6.4 oz (92.3 kg)  Height: 5' 6.5" (1.689 m)   Body mass index is 32.34 kg/m.     06/25/2023   10:34 AM 05/17/2023    8:20 AM 03/19/2023    7:50 AM 03/18/2023    2:17 PM 02/08/2023    8:26 AM 12/07/2022    9:00 AM 11/03/2022   11:59 AM  Advanced Directives  Does Patient Have a Medical Advance Directive? Yes Yes Yes Yes Yes Yes No  Type of Advance Directive Out of facility DNR (pink MOST or yellow form) Out of facility DNR (pink MOST or yellow form) Out of facility DNR (pink MOST or yellow form) Out of facility DNR (pink MOST or yellow form) Out of facility DNR (pink MOST or yellow form) Healthcare Power of [place];Living will   Does patient want to make changes to medical advance directive? No - Patient declined No - Patient declined No - Patient declined No - Patient declined No - Patient declined No - Patient declined   Copy of Healthcare Power of Attorney in Chart?      No - copy requested   Would patient like information on creating a medical advance directive?       No - Patient declined  Pre-existing out of facility DNR order (yellow form or pink MOST form) Pink MOST form placed in chart (order not valid for inpatient use) Pink MOST form placed in chart (order not valid for inpatient use) Pink MOST form placed in chart (order not valid for inpatient use) Yellow form placed in chart (order not valid for inpatient use)       Current Medications (verified) Outpatient Encounter Medications as of 06/25/2023  Medication Sig   Accu-Chek FastClix Lancets MISC Check blood sugar once  daily as directed E11.59   acetaminophen (TYLENOL) 500 MG tablet Take 1,000 mg by mouth as needed.   Alcohol Swabs (DROPSAFE ALCOHOL PREP) 70 % PADS USE AS DIRECTED   amLODipine (NORVASC) 10 MG tablet Take 1 tablet (10 mg total) by mouth daily.   apixaban (ELIQUIS) 5 MG TABS tablet TAKE 1 TABLET TWICE DAILY   ascorbic acid (VITAMIN C) 500 MG tablet Take 1 tablet by mouth daily.   Blood Glucose Monitoring Suppl (ACCU-CHEK GUIDE ME) w/Device KIT 1 each by Does not apply route as needed.   carvedilol (COREG) 25 MG tablet TAKE 1 TABLET TWICE DAILY WITH MEALS   Ferrous Sulfate (IRON) 325 (65 Fe) MG TABS Take by mouth daily.   glucose blood (ACCU-CHEK GUIDE) test strip Use as instructed   losartan (COZAAR) 100 MG tablet TAKE 1 TABLET EVERY DAY FOR BLOOD PRESSURE   Menthol, Topical Analgesic, (BIOFREEZE) 4 % GEL Apply 3 oz topically 3 (three) times daily as needed. Apply to both knees for pain   metFORMIN (GLUCOPHAGE) 500 MG tablet Take 1 tablet (500 mg total) by mouth 2 (two) times daily with a meal.   polyethylene glycol (MIRALAX / GLYCOLAX) 17 g packet Take 17 g by mouth as needed.   rosuvastatin (CRESTOR) 40 MG tablet TAKE 1 TABLET EVERY  DAY   sitaGLIPtin (JANUVIA) 50 MG tablet TAKE 1 TABLET BY MOUTH EVERY DAY   Vibegron (GEMTESA) 75 MG TABS Take 1 tablet by mouth daily.   No facility-administered encounter medications on file as of 06/25/2023.    Allergies (verified) Codeine, Hydrocodone, and Lisinopril   History: Past Medical History:  Diagnosis Date   Anxiety    Arthritis    osteoarthritis   Breast cancer (HCC) 2008   right, ER/PR -   CHF (congestive heart failure) (HCC)    Depression    Diabetes mellitus    TYPE 2   Gastroparesis    Hx of radiation therapy 04/04/07 to 05/20/07   right breast/6260 cGy   Hypercholesterolemia    Hypertension    Lumbago    Malignant neoplasm of breast (female), unspecified site    Nonspecific elevation of levels of transaminase or lactic acid  dehydrogenase (LDH)    Osteoarthrosis, unspecified whether generalized or localized, unspecified site    Regional enteritis of unspecified site    Restless legs syndrome (RLS)    TIA (transient ischemic attack)    Urinary frequency    Uterine prolapse without mention of vaginal wall prolapse    Vitamin deficiency    Past Surgical History:  Procedure Laterality Date   ABDOMINAL HYSTERECTOMY  1997   TAH/BSO, ENDOMETRIAL SARCOMA   ARTHROPLASTY     left knee   BREAST LUMPECTOMY Right 11/17/06   re-excision 01/13/07   CATARACT EXTRACTION Right 08/2019   CATARACT EXTRACTION Left 07/2019   TOTAL KNEE ARTHROPLASTY Left 06/10/2011       TOTAL KNEE ARTHROPLASTY Right 2006, 2009   Dr Wyline Mood   TOTAL KNEE ARTHROPLASTY Left 2008   Dr Wyline Mood   WRIST SURGERY     carpel tunnel   Family History  Problem Relation Age of Onset   Diabetes Mother    Diabetes Father    Aneurysm Sister    Arthritis Sister    Diabetes Brother    Heart Problems Brother    Colon cancer Neg Hx    Social History   Socioeconomic History   Marital status: Widowed    Spouse name: Not on file   Number of children: 4   Years of education: Not on file   Highest education level: Not on file  Occupational History   Occupation: [employer]  Tobacco Use   Smoking status: Former    Current packs/day: 0.00    Average packs/day: 0.5 packs/day for 20.0 years (10.0 ttl pk-yrs)    Types: Cigarettes    Start date: 09/08/1971    Quit date: 09/08/1991    Years since quitting: 31.8   Smokeless tobacco: Never  Vaping Use   Vaping status: Never Used  Substance and Sexual Activity   Alcohol use: No   Drug use: No   Sexual activity: Never  Other Topics Concern   Not on file  Social History Narrative   Widowed   Lives with daughter   Former smoker-stopped 1993   Alcohol none   Exercise 3 days a week goes to [employer]    Social Determinants of Health   Financial Resource Strain: Not on file  Food Insecurity: Not  on file  Transportation Needs: Not on file  Physical Activity: Not on file  Stress: Not on file  Social Connections: Not on file    Tobacco Counseling Counseling given: Not Answered   Clinical Intake:  Pre-visit preparation completed: Yes  Pain : No/denies pain  BMI - recorded: 32.34 Nutritional Status: BMI > 30  Obese Nutritional Risks: None Diabetes: Yes CBG done?: No Did pt. bring in CBG monitor from home?: No  How often do you need to have someone help you when you read instructions, pamphlets, or other written materials from your doctor or pharmacy?: 3 - Sometimes What is the last grade level you completed in school?: 9th grade  Interpreter Needed?: No  Information entered by :: Porsha McClurkin,CMA   Activities of Daily Living    06/25/2023   10:31 AM  In your present state of health, do you have any difficulty performing the following activities:  Hearing? 0  Vision? 0  Difficulty concentrating or making decisions? 0  Walking or climbing stairs? 0  Dressing or bathing? 0  Doing errands, shopping? 0  Preparing Food and eating ? N  Using the Toilet? N  In the past six months, have you accidently leaked urine? N  Do you have problems with loss of bowel control? Y  Managing your Medications? N  Managing your Finances? N  Housekeeping or managing your Housekeeping? N    Patient Care Team: Sharon Seller, NP as PCP - General (Geriatric Medicine) Duke Salvia, MD as PCP - Electrophysiology (Cardiology) Arie Sabina, OD (Optometry)  Indicate any recent Medical Services you may have received from other than [facility] providers in the past year (date may be approximate).     Assessment:   This is a routine wellness examination for [facility].  Hearing/Vision screen Hearing Screening - Comments:: No hearing concerns Vision Screening - Comments:: No vision concerns/ wear glasses   Goals Addressed   None    Depression Screen    06/25/2023   10:36  AM 05/29/2022    8:58 AM 10/24/2021    8:03 AM 05/27/2021    8:32 AM 05/24/2020    8:16 AM 09/25/2019    1:35 PM 05/05/2019    9:19 AM  PHQ 2/9 Scores  PHQ - 2 Score 0 0 0 0 0 0 0    Fall Risk    06/25/2023   10:35 AM 02/08/2023    8:25 AM 12/07/2022    8:53 AM 10/30/2022    8:31 AM 05/29/2022    8:57 AM  Fall Risk   Falls in the past year? 0 0 0 0 0  Number falls in past yr: 0 0 0 0 0  Injury with Fall? 0 0 0 0 0  Risk for fall due to : No Fall Risks No Fall Risks No Fall Risks No Fall Risks No Fall Risks  Follow up Falls evaluation completed Falls evaluation completed Falls evaluation completed Falls evaluation completed Falls evaluation completed    MEDICARE RISK AT HOME:    TIMED UP AND GO:  Was the test performed?  No    Cognitive Function:    06/25/2023   10:36 AM 04/29/2018    9:33 AM 04/09/2017   10:31 AM 04/10/2016    2:51 PM 04/05/2015    3:25 PM  MMSE - Mini Mental State Exam  Orientation to time 5 5 5 5 5   Orientation to Place 5 5 5 5 5   Registration 3 3 3 3 3   Attention/ Calculation 5 5 4 5 4   Recall 3 3 3 2 2   Language- name 2 objects  2 2 2 2   Language- repeat 1 1 1 1 1   Language- follow 3 step command 3 3 3 3 3   Language- read & follow  direction  1 1 1 1   Write a sentence 1 0 1 1 1   Copy design 0 1 0 0 1  Total score  29 28 28 28         05/29/2022    8:58 AM 05/27/2021    8:36 AM 05/24/2020    8:21 AM  6CIT Screen  What Year? 0 points 0 points 0 points  What month? 0 points 0 points 0 points  What time? 0 points 0 points 0 points  Count back from 20 4 points 0 points 0 points  Months in reverse 0 points 4 points 0 points  Repeat phrase 2 points 4 points 6 points  Total Score 6 points 8 points 6 points    Immunizations Immunization History  Administered Date(s) Administered   Fluad Quad(high Dose 65+) 05/05/2019, 05/15/2021   Fluad Trivalent(High Dose 65+) 05/21/2023   Influenza, High Dose Seasonal PF 06/04/2017, 05/24/2018, 05/22/2020, 06/09/2022    Influenza,inj,Quad PF,6+ Mos 07/05/2015   Influenza-Unspecified 07/05/2013, 06/07/2014, 07/08/2016, 05/22/2020   PFIZER Comirnaty(Gray Top)Covid-19 Tri-Sucrose Vaccine 06/09/2022   PFIZER(Purple Top)SARS-COV-2 Vaccination 12/22/2019, 01/12/2020, 08/09/2020, 01/05/2021, 01/31/2021   Pfizer Covid-19 Vaccine Bivalent Booster 54yrs & up 05/21/2023   Pneumococcal Conjugate-13 03/07/2014   Pneumococcal Polysaccharide-23 06/08/1999, 11/04/2018   Td 09/08/2003   Tdap 11/29/2013   Zoster Recombinant(Shingrix) 05/07/2019, 09/07/2019   Zoster, Live 08/08/2013    TDAP status: Up to date  Flu Vaccine status: Up to date  Pneumococcal vaccine status: Up to date  Covid-19 vaccine status: Information provided on how to obtain vaccines.   Qualifies for Shingles Vaccine? Yes   Zostavax completed Yes   Shingrix Completed?: Yes  Screening Tests Health Maintenance  Topic Date Due   OPHTHALMOLOGY EXAM  03/04/2023   COVID-19 Vaccine (8 - 2023-24 season) 07/16/2023   Diabetic kidney evaluation - Urine ACR  10/31/2023   FOOT EXAM  10/31/2023   HEMOGLOBIN A1C  11/11/2023   DTaP/Tdap/Td (3 - Td or Tdap) 11/30/2023   DEXA SCAN  01/14/2024   Diabetic kidney evaluation - eGFR measurement  05/20/2024   Medicare Annual Wellness (AWV)  06/24/2024   Colonoscopy  01/17/2025   MAMMOGRAM  03/31/2025   Pneumonia Vaccine 62+ Years old  Completed   INFLUENZA VACCINE  Completed   Hepatitis C Screening  Completed   Zoster Vaccines- Shingrix  Completed   HPV VACCINES  Aged Out    Health Maintenance  Health Maintenance Due  Topic Date Due   OPHTHALMOLOGY EXAM  03/04/2023    Colorectal cancer screening: Type of screening: Colonoscopy. Completed 01/18/2015. Repeat every 10 years  Mammogram status: Completed 04/01/2023. Repeat every year  Bone Density status: Completed 01/13/2022. Results reflect: Bone density results: OSTEOPENIA. Repeat every 2 years.  Lung Cancer Screening: (Low Dose CT Chest recommended  if Age 42-80 years, 20 pack-year currently smoking OR have quit w/in 15years.) does not qualify.   Lung Cancer Screening Referral: na  Additional Screening:  Hepatitis C Screening: does qualify; Completed   Vision Screening: Recommended annual ophthalmology exams for early detection of glaucoma and other disorders of the eye. Is the patient up to date with their annual eye exam?  Yes  Who is the provider or what is the name of the office in which the patient attends annual eye exams? [facility]  If pt is not established with a provider, would they like to be referred to a provider to establish care? No .   Dental Screening: Recommended annual dental exams  for proper oral hygiene  Diabetic Foot Exam: Diabetic Foot Exam: Completed 10/31/23  Community Resource Referral / Chronic Care Management: CRR required this visit?  No   CCM required this visit?  No     Plan:     I have personally reviewed and noted the following in the patient's chart:   Medical and social history Use of alcohol, tobacco or illicit drugs  Current medications and supplements including opioid prescriptions. Patient is not currently taking opioid prescriptions. Functional ability and status Nutritional status Physical activity Advanced directives List of other physicians Hospitalizations, surgeries, and ER visits in previous 12 months Vitals Screenings to include cognitive, depression, and falls Referrals and appointments  In addition, I have reviewed and discussed with patient certain preventive protocols, quality metrics, and best practice recommendations. A written personalized care plan for preventive services as well as general preventive health recommendations were provided to patient.     Sharon Seller, NP   06/25/2023

## 2023-06-25 NOTE — Patient Instructions (Signed)
  [name] , Thank you for taking time to come for your Medicare Wellness Visit. I appreciate your ongoing commitment to your health goals. Please review the following plan we discussed and let me know if I can assist you in the future.    This is a list of the screening recommended for you and due dates:  Health Maintenance  Topic Date Due   Eye exam for diabetics  03/04/2023   COVID-19 Vaccine (8 - 2023-24 season) 07/16/2023   Yearly kidney health urinalysis for diabetes  10/31/2023   Complete foot exam   10/31/2023   Hemoglobin A1C  11/11/2023   DTaP/Tdap/Td vaccine (3 - Td or Tdap) 11/30/2023   DEXA scan (bone density measurement)  01/14/2024   Yearly kidney function blood test for diabetes  05/20/2024   Medicare Annual Wellness Visit  06/24/2024   Colon Cancer Screening  01/17/2025   Mammogram  03/31/2025   Pneumonia Vaccine  Completed   Flu Shot  Completed   Hepatitis C Screening  Completed   Zoster (Shingles) Vaccine  Completed   HPV Vaccine  Aged Out

## 2023-07-13 ENCOUNTER — Encounter: Payer: Self-pay | Admitting: Student

## 2023-07-13 ENCOUNTER — Ambulatory Visit: Payer: Medicare HMO | Attending: Student | Admitting: Student

## 2023-07-13 VITALS — BP 132/66 | HR 78 | Ht 66.0 in | Wt 202.0 lb

## 2023-07-13 DIAGNOSIS — I10 Essential (primary) hypertension: Secondary | ICD-10-CM | POA: Diagnosis not present

## 2023-07-13 DIAGNOSIS — H524 Presbyopia: Secondary | ICD-10-CM | POA: Diagnosis not present

## 2023-07-13 DIAGNOSIS — I480 Paroxysmal atrial fibrillation: Secondary | ICD-10-CM

## 2023-07-13 DIAGNOSIS — I429 Cardiomyopathy, unspecified: Secondary | ICD-10-CM

## 2023-07-13 NOTE — Patient Instructions (Signed)
Medication Instructions:  Your physician recommends that you continue on your current medications as directed. Please refer to the Current Medication list given to you today.  *If you need a refill on your cardiac medications before your next appointment, please call your pharmacy*  Lab Work: None If you have labs (blood work) drawn today and your tests are completely normal, you will receive your results only by: MyChart Message (if you have MyChart) OR A paper copy in the mail If you have any lab test that is abnormal or we need to change your treatment, we will call you to review the results.  Follow-Up: At [facility], you and your health needs are our priority.  As part of our continuing mission to provide you with exceptional heart care, we have created designated Provider Care Teams.  These Care Teams include your primary Cardiologist (physician) and Advanced Practice Providers (APPs -  Physician Assistants and Nurse Practitioners) who all work together to provide you with the care you need, when you need it.  Your next appointment:   1 year(s)  Provider:   Casimiro Needle "Otilio Saber, PA-C

## 2023-07-13 NOTE — Progress Notes (Signed)
  Electrophysiology Office Note:   Date:  07/13/2023  ID:  [name], DOB 11-13-1950, MRN [id number]  Primary Cardiologist: None Electrophysiologist: Sherryl Manges, MD      History of Present Illness:   [name] is a 72 y.o. female with h/o AF, DM2, diastolic CHF,  seen today for routine electrophysiology followup.   Since last being seen in our clinic the patient reports doing very well overall. She has some fatigue and SOB with inclines and stairs, but at the same time exercises every day, with 30+ minutes on the bike. She denies chest pain, palpitations, PND, orthopnea, nausea, vomiting, dizziness, syncope, edema, weight gain, or early satiety.  Otherwise does her ADLs without difficulty as well.  Review of systems complete and found to be negative unless listed in HPI.   EP Information / Studies Reviewed:    EKG is ordered today. Personal review as below.  EKG Interpretation Date/Time:  Tuesday July 13 2023 08:04:35 EST Ventricular Rate:  78 PR Interval:  254 QRS Duration:  84 QT Interval:  452 QTC Calculation: 515 R Axis:   2  Text Interpretation: Sinus rhythm with 1st degree A-V block Septal infarct , age undetermined Confirmed by [name] [phone]) on 07/13/2023 8:11:20 AM    Echo 12/2020 LVEF 60-65%, Grade 1 DD, normal RV  Physical Exam:   VS:  BP 132/66   Pulse 78   Ht 5\' 6"  (1.676 m)   Wt 202 lb (91.6 kg)   SpO2 99%   BMI 32.60 kg/m    Wt Readings from Last 3 Encounters:  07/13/23 202 lb (91.6 kg)  06/25/23 203 lb 6.4 oz (92.3 kg)  05/17/23 199 lb (90.3 kg)     GEN: Well nourished, well developed in no acute distress NECK: No JVD; No carotid bruits CARDIAC: Regular rate and rhythm, no murmurs, rubs, gallops RESPIRATORY:  Clear to auscultation without rales, wheezing or rhonchi  ABDOMEN: Soft, non-tender, non-distended EXTREMITIES:  No edema; No deformity   ASSESSMENT AND PLAN:    Paroxysmal atrial fibrillation EKG shows  NSR Continue eliquis 5 mg BID for CHA2DS2/VASc of at least 6 Labs stable september   Chest pain, atypical DOE No further chest pain.  Specifically has DOE with walking, especially up stairs.  Though rides a bike for 30 minutes without difficulty Encouraged increasing the specific exercises that make her tired to work out those muscle groups.   HTN Stable on current regimen   Follow up with Dr. Graciela Husbands in 12 months  Signed, Makayla Freer, PA-C

## 2023-07-28 DIAGNOSIS — N8110 Cystocele, unspecified: Secondary | ICD-10-CM | POA: Diagnosis not present

## 2023-07-28 DIAGNOSIS — Z4689 Encounter for fitting and adjustment of other specified devices: Secondary | ICD-10-CM | POA: Diagnosis not present

## 2023-08-16 ENCOUNTER — Other Ambulatory Visit: Payer: Self-pay

## 2023-08-16 DIAGNOSIS — I480 Paroxysmal atrial fibrillation: Secondary | ICD-10-CM

## 2023-08-16 DIAGNOSIS — N183 Chronic kidney disease, stage 3 unspecified: Secondary | ICD-10-CM

## 2023-08-16 DIAGNOSIS — N1831 Chronic kidney disease, stage 3a: Secondary | ICD-10-CM

## 2023-08-17 ENCOUNTER — Other Ambulatory Visit: Payer: Medicare HMO

## 2023-08-17 DIAGNOSIS — E1122 Type 2 diabetes mellitus with diabetic chronic kidney disease: Secondary | ICD-10-CM | POA: Diagnosis not present

## 2023-08-17 DIAGNOSIS — N1831 Chronic kidney disease, stage 3a: Secondary | ICD-10-CM | POA: Diagnosis not present

## 2023-08-17 DIAGNOSIS — I480 Paroxysmal atrial fibrillation: Secondary | ICD-10-CM | POA: Diagnosis not present

## 2023-08-17 DIAGNOSIS — N183 Chronic kidney disease, stage 3 unspecified: Secondary | ICD-10-CM | POA: Diagnosis not present

## 2023-08-18 LAB — COMPLETE METABOLIC PANEL WITH GFR
AG Ratio: 1.6 (calc) (ref 1.0–2.5)
ALT: 10 U/L (ref 6–29)
AST: 16 U/L (ref 10–35)
Albumin: 4.4 g/dL (ref 3.6–5.1)
Alkaline phosphatase (APISO): 66 U/L (ref 37–153)
BUN/Creatinine Ratio: 19 (calc) (ref 6–22)
BUN: 27 mg/dL — ABNORMAL HIGH (ref 7–25)
CO2: 23 mmol/L (ref 20–32)
Calcium: 9.8 mg/dL (ref 8.6–10.4)
Chloride: 106 mmol/L (ref 98–110)
Creat: 1.4 mg/dL — ABNORMAL HIGH (ref 0.60–1.00)
Globulin: 2.8 g/dL (ref 1.9–3.7)
Glucose, Bld: 95 mg/dL (ref 65–99)
Potassium: 4.1 mmol/L (ref 3.5–5.3)
Sodium: 141 mmol/L (ref 135–146)
Total Bilirubin: 0.5 mg/dL (ref 0.2–1.2)
Total Protein: 7.2 g/dL (ref 6.1–8.1)
eGFR: 39 mL/min/{1.73_m2} — ABNORMAL LOW (ref >=60)

## 2023-08-18 LAB — CBC WITH DIFFERENTIAL/PLATELET
Absolute Lymphocytes: 1487 {cells}/uL (ref 850–3900)
Absolute Monocytes: 354 {cells}/uL (ref 200–950)
Basophils Absolute: 10 {cells}/uL (ref 0–200)
Basophils Relative: 0.2 %
Eosinophils Absolute: 73 {cells}/uL (ref 15–500)
Eosinophils Relative: 1.4 %
HCT: 33.3 % — ABNORMAL LOW (ref 35.0–45.0)
Hemoglobin: 11.2 g/dL — ABNORMAL LOW (ref 11.7–15.5)
MCH: 32 pg (ref 27.0–33.0)
MCHC: 33.6 g/dL (ref 32.0–36.0)
MCV: 95.1 fL (ref 80.0–100.0)
MPV: 11.2 fL (ref 7.5–12.5)
Monocytes Relative: 6.8 %
Neutro Abs: 3276 {cells}/uL (ref 1500–7800)
Neutrophils Relative %: 63 %
Platelets: 192 10*3/uL (ref 140–400)
RBC: 3.5 10*6/uL — ABNORMAL LOW (ref 3.80–5.10)
RDW: 14.3 % (ref 11.0–15.0)
Total Lymphocyte: 28.6 %
WBC: 5.2 10*3/uL (ref 3.8–10.8)

## 2023-08-18 LAB — HEMOGLOBIN A1C
Hgb A1c MFr Bld: 7.2 %{Hb} — ABNORMAL HIGH (ref <5.7)
Mean Plasma Glucose: 160 mg/dL
eAG (mmol/L): 8.9 mmol/L

## 2023-08-18 LAB — LIPID PANEL
Cholesterol: 179 mg/dL (ref <200)
HDL: 90 mg/dL (ref >=50)
LDL Cholesterol (Calc): 74 mg/dL
Non-HDL Cholesterol (Calc): 89 mg/dL (ref <130)
Total CHOL/HDL Ratio: 2 (calc) (ref <5.0)
Triglycerides: 69 mg/dL (ref <150)

## 2023-08-20 ENCOUNTER — Ambulatory Visit (INDEPENDENT_AMBULATORY_CARE_PROVIDER_SITE_OTHER): Payer: Medicare HMO | Admitting: Nurse Practitioner

## 2023-08-20 ENCOUNTER — Encounter: Payer: Self-pay | Admitting: Nurse Practitioner

## 2023-08-20 VITALS — BP 126/78 | HR 77 | Temp 97.5°F | Ht 66.0 in | Wt 206.0 lb

## 2023-08-20 DIAGNOSIS — E7800 Pure hypercholesterolemia, unspecified: Secondary | ICD-10-CM

## 2023-08-20 DIAGNOSIS — E1122 Type 2 diabetes mellitus with diabetic chronic kidney disease: Secondary | ICD-10-CM

## 2023-08-20 DIAGNOSIS — I480 Paroxysmal atrial fibrillation: Secondary | ICD-10-CM | POA: Diagnosis not present

## 2023-08-20 DIAGNOSIS — I10 Essential (primary) hypertension: Secondary | ICD-10-CM

## 2023-08-20 DIAGNOSIS — D6869 Other thrombophilia: Secondary | ICD-10-CM | POA: Diagnosis not present

## 2023-08-20 DIAGNOSIS — N1831 Chronic kidney disease, stage 3a: Secondary | ICD-10-CM | POA: Diagnosis not present

## 2023-08-20 MED ORDER — SITAGLIPTIN PHOSPHATE 50 MG PO TABS: 50.0000 mg | ORAL_TABLET | Freq: Every day | ORAL | 3 refills | Status: DC

## 2023-08-20 NOTE — Progress Notes (Signed)
Careteam: Patient Care Team: Sharon Seller, NP as PCP - General (Geriatric Medicine) Duke Salvia, MD as PCP - Electrophysiology (Cardiology) Arie Sabina, OD (Optometry)  PLACE OF SERVICE:  [facility]  Advanced Directive information Does Patient Have a Medical Advance Directive?: Yes, Type of Advance Directive: Out of facility DNR (pink MOST or yellow form), Pre-existing out of facility DNR order (yellow form or pink MOST form): Pink MOST form placed in chart (order not valid for inpatient use), Does patient want to make changes to medical advance directive?: No - Patient declined  Allergies  Allergen Reactions   Codeine Other (See Comments)   Hydrocodone Nausea And Vomiting   Lisinopril     Sick on the stomach    Chief Complaint  Patient presents with   Medical Management of Chronic Issues    3 month follow-up. Discuss need for eye exam (had eye exam in June 2024, request was sent in October) and covid booster.      HPI: Patient is a 72 y.o. female for routine follow up  A1c is 7.2! Down from 8.6, she is on januvia 50 mg daily she has been on this for 4 months. Cardiologist has completely a form to help bring the cost down.  Also taking metformin 500 mg twice daily Will have diarrhea once a week- which has improved to what she was having  No hypoglycemia.   Blood pressure well controlled on losartan, norvasc and coreg   On crestor 40 mg - LDL 74  Review of Systems:  Review of Systems  Constitutional:  Negative for chills, fever and weight loss.  HENT:  Negative for tinnitus.   Respiratory:  Negative for cough, sputum production and shortness of breath.   Cardiovascular:  Negative for chest pain, palpitations and leg swelling.  Gastrointestinal:  Negative for abdominal pain, constipation, diarrhea and heartburn.  Genitourinary:  Negative for dysuria, frequency and urgency.  Musculoskeletal:  Positive for joint pain (occasionally). Negative for back pain,  falls and myalgias.  Skin: Negative.   Neurological:  Negative for dizziness and headaches.  Psychiatric/Behavioral:  Negative for depression and memory loss. The patient does not have insomnia.    Past Medical History:  Diagnosis Date   Anxiety    Arthritis    osteoarthritis   Breast cancer (HCC) 2008   right, ER/PR -   CHF (congestive heart failure) (HCC)    Depression    Diabetes mellitus    TYPE 2   Gastroparesis    Hx of radiation therapy 04/04/07 to 05/20/07   right breast/6260 cGy   Hypercholesterolemia    Hypertension    Lumbago    Malignant neoplasm of breast (female), unspecified site    Nonspecific elevation of levels of transaminase or lactic acid dehydrogenase (LDH)    Osteoarthrosis, unspecified whether generalized or localized, unspecified site    Regional enteritis of unspecified site    Restless legs syndrome (RLS)    TIA (transient ischemic attack)    Urinary frequency    Uterine prolapse without mention of vaginal wall prolapse    Vitamin deficiency    Past Surgical History:  Procedure Laterality Date   ABDOMINAL HYSTERECTOMY  1997   TAH/BSO, ENDOMETRIAL SARCOMA   ARTHROPLASTY     left knee   BREAST LUMPECTOMY Right 11/17/06   re-excision 01/13/07   CATARACT EXTRACTION Right 08/2019   CATARACT EXTRACTION Left 07/2019   TOTAL KNEE ARTHROPLASTY Left 06/10/2011       TOTAL  KNEE ARTHROPLASTY Right 2006, 2009   Dr Wyline Mood   TOTAL KNEE ARTHROPLASTY Left 2008   Dr Wyline Mood   WRIST SURGERY     carpel tunnel   Social History:   reports that she quit smoking about 31 years ago. Her smoking use included cigarettes. She started smoking about 51 years ago. She has a 10 pack-year smoking history. She has never used smokeless tobacco. She reports that she does not drink alcohol and does not use drugs.  Family History  Problem Relation Age of Onset   Diabetes Mother    Diabetes Father    Aneurysm Sister    Arthritis Sister    Diabetes Brother    Heart Problems  Brother    Colon cancer Neg Hx     Medications: Patient's Medications  New Prescriptions   No medications on file  Previous Medications   ACCU-CHEK FASTCLIX LANCETS MISC    Check blood sugar once daily as directed E11.59   ACETAMINOPHEN (TYLENOL) 500 MG TABLET    Take 1,000 mg by mouth as needed.   ALCOHOL SWABS (DROPSAFE ALCOHOL PREP) 70 % PADS    USE AS DIRECTED   AMLODIPINE (NORVASC) 10 MG TABLET    Take 1 tablet (10 mg total) by mouth daily.   APIXABAN (ELIQUIS) 5 MG TABS TABLET    TAKE 1 TABLET TWICE DAILY   ASCORBIC ACID (VITAMIN C) 500 MG TABLET    Take 1 tablet by mouth daily.   BLOOD GLUCOSE MONITORING SUPPL (ACCU-CHEK GUIDE ME) W/DEVICE KIT    1 each by Does not apply route as needed.   CARVEDILOL (COREG) 25 MG TABLET    TAKE 1 TABLET TWICE DAILY WITH MEALS   FERROUS SULFATE (IRON) 325 (65 FE) MG TABS    Take by mouth daily.   GLUCOSE BLOOD (ACCU-CHEK GUIDE) TEST STRIP    Use as instructed   LOSARTAN (COZAAR) 100 MG TABLET    TAKE 1 TABLET EVERY DAY FOR BLOOD PRESSURE   MENTHOL, TOPICAL ANALGESIC, (BIOFREEZE) 4 % GEL    Apply 3 oz topically 3 (three) times daily as needed. Apply to both knees for pain   METFORMIN (GLUCOPHAGE) 500 MG TABLET    Take 1 tablet (500 mg total) by mouth 2 (two) times daily with a meal.   POLYETHYLENE GLYCOL (MIRALAX / GLYCOLAX) 17 G PACKET    Take 17 g by mouth as needed.   ROSUVASTATIN (CRESTOR) 40 MG TABLET    TAKE 1 TABLET EVERY DAY   SITAGLIPTIN (JANUVIA) 50 MG TABLET    TAKE 1 TABLET BY MOUTH EVERY DAY   VIBEGRON (GEMTESA) 75 MG TABS    Take 1 tablet by mouth daily.  Modified Medications   No medications on file  Discontinued Medications   No medications on file    Physical Exam:  Vitals:   08/20/23 1326  BP: 126/78  Pulse: 77  Temp: (!) 97.5 F (36.4 C)  TempSrc: Temporal  SpO2: 99%  Weight: 206 lb (93.4 kg)  Height: 5\' 6"  (1.676 m)   Body mass index is 33.25 kg/m. Wt Readings from Last 3 Encounters:  08/20/23 206 lb (93.4 kg)   07/13/23 202 lb (91.6 kg)  06/25/23 203 lb 6.4 oz (92.3 kg)    Physical Exam Constitutional:      General: She is not in acute distress.    Appearance: She is well-developed. She is not diaphoretic.  HENT:     Head: Normocephalic and atraumatic.  Mouth/Throat:     Pharynx: No oropharyngeal exudate.  Eyes:     Conjunctiva/sclera: Conjunctivae normal.     Pupils: Pupils are equal, round, and reactive to light.  Cardiovascular:     Rate and Rhythm: Normal rate and regular rhythm.     Heart sounds: Normal heart sounds.  Pulmonary:     Effort: Pulmonary effort is normal.     Breath sounds: Normal breath sounds.  Abdominal:     General: Bowel sounds are normal.     Palpations: Abdomen is soft.  Musculoskeletal:     Cervical back: Normal range of motion and neck supple.     Right lower leg: No edema.     Left lower leg: No edema.  Skin:    General: Skin is warm and dry.  Neurological:     Mental Status: She is alert.  Psychiatric:        Mood and Affect: Mood normal.     Labs reviewed: Basic Metabolic Panel: Recent Labs    10/30/22 0915 02/08/23 0900 05/14/23 0827 05/21/23 0827 08/17/23 0819  NA 142   < > 142 142 141  K 3.5   < > 4.4 4.1 4.1  CL 106   < > 108 108 106  CO2 26   < > 26 24 23   GLUCOSE 130*   < > 119* 114* 95  BUN 24   < > 30* 24 27*  CREATININE 1.42*   < > 1.75* 1.50* 1.40*  CALCIUM 9.9   < > 10.1 9.5 9.8  TSH 0.83  --   --   --   --    < > = values in this interval not displayed.   Liver Function Tests: Recent Labs    10/30/22 0915 02/08/23 0900 08/17/23 0819  AST 16 15 16   ALT 11 13 10   BILITOT 0.4 0.5 0.5  PROT 7.7 7.1 7.2   No results for input(s): "LIPASE", "AMYLASE" in the last 8760 hours. No results for input(s): "AMMONIA" in the last 8760 hours. CBC: Recent Labs    10/30/22 0915 02/08/23 0900 08/17/23 0819  WBC 4.4 4.6 5.2  NEUTROABS 2,662 2,981 3,276  HGB 11.7 11.8 11.2*  HCT 35.2 34.7* 33.3*  MCV 91.7 91.8 95.1   PLT 209 206 192   Lipid Panel: Recent Labs    02/08/23 0900 08/17/23 0819  CHOL 177 179  HDL 89 90  LDLCALC 73 74  TRIG 74 69  CHOLHDL 2.0 2.0   TSH: Recent Labs    10/30/22 0915  TSH 0.83   A1C: Lab Results  Component Value Date   HGBA1C 7.2 (H) 08/17/2023     Assessment/Plan 1. Type 2 diabetes mellitus with stage 3a chronic kidney disease, without long-term current use of insulin (HCC) (Primary) A1c continues to improve, will continue [place] and metformin with dietary mdoifications -Encouraged dietary compliance, routine foot care/monitoring and to keep up with diabetic eye exams through ophthalmology  - Hemoglobin A1c; Future - sitaGLIPtin (JANUVIA) 50 MG tablet; Take 1 tablet (50 mg total) by mouth daily.  Dispense: 90 tablet; Refill: 3  2. AF (paroxysmal atrial fibrillation) (HCC) -rate controlled, Continues on coreg  3. Essential hypertension, benign -Blood pressure well controlled, goal bp <140/90 Continue current medications and dietary modifications follow metabolic panel - COMPLETE METABOLIC PANEL WITH GFR; Future - CBC with Differential/Platelet; Future  4. Pure hypercholesterolemia -continues on crestor, taking every evening - Lipid panel; Future - COMPLETE METABOLIC PANEL WITH GFR; Future  5. 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    Return in about 6 months (around 02/18/2024) for routine follow up, labs prior to visit.  Janene Harvey. [facility] [phone]

## 2023-08-21 ENCOUNTER — Other Ambulatory Visit: Payer: Self-pay | Admitting: Nurse Practitioner

## 2023-08-21 DIAGNOSIS — I10 Essential (primary) hypertension: Secondary | ICD-10-CM

## 2023-08-26 DIAGNOSIS — N8111 Cystocele, midline: Secondary | ICD-10-CM | POA: Diagnosis not present

## 2023-08-26 DIAGNOSIS — N816 Rectocele: Secondary | ICD-10-CM | POA: Diagnosis not present

## 2023-09-03 ENCOUNTER — Other Ambulatory Visit: Payer: Self-pay | Admitting: Student

## 2023-09-03 DIAGNOSIS — I480 Paroxysmal atrial fibrillation: Secondary | ICD-10-CM

## 2023-09-03 NOTE — Telephone Encounter (Signed)
Prescription refill request for Eliquis received. Indication: Afib Last office visit: 07/13/23 Lanna Poche)  Scr: 1.40 (08/17/23) Age: 72 Weight: 93.4kg  Appropriate dose. Refill sent.

## 2023-09-12 ENCOUNTER — Other Ambulatory Visit: Payer: Self-pay | Admitting: Nurse Practitioner

## 2023-09-12 DIAGNOSIS — E785 Hyperlipidemia, unspecified: Secondary | ICD-10-CM

## 2023-09-12 DIAGNOSIS — E1169 Type 2 diabetes mellitus with other specified complication: Secondary | ICD-10-CM

## 2023-10-13 ENCOUNTER — Telehealth: Payer: Self-pay

## 2023-10-13 NOTE — Telephone Encounter (Signed)
 Maria from [facility] call and voicemail to clarify if the patient is diabetic or cardiovascular.  I spoke with Gita Lamb and to verbal verification that the patient has a medical history of congestion heart failure and is a type 2 diabetic.

## 2023-10-27 ENCOUNTER — Other Ambulatory Visit: Payer: Self-pay | Admitting: *Deleted

## 2023-10-27 DIAGNOSIS — E1122 Type 2 diabetes mellitus with diabetic chronic kidney disease: Secondary | ICD-10-CM

## 2023-10-27 MED ORDER — SITAGLIPTIN PHOSPHATE 50 MG PO TABS: 50.0000 mg | ORAL_TABLET | Freq: Every day | ORAL | 1 refills | Status: DC

## 2023-10-27 NOTE — Telephone Encounter (Signed)
Patient called requesting refill to be sent to [facility] Mail order Refill sent as requested.

## 2023-11-01 ENCOUNTER — Other Ambulatory Visit: Payer: Self-pay | Admitting: Nurse Practitioner

## 2023-11-01 DIAGNOSIS — N1831 Chronic kidney disease, stage 3a: Secondary | ICD-10-CM

## 2023-12-11 ENCOUNTER — Other Ambulatory Visit: Payer: Self-pay | Admitting: Nurse Practitioner

## 2023-12-11 DIAGNOSIS — E1122 Type 2 diabetes mellitus with diabetic chronic kidney disease: Secondary | ICD-10-CM

## 2024-02-14 ENCOUNTER — Other Ambulatory Visit: Payer: Self-pay | Admitting: Internal Medicine

## 2024-02-14 DIAGNOSIS — I480 Paroxysmal atrial fibrillation: Secondary | ICD-10-CM

## 2024-02-14 NOTE — Telephone Encounter (Signed)
 Eliquis  5mg  refill request received. Patient is 73 years old, weight-93.4kg, Crea-1.40 on 08/17/23, Diagnosis-Afib, and last seen by Michaelle Adolphus on 07/13/23. Dose is appropriate based on dosing criteria. Will send in refill to requested pharmacy.

## 2024-02-17 ENCOUNTER — Other Ambulatory Visit: Payer: Medicare HMO

## 2024-02-17 DIAGNOSIS — I10 Essential (primary) hypertension: Secondary | ICD-10-CM

## 2024-02-17 DIAGNOSIS — E7800 Pure hypercholesterolemia, unspecified: Secondary | ICD-10-CM

## 2024-02-17 DIAGNOSIS — E1122 Type 2 diabetes mellitus with diabetic chronic kidney disease: Secondary | ICD-10-CM

## 2024-02-18 ENCOUNTER — Other Ambulatory Visit: Payer: Self-pay | Admitting: Nurse Practitioner

## 2024-02-18 DIAGNOSIS — I10 Essential (primary) hypertension: Secondary | ICD-10-CM

## 2024-02-21 ENCOUNTER — Encounter: Payer: Self-pay | Admitting: Nurse Practitioner

## 2024-02-21 ENCOUNTER — Ambulatory Visit (INDEPENDENT_AMBULATORY_CARE_PROVIDER_SITE_OTHER): Payer: Medicare HMO | Admitting: Nurse Practitioner

## 2024-02-21 VITALS — BP 132/74 | HR 68 | Temp 97.5°F | Ht 66.0 in | Wt 205.4 lb

## 2024-02-21 DIAGNOSIS — I480 Paroxysmal atrial fibrillation: Secondary | ICD-10-CM

## 2024-02-21 DIAGNOSIS — N1831 Chronic kidney disease, stage 3a: Secondary | ICD-10-CM

## 2024-02-21 DIAGNOSIS — E1122 Type 2 diabetes mellitus with diabetic chronic kidney disease: Secondary | ICD-10-CM | POA: Diagnosis not present

## 2024-02-21 DIAGNOSIS — N3281 Overactive bladder: Secondary | ICD-10-CM

## 2024-02-21 DIAGNOSIS — E2839 Other primary ovarian failure: Secondary | ICD-10-CM

## 2024-02-21 DIAGNOSIS — I10 Essential (primary) hypertension: Secondary | ICD-10-CM | POA: Diagnosis not present

## 2024-02-21 DIAGNOSIS — D638 Anemia in other chronic diseases classified elsewhere: Secondary | ICD-10-CM

## 2024-02-21 DIAGNOSIS — E7800 Pure hypercholesterolemia, unspecified: Secondary | ICD-10-CM

## 2024-02-21 DIAGNOSIS — N183 Chronic kidney disease, stage 3 unspecified: Secondary | ICD-10-CM

## 2024-02-21 DIAGNOSIS — L608 Other nail disorders: Secondary | ICD-10-CM

## 2024-02-21 DIAGNOSIS — I429 Cardiomyopathy, unspecified: Secondary | ICD-10-CM

## 2024-02-21 MED ORDER — EMPAGLIFLOZIN 10 MG PO TABS: 10.0000 mg | ORAL_TABLET | Freq: Every day | ORAL | 1 refills | Status: DC

## 2024-02-21 NOTE — Progress Notes (Signed)
 Careteam: Patient Care Team: Verma Gobble, NP as PCP - General (Geriatric Medicine) Verona Goodwill, MD as PCP - Electrophysiology (Cardiology) Oneta Bilberry, OD (Optometry)  PLACE OF SERVICE:  [facility]  Advanced Directive information    Allergies  Allergen Reactions   Codeine Other (See Comments)   Hydrocodone Nausea And Vomiting   Lisinopril      Sick on the stomach    Chief Complaint  Patient presents with   Medical Management of Chronic Issues    Medical Management of Chronic Issues. 6 Month follow up with labs. To discuss need for Eye, Foot, Diabetic Kidney, Covid, Tdap and A1C    HPI:  Discussed the use of AI scribe software for clinical note transcription with the patient, who gave verbal consent to proceed.  History of Present Illness [name] is a 73 year old female with diabetes and chronic kidney disease who presents for a six-month follow-up.  She monitors her blood sugar levels daily, with fasting glucose levels ranging from 95 to 127 mg/dL. Her hemoglobin A1c has increased slightly from 7.2% to 7.4% over the past 6 months. She is currently taking Januvia  50 mg and metformin  500 mg twice daily with meals, experiencing no side effects at this dose. She previously experienced diarrhea at a higher dose of metformin . No episodes of hypoglycemia have occurred.  She maintains a regular exercise routine, engaging in physical activity five days a week for about 45 minutes, including bicycling and walking. She struggles with reducing starch intake in her diet. Adequate hydration is ensured, although she notes increased nocturia due to excessive water intake at night.  She is on Eliquis  for atrial fibrillation and has not noticed any signs of bleeding, such as blood in the stool or abnormal bruising. No issues with constipation are reported.  She continues to take Gemtesa for overactive bladder.   She discusses toenail issues, noting thickening due to  a fungal infection. She has not seen a podiatrist in years and is concerned about the cost. She recalls breaking three toes in the past, which have since healed. She is cautious about visiting nail salons due to her diabetes.   Review of Systems:  Review of Systems  Constitutional:  Negative for chills, fever and weight loss.  HENT:  Negative for tinnitus.   Respiratory:  Negative for cough, sputum production and shortness of breath.   Cardiovascular:  Negative for chest pain, palpitations and leg swelling.  Gastrointestinal:  Negative for abdominal pain, constipation, diarrhea and heartburn.  Genitourinary:  Negative for dysuria, frequency and urgency.  Musculoskeletal:  Negative for back pain, falls, joint pain and myalgias.  Skin: Negative.   Neurological:  Negative for dizziness and headaches.  Psychiatric/Behavioral:  Negative for depression and memory loss. The patient does not have insomnia.     Past Medical History:  Diagnosis Date   Anxiety    Arthritis    osteoarthritis   Breast cancer (HCC) 2008   right, ER/PR -   CHF (congestive heart failure) (HCC)    Depression    Diabetes mellitus    TYPE 2   Gastroparesis    Hx of radiation therapy 04/04/07 to 05/20/07   right breast/6260 cGy   Hypercholesterolemia    Hypertension    Lumbago    Malignant neoplasm of breast (female), unspecified site    Nonspecific elevation of levels of transaminase or lactic acid dehydrogenase (LDH)    Osteoarthrosis, unspecified whether generalized or localized, unspecified site  Regional enteritis of unspecified site    Restless legs syndrome (RLS)    TIA (transient ischemic attack)    Urinary frequency    Uterine prolapse without mention of vaginal wall prolapse    Vitamin deficiency    Past Surgical History:  Procedure Laterality Date   ABDOMINAL HYSTERECTOMY  1997   TAH/BSO, ENDOMETRIAL SARCOMA   ARTHROPLASTY     left knee   BREAST LUMPECTOMY Right 11/17/06   re-excision 01/13/07    CATARACT EXTRACTION Right 08/2019   CATARACT EXTRACTION Left 07/2019   TOTAL KNEE ARTHROPLASTY Left 06/10/2011       TOTAL KNEE ARTHROPLASTY Right 2006, 2009   Dr Watson Hacking   TOTAL KNEE ARTHROPLASTY Left 2008   Dr Watson Hacking   WRIST SURGERY     carpel tunnel   Social History:   reports that she quit smoking about 32 years ago. Her smoking use included cigarettes. She started smoking about 52 years ago. She has a 10 pack-year smoking history. She has never used smokeless tobacco. She reports that she does not drink alcohol and does not use drugs.  Family History  Problem Relation Age of Onset   Diabetes Mother    Diabetes Father    Aneurysm Sister    Arthritis Sister    Diabetes Brother    Heart Problems Brother    Colon cancer Neg Hx     Medications: Patient's Medications  New Prescriptions   EMPAGLIFLOZIN  (JARDIANCE ) 10 MG TABS TABLET    Take 1 tablet (10 mg total) by mouth daily before breakfast.  Previous Medications   ACCU-CHEK SOFTCLIX LANCETS LANCETS    1 each by Other route daily. E11.22   ACETAMINOPHEN  (TYLENOL ) 500 MG TABLET    Take 1,000 mg by mouth as needed.   ALCOHOL SWABS  (DROPSAFE ALCOHOL PREP) 70 % PADS    USE AS DIRECTED   AMLODIPINE  (NORVASC ) 10 MG TABLET    TAKE 1 TABLET BY MOUTH EVERY DAY   APIXABAN  (ELIQUIS ) 5 MG TABS TABLET    TAKE 1 TABLET TWICE DAILY   ASCORBIC ACID  (VITAMIN C) 500 MG TABLET    Take 1 tablet by mouth daily.   BLOOD GLUCOSE MONITORING SUPPL (ACCU-CHEK GUIDE ME) W/DEVICE KIT    1 each by Does not apply route as needed.   CARVEDILOL  (COREG ) 25 MG TABLET    TAKE 1 TABLET TWICE DAILY WITH MEALS   FERROUS SULFATE (IRON) 325 (65 FE) MG TABS    Take by mouth daily.   GLUCOSE BLOOD (ACCU-CHEK GUIDE TEST) TEST STRIP    1 each by Other route daily. E11.22   LOSARTAN  (COZAAR ) 100 MG TABLET    TAKE 1 TABLET EVERY DAY FOR BLOOD PRESSURE   MENTHOL , TOPICAL ANALGESIC, (BIOFREEZE) 4 % GEL    Apply 3 oz topically 3 (three) times daily as needed. Apply to  both knees for pain   METFORMIN  (GLUCOPHAGE ) 500 MG TABLET    TAKE 1 TABLET TWICE DAILY WITH MEALS   POLYETHYLENE GLYCOL (MIRALAX / GLYCOLAX) 17 G PACKET    Take 17 g by mouth as needed.   ROSUVASTATIN  (CRESTOR ) 40 MG TABLET    TAKE 1 TABLET EVERY DAY   SITAGLIPTIN  (JANUVIA ) 50 MG TABLET    Take 1 tablet (50 mg total) by mouth daily.   VIBEGRON (GEMTESA) 75 MG TABS    Take 1 tablet by mouth daily.  Modified Medications   No medications on file  Discontinued Medications   No medications on file  Physical Exam:  Vitals:   02/21/24 0902  BP: 132/74  Pulse: 68  Temp: (!) 97.5 F (36.4 C)  SpO2: 97%  Weight: 205 lb 6.4 oz (93.2 kg)  Height: 5' 6 (1.676 m)   Body mass index is 33.15 kg/m. Wt Readings from Last 3 Encounters:  02/21/24 205 lb 6.4 oz (93.2 kg)  08/20/23 206 lb (93.4 kg)  07/13/23 202 lb (91.6 kg)    Physical Exam Constitutional:      General: She is not in acute distress.    Appearance: She is well-developed. She is not diaphoretic.  HENT:     Head: Normocephalic and atraumatic.     Mouth/Throat:     Pharynx: No oropharyngeal exudate.   Eyes:     Conjunctiva/sclera: Conjunctivae normal.     Pupils: Pupils are equal, round, and reactive to light.    Cardiovascular:     Rate and Rhythm: Normal rate and regular rhythm.     Heart sounds: Normal heart sounds.  Pulmonary:     Effort: Pulmonary effort is normal.     Breath sounds: Normal breath sounds.  Abdominal:     General: Bowel sounds are normal.     Palpations: Abdomen is soft.   Musculoskeletal:        General: No tenderness.     Cervical back: Normal range of motion and neck supple.   Skin:    General: Skin is warm and dry.   Neurological:     Mental Status: She is alert and oriented to person, place, and time. Mental status is at baseline.   Psychiatric:        Mood and Affect: Mood normal.     Labs reviewed: Basic Metabolic Panel: Recent Labs    05/21/23 0827 08/17/23 0819  02/17/24 0824  NA 142 141 140  K 4.1 4.1 4.2  CL 108 106 108  CO2 24 23 22   GLUCOSE 114* 95 110*  BUN 24 27* 31*  CREATININE 1.50* 1.40* 1.56*  CALCIUM  9.5 9.8 9.3   Liver Function Tests: Recent Labs    08/17/23 0819 02/17/24 0824  AST 16 17  ALT 10 11  BILITOT 0.5 0.4  PROT 7.2 6.9   No results for input(s): LIPASE, AMYLASE in the last 8760 hours. No results for input(s): AMMONIA in the last 8760 hours. CBC: Recent Labs    08/17/23 0819 02/17/24 0824  WBC 5.2 4.6  NEUTROABS 3,276 2,912  HGB 11.2* 10.6*  HCT 33.3* 32.4*  MCV 95.1 94.7  PLT 192 182   Lipid Panel: Recent Labs    08/17/23 0819 02/17/24 0824  CHOL 179 172  HDL 90 89  LDLCALC 74 69  TRIG 69 56  CHOLHDL 2.0 1.9   TSH: No results for input(s): TSH in the last 8760 hours. A1C: Lab Results  Component Value Date   HGBA1C 7.4 (H) 02/17/2024     Assessment/Plan  Type 2 diabetes mellitus with stage 3a chronic kidney disease, without long-term current use of insulin  (HCC) Assessment & Plan: Not controlled, add jardiance  to regimen Continues on metformin  500 mg BID (unable to titrate due to diarrhea) and januvia .  Encouraged dietary compliance, routine foot care/monitoring and to keep up with diabetic eye exams through ophthalmology   Orders: -     Empagliflozin ; Take 1 tablet (10 mg total) by mouth daily before breakfast.  Dispense: 30 tablet; Refill: 1 -     Ambulatory referral to Podiatry -     Hemoglobin A1c;  Future  AF (paroxysmal atrial fibrillation) (HCC) Assessment & Plan: Rate controlled on coreg , continues on eliquis  for anticoaguation   Essential hypertension, benign Assessment & Plan: Blood pressure well controlled, goal bp <140/90 Continue current medications and dietary modifications follow metabolic panel   Orders: -     COMPLETE METABOLIC PANEL WITHOUT GFR; Future -     CBC with Differential/Platelet; Future  Pure hypercholesterolemia Assessment &  Plan: Continues on crestor  and dietary modifications   CKD stage 3 due to type 2 diabetes mellitus (HCC) Assessment & Plan: Encourage proper hydration and to avoid NSAIDS (Aleve, Advil, Motrin, Ibuprofen)  Continue to monitor renal function.  Adjust medications if needed  Orders: -     Empagliflozin ; Take 1 tablet (10 mg total) by mouth daily before breakfast.  Dispense: 30 tablet; Refill: 1 -     Microalbumin / creatinine urine ratio -     COMPLETE METABOLIC PANEL WITHOUT GFR; Future  Estrogen deficiency -     DG Bone Density; Future  Overactive bladder Assessment & Plan: Stable on gemtesa   Discoloration and thickening of nails both feet -     Ambulatory referral to Podiatry  Anemia, chronic disease Assessment & Plan: Worsen hgb on recent lab, no signs of bleeding noted.  Continues on iron supplement   Orders: -     CBC with Differential/Platelet; Future  Cardiomyopathy, unspecified type [facility]) Assessment & Plan: Stable, no worsening DOE, chest pain, LE edema.  Followed by cardiology.       Return in about 3 months (around 05/23/2024) for routine follow up, labs prior to visit.  Chonita Gadea K. Denney Fisherman [facility] [phone]

## 2024-02-21 NOTE — Assessment & Plan Note (Signed)
 Not controlled, add jardiance  to regimen Continues on metformin  500 mg BID (unable to titrate due to diarrhea) and januvia .  Encouraged dietary compliance, routine foot care/monitoring and to keep up with diabetic eye exams through ophthalmology

## 2024-02-21 NOTE — Assessment & Plan Note (Signed)
 Stable on gemtesa

## 2024-02-21 NOTE — Assessment & Plan Note (Signed)
 Encourage proper hydration and to avoid NSAIDS (Aleve, Advil, Motrin, Ibuprofen)  Continue to monitor renal function.  Adjust medications if needed

## 2024-02-21 NOTE — Assessment & Plan Note (Signed)
 Worsen hgb on recent lab, no signs of bleeding noted.  Continues on iron supplement

## 2024-02-21 NOTE — Patient Instructions (Addendum)
 To get tdap at local pharmacy   ADD [facility] to current medication- take once daily

## 2024-02-21 NOTE — Assessment & Plan Note (Signed)
 Blood pressure well controlled, goal bp <140/90 Continue current medications and dietary modifications follow metabolic panel

## 2024-02-21 NOTE — Assessment & Plan Note (Signed)
 Continues on crestor  and dietary modifications

## 2024-02-21 NOTE — Assessment & Plan Note (Signed)
 Rate controlled on coreg , continues on eliquis  for anticoaguation

## 2024-02-21 NOTE — Assessment & Plan Note (Signed)
 Stable, no worsening DOE, chest pain, LE edema.  Followed by cardiology.

## 2024-02-22 ENCOUNTER — Ambulatory Visit: Payer: Self-pay | Admitting: Nurse Practitioner

## 2024-02-22 LAB — COMPLETE METABOLIC PANEL WITHOUT GFR
AG Ratio: 1.8 (calc) (ref 1.0–2.5)
ALT: 11 U/L (ref 6–29)
AST: 17 U/L (ref 10–35)
Albumin: 4.4 g/dL (ref 3.6–5.1)
Alkaline phosphatase (APISO): 67 U/L (ref 37–153)
BUN/Creatinine Ratio: 20 (calc) (ref 6–22)
BUN: 31 mg/dL — ABNORMAL HIGH (ref 7–25)
CO2: 22 mmol/L (ref 20–32)
Calcium: 9.3 mg/dL (ref 8.6–10.4)
Chloride: 108 mmol/L (ref 98–110)
Creat: 1.56 mg/dL — ABNORMAL HIGH (ref 0.60–1.00)
Globulin: 2.5 g/dL (ref 1.9–3.7)
Glucose, Bld: 110 mg/dL — ABNORMAL HIGH (ref 65–99)
Potassium: 4.2 mmol/L (ref 3.5–5.3)
Sodium: 140 mmol/L (ref 135–146)
Total Bilirubin: 0.4 mg/dL (ref 0.2–1.2)
Total Protein: 6.9 g/dL (ref 6.1–8.1)

## 2024-02-22 LAB — CBC WITH DIFFERENTIAL/PLATELET
Absolute Lymphocytes: 1155 {cells}/uL (ref 850–3900)
Absolute Monocytes: 423 {cells}/uL (ref 200–950)
Basophils Absolute: 9 {cells}/uL (ref 0–200)
Basophils Relative: 0.2 %
Eosinophils Absolute: 101 {cells}/uL (ref 15–500)
Eosinophils Relative: 2.2 %
HCT: 32.4 % — ABNORMAL LOW (ref 35.0–45.0)
Hemoglobin: 10.6 g/dL — ABNORMAL LOW (ref 11.7–15.5)
MCH: 31 pg (ref 27.0–33.0)
MCHC: 32.7 g/dL (ref 32.0–36.0)
MCV: 94.7 fL (ref 80.0–100.0)
MPV: 10.5 fL (ref 7.5–12.5)
Monocytes Relative: 9.2 %
Neutro Abs: 2912 {cells}/uL (ref 1500–7800)
Neutrophils Relative %: 63.3 %
Platelets: 182 10*3/uL (ref 140–400)
RBC: 3.42 10*6/uL — ABNORMAL LOW (ref 3.80–5.10)
RDW: 14.8 % (ref 11.0–15.0)
Total Lymphocyte: 25.1 %
WBC: 4.6 10*3/uL (ref 3.8–10.8)

## 2024-02-22 LAB — TEST AUTHORIZATION

## 2024-02-22 LAB — LIPID PANEL
Cholesterol: 172 mg/dL (ref <200)
HDL: 89 mg/dL (ref >=50)
LDL Cholesterol (Calc): 69 mg/dL
Non-HDL Cholesterol (Calc): 83 mg/dL (ref <130)
Total CHOL/HDL Ratio: 1.9 (calc) (ref <5.0)
Triglycerides: 56 mg/dL (ref <150)

## 2024-02-22 LAB — IRON,TIBC AND FERRITIN PANEL
%SAT: 27 % (ref 16–45)
Ferritin: 83 ng/mL (ref 16–288)
Iron: 71 ug/dL (ref 45–160)
TIBC: 266 ug/dL (ref 250–450)

## 2024-02-22 LAB — MICROALBUMIN / CREATININE URINE RATIO
Creatinine, Urine: 173 mg/dL (ref 20–275)
Microalb Creat Ratio: 36 mg/g{creat} — ABNORMAL HIGH (ref <30)
Microalb, Ur: 6.2 mg/dL

## 2024-02-22 LAB — HEMOGLOBIN A1C
Hgb A1c MFr Bld: 7.4 % — ABNORMAL HIGH (ref <5.7)
Mean Plasma Glucose: 166 mg/dL
eAG (mmol/L): 9.2 mmol/L

## 2024-02-25 ENCOUNTER — Other Ambulatory Visit: Payer: Self-pay | Admitting: Nurse Practitioner

## 2024-03-01 ENCOUNTER — Other Ambulatory Visit: Payer: Self-pay | Admitting: Nurse Practitioner

## 2024-03-01 DIAGNOSIS — Z1231 Encounter for screening mammogram for malignant neoplasm of breast: Secondary | ICD-10-CM

## 2024-03-08 ENCOUNTER — Other Ambulatory Visit: Payer: Self-pay

## 2024-03-08 DIAGNOSIS — E1122 Type 2 diabetes mellitus with diabetic chronic kidney disease: Secondary | ICD-10-CM

## 2024-03-08 DIAGNOSIS — N1831 Chronic kidney disease, stage 3a: Secondary | ICD-10-CM

## 2024-03-08 MED ORDER — GEMTESA 75 MG PO TABS: 1.0000 | ORAL_TABLET | Freq: Every day | ORAL | 0 refills | Status: DC

## 2024-03-08 MED ORDER — EMPAGLIFLOZIN 10 MG PO TABS: 10.0000 mg | ORAL_TABLET | Freq: Every day | ORAL | 1 refills | Status: DC

## 2024-03-08 NOTE — Telephone Encounter (Signed)
 Patient has request refill on medication Jardiance . Patient medication last refilled 02/21/2024 with 30 tablets and 1 refill. Medication sent to provider for further refills. Patient also request medication Gemtesa. Medication last refilled 10/27/2021. Pend and sent to PCP Caro Harlene POUR, NP

## 2024-03-20 ENCOUNTER — Other Ambulatory Visit: Payer: Self-pay

## 2024-03-20 DIAGNOSIS — N183 Chronic kidney disease, stage 3 unspecified: Secondary | ICD-10-CM

## 2024-03-20 DIAGNOSIS — I10 Essential (primary) hypertension: Secondary | ICD-10-CM

## 2024-03-20 DIAGNOSIS — E1122 Type 2 diabetes mellitus with diabetic chronic kidney disease: Secondary | ICD-10-CM

## 2024-03-20 MED ORDER — AMLODIPINE BESYLATE 10 MG PO TABS: 10.0000 mg | ORAL_TABLET | Freq: Every day | ORAL | 3 refills | Status: AC

## 2024-03-20 MED ORDER — GEMTESA 75 MG PO TABS
1.0000 | ORAL_TABLET | Freq: Every day | ORAL | 3 refills | Status: AC
Start: 2024-03-20 — End: ?

## 2024-03-20 MED ORDER — EMPAGLIFLOZIN 10 MG PO TABS: 10.0000 mg | ORAL_TABLET | Freq: Every day | ORAL | 3 refills | Status: AC

## 2024-03-22 ENCOUNTER — Ambulatory Visit (INDEPENDENT_AMBULATORY_CARE_PROVIDER_SITE_OTHER): Admitting: Podiatry

## 2024-03-22 ENCOUNTER — Encounter: Payer: Self-pay | Admitting: Podiatry

## 2024-03-22 DIAGNOSIS — M79674 Pain in right toe(s): Secondary | ICD-10-CM

## 2024-03-22 DIAGNOSIS — M79675 Pain in left toe(s): Secondary | ICD-10-CM

## 2024-03-22 DIAGNOSIS — N183 Chronic kidney disease, stage 3 unspecified: Secondary | ICD-10-CM

## 2024-03-22 DIAGNOSIS — N1831 Chronic kidney disease, stage 3a: Secondary | ICD-10-CM

## 2024-03-22 DIAGNOSIS — B351 Tinea unguium: Secondary | ICD-10-CM | POA: Diagnosis not present

## 2024-03-22 DIAGNOSIS — E1122 Type 2 diabetes mellitus with diabetic chronic kidney disease: Secondary | ICD-10-CM

## 2024-03-22 NOTE — Progress Notes (Signed)
This patient presents to the office with chief complaint of long thick nails and diabetic feet.  This patient  says there  is  no pain and discomfort in their feet.  This patient says there are long thick painful nails.  These nails are painful walking and wearing shoes.  Patient has no history of infection or drainage from both feet.  Patient is unable to  self treat his own nails . This patient presents  to the office today for treatment of the  long nails and a foot evaluation due to history of  diabetes.  General Appearance  Alert, conversant and in no acute stress.  Vascular  Dorsalis pedis and posterior tibial  pulses are palpable  bilaterally.  Capillary return is within normal limits  bilaterally. Temperature is within normal limits  bilaterally.  Neurologic  Senn-Weinstein monofilament wire test within normal limits  bilaterally. Muscle power within normal limits bilaterally.  Nails Thick disfigured discolored nails with subungual debris  from hallux to fifth toes bilaterally. No evidence of bacterial infection or drainage bilaterally.  Orthopedic  No limitations of motion of motion feet .  No crepitus or effusions noted.  No bony pathology or digital deformities noted.  Skin  normotropic skin with no porokeratosis noted bilaterally.  No signs of infections or ulcers noted.     Onychomycosis  Diabetes with no foot complications  IE  Debride nails x 10.  A diabetic foot exam was performed and there is no evidence of any vascular or neurologic pathology.   RTC 3 months.   Gardiner Barefoot DPM

## 2024-03-27 ENCOUNTER — Ambulatory Visit
Admission: RE | Admit: 2024-03-27 | Discharge: 2024-03-27 | Disposition: A | Discharge status: Home / Self Care | Source: Ambulatory Visit | Attending: Nurse Practitioner | Admitting: Nurse Practitioner

## 2024-03-27 DIAGNOSIS — Z1231 Encounter for screening mammogram for malignant neoplasm of breast: Secondary | ICD-10-CM | POA: Diagnosis not present

## 2024-03-28 DIAGNOSIS — N816 Rectocele: Secondary | ICD-10-CM | POA: Diagnosis not present

## 2024-03-28 DIAGNOSIS — N8111 Cystocele, midline: Secondary | ICD-10-CM | POA: Diagnosis not present

## 2024-03-31 ENCOUNTER — Other Ambulatory Visit: Payer: Self-pay | Admitting: Nurse Practitioner

## 2024-03-31 DIAGNOSIS — N1831 Chronic kidney disease, stage 3a: Secondary | ICD-10-CM

## 2024-04-05 ENCOUNTER — Ambulatory Visit (HOSPITAL_BASED_OUTPATIENT_CLINIC_OR_DEPARTMENT_OTHER)
Admission: RE | Admit: 2024-04-05 | Discharge: 2024-04-05 | Disposition: A | Discharge status: Home / Self Care | Source: Ambulatory Visit | Attending: Nurse Practitioner | Admitting: Nurse Practitioner

## 2024-04-05 DIAGNOSIS — E2839 Other primary ovarian failure: Secondary | ICD-10-CM | POA: Diagnosis not present

## 2024-04-05 DIAGNOSIS — M85851 Other specified disorders of bone density and structure, right thigh: Secondary | ICD-10-CM | POA: Diagnosis not present

## 2024-04-05 DIAGNOSIS — Z780 Asymptomatic menopausal state: Secondary | ICD-10-CM | POA: Diagnosis not present

## 2024-04-05 DIAGNOSIS — M85852 Other specified disorders of bone density and structure, left thigh: Secondary | ICD-10-CM | POA: Diagnosis not present

## 2024-04-07 DIAGNOSIS — C50911 Malignant neoplasm of unspecified site of right female breast: Secondary | ICD-10-CM | POA: Diagnosis not present

## 2024-04-17 ENCOUNTER — Other Ambulatory Visit: Payer: Self-pay | Admitting: Nurse Practitioner

## 2024-05-15 LAB — HM DIABETES EYE EXAM

## 2024-05-17 DIAGNOSIS — E119 Type 2 diabetes mellitus without complications: Secondary | ICD-10-CM | POA: Diagnosis not present

## 2024-05-26 ENCOUNTER — Other Ambulatory Visit

## 2024-05-26 DIAGNOSIS — I10 Essential (primary) hypertension: Secondary | ICD-10-CM

## 2024-05-26 DIAGNOSIS — E1122 Type 2 diabetes mellitus with diabetic chronic kidney disease: Secondary | ICD-10-CM

## 2024-05-26 DIAGNOSIS — N183 Chronic kidney disease, stage 3 unspecified: Secondary | ICD-10-CM | POA: Diagnosis not present

## 2024-05-26 DIAGNOSIS — N1831 Chronic kidney disease, stage 3a: Secondary | ICD-10-CM

## 2024-05-26 DIAGNOSIS — D638 Anemia in other chronic diseases classified elsewhere: Secondary | ICD-10-CM | POA: Diagnosis not present

## 2024-05-27 LAB — CBC WITH DIFFERENTIAL/PLATELET
Absolute Lymphocytes: 1570 {cells}/uL (ref 850–3900)
Absolute Monocytes: 452 {cells}/uL (ref 200–950)
Basophils Absolute: 0 {cells}/uL (ref 0–200)
Basophils Relative: 0 %
Eosinophils Absolute: 42 {cells}/uL (ref 15–500)
Eosinophils Relative: 0.8 %
HCT: 35.1 % (ref 35.0–45.0)
Hemoglobin: 11.3 g/dL — ABNORMAL LOW (ref 11.7–15.5)
MCH: 29.8 pg (ref 27.0–33.0)
MCHC: 32.2 g/dL (ref 32.0–36.0)
MCV: 92.6 fL (ref 80.0–100.0)
MPV: 11.2 fL (ref 7.5–12.5)
Monocytes Relative: 8.7 %
Neutro Abs: 3136 {cells}/uL (ref 1500–7800)
Neutrophils Relative %: 60.3 %
Platelets: 189 Thousand/uL (ref 140–400)
RBC: 3.79 Million/uL — ABNORMAL LOW (ref 3.80–5.10)
RDW: 14.4 % (ref 11.0–15.0)
Total Lymphocyte: 30.2 %
WBC: 5.2 Thousand/uL (ref 3.8–10.8)

## 2024-05-27 LAB — COMPREHENSIVE METABOLIC PANEL WITH GFR
AG Ratio: 1.6 (calc) (ref 1.0–2.5)
ALT: 12 U/L (ref 6–29)
AST: 16 U/L (ref 10–35)
Albumin: 4.4 g/dL (ref 3.6–5.1)
Alkaline phosphatase (APISO): 69 U/L (ref 37–153)
BUN/Creatinine Ratio: 16 (calc) (ref 6–22)
BUN: 28 mg/dL — ABNORMAL HIGH (ref 7–25)
CO2: 21 mmol/L (ref 20–32)
Calcium: 9.4 mg/dL (ref 8.6–10.4)
Chloride: 108 mmol/L (ref 98–110)
Creat: 1.7 mg/dL — ABNORMAL HIGH (ref 0.60–1.00)
Globulin: 2.7 g/dL (ref 1.9–3.7)
Glucose, Bld: 94 mg/dL (ref 65–99)
Potassium: 3.9 mmol/L (ref 3.5–5.3)
Sodium: 141 mmol/L (ref 135–146)
Total Bilirubin: 0.4 mg/dL (ref 0.2–1.2)
Total Protein: 7.1 g/dL (ref 6.1–8.1)
eGFR: 31 mL/min/1.73m2 — ABNORMAL LOW (ref >=60)

## 2024-05-27 LAB — HEMOGLOBIN A1C
Hgb A1c MFr Bld: 7.4 % — ABNORMAL HIGH (ref <5.7)
Mean Plasma Glucose: 166 mg/dL
eAG (mmol/L): 9.2 mmol/L

## 2024-05-29 ENCOUNTER — Ambulatory Visit (INDEPENDENT_AMBULATORY_CARE_PROVIDER_SITE_OTHER): Admitting: Nurse Practitioner

## 2024-05-29 ENCOUNTER — Encounter: Payer: Self-pay | Admitting: Nurse Practitioner

## 2024-05-29 VITALS — BP 124/70 | HR 67 | Temp 97.1°F | Ht 66.0 in | Wt 205.0 lb

## 2024-05-29 DIAGNOSIS — E7800 Pure hypercholesterolemia, unspecified: Secondary | ICD-10-CM

## 2024-05-29 DIAGNOSIS — E1122 Type 2 diabetes mellitus with diabetic chronic kidney disease: Secondary | ICD-10-CM | POA: Diagnosis not present

## 2024-05-29 DIAGNOSIS — I480 Paroxysmal atrial fibrillation: Secondary | ICD-10-CM

## 2024-05-29 DIAGNOSIS — N1831 Chronic kidney disease, stage 3a: Secondary | ICD-10-CM

## 2024-05-29 DIAGNOSIS — Z23 Encounter for immunization: Secondary | ICD-10-CM

## 2024-05-29 DIAGNOSIS — I10 Essential (primary) hypertension: Secondary | ICD-10-CM | POA: Diagnosis not present

## 2024-05-29 DIAGNOSIS — N3281 Overactive bladder: Secondary | ICD-10-CM

## 2024-05-29 MED ORDER — RYBELSUS 7 MG PO TABS: 7.0000 mg | ORAL_TABLET | Freq: Every day | ORAL | 2 refills | Status: DC

## 2024-05-29 NOTE — Progress Notes (Signed)
 PRIOR AUTHORIZATION   PLEASE SEE MEDIA TAB FOR PRIOR AUTHORIZATION STATUS  PENDING.....  Ronal Mclean (KeyBETHA ROBINS) Rx #: [id number] Rybelsus  7MG  tablets Form [employer] Electronic PA Form

## 2024-05-29 NOTE — Patient Instructions (Addendum)
 1.) Visit your local pharmacy to receive your covid booster.   Stop metformin   Start rybelsus  when you stop metformin    Will start rybelsus  3 mg daily for 1 month then increase to 7 mg daily

## 2024-05-29 NOTE — Progress Notes (Signed)
 Careteam: Patient Care Team: Caro Harlene POUR, NP as PCP - General (Geriatric Medicine) Fernande Elspeth BROCKS, MD as PCP - Electrophysiology (Cardiology) Gwenith Fairy FALCON, OD (Optometry)  PLACE OF SERVICE:  [facility]  Advanced Directive information    Allergies  Allergen Reactions   Codeine Other (See Comments)   Hydrocodone Nausea And Vomiting   Lisinopril      Sick on the stomach    Chief Complaint  Patient presents with   Medical Management of Chronic Issues    3 month follow-up. Patient with at home blood sugar readings. Foot exam today. Patient c/o of shakiness in hands. Flu vaccine today. Covid booster at local pharmacy     HPI:   Patient is a 73 y.o. female seen in today for a 3 month follow up visit.   She has provided a 3 month reading of her fasting glucose levels. Glucose ranges from 86-144. Her A1C remain at 7.4% since June 2025. She is currently taking Metformin  500mg  BID and Jardiance  10mg  daily. She endorses adherence to medications and no side effects.  She denies numbness and tingling in lower extremities. Reports some swelling at night in lower legs. She does not like to use compression stockings. She has seen the podiatrist where she reports her nails were filed down. She is to follow up in 3 months. No further concerns addressed at visit.   She does complaints of low energy throughout the day that she has noticed for 5-6 months. She denies chest pain and shortness of breath. She has not been checking her BP due to needing a new cuff.  She is active daily. She goes to the gym 5 days a week where she cycles 4-5 miles a day and lifts weights on Friday. She has purchased new shoes that have helped with activity. Reports her worst issue is pasta, that she eats weekly. She also drinks a lot of water primarily a night.   She continues to take her Eliquis  for atrial fibliration, no abnormal bleeding or bruising noted. She also continues her Gemtesa  for over active  bladder.    Review of Systems:  Review of Systems  Constitutional:  Positive for malaise/fatigue. Negative for chills, diaphoresis, fever and weight loss.  HENT:  Negative for congestion, ear discharge, hearing loss and tinnitus.   Eyes:  Negative for blurred vision, double vision, photophobia and redness.  Respiratory:  Negative for cough, shortness of breath and wheezing.   Cardiovascular:  Positive for leg swelling. Negative for chest pain, palpitations, orthopnea, claudication and PND.  Gastrointestinal:  Negative for abdominal pain, blood in stool, constipation, diarrhea, melena, nausea and vomiting.  Genitourinary:  Negative for dysuria, flank pain, frequency, hematuria and urgency.  Musculoskeletal:  Negative for back pain, falls, joint pain, myalgias and neck pain.  Skin:  Negative for itching and rash.  Neurological:  Negative for dizziness, tingling, tremors, sensory change, weakness and headaches.  Endo/Heme/Allergies:  Negative for polydipsia. Does not bruise/bleed easily.  Psychiatric/Behavioral:  Negative for depression, substance abuse and suicidal ideas. The patient is not nervous/anxious.     Past Medical History:  Diagnosis Date   Anxiety    Arthritis    osteoarthritis   Breast cancer (HCC) 2008   right, ER/PR -   CHF (congestive heart failure) (HCC)    Depression    Diabetes mellitus    TYPE 2   Gastroparesis    Hx of radiation therapy 04/04/07 to 05/20/07   right breast/6260 cGy   Hypercholesterolemia  Hypertension    Lumbago    Malignant neoplasm of breast (female), unspecified site    Nonspecific elevation of levels of transaminase or lactic acid dehydrogenase (LDH)    Osteoarthrosis, unspecified whether generalized or localized, unspecified site    Regional enteritis of unspecified site    Restless legs syndrome (RLS)    TIA (transient ischemic attack)    Urinary frequency    Uterine prolapse without mention of vaginal wall prolapse    Vitamin  deficiency    Past Surgical History:  Procedure Laterality Date   ABDOMINAL HYSTERECTOMY  1997   TAH/BSO, ENDOMETRIAL SARCOMA   ARTHROPLASTY     left knee   BREAST LUMPECTOMY Right 11/17/06   re-excision 01/13/07   CATARACT EXTRACTION Right 08/2019   CATARACT EXTRACTION Left 07/2019   TOTAL KNEE ARTHROPLASTY Left 06/10/2011       TOTAL KNEE ARTHROPLASTY Right 2006, 2009   Dr Jacklin   TOTAL KNEE ARTHROPLASTY Left 2008   Dr Jacklin   WRIST SURGERY     carpel tunnel   Social History:   reports that she quit smoking about 32 years ago. Her smoking use included cigarettes. She started smoking about 52 years ago. She has a 10 pack-year smoking history. She has never used smokeless tobacco. She reports that she does not drink alcohol and does not use drugs.  Family History  Problem Relation Age of Onset   Diabetes Mother    Diabetes Father    Aneurysm Sister    Arthritis Sister    Diabetes Brother    Heart Problems Brother    Colon cancer Neg Hx     Medications: Patient's Medications  New Prescriptions   No medications on file  Previous Medications   ACCU-CHEK SOFTCLIX LANCETS LANCETS    1 each by Other route daily. E11.22   ACETAMINOPHEN  (TYLENOL ) 500 MG TABLET    Take 1,000 mg by mouth as needed.   ALCOHOL SWABS  (DROPSAFE ALCOHOL PREP) 70 % PADS    USE AS DIRECTED   AMLODIPINE  (NORVASC ) 10 MG TABLET    Take 1 tablet (10 mg total) by mouth daily.   APIXABAN  (ELIQUIS ) 5 MG TABS TABLET    TAKE 1 TABLET TWICE DAILY   ASCORBIC ACID  (VITAMIN C) 500 MG TABLET    Take 1 tablet by mouth daily.   BLOOD GLUCOSE MONITORING SUPPL (ACCU-CHEK GUIDE ME) W/DEVICE KIT    1 each by Does not apply route as needed.   CALCIUM  CARBONATE-VIT D-MIN (CALCIUM  1200 PO)    Take 1 tablet by mouth daily.   CARVEDILOL  (COREG ) 25 MG TABLET    TAKE 1 TABLET TWICE DAILY WITH MEALS   CHOLECALCIFEROL (VITAMIN D3) 50 MCG (2000 UT) TABS    Take 1 tablet by mouth daily.   EMPAGLIFLOZIN  (JARDIANCE ) 10 MG TABS TABLET     Take 1 tablet (10 mg total) by mouth daily before breakfast.   FERROUS SULFATE (IRON) 325 (65 FE) MG TABS    Take by mouth daily.   GLUCOSE BLOOD (ACCU-CHEK GUIDE TEST) TEST STRIP    1 each by Other route daily. E11.22   LOSARTAN  (COZAAR ) 100 MG TABLET    TAKE 1 TABLET EVERY DAY FOR BLOOD PRESSURE   MENTHOL , TOPICAL ANALGESIC, (BIOFREEZE) 4 % GEL    Apply 3 oz topically 3 (three) times daily as needed. Apply to both knees for pain   METFORMIN  (GLUCOPHAGE ) 500 MG TABLET    TAKE 1 TABLET TWICE DAILY WITH MEALS  POLYETHYLENE GLYCOL (MIRALAX / GLYCOLAX) 17 G PACKET    Take 17 g by mouth as needed.   ROSUVASTATIN  (CRESTOR ) 40 MG TABLET    TAKE 1 TABLET EVERY DAY   SITAGLIPTIN  (JANUVIA ) 50 MG TABLET    TAKE 1 TABLET EVERY DAY   VIBEGRON  (GEMTESA ) 75 MG TABS    Take 1 tablet (75 mg total) by mouth daily.   VITAMIN B-12 (CYANOCOBALAMIN ) 500 MCG TABLET    Take 500 mcg by mouth daily.  Modified Medications   No medications on file  Discontinued Medications   No medications on file     Vitals:   05/29/24 0835  BP: 124/70  Pulse: 67  Temp: (!) 97.1 F (36.2 C)  SpO2: 99%  Weight: 93 kg  Height: 5' 6 (1.676 m)   Body mass index is 33.09 kg/m. Wt Readings from Last 3 Encounters:  05/29/24 93 kg  02/21/24 93.2 kg  08/20/23 93.4 kg    Physical Exam  Physical Exam Constitutional:      General: She is not in acute distress.    Appearance: She is well-developed. She is not diaphoretic.   HENT:     Head: Normocephalic and atraumatic.     Mouth/Throat:     Pharynx: No oropharyngeal exudate.    Eyes:     Conjunctiva/sclera: Conjunctivae normal.     Pupils: Pupils are equal, round, and reactive to light.      Cardiovascular:     Rate and Rhythm: Normal rate and regular rhythm.     Heart sounds: Normal heart sounds.   Pulmonary:     Effort: Pulmonary effort is normal.     Breath sounds: Normal breath sounds.   Abdominal:     General: Bowel sounds are normal.     Palpations:  Abdomen is soft.    Musculoskeletal:        General: No tenderness.     Cervical back: Normal range of motion and neck supple.    Skin:    General: Skin is warm, dry, and intact.  No erythema, lesions, or ulcerations. No calluses or fungal infection.   Neurological:     Mental Status: She is alert and oriented to person, place, and time. Mental status is at baseline. Pedal sensation intact using monofilament.    Psychiatric:        Mood and Affect: Mood normal.      Labs reviewed: Basic Metabolic Panel: Recent Labs    08/17/23 0819 02/17/24 0824 05/26/24 0813  NA 141 140 141  K 4.1 4.2 3.9  CL 106 108 108  CO2 23 22 21   GLUCOSE 95 110* 94  BUN 27* 31* 28*  CREATININE 1.40* 1.56* 1.70*  CALCIUM  9.8 9.3 9.4   Liver Function Tests: Recent Labs    08/17/23 0819 02/17/24 0824 05/26/24 0813  AST 16 17 16   ALT 10 11 12   BILITOT 0.5 0.4 0.4  PROT 7.2 6.9 7.1   No results for input(s): LIPASE, AMYLASE in the last 8760 hours. No results for input(s): AMMONIA in the last 8760 hours. CBC: Recent Labs    08/17/23 0819 02/17/24 0824 05/26/24 0813  WBC 5.2 4.6 5.2  NEUTROABS 3,276 2,912 3,136  HGB 11.2* 10.6* 11.3*  HCT 33.3* 32.4* 35.1  MCV 95.1 94.7 92.6  PLT 192 182 189   Lipid Panel: Recent Labs    08/17/23 0819 02/17/24 0824  CHOL 179 172  HDL 90 89  LDLCALC 74 69  TRIG 69 56  CHOLHDL 2.0 1.9   TSH: No results for input(s): TSH in the last 8760 hours. A1C: Lab Results  Component Value Date   HGBA1C 7.4 (H) 05/26/2024    Assessment/Plan  1. Immunization due (Primary) - Flu vaccine HIGH DOSE PF(Fluzone Trivalent)  2. Type 2 diabetes mellitus with stage 3a chronic kidney disease, without long-term current use of insulin  (HCC) - Worsening GFR at 31 therefore will stop metformin  since not recommended for GFR <30 - Start semaglutide  (RYBELSUS ) 3mg  daily x 1 month, after 1 month increase to 7mg   Continue to work on dietary modifications -  Routine foot monitoring and diabetic eye exams encourged  - Dietary modifications discussed  - A1C recheck at next visit   3. Essential hypertension, benign - BP well controlled and remains at goal <140/90  - Continue current medications and dietary modifications   4. AF (paroxysmal atrial fibrillation) (HCC) - Well managed with coreg  - Continue eliquis  for anticoagulation   5. Pure hypercholesterolemia - Stable, continue crestor  and dietary modifications   6. Overactive bladder - Stable, continue gemetesa    Follow up 3 months for routine visit, labs to be drawn at this visit.   Return in about 3 months (around 08/28/2024) for routine follow up, labs prior to visit. Chiquasia Morris, FNP-S -I personally was present during the history, physical exam and medical decision-making activities of this service and have verified that the service and findings are accurately documented in the student's note Roselene Gray K. Caro BODILY [facility] [phone]

## 2024-06-19 ENCOUNTER — Encounter: Payer: Self-pay | Admitting: Podiatry

## 2024-06-19 ENCOUNTER — Ambulatory Visit: Admitting: Podiatry

## 2024-06-19 DIAGNOSIS — N1831 Chronic kidney disease, stage 3a: Secondary | ICD-10-CM

## 2024-06-19 DIAGNOSIS — M79675 Pain in left toe(s): Secondary | ICD-10-CM

## 2024-06-19 DIAGNOSIS — E1122 Type 2 diabetes mellitus with diabetic chronic kidney disease: Secondary | ICD-10-CM

## 2024-06-19 DIAGNOSIS — B351 Tinea unguium: Secondary | ICD-10-CM | POA: Diagnosis not present

## 2024-06-19 DIAGNOSIS — M79674 Pain in right toe(s): Secondary | ICD-10-CM

## 2024-06-19 NOTE — Progress Notes (Signed)
 This patient returns to my office for at risk foot care.  This patient requires this care by a professional since this patient will be at risk due to having diabetes and CKD. This patient is unable to cut nails himself since the patient cannot reach her nails.These nails are painful walking and wearing shoes.  She says she injured her fifth toe right foot weeks ago.  She says the swelling has gone down but she requests no treatment on this toe. This patient presents for at risk foot care today.  General Appearance  Alert, conversant and in no acute stress.  Vascular  Dorsalis pedis and posterior tibial  pulses are palpable  bilaterally.  Capillary return is within normal limits  bilaterally. Temperature is within normal limits  bilaterally.  Neurologic  Senn-Weinstein monofilament wire test within normal limits  bilaterally. Muscle power within normal limits bilaterally.  Nails Thick disfigured discolored nails with subungual debris  from hallux to fifth toes bilaterally. No evidence of bacterial infection or drainage bilaterally.  Orthopedic  No limitations of motion  feet .  No crepitus or effusions noted.  No bony pathology or digital deformities noted. Swelling and pain fifth toe right foot.  Skin  normotropic skin with no porokeratosis noted bilaterally.  No signs of infections or ulcers noted.     Onychomycosis  Pain in right toes  Pain in left toes  Consent was obtained for treatment procedures.   Mechanical debridement of nails 1-5  bilaterally performed with a nail nipper.  Filed with dremel without incident.    Return office visit   3 months                   Told patient to return for periodic foot care and evaluation due to potential at risk complications.   Cordella Bold DPM

## 2024-06-26 ENCOUNTER — Telehealth: Payer: Self-pay

## 2024-06-26 NOTE — Telephone Encounter (Signed)
 Incoming fax received from patients pharmacy to initiate a prior authorization for        Rybelsus            .  PA initiated through covermymeds. Key: [id number]  Message from Plan Authorization already on file for this request. Authorization starting on 06/07/2024 and ending on 09/06/2025.

## 2024-06-26 NOTE — Telephone Encounter (Signed)
 Spoke with patient to advise that she has been able to receive Rybelsus  with no issue. Per patient she has received and that the approval is for 5 years.

## 2024-06-30 ENCOUNTER — Encounter: Payer: Medicare HMO | Admitting: Nurse Practitioner

## 2024-07-02 ENCOUNTER — Other Ambulatory Visit: Payer: Self-pay | Admitting: Nurse Practitioner

## 2024-07-02 DIAGNOSIS — E1169 Type 2 diabetes mellitus with other specified complication: Secondary | ICD-10-CM

## 2024-07-05 DIAGNOSIS — E785 Hyperlipidemia, unspecified: Secondary | ICD-10-CM | POA: Diagnosis not present

## 2024-07-05 DIAGNOSIS — N3941 Urge incontinence: Secondary | ICD-10-CM | POA: Diagnosis not present

## 2024-07-05 DIAGNOSIS — M8580 Other specified disorders of bone density and structure, unspecified site: Secondary | ICD-10-CM | POA: Diagnosis not present

## 2024-07-05 DIAGNOSIS — Z8249 Family history of ischemic heart disease and other diseases of the circulatory system: Secondary | ICD-10-CM | POA: Diagnosis not present

## 2024-07-05 DIAGNOSIS — Z833 Family history of diabetes mellitus: Secondary | ICD-10-CM | POA: Diagnosis not present

## 2024-07-05 DIAGNOSIS — E1122 Type 2 diabetes mellitus with diabetic chronic kidney disease: Secondary | ICD-10-CM | POA: Diagnosis not present

## 2024-07-05 DIAGNOSIS — E1142 Type 2 diabetes mellitus with diabetic polyneuropathy: Secondary | ICD-10-CM | POA: Diagnosis not present

## 2024-07-05 DIAGNOSIS — N1832 Chronic kidney disease, stage 3b: Secondary | ICD-10-CM | POA: Diagnosis not present

## 2024-07-05 DIAGNOSIS — D6869 Other thrombophilia: Secondary | ICD-10-CM | POA: Diagnosis not present

## 2024-07-05 DIAGNOSIS — I4891 Unspecified atrial fibrillation: Secondary | ICD-10-CM | POA: Diagnosis not present

## 2024-07-05 DIAGNOSIS — I129 Hypertensive chronic kidney disease with stage 1 through stage 4 chronic kidney disease, or unspecified chronic kidney disease: Secondary | ICD-10-CM | POA: Diagnosis not present

## 2024-07-05 DIAGNOSIS — E54 Ascorbic acid deficiency: Secondary | ICD-10-CM | POA: Diagnosis not present

## 2024-07-05 DIAGNOSIS — E669 Obesity, unspecified: Secondary | ICD-10-CM | POA: Diagnosis not present

## 2024-07-05 DIAGNOSIS — Z91011 Allergy to milk products, unspecified: Secondary | ICD-10-CM | POA: Diagnosis not present

## 2024-07-05 DIAGNOSIS — Z7901 Long term (current) use of anticoagulants: Secondary | ICD-10-CM | POA: Diagnosis not present

## 2024-07-05 DIAGNOSIS — Z6831 Body mass index (BMI) 31.0-31.9, adult: Secondary | ICD-10-CM | POA: Diagnosis not present

## 2024-07-19 ENCOUNTER — Encounter: Payer: Self-pay | Admitting: Nurse Practitioner

## 2024-07-19 ENCOUNTER — Ambulatory Visit (INDEPENDENT_AMBULATORY_CARE_PROVIDER_SITE_OTHER): Payer: Self-pay | Admitting: Nurse Practitioner

## 2024-07-19 VITALS — BP 136/78 | HR 78 | Temp 98.1°F | Resp 14 | Ht 66.0 in | Wt 206.0 lb

## 2024-07-19 DIAGNOSIS — Z0000 Encounter for general adult medical examination without abnormal findings: Secondary | ICD-10-CM | POA: Diagnosis not present

## 2024-07-19 NOTE — Progress Notes (Signed)
 Chief Complaint  Patient presents with   Medicare Wellness     Subjective:   [name] is a 73 y.o. female who presents for a Medicare Annual Wellness Visit.  Allergies (verified) Codeine, Hydrocodone, and Lisinopril    History: Past Medical History:  Diagnosis Date   Anxiety    Arthritis    osteoarthritis   Breast cancer (HCC) 2008   right, ER/PR -   CHF (congestive heart failure) (HCC)    Depression    Diabetes mellitus    TYPE 2   Gastroparesis    Hx of radiation therapy 04/04/07 to 05/20/07   right breast/6260 cGy   Hypercholesterolemia    Hypertension    Lumbago    Malignant neoplasm of breast (female), unspecified site    Nonspecific elevation of levels of transaminase or lactic acid dehydrogenase (LDH)    Osteoarthrosis, unspecified whether generalized or localized, unspecified site    Regional enteritis of unspecified site    Restless legs syndrome (RLS)    TIA (transient ischemic attack)    Urinary frequency    Uterine prolapse without mention of vaginal wall prolapse    Vitamin deficiency    Past Surgical History:  Procedure Laterality Date   ABDOMINAL HYSTERECTOMY  1997   TAH/BSO, ENDOMETRIAL SARCOMA   ARTHROPLASTY     left knee   BREAST LUMPECTOMY Right 11/17/06   re-excision 01/13/07   CATARACT EXTRACTION Right 08/2019   CATARACT EXTRACTION Left 07/2019   TOTAL KNEE ARTHROPLASTY Left 06/10/2011       TOTAL KNEE ARTHROPLASTY Right 2006, 2009   Dr Jacklin   TOTAL KNEE ARTHROPLASTY Left 2008   Dr Jacklin   WRIST SURGERY     carpel tunnel   Family History  Problem Relation Age of Onset   Diabetes Mother    Diabetes Father    Aneurysm Sister    Arthritis Sister    Diabetes Brother    Heart Problems Brother    Colon cancer Neg Hx    Social History   Occupational History   Occupation: [employer]  Tobacco Use   Smoking status: Former    Current packs/day: 0.00    Average packs/day: 0.5 packs/day for 20.0 years (10.0 ttl pk-yrs)     Types: Cigarettes    Start date: 09/08/1971    Quit date: 09/08/1991    Years since quitting: 32.8   Smokeless tobacco: Never  Vaping Use   Vaping status: Never Used  Substance and Sexual Activity   Alcohol use: No   Drug use: No   Sexual activity: Never   Tobacco Counseling Counseling given: Not Answered  SDOH Screenings   Food Insecurity: No Food Insecurity (02/21/2024)  Housing: Unknown (02/21/2024)  Transportation Needs: No Transportation Needs (02/21/2024)  Utilities: Not At Risk (02/21/2024)  Alcohol Screen: Low Risk  (04/29/2018)  Depression (PHQ2-9): Low Risk  (07/19/2024)  Physical Activity: Sufficiently Active (07/19/2024)  Social Connections: Moderately Isolated (07/19/2024)  Stress: No Stress Concern Present (07/19/2024)  Tobacco Use: Medium Risk (07/19/2024)   Depression Screen    07/19/2024    8:16 AM 02/21/2024    9:06 AM 06/25/2023   10:36 AM 05/29/2022    8:58 AM 10/24/2021    8:03 AM 05/27/2021    8:32 AM 05/24/2020    8:16 AM  PHQ 2/9 Scores  PHQ - 2 Score 1 0 0 0 0 0 0     Goals Addressed   None    Visit info / Clinical Intake: Medicare  Wellness Visit Type:: Subsequent Annual Wellness Visit Persons participating in visit:: patient Medicare Wellness Visit Mode:: In-person (required for WTM) Information given by:: patient Interpreter Needed?: No Pre-visit prep was completed: no AWV questionnaire completed by patient prior to visit?: no Living arrangements:: with family/others (Daughter, son , and granddaughter) Patient's Overall Health Status Rating: (!) fair Typical amount of pain: none Does pain affect daily life?: no Are you currently prescribed opioids?: no  Dietary Habits and Nutritional Risks How many meals a day?: 2 Eats fruit and vegetables daily?: yes Most meals are obtained by: preparing own meals In the last 2 weeks, have you had any of the following?: none Diabetic:: (!) yes Any non-healing wounds?: no How often do you check your  BS?: continuous glucose monitor (everyday) Would you like to be referred to a Nutritionist or for Diabetic Management? : no  Functional Status Activities of Daily Living (to include ambulation/medication): Independent Ambulation: Independent Medication Administration: Independent Home Management: Independent Manage your own finances?: yes Primary transportation is: driving Concerns about vision?: no *vision screening is required for WTM* Concerns about hearing?: no  Fall Screening Falls in the past year?: 0 Number of falls in past year: 0 Was there an injury with Fall?: 0 Fall Risk Category Calculator: 0 Patient Fall Risk Level: Low Fall Risk  Fall Risk Patient at Risk for Falls Due to: No Fall Risks Fall risk Follow up: Falls evaluation completed  Home and Transportation Safety: All rugs have non-skid backing?: yes All stairs or steps have railings?: yes Grab bars in the bathtub or shower?: yes Have non-skid surface in bathtub or shower?: yes Good home lighting?: yes Regular seat belt use?: yes Hospital stays in the last year:: no  Cognitive Assessment Difficulty concentrating, remembering, or making decisions? : no Will 6CIT or Mini Cog be Completed: yes What year is it?: 0 points What month is it?: 0 points Give patient an address phrase to remember (5 components): [address], [place], [place] About what time is it?: 0 points Count backwards from 20 to 1: 0 points (20,19,18,17,16,15,14,13,12,11,10,9,8,7,6,5,4,3,2,1) Say the months of the year in reverse: 2 points (dec,nov,oct,august,july,june,may,april,march,feb,jan) Repeat the address phrase from earlier: 0 points 6 CIT Score: 2 points  Advance Directives (For Healthcare) Does Patient Have a Medical Advance Directive?: Yes Does patient want to make changes to medical advance directive?: No - Patient declined Type of Advance Directive: Out of facility DNR (pink MOST or yellow form) Out of facility DNR (pink MOST  or yellow form) in Chart? (Ambulatory ONLY): Yes - validated most recent copy scanned in chart Pre-existing out of facility DNR order (yellow form or pink MOST form): Pink Most/Yellow Form available - Physician notified to receive inpatient order Was copy submitted for scanning?: Yes  Reviewed/Updated  Reviewed/Updated: Reviewed All (Medical, Surgical, Family, Medications, Allergies, Care Teams, Patient Goals)        Objective:    Today's Vitals   07/19/24 0819  BP: 136/78  Pulse: 78  Resp: 14  Temp: 98.1 F (36.7 C)  SpO2: 96%  Weight: 206 lb (93.4 kg)  Height: 5' 6 (1.676 m)   Body mass index is 33.25 kg/m.  Current Medications (verified) Outpatient Encounter Medications as of 07/19/2024  Medication Sig   Accu-Chek Softclix Lancets lancets 1 each by Other route daily. E11.22   acetaminophen  (TYLENOL ) 500 MG tablet Take 1,000 mg by mouth as needed.   Alcohol Swabs  (DROPSAFE ALCOHOL PREP) 70 % PADS USE AS DIRECTED   amLODipine  (NORVASC ) 10 MG  tablet Take 1 tablet (10 mg total) by mouth daily.   apixaban  (ELIQUIS ) 5 MG TABS tablet TAKE 1 TABLET TWICE DAILY   ascorbic acid  (VITAMIN C) 500 MG tablet Take 1 tablet by mouth daily.   Blood Glucose Monitoring Suppl (ACCU-CHEK GUIDE ME) w/Device KIT 1 each by Does not apply route as needed.   Calcium  Carbonate-Vit D-Min (CALCIUM  1200 PO) Take 1 tablet by mouth daily.   carvedilol  (COREG ) 25 MG tablet TAKE 1 TABLET TWICE DAILY WITH MEALS   Cholecalciferol (VITAMIN D3) 50 MCG (2000 UT) TABS Take 1 tablet by mouth daily.   empagliflozin  (JARDIANCE ) 10 MG TABS tablet Take 1 tablet (10 mg total) by mouth daily before breakfast.   Ferrous Sulfate (IRON) 325 (65 Fe) MG TABS Take by mouth daily.   glucose blood (ACCU-CHEK GUIDE TEST) test strip 1 each by Other route daily. E11.22   losartan  (COZAAR ) 100 MG tablet TAKE 1 TABLET EVERY DAY FOR BLOOD PRESSURE   Menthol , Topical Analgesic, (BIOFREEZE) 4 % GEL Apply 3 oz topically 3 (three)  times daily as needed. Apply to both knees for pain   polyethylene glycol (MIRALAX / GLYCOLAX) 17 g packet Take 17 g by mouth as needed.   rosuvastatin  (CRESTOR ) 40 MG tablet TAKE 1 TABLET EVERY DAY   Semaglutide  (RYBELSUS ) 7 MG TABS Take 1 tablet (7 mg total) by mouth daily.   sitaGLIPtin  (JANUVIA ) 50 MG tablet TAKE 1 TABLET EVERY DAY   Vibegron  (GEMTESA ) 75 MG TABS Take 1 tablet (75 mg total) by mouth daily.   vitamin B-12 (CYANOCOBALAMIN ) 500 MCG tablet Take 500 mcg by mouth daily.   No facility-administered encounter medications on file as of 07/19/2024.   Hearing/Vision screen Hearing Screening - Comments:: Hearing good Vision Screening - Comments:: Vision good patient has had eye exam. Immunizations and Health Maintenance Health Maintenance  Topic Date Due   HEMOGLOBIN A1C  11/23/2024   COVID-19 Vaccine (9 - 2025-26 season) 12/01/2024   Colonoscopy  01/17/2025   Diabetic kidney evaluation - Urine ACR  02/20/2025   Mammogram  03/27/2025   OPHTHALMOLOGY EXAM  05/17/2025   Diabetic kidney evaluation - eGFR measurement  05/26/2025   FOOT EXAM  05/29/2025   Medicare Annual Wellness (AWV)  07/19/2025   DEXA SCAN  04/05/2026   DTaP/Tdap/Td (4 - Td or Tdap) 03/03/2034   Pneumococcal Vaccine: 50+ Years  Completed   Influenza Vaccine  Completed   Hepatitis C Screening  Completed   Zoster Vaccines- Shingrix  Completed   Meningococcal B Vaccine  Aged Out        Assessment/Plan:  This is a routine wellness examination for [facility].  Patient Care Team: Caro Harlene POUR, NP as PCP - General (Geriatric Medicine) Fernande Elspeth BROCKS, MD (Inactive) as PCP - Electrophysiology (Cardiology) Gwenith Fairy FALCON, OD (Optometry)  I have personally reviewed and noted the following in the patient's chart:   Medical and social history Use of alcohol, tobacco or illicit drugs  Current medications and supplements including opioid prescriptions. Functional ability and status Nutritional  status Physical activity Advanced directives List of other physicians Hospitalizations, surgeries, and ER visits in previous 12 months Vitals Screenings to include cognitive, depression, and falls Referrals and appointments  No orders of the defined types were placed in this encounter.  In addition, I have reviewed and discussed with patient certain preventive protocols, quality metrics, and best practice recommendations. A written personalized care plan for preventive services as well as general preventive health recommendations were provided  to patient.   Harlene MARLA An, NP   07/19/2024   Return in 1 year (on 07/19/2025) for AWV.  After Visit Summary: (In Person-Printed) AVS printed and given to the patient

## 2024-07-19 NOTE — Patient Instructions (Signed)
 [name],  Thank you for taking the time for your Medicare Wellness Visit. I appreciate your continued commitment to your health goals. Please review the care plan we discussed, and feel free to reach out if I can assist you further.  Please note that Annual Wellness Visits do not include a physical exam. Some assessments may be limited, especially if the visit was conducted virtually. If needed, we may recommend an in-person follow-up with your provider.  Ongoing Care Seeing your primary care provider every 3 to 6 months helps us  monitor your health and provide consistent, personalized care.   Referrals If a referral was made during today's visit and you haven't received any updates within two weeks, please contact the referred provider directly to check on the status.  Recommended Screenings:  Health Maintenance  Topic Date Due   Medicare Annual Wellness Visit  06/24/2024   Hemoglobin A1C  11/23/2024   COVID-19 Vaccine (9 - 2025-26 season) 12/01/2024   Colon Cancer Screening  01/17/2025   Yearly kidney health urinalysis for diabetes  02/20/2025   Breast Cancer Screening  03/27/2025   Eye exam for diabetics  05/17/2025   Yearly kidney function blood test for diabetes  05/26/2025   Complete foot exam   05/29/2025   DEXA scan (bone density measurement)  04/05/2026   DTaP/Tdap/Td vaccine (4 - Td or Tdap) 03/03/2034   Pneumococcal Vaccine for age over 59  Completed   Flu Shot  Completed   Hepatitis C Screening  Completed   Zoster (Shingles) Vaccine  Completed   Meningitis B Vaccine  Aged Out       07/19/2024    8:11 AM  Advanced Directives  Does Patient Have a Medical Advance Directive? Yes  Type of Advance Directive Out of facility DNR (pink MOST or yellow form)  Does patient want to make changes to medical advance directive? No - Patient declined  Pre-existing out of facility DNR order (yellow form or pink MOST form) Pink Most/Yellow Form available - Physician notified  to receive inpatient order    Vision: Annual vision screenings are recommended for early detection of glaucoma, cataracts, and diabetic retinopathy. These exams can also reveal signs of chronic conditions such as diabetes and high blood pressure.  Dental: Annual dental screenings help detect early signs of oral cancer, gum disease, and other conditions linked to overall health, including heart disease and diabetes.  Please see the attached documents for additional preventive care recommendations.

## 2024-08-01 ENCOUNTER — Other Ambulatory Visit: Payer: Self-pay

## 2024-08-01 DIAGNOSIS — I480 Paroxysmal atrial fibrillation: Secondary | ICD-10-CM

## 2024-08-01 MED ORDER — APIXABAN 5 MG PO TABS: 5.0000 mg | ORAL_TABLET | Freq: Two times a day (BID) | ORAL | 0 refills | Status: DC

## 2024-08-01 NOTE — Telephone Encounter (Signed)
 Prescription refill request for Eliquis  received. Indication: Last office visit:needs appt Scr: Age:  Weight:  Prescription refilled

## 2024-08-25 ENCOUNTER — Other Ambulatory Visit: Payer: Self-pay

## 2024-08-25 DIAGNOSIS — N1831 Chronic kidney disease, stage 3a: Secondary | ICD-10-CM

## 2024-08-26 LAB — COMPREHENSIVE METABOLIC PANEL WITH GFR
AG Ratio: 1.4 (calc) (ref 1.0–2.5)
ALT: 15 U/L (ref 6–29)
AST: 19 U/L (ref 10–35)
Albumin: 4.3 g/dL (ref 3.6–5.1)
Alkaline phosphatase (APISO): 83 U/L (ref 37–153)
BUN/Creatinine Ratio: 19 (calc) (ref 6–22)
BUN: 31 mg/dL — ABNORMAL HIGH (ref 7–25)
CO2: 23 mmol/L (ref 20–32)
Calcium: 9.6 mg/dL (ref 8.6–10.4)
Chloride: 109 mmol/L (ref 98–110)
Creat: 1.6 mg/dL — ABNORMAL HIGH (ref 0.60–1.00)
Globulin: 3.1 g/dL (ref 1.9–3.7)
Glucose, Bld: 109 mg/dL (ref 65–139)
Potassium: 4.1 mmol/L (ref 3.5–5.3)
Sodium: 142 mmol/L (ref 135–146)
Total Bilirubin: 0.4 mg/dL (ref 0.2–1.2)
Total Protein: 7.4 g/dL (ref 6.1–8.1)
eGFR: 33 mL/min/1.73m2 — ABNORMAL LOW

## 2024-08-26 LAB — HEMOGLOBIN A1C
Hgb A1c MFr Bld: 7.8 % — ABNORMAL HIGH
Mean Plasma Glucose: 177 mg/dL
eAG (mmol/L): 9.8 mmol/L

## 2024-08-28 ENCOUNTER — Ambulatory Visit (INDEPENDENT_AMBULATORY_CARE_PROVIDER_SITE_OTHER): Payer: Self-pay | Admitting: Nurse Practitioner

## 2024-08-28 ENCOUNTER — Encounter: Payer: Self-pay | Admitting: Nurse Practitioner

## 2024-08-28 VITALS — BP 124/80 | HR 68 | Temp 97.1°F | Ht 66.0 in

## 2024-08-28 DIAGNOSIS — I480 Paroxysmal atrial fibrillation: Secondary | ICD-10-CM | POA: Diagnosis not present

## 2024-08-28 DIAGNOSIS — N3281 Overactive bladder: Secondary | ICD-10-CM

## 2024-08-28 DIAGNOSIS — D638 Anemia in other chronic diseases classified elsewhere: Secondary | ICD-10-CM | POA: Diagnosis not present

## 2024-08-28 DIAGNOSIS — E1122 Type 2 diabetes mellitus with diabetic chronic kidney disease: Secondary | ICD-10-CM | POA: Diagnosis not present

## 2024-08-28 DIAGNOSIS — E1169 Type 2 diabetes mellitus with other specified complication: Secondary | ICD-10-CM | POA: Diagnosis not present

## 2024-08-28 DIAGNOSIS — N1831 Chronic kidney disease, stage 3a: Secondary | ICD-10-CM

## 2024-08-28 DIAGNOSIS — E785 Hyperlipidemia, unspecified: Secondary | ICD-10-CM | POA: Diagnosis not present

## 2024-08-28 DIAGNOSIS — I10 Essential (primary) hypertension: Secondary | ICD-10-CM | POA: Diagnosis not present

## 2024-08-28 DIAGNOSIS — E7800 Pure hypercholesterolemia, unspecified: Secondary | ICD-10-CM

## 2024-08-28 DIAGNOSIS — Z7984 Long term (current) use of oral hypoglycemic drugs: Secondary | ICD-10-CM

## 2024-08-28 MED ORDER — RYBELSUS 14 MG PO TABS: 14.0000 mg | ORAL_TABLET | Freq: Every day | ORAL | 1 refills | Status: AC

## 2024-08-28 NOTE — Progress Notes (Signed)
 "   Careteam: Patient Care Team: Caro Harlene POUR, NP as PCP - General (Geriatric Medicine) Fernande Elspeth BROCKS, MD (Inactive) as PCP - Electrophysiology (Cardiology) Gwenith Fairy FALCON, OD (Optometry)  PLACE OF SERVICE:  [facility]  Advanced Directive information    Allergies[1]  Chief Complaint  Patient presents with   Medical Management of Chronic Issues    3 month follow-up and discuss labs. Fasting blood sugar today was 124.     HPI:  Discussed the use of AI scribe software for clinical note transcription with the patient, who gave verbal consent to proceed.  History of Present Illness [name] is a 73 year old female who presents for a three-month follow-up regarding her diabetes management.  She is currently taking Jardiance  10 mg daily, Rybelsus  7 mg daily, and Januvia  50 mg daily for diabetes. Metformin  was previously discontinued due to concerns about kidney function. Fasting blood sugars range from 112 to 160 mg/dL, and her J8r is 2.1%. No episodes of hypoglycemia.   She has a history of iron deficiency anemia, managed with an iron supplement.   She takes losartan  100 mg daily, carvedilol  25 mg twice a day, and amlodipine  10 mg daily for blood pressure. She experiences swelling in her feet in the afternoons, which improves by morning. She recalls an episode of significant swelling that resolved with elevation. She is on Eliquis  for atrial fibrillation.  She takes Gemtesa  for overactive bladder.     Review of Systems:  Review of Systems  Constitutional:  Negative for chills, fever and weight loss.  HENT:  Negative for tinnitus.   Respiratory:  Negative for cough, sputum production and shortness of breath.   Cardiovascular:  Negative for chest pain, palpitations and leg swelling.  Gastrointestinal:  Negative for abdominal pain, constipation, diarrhea and heartburn.  Genitourinary:  Negative for dysuria, frequency and urgency.  Musculoskeletal:  Negative for  back pain, falls, joint pain and myalgias.  Skin: Negative.   Neurological:  Negative for dizziness and headaches.  Psychiatric/Behavioral:  Negative for depression and memory loss. The patient does not have insomnia.     Past Medical History:  Diagnosis Date   Anxiety    Arthritis    osteoarthritis   Breast cancer (HCC) 2008   right, ER/PR -   CHF (congestive heart failure) (HCC)    Depression    Diabetes mellitus    TYPE 2   Gastroparesis    Hx of radiation therapy 04/04/07 to 05/20/07   right breast/6260 cGy   Hypercholesterolemia    Hypertension    Lumbago    Malignant neoplasm of breast (female), unspecified site    Nonspecific elevation of levels of transaminase or lactic acid dehydrogenase (LDH)    Osteoarthrosis, unspecified whether generalized or localized, unspecified site    Regional enteritis of unspecified site    Restless legs syndrome (RLS)    TIA (transient ischemic attack)    Urinary frequency    Uterine prolapse without mention of vaginal wall prolapse    Vitamin deficiency    Past Surgical History:  Procedure Laterality Date   ABDOMINAL HYSTERECTOMY  1997   TAH/BSO, ENDOMETRIAL SARCOMA   ARTHROPLASTY     left knee   BREAST LUMPECTOMY Right 11/17/06   re-excision 01/13/07   CATARACT EXTRACTION Right 08/2019   CATARACT EXTRACTION Left 07/2019   TOTAL KNEE ARTHROPLASTY Left 06/10/2011       TOTAL KNEE ARTHROPLASTY Right 2006, 2009   Dr Jacklin   TOTAL KNEE  ARTHROPLASTY Left 2008   Dr Jacklin   WRIST SURGERY     carpel tunnel   Social History:   reports that she quit smoking about 32 years ago. Her smoking use included cigarettes. She started smoking about 53 years ago. She has a 10 pack-year smoking history. She has never used smokeless tobacco. She reports that she does not drink alcohol and does not use drugs.  Family History  Problem Relation Age of Onset   Diabetes Mother    Diabetes Father    Aneurysm Sister    Arthritis Sister    Diabetes  Brother    Heart Problems Brother    Colon cancer Neg Hx     Medications: Patient's Medications  New Prescriptions   No medications on file  Previous Medications   ACCU-CHEK SOFTCLIX LANCETS LANCETS    1 each by Other route daily. E11.22   ACETAMINOPHEN  (TYLENOL ) 500 MG TABLET    Take 1,000 mg by mouth as needed.   ALCOHOL SWABS  (DROPSAFE ALCOHOL PREP) 70 % PADS    USE AS DIRECTED   AMLODIPINE  (NORVASC ) 10 MG TABLET    Take 1 tablet (10 mg total) by mouth daily.   APIXABAN  (ELIQUIS ) 5 MG TABS TABLET    Take 1 tablet (5 mg total) by mouth 2 (two) times daily. NEEDS CARDIOLOGY APPT FOR REFILLS, CALL OFFICE [phone].  THANK YOU. TAKE 1 TABLET TWICE DAILY   ASCORBIC ACID  (VITAMIN C) 500 MG TABLET    Take 1 tablet by mouth daily.   BLOOD GLUCOSE MONITORING SUPPL (ACCU-CHEK GUIDE ME) W/DEVICE KIT    1 each by Does not apply route as needed.   CALCIUM  CARBONATE-VIT D-MIN (CALCIUM  1200 PO)    Take 1 tablet by mouth daily.   CARVEDILOL  (COREG ) 25 MG TABLET    TAKE 1 TABLET TWICE DAILY WITH MEALS   CHOLECALCIFEROL (VITAMIN D3) 50 MCG (2000 UT) TABS    Take 1 tablet by mouth daily.   EMPAGLIFLOZIN  (JARDIANCE ) 10 MG TABS TABLET    Take 1 tablet (10 mg total) by mouth daily before breakfast.   FERROUS SULFATE (IRON) 325 (65 FE) MG TABS    Take by mouth daily.   GLUCOSE BLOOD (ACCU-CHEK GUIDE TEST) TEST STRIP    1 each by Other route daily. E11.22   LOSARTAN  (COZAAR ) 100 MG TABLET    TAKE 1 TABLET EVERY DAY FOR BLOOD PRESSURE   MENTHOL , TOPICAL ANALGESIC, (BIOFREEZE) 4 % GEL    Apply 3 oz topically 3 (three) times daily as needed. Apply to both knees for pain   POLYETHYLENE GLYCOL (MIRALAX / GLYCOLAX) 17 G PACKET    Take 17 g by mouth as needed.   ROSUVASTATIN  (CRESTOR ) 40 MG TABLET    TAKE 1 TABLET EVERY DAY   SEMAGLUTIDE  (RYBELSUS ) 7 MG TABS    Take 1 tablet (7 mg total) by mouth daily.   SITAGLIPTIN  (JANUVIA ) 50 MG TABLET    TAKE 1 TABLET EVERY DAY   VIBEGRON  (GEMTESA ) 75 MG TABS    Take 1  tablet (75 mg total) by mouth daily.   VITAMIN B-12 (CYANOCOBALAMIN ) 500 MCG TABLET    Take 500 mcg by mouth daily.  Modified Medications   No medications on file  Discontinued Medications   No medications on file    Physical Exam:  Vitals:   08/28/24 0828  BP: 124/80  Pulse: 68  Temp: (!) 97.1 F (36.2 C)  SpO2: 99%  Height: 5' 6 (1.676 m)  Body mass index is 33.25 kg/m. Wt Readings from Last 3 Encounters:  07/19/24 206 lb (93.4 kg)  05/29/24 205 lb (93 kg)  02/21/24 205 lb 6.4 oz (93.2 kg)    Physical Exam Constitutional:      General: She is not in acute distress.    Appearance: She is well-developed. She is not diaphoretic.  HENT:     Head: Normocephalic and atraumatic.     Mouth/Throat:     Pharynx: No oropharyngeal exudate.  Eyes:     Conjunctiva/sclera: Conjunctivae normal.     Pupils: Pupils are equal, round, and reactive to light.  Cardiovascular:     Rate and Rhythm: Normal rate and regular rhythm.     Heart sounds: Normal heart sounds.  Pulmonary:     Effort: Pulmonary effort is normal.     Breath sounds: Normal breath sounds.  Abdominal:     General: Bowel sounds are normal.     Palpations: Abdomen is soft.  Musculoskeletal:     Cervical back: Normal range of motion and neck supple.     Right lower leg: No edema.     Left lower leg: No edema.  Skin:    General: Skin is warm and dry.  Neurological:     Mental Status: She is alert.  Psychiatric:        Mood and Affect: Mood normal.    Labs reviewed: Basic Metabolic Panel: Recent Labs    02/17/24 0824 05/26/24 0813 08/25/24 0850  NA 140 141 142  K 4.2 3.9 4.1  CL 108 108 109  CO2 22 21 23   GLUCOSE 110* 94 109  BUN 31* 28* 31*  CREATININE 1.56* 1.70* 1.60*  CALCIUM  9.3 9.4 9.6   Liver Function Tests: Recent Labs    02/17/24 0824 05/26/24 0813 08/25/24 0850  AST 17 16 19   ALT 11 12 15   BILITOT 0.4 0.4 0.4  PROT 6.9 7.1 7.4   No results for input(s): LIPASE, AMYLASE in  the last 8760 hours. No results for input(s): AMMONIA in the last 8760 hours. CBC: Recent Labs    02/17/24 0824 05/26/24 0813  WBC 4.6 5.2  NEUTROABS 2,912 3,136  HGB 10.6* 11.3*  HCT 32.4* 35.1  MCV 94.7 92.6  PLT 182 189   Lipid Panel: Recent Labs    02/17/24 0824  CHOL 172  HDL 89  LDLCALC 69  TRIG 56  CHOLHDL 1.9   TSH: No results for input(s): TSH in the last 8760 hours. A1C: Lab Results  Component Value Date   HGBA1C 7.8 (H) 08/25/2024     Assessment/Plan  Assessment & Plan Type 2 diabetes mellitus with diabetic chronic kidney disease Diabetes poorly controlled; A1c 7.8%. Metformin  discontinued due to renal concerns. No hypoglycemia. Insulin  therapy considered if control remains inadequate. - Increased Rybelsus  to 14 mg daily. - Continue Jardiance  10 mg daily, Januvia  50 mg daily. - Referred to endocrinologist. - Advised on Rybelsus  timing (empty stomach, water, wait 30 minutes). - Encouraged dietary changes, increased activity. - Consider insulin  if control does not improve.  Iron deficiency anemia Anemia well-managed.  Continue supplements Follow up cbc with next visit  Essential hypertension Blood pressure well controlled. Reports afternoon pedal edema, resolves by morning. - Continue losartan  100 mg daily, carvedilol  25 mg twice daily, amlodipine  10 mg daily. - Recommended compression socks for edema.  Paroxysmal atrial fibrillation Managed with Eliquis . - Continue Eliquis  as prescribed.  Overactive bladder Symptoms well-managed with current medication. - Continue Gemtesa  as  prescribed.  Hyperlipidemia Continue on crestor  with dietary modifications   Return in about 3 months (around 11/26/2024) for routine follow up.:   Phelicia Dantes K. Caro BODILY [facility] [phone]      [1]  Allergies Allergen Reactions   Codeine Other (See Comments)   Hydrocodone Nausea And Vomiting   Lisinopril      Sick on  the stomach   "

## 2024-09-01 ENCOUNTER — Other Ambulatory Visit: Payer: Self-pay | Admitting: Nurse Practitioner

## 2024-09-01 ENCOUNTER — Other Ambulatory Visit: Payer: Self-pay | Admitting: Student

## 2024-09-01 DIAGNOSIS — I480 Paroxysmal atrial fibrillation: Secondary | ICD-10-CM

## 2024-09-01 DIAGNOSIS — E1122 Type 2 diabetes mellitus with diabetic chronic kidney disease: Secondary | ICD-10-CM

## 2024-09-04 ENCOUNTER — Other Ambulatory Visit: Payer: Self-pay | Admitting: Nurse Practitioner

## 2024-09-04 DIAGNOSIS — N1831 Chronic kidney disease, stage 3a: Secondary | ICD-10-CM

## 2024-09-04 NOTE — Telephone Encounter (Signed)
 Medium risk warning populated when attempting to refill medication  Please review and approval if appropriate   Thanks,  [facility] W/CMA

## 2024-09-04 NOTE — Telephone Encounter (Signed)
 Prescription refill request for Eliquis  received. Indication: A-Fib Last office visit: 07/13/23 Scr: 1.70 05/26/24 Care Everywhere Age: 73 Weight: 93.4 KG Pt has met Parameters. Pt is overdue for followup.

## 2024-09-05 NOTE — Telephone Encounter (Signed)
"  Message routed to scheduling.  "

## 2024-09-18 ENCOUNTER — Encounter: Payer: Self-pay | Admitting: Podiatry

## 2024-09-18 ENCOUNTER — Other Ambulatory Visit: Payer: Self-pay | Admitting: Nurse Practitioner

## 2024-09-18 ENCOUNTER — Ambulatory Visit (INDEPENDENT_AMBULATORY_CARE_PROVIDER_SITE_OTHER): Admitting: Podiatry

## 2024-09-18 DIAGNOSIS — N1831 Type 2 diabetes mellitus with diabetic chronic kidney disease: Secondary | ICD-10-CM

## 2024-09-18 DIAGNOSIS — N183 Chronic kidney disease, stage 3 unspecified: Secondary | ICD-10-CM

## 2024-09-18 DIAGNOSIS — M79674 Pain in right toe(s): Secondary | ICD-10-CM | POA: Diagnosis not present

## 2024-09-18 DIAGNOSIS — E1122 Type 2 diabetes mellitus with diabetic chronic kidney disease: Secondary | ICD-10-CM

## 2024-09-18 DIAGNOSIS — M79675 Pain in left toe(s): Secondary | ICD-10-CM

## 2024-09-18 DIAGNOSIS — B351 Tinea unguium: Secondary | ICD-10-CM

## 2024-09-18 NOTE — Progress Notes (Signed)
This patient returns to my office for at risk foot care.  This patient requires this care by a professional since this patient will be at risk due to having diabetes and CKD.  This patient is unable to cut nails herself since the patient cannot reach her nails.These nails are painful walking and wearing shoes.  This patient presents for at risk foot care today.  General Appearance  Alert, conversant and in no acute stress.  Vascular  Dorsalis pedis and posterior tibial  pulses are palpable  bilaterally.  Capillary return is within normal limits  bilaterally. Temperature is within normal limits  bilaterally.  Neurologic  Senn-Weinstein monofilament wire test within normal limits  bilaterally. Muscle power within normal limits bilaterally.  Nails Thick disfigured discolored nails with subungual debris  from hallux to fifth toes bilaterally. No evidence of bacterial infection or drainage bilaterally.  Orthopedic  No limitations of motion  feet .  No crepitus or effusions noted.  No bony pathology or digital deformities noted.  Skin  normotropic skin with no porokeratosis noted bilaterally.  No signs of infections or ulcers noted.     Onychomycosis  Pain in right toes  Pain in left toes  Consent was obtained for treatment procedures.   Mechanical debridement of nails 1-5  bilaterally performed with a nail nipper.  Filed with dremel without incident.    Return office visit      3 months                Told patient to return for periodic foot care and evaluation due to potential at risk complications.     DPM   

## 2024-09-26 ENCOUNTER — Telehealth: Payer: Self-pay

## 2024-09-26 NOTE — Telephone Encounter (Signed)
 Error

## 2024-10-04 ENCOUNTER — Other Ambulatory Visit: Payer: Self-pay | Admitting: Nurse Practitioner

## 2024-10-04 DIAGNOSIS — E1122 Type 2 diabetes mellitus with diabetic chronic kidney disease: Secondary | ICD-10-CM

## 2024-10-06 ENCOUNTER — Other Ambulatory Visit: Payer: Self-pay | Admitting: Student

## 2024-10-06 DIAGNOSIS — I480 Paroxysmal atrial fibrillation: Secondary | ICD-10-CM

## 2024-11-27 ENCOUNTER — Ambulatory Visit: Admitting: Nurse Practitioner

## 2024-12-18 ENCOUNTER — Ambulatory Visit: Admitting: Podiatry

## 2025-07-23 ENCOUNTER — Ambulatory Visit: Admitting: Nurse Practitioner
